# Patient Record
Sex: Female | Born: 1940 | Race: White | Hispanic: No | Marital: Married | Smoking: Never smoker
Health system: Southern US, Community
[De-identification: ages and names within clinical notes are randomized; demographics above are authoritative.]

## PROBLEM LIST (undated history)

## (undated) DIAGNOSIS — I10 Essential (primary) hypertension: Secondary | ICD-10-CM

## (undated) DIAGNOSIS — E119 Type 2 diabetes mellitus without complications: Secondary | ICD-10-CM

## (undated) DIAGNOSIS — M75100 Unspecified rotator cuff tear or rupture of unspecified shoulder, not specified as traumatic: Secondary | ICD-10-CM

## (undated) DIAGNOSIS — M1990 Unspecified osteoarthritis, unspecified site: Secondary | ICD-10-CM

## (undated) DIAGNOSIS — I350 Nonrheumatic aortic (valve) stenosis: Secondary | ICD-10-CM

## (undated) DIAGNOSIS — J449 Chronic obstructive pulmonary disease, unspecified: Secondary | ICD-10-CM

## (undated) DIAGNOSIS — IMO0002 Reserved for concepts with insufficient information to code with codable children: Secondary | ICD-10-CM

## (undated) DIAGNOSIS — K8020 Calculus of gallbladder without cholecystitis without obstruction: Secondary | ICD-10-CM

## (undated) DIAGNOSIS — E785 Hyperlipidemia, unspecified: Secondary | ICD-10-CM

## (undated) DIAGNOSIS — M48061 Spinal stenosis, lumbar region without neurogenic claudication: Secondary | ICD-10-CM

## (undated) DIAGNOSIS — G5601 Carpal tunnel syndrome, right upper limb: Secondary | ICD-10-CM

## (undated) DIAGNOSIS — L309 Dermatitis, unspecified: Secondary | ICD-10-CM

## (undated) DIAGNOSIS — I639 Cerebral infarction, unspecified: Secondary | ICD-10-CM

## (undated) HISTORY — DX: Type 2 diabetes mellitus without complications: E11.9

## (undated) HISTORY — DX: Essential (primary) hypertension: I10

## (undated) HISTORY — PX: TUBAL LIGATION: SHX77

## (undated) HISTORY — DX: Nonrheumatic aortic (valve) stenosis: I35.0

## (undated) HISTORY — PX: BACK SURGERY: SHX140

## (undated) HISTORY — DX: Unspecified osteoarthritis, unspecified site: M19.90

## (undated) HISTORY — DX: Hyperlipidemia, unspecified: E78.5

## (undated) HISTORY — DX: Unspecified rotator cuff tear or rupture of unspecified shoulder, not specified as traumatic: M75.100

## (undated) HISTORY — PX: JOINT REPLACEMENT: SHX530

## (undated) HISTORY — DX: Reserved for concepts with insufficient information to code with codable children: IMO0002

---

## 2002-07-07 ENCOUNTER — Encounter: Payer: Self-pay | Admitting: Specialist

## 2002-07-12 ENCOUNTER — Observation Stay (HOSPITAL_COMMUNITY): Admission: RE | Admit: 2002-07-12 | Discharge: 2002-07-13 | Payer: Self-pay | Admitting: Specialist

## 2002-09-21 ENCOUNTER — Inpatient Hospital Stay (HOSPITAL_COMMUNITY): Admission: RE | Admit: 2002-09-21 | Discharge: 2002-09-22 | Payer: Self-pay | Admitting: Specialist

## 2007-02-26 ENCOUNTER — Ambulatory Visit (HOSPITAL_COMMUNITY): Admission: RE | Admit: 2007-02-26 | Discharge: 2007-02-26 | Payer: Self-pay | Admitting: Ophthalmology

## 2007-03-10 ENCOUNTER — Ambulatory Visit: Payer: Self-pay | Admitting: Cardiology

## 2007-05-14 ENCOUNTER — Ambulatory Visit: Payer: Self-pay | Admitting: Cardiology

## 2007-05-25 ENCOUNTER — Ambulatory Visit: Payer: Self-pay | Admitting: Cardiology

## 2007-11-04 ENCOUNTER — Ambulatory Visit: Payer: Self-pay | Admitting: Vascular Surgery

## 2007-11-13 ENCOUNTER — Ambulatory Visit (HOSPITAL_COMMUNITY): Admission: RE | Admit: 2007-11-13 | Discharge: 2007-11-13 | Payer: Self-pay | Admitting: Rheumatology

## 2007-12-28 ENCOUNTER — Ambulatory Visit: Payer: Self-pay | Admitting: Cardiology

## 2007-12-31 ENCOUNTER — Encounter: Admission: RE | Admit: 2007-12-31 | Discharge: 2007-12-31 | Payer: Self-pay | Admitting: Rheumatology

## 2008-01-18 ENCOUNTER — Encounter: Admission: RE | Admit: 2008-01-18 | Discharge: 2008-01-18 | Payer: Self-pay | Admitting: Rheumatology

## 2008-02-03 ENCOUNTER — Encounter: Admission: RE | Admit: 2008-02-03 | Discharge: 2008-02-03 | Payer: Self-pay | Admitting: Rheumatology

## 2008-04-14 ENCOUNTER — Encounter: Payer: Self-pay | Admitting: Pulmonary Disease

## 2008-04-26 ENCOUNTER — Encounter: Admission: RE | Admit: 2008-04-26 | Discharge: 2008-04-26 | Payer: Self-pay | Admitting: Neurosurgery

## 2008-07-19 ENCOUNTER — Encounter: Payer: Self-pay | Admitting: Pulmonary Disease

## 2008-08-05 ENCOUNTER — Ambulatory Visit: Payer: Self-pay | Admitting: Cardiology

## 2008-08-05 ENCOUNTER — Encounter: Payer: Self-pay | Admitting: Physician Assistant

## 2008-08-05 ENCOUNTER — Ambulatory Visit (HOSPITAL_COMMUNITY): Admission: RE | Admit: 2008-08-05 | Discharge: 2008-08-05 | Payer: Self-pay | Admitting: Cardiology

## 2008-08-12 ENCOUNTER — Inpatient Hospital Stay (HOSPITAL_BASED_OUTPATIENT_CLINIC_OR_DEPARTMENT_OTHER): Admission: RE | Admit: 2008-08-12 | Discharge: 2008-08-12 | Payer: Self-pay | Admitting: Cardiovascular Disease

## 2008-08-12 ENCOUNTER — Ambulatory Visit: Payer: Self-pay | Admitting: Cardiovascular Disease

## 2008-08-29 ENCOUNTER — Ambulatory Visit: Payer: Self-pay | Admitting: Cardiology

## 2008-09-22 ENCOUNTER — Ambulatory Visit (HOSPITAL_COMMUNITY): Admission: RE | Admit: 2008-09-22 | Discharge: 2008-09-22 | Payer: Self-pay | Admitting: Ophthalmology

## 2008-09-23 ENCOUNTER — Ambulatory Visit: Payer: Self-pay | Admitting: Pulmonary Disease

## 2008-09-23 DIAGNOSIS — R0602 Shortness of breath: Secondary | ICD-10-CM

## 2008-09-26 ENCOUNTER — Ambulatory Visit: Payer: Self-pay | Admitting: Cardiovascular Disease

## 2008-09-26 DIAGNOSIS — C801 Malignant (primary) neoplasm, unspecified: Secondary | ICD-10-CM

## 2008-09-26 DIAGNOSIS — M159 Polyosteoarthritis, unspecified: Secondary | ICD-10-CM | POA: Insufficient documentation

## 2008-09-26 DIAGNOSIS — M719 Bursopathy, unspecified: Secondary | ICD-10-CM

## 2008-09-26 DIAGNOSIS — C4492 Squamous cell carcinoma of skin, unspecified: Secondary | ICD-10-CM | POA: Insufficient documentation

## 2008-09-26 DIAGNOSIS — M1990 Unspecified osteoarthritis, unspecified site: Secondary | ICD-10-CM | POA: Insufficient documentation

## 2008-09-26 DIAGNOSIS — I10 Essential (primary) hypertension: Secondary | ICD-10-CM | POA: Insufficient documentation

## 2008-09-26 DIAGNOSIS — E785 Hyperlipidemia, unspecified: Secondary | ICD-10-CM

## 2008-09-26 DIAGNOSIS — M67919 Unspecified disorder of synovium and tendon, unspecified shoulder: Secondary | ICD-10-CM | POA: Insufficient documentation

## 2008-09-26 DIAGNOSIS — R7309 Other abnormal glucose: Secondary | ICD-10-CM

## 2008-09-27 ENCOUNTER — Telehealth (INDEPENDENT_AMBULATORY_CARE_PROVIDER_SITE_OTHER): Payer: Self-pay | Admitting: *Deleted

## 2008-11-01 ENCOUNTER — Ambulatory Visit: Payer: Self-pay | Admitting: Pulmonary Disease

## 2008-11-30 ENCOUNTER — Encounter: Payer: Self-pay | Admitting: Physician Assistant

## 2009-01-11 ENCOUNTER — Encounter (HOSPITAL_COMMUNITY): Admission: RE | Admit: 2009-01-11 | Discharge: 2009-03-09 | Payer: Self-pay | Admitting: Orthopedic Surgery

## 2009-06-10 HISTORY — PX: CATARACT EXTRACTION: SUR2

## 2009-08-08 ENCOUNTER — Ambulatory Visit: Payer: Self-pay | Admitting: Cardiology

## 2009-08-09 ENCOUNTER — Inpatient Hospital Stay (HOSPITAL_COMMUNITY): Admission: RE | Admit: 2009-08-09 | Discharge: 2009-08-12 | Payer: Self-pay | Admitting: Orthopedic Surgery

## 2010-01-24 ENCOUNTER — Ambulatory Visit (HOSPITAL_COMMUNITY): Admission: RE | Admit: 2010-01-24 | Discharge: 2010-01-24 | Payer: Self-pay | Admitting: Orthopedic Surgery

## 2010-07-19 ENCOUNTER — Other Ambulatory Visit (HOSPITAL_COMMUNITY): Payer: Self-pay | Admitting: Orthopedic Surgery

## 2010-07-19 DIAGNOSIS — M25561 Pain in right knee: Secondary | ICD-10-CM

## 2010-07-30 ENCOUNTER — Ambulatory Visit (HOSPITAL_COMMUNITY)
Admission: RE | Admit: 2010-07-30 | Discharge: 2010-07-30 | Disposition: A | Discharge status: Home / Self Care | Payer: Medicare Other | Source: Ambulatory Visit | Attending: Orthopedic Surgery | Admitting: Orthopedic Surgery

## 2010-07-30 DIAGNOSIS — M25569 Pain in unspecified knee: Secondary | ICD-10-CM | POA: Insufficient documentation

## 2010-07-30 DIAGNOSIS — M25561 Pain in right knee: Secondary | ICD-10-CM

## 2010-07-30 DIAGNOSIS — Z96659 Presence of unspecified artificial knee joint: Secondary | ICD-10-CM | POA: Insufficient documentation

## 2010-07-30 MED ORDER — TECHNETIUM TC 99M MEDRONATE IV KIT
25.0000 | PACK | Freq: Once | INTRAVENOUS | Status: AC | PRN
  Administered 2010-07-30: 25 via INTRAVENOUS

## 2010-08-29 LAB — PROTIME-INR: Prothrombin Time: 13.9 seconds (ref 11.6–15.2)

## 2010-08-29 LAB — CBC
HCT: 39.2 % (ref 36.0–46.0)
Hemoglobin: 12.9 g/dL (ref 12.0–15.0)
MCHC: 32.9 g/dL (ref 30.0–36.0)
MCV: 90.2 fL (ref 78.0–100.0)
RBC: 4.35 MIL/uL (ref 3.87–5.11)
WBC: 4.9 10*3/uL (ref 4.0–10.5)

## 2010-08-29 LAB — URINALYSIS, ROUTINE W REFLEX MICROSCOPIC
Bilirubin Urine: NEGATIVE
Hgb urine dipstick: NEGATIVE
Ketones, ur: NEGATIVE mg/dL
Nitrite: NEGATIVE
Protein, ur: NEGATIVE mg/dL
Specific Gravity, Urine: 1.023 (ref 1.005–1.030)
Urobilinogen, UA: 0.2 mg/dL (ref 0.0–1.0)

## 2010-08-29 LAB — COMPREHENSIVE METABOLIC PANEL
AST: 25 U/L (ref 0–37)
CO2: 26 mEq/L (ref 19–32)
Calcium: 9.3 mg/dL (ref 8.4–10.5)
Chloride: 106 mEq/L (ref 96–112)
Creatinine, Ser: 0.83 mg/dL (ref 0.4–1.2)
GFR calc non Af Amer: >60 mL/min (ref >60)
Glucose, Bld: 113 mg/dL — ABNORMAL HIGH (ref 70–99)
Total Bilirubin: 0.6 mg/dL (ref 0.3–1.2)

## 2010-08-29 LAB — TYPE AND SCREEN: ABO/RH(D): A NEG

## 2010-08-29 LAB — ABO/RH: ABO/RH(D): A NEG

## 2010-08-29 LAB — APTT: aPTT: 27 seconds (ref 24–37)

## 2010-09-02 LAB — BASIC METABOLIC PANEL
BUN: 7 mg/dL (ref 6–23)
CO2: 29 mEq/L (ref 19–32)
CO2: 30 mEq/L (ref 19–32)
Chloride: 104 mEq/L (ref 96–112)
Chloride: 106 mEq/L (ref 96–112)
Creatinine, Ser: 0.81 mg/dL (ref 0.4–1.2)
GFR calc Af Amer: >60 mL/min (ref >60)
Glucose, Bld: 165 mg/dL — ABNORMAL HIGH (ref 70–99)
Potassium: 3.5 mEq/L (ref 3.5–5.1)
Potassium: 3.9 mEq/L (ref 3.5–5.1)
Sodium: 139 mEq/L (ref 135–145)

## 2010-09-02 LAB — CBC
HCT: 25.1 % — ABNORMAL LOW (ref 36.0–46.0)
HCT: 27.6 % — ABNORMAL LOW (ref 36.0–46.0)
HCT: 29.5 % — ABNORMAL LOW (ref 36.0–46.0)
Hemoglobin: 9.1 g/dL — ABNORMAL LOW (ref 12.0–15.0)
MCHC: 32.7 g/dL (ref 30.0–36.0)
MCHC: 33 g/dL (ref 30.0–36.0)
MCHC: 33.1 g/dL (ref 30.0–36.0)
MCV: 89.5 fL (ref 78.0–100.0)
MCV: 89.6 fL (ref 78.0–100.0)
MCV: 91.9 fL (ref 78.0–100.0)
Platelets: 123 10*3/uL — ABNORMAL LOW (ref 150–400)
Platelets: 124 10*3/uL — ABNORMAL LOW (ref 150–400)
RBC: 3.3 MIL/uL — ABNORMAL LOW (ref 3.87–5.11)
RDW: 14.8 % (ref 11.5–15.5)
RDW: 14.8 % (ref 11.5–15.5)
WBC: 5.8 10*3/uL (ref 4.0–10.5)
WBC: 6.2 10*3/uL (ref 4.0–10.5)

## 2010-09-02 LAB — PROTIME-INR: INR: 1.71 — ABNORMAL HIGH (ref 0.00–1.49)

## 2010-09-19 LAB — BASIC METABOLIC PANEL
BUN: 9 mg/dL (ref 6–23)
Chloride: 107 mEq/L (ref 96–112)
Creatinine, Ser: 0.8 mg/dL (ref 0.4–1.2)

## 2010-09-19 LAB — HEMOGLOBIN AND HEMATOCRIT, BLOOD: Hemoglobin: 12.7 g/dL (ref 12.0–15.0)

## 2010-09-20 LAB — POCT I-STAT 3, VENOUS BLOOD GAS (G3P V)
Acid-Base Excess: 2 mmol/L (ref 0.0–2.0)
Acid-Base Excess: 2 mmol/L (ref 0.0–2.0)
Bicarbonate: 26.6 mEq/L — ABNORMAL HIGH (ref 20.0–24.0)
O2 Saturation: 73 %
O2 Saturation: 75 %
TCO2: 27 mmol/L (ref 0–100)
pH, Ven: 7.425 — ABNORMAL HIGH (ref 7.250–7.300)
pO2, Ven: 38 mmHg (ref 30.0–45.0)

## 2010-09-20 LAB — POCT I-STAT 3, ART BLOOD GAS (G3+): Acid-Base Excess: 2 mmol/L (ref 0.0–2.0)

## 2010-10-23 NOTE — Assessment & Plan Note (Signed)
[facility]                       [place] CARDIOLOGY OFFICE NOTE   NAME:Ferdinand, LENORA GOMES                       MRN:          [id number]  DATE:08/05/2008                            DOB:          04/25/1941    Cardiologist previously was Dr. Lewayne Bunting.  She is now established with  Dr. Daleen Squibb.   REASON FOR VISIT:  Dyspnea.   PRIMARY CARE PHYSICIAN:  Dr. Olena Leatherwood.   HISTORY OF PRESENT ILLNESS:  Ms. Rahl is a 70 year old female patient  whom I saw in our [facility] back in December 2008 when she presented  for further evaluation of shortness of breath with exertion.  She had  had a stress test done at [facility] prior to that visit that  demonstrated probably normal LV perfusion with small completely  reversible apical defect associated normal wall motion consistent with  attenuation artifact.  A very small amount of ischemia could not be  ruled out.  At that point, Dr. Andee Lineman recommended proceeding with  elective cardiac catheterization.  However, the patient did not have  insurance at that time and did not want to proceed with this route.  She  was supposed to be seen back in follow-up in several weeks but actually  did not come back in follow-up until July 2009, when she saw Dr.  Diona Browner in the [facility].  He discussed with her the possibility of  proceeding with cardiac catheterization at that time.  However, she  declined.  It should be noted the patient has had pulmonary function  tests that demonstrate possible COPD.  She is actually seeing  Dr.  Cherie Ouch in [place] who evaluated her for COPD.  She has never  been a smoker.  She did work in a Orthoptist for many years.  He noted  that she has mild obstructive lung disease, probably overestimated based  on her spirometry done previously.  The patient tells me that Dr.  Orson Aloe has actually told her in the past that she does not really  have COPD.  He has given her a p.r.n.  inhaler that sounds as though she  is describing albuterol.   She has decided to switch to our [place] clinic due to difficulty in  scheduling appointments in [place].  Today, she notes continued dyspnea  with exertion.  She is getting frustrated by this.  She cannot do  anything without shortness of breath.  She notes NYHA class IIB to III  symptoms.  She sleeps on 2 pillows.  She has done this for many years.  It is uncertain whether not she is describing orthopnea.  She denies any  paroxysmal nocturnal dyspnea.  She has a long history venous stasis  disease.  She had been told in the past that she needs vein stripping.  Her lower extremity edema is stable.  She denies syncope.  She  specifically denies any chest discomfort.  She recently had a chest x-  ray ordered by her primary care physician in [place] that demonstrated an  area of patchy infiltrate which appeared to be new involving the right  lung base on the PA view on July 19, 2008.  The patient says that she  has been on a couple rounds of antibiotics over the last several months  for bronchitis.  She is not certain if she actually did take antibiotics  for the finding on her chest x-ray in early February.  Her last set of  pulmonary function tests done in November 2009 demonstrated mild airflow  obstruction with moderate decrease in DLCO.   PAST MEDICAL HISTORY:  Is as outlined above.  In addition she has:  1. Hypertension.  2. Borderline diabetes mellitus.  3. Dyslipidemia.  She has been intolerant to statins in the past and      has declined therapy.  4. Osteoarthritis, status post partial left knee replacement, total      knee replacement on the right.  5. Degenerative disk disease.  6. History of cataract surgery.  7. History of rotator cuff syndrome bilaterally.  8. Squamous cell carcinoma recently diagnosed by a dermatologist in      [place] on her bilateral lower extremities, treated with excision.   MEDICATIONS:   Fish oil daily, Garlic daily, Vitamin C, Vitamin E,  Vitamin D, Aspirin 81 mg daily, Labetalol 300 mg b.i.d., Prempro 0.25 mg  daily, Quinapril/HCTZ 20/12.5 mg b.i.d.,  Zocor 10mg  - she notes that she takes this only occasionally secondary  to myalgias, Multivitamin daily, Oxycodone p.r.n.   ALLERGIES:  No known drug allergies, except she thinks she had some type  of reaction to an anesthetic during a surgery some years ago.   SOCIAL HISTORY:  She denies any tobacco abuse.   FAMILY HISTORY:  Significant for CAD.  Mother had an MI in her 50s.  Father had an MI in his 67s.  One brother died from an MI at age 62 and  2 other brothers both had MIs in their 33s.   REVIEW OF SYSTEMS:  Please see HPI.  She denies any fevers or chills.  Her cough was initially productive of greenish sputum.  No hemoptysis.  Her cough has been clearing.  She denies melena, hematochezia,  hematuria, dysuria, dysphagia.  Rest of the review of systems are  negative.   PHYSICAL EXAMINATION:  She is a well-nourished, well-developed female no  in distress.  Blood pressure is 130/84, pulse 75, weight 198 pounds.  HEENT:  Normal.  NECK:  Without JVD, without lymphadenopathy.  Carotids without bruits  bilaterally.  CARDIAC:  Normal S1, S2.  Regular rate and rhythm, 1/6 systolic ejection  murmur across right sternal border.  LUNGS:  Clear to auscultation bilaterally soft, nontender.  EXTREMITIES:  Without edema.  NEUROLOGIC:  She is alert and oriented x3.  Cranial nerves II-XII  grossly intact.  SKIN:  Warm and dry.   Electrocardiogram reveals sinus rhythm, rate of 75 normal axis,  nonspecific ST-T wave changes.   DATABASE:  1. Adenosine Cardiolite performed at [facility] March 10, 2007 as outlined above.  2. Echocardiogram performed May 25, 2007 at [facility]      demonstrated mild concentric LVH, LV wall motion contractility      within normal limits, EF 60-65%, mild tricuspid  regurgitation.   ASSESSMENT AND PLAN:  1. Dyspnea on exertion.  This is likely multifactorial.  The patient      has some mild degree of chronic obstructive pulmonary disease.  She      also has some degree of deconditioning secondary to degenerative  disk disease and osteoarthritis.  However, she has had a stress      test in the past with some mild abnormalities and a small amount of      ischemia could not be ruled out.  She certainly has significant      risk factors for coronary artery disease.  Cardiac catheterization      has been recommended to her in the past, but she has declined.  She      is now willing to proceed with cardiac catheterization.  I have      discussed this further with Dr. Daleen Squibb, who agreed with proceeding      with cardiac catheterization.  Therefore, she will be set up for      outpatient cardiac catheterization next week.  We will have her      perform right and left heart catheterization to assess her right      heart pressures and assess for pulmonary hypertension as a cause of      her shortness of breath.  We will obtain fasting lipids with her      blood work and adjust her lipid-lowering medication if needed.  We      will also add a BNP to help assess her shortness of breath.  The      patient will have a follow-up chest x-ray.  If this continues to      demonstrate a right lower lobe infiltrate, she will be treated for      pneumonia prior to proceeding with cardiac catheterization.  2. Hypertension.  This seems to be fairly well controlled.  She will      continue on her above medications.  3. Borderline diabetes mellitus.  She will to follow up with Dr.      Olena Leatherwood.  4. Dyslipidemia.  As outlined above.  We will recheck lipids and make      further adjustments as necessary.  We may need to try something      like pravastatin or Crestor as she is having trouble tolerating      Zocor.   DISPOSITION:  The patient will follow up  postcatheterization.  If her  chest x-ray does demonstrate pneumonia, this will be treated and we will  see her back in follow-up prior to proceeding with cardiac  catheterization.      Tereso Newcomer, PA-C  Electronically Signed      Jesse Sans. Daleen Squibb, MD, [facility]  Electronically Signed   SW/MedQ  DD: 08/05/2008  DT: 08/05/2008  Job #: [id number]   cc:   Lia Hopping

## 2010-10-23 NOTE — Assessment & Plan Note (Signed)
[facility] HEALTHCARE                          [place] CARDIOLOGY OFFICE NOTE   NAME:[name], [name]                       MRN:          [id number]  DATE:05/14/2007                            DOB:          [birth year]    REFERRING PHYSICIAN:  Annette Stable Hasanaj   REASON FOR CONSULTATION:  Shortness of breath.   HISTORY OF PRESENT ILLNESS:  [name] is a 70 year old female patient  who has no history of coronary artery disease, who presents to the  office today for further evaluation of shortness of breath.  The patient  began to notice dyspnea with exertion about 5 months ago.  Prior to  this, she had not experienced any of these symptoms.  Her shortness of  breath is not severe with activity.  It is mainly just a noticeable  symptom that she has never had before.  She denies any associated chest  pain, heaviness, or tightness.  She denies any arm or jaw discomfort.  She denies any associated nausea or diaphoresis.  She denies any syncope  or near-syncope.  She describes NYHA class II to class IIB symptoms.  She denies orthopnea or PND.  She has chronic pedal edema and actually  has stasis dermatitis.  She has seen a dermatologist in the past and has  had an Radio broadcast assistant applied previously.  She has been worked up with her  shortness of breath by her previous primary care physician, Dr. Dimas Aguas.  An adenosine Cardiolite study was performed September 2008, which showed  probable normal LV perfusion with a small completely reversible apical  defect, associated with normal wall motion, consistent with attenuation  artifact, although a very small amount of ischemia could not be ruled  out.  The patient also had pulmonary function testing completed in  October 2008 that revealed Gold stage II COPD with a paroxysmal response  to single-dose aerolized bronchodilator.   PAST MEDICAL HISTORY:  1. Hypertension.  2. Borderline diabetes mellitus.  3. Hyperlipidemia.  The patient  has been prescribed Lipitor in the      past, but has not started on this secondary to concerns over liver      problems related to the medication.  4. COPD.  5. Osteoarthritis.      a.     History of a partial left knee replacement and total knee       replacement on the right.  6. History of cataract surgery recently.  7. History of rotator cuff syndrome bilaterally.  8. Venous insufficiency with stasis dermatitis.   ALLERGIES:  She had some type of reaction to an anesthetic some years  ago when she had her knee surgery.   CURRENT MEDICATIONS:  1. Vaseretic unknown dosage daily.  2. Atenolol 50 mg daily.  3. Aspirin 325 mg daily.  4. Fish oil.  5. Garlic.  6. Arthrotec unknown dosage.  7. Vitamin C.  8. Vitamin E.  9. B12.  10.Inhaler p.r.n.   SOCIAL HISTORY:  The patient denies any history of tobacco or alcohol  abuse.  She is married and has 3 children.  Her  husband has actually  seen Dr. Ladona Ridgel in the past secondary to an arrhythmia and has undergone  ablation therapy.  She is retired.  She worked 25 years in the  [employer].  She also worked Theatre manager for 18  years.   FAMILY HISTORY:  Significant for coronary artery disease.  Her mother  had an MI in her 65s and eventually died at 84 from a myocardial  infarction.  Her father died in his mid 16s from a myocardial  infarction.  She has 1 brother who died at age 63 from a myocardial  infarction, and 2 other brothers who both had Mis in their 62s.  One  brother is now in his 96s with significant congestive heart failure.   REVIEW OF SYSTEMS:  Please see HPI.  She denies fevers or chills.  She  has had a nonproductive cough.  Denies hemoptysis.  Denies dysphagia  sensation.  Denies any melena or hematochezia.  Denies any dysuria or  hematuria.  Denies any monocular blindness, unilateral weakness,  difficulty with speech.  The rest of the review of systems are negative.   PHYSICAL EXAM:  She is a  well-nourished, well-developed female in no  acute distress.  Blood pressure 153/91, pulse 74, weight 209.2 pounds.  HEENT:  Normal.  NECK:  Without JVD.  CARDIAC:  Normal S1 and S2.  Regular rate and rhythm with a 2/6 systolic  ejection murmur heard best at the right upper sternal border.  LUNGS:  Clear to auscultation bilaterally without wheeze, rales, or  rhonchi.  ABDOMEN:  Soft and nontender.  EXTREMITIES:  With 1+ edema bilaterally with chronic stasis dermatitis.  Calves soft and nontender.  SKIN:  Warm and dry.  NEUROLOGIC:  She is alert and oriented x3.  Cranial nerves 2-12 are  grossly intact.  VASCULAR:  Without carotid bruits bilaterally.  No femoral artery bruits  noted bilaterally.  ENDOCRINE:  Without thyromegaly.  LYMPHATICS:  Without lymphadenopathy.   ELECTROCARDIOGRAM:  Reveals sinus rhythm with a heart rate of 75, normal  axis, and nonspecific ST-T wave changes.   IMPRESSION:  1. Dyspnea with exertion.      a.     Equivocal adenosine Cardiolite study September 2008.      b.     Recent diagnosis of Gold stage II chronic obstructive       pulmonary disease.      c.     Rule out anginal equivalent.  2. Systolic murmur.  3. Hypertension.  4. Borderline diabetes mellitus.  5. Hyperlipidemia.      a.     Noncompliant with medical therapy secondary to concerns over       side effects.  6. Strong family history of cad.  7. Venous insufficiency.  8. Obesity.  9. Osteoarthritis.   PLAN:  The patient presents to the office today for further evaluation  of dyspnea on exertion.  Her dyspnea with exertion could be  multifactorial.  She does, however, have significant cardiac risk  factors, including a strong family history, as well as hypertension and  borderline diabetes mellitus.  Her stress test was equivocal.  I had a  long discussion with the patient regarding options, including proceeding  with cardiac catheterization.  She was also interviewed and examined  by  Dr. Andee Lineman today.  My recommendation at this point in time is to proceed  with elective cardiac catheterization.  However, the patient does not  have insurance at this time and would  like to wait until she is able to  use Medicare, which will be March 2009.  Her symptoms are stable at this  point.  Therefore, we plan to:  1. Proceed with medical therapy.  Will initiate Norvasc 5 mg daily for      blood pressure control and as an antianginal.  2. Continue aspirin 325 mg daily.  3. I have given her a prescription for nitroglycerin for recurrent      chest pain.  4. She will need an echocardiogram to further assess her systolic      murmur.  If she has significant valvular abnormalities or left      ventricular dysfunction, she will need cardiac catheterization      urgently.  5. Will check a fasting lipid panel for risk factor modification.  6. Will also set her up for a chest x-ray to rule out occult chest      pathology contributing to her symptoms.  7. She will return in 4 weeks for followup.  8. As noted above, we had a long discussion with the patient today.      It is certainly reasonable to hold off on cardiac catheterization      until it is more feasible for her from a financial standpoint.  Her      symptoms are certainly stable at this point in time, and we will      try an initial trial of medical therapy.  Our plan is to proceed      with cardiac catheterization in the future, once the patient is      able.  She knows that she will require urgent cardiac      catheterization if      she has an abnormal echocardiogram or a change in her symptoms, or      worsening symptoms.      Tereso Newcomer, PA-C  Electronically Signed      Learta Codding, MD,FACC  Electronically Signed   SW/MedQ  DD: 05/14/2007  DT: 05/14/2007  Job #: [id number]   cc:   Lia Hopping

## 2010-10-23 NOTE — Procedures (Signed)
LOWER EXTREMITY VENOUS REFLUX EXAM   INDICATION:  Bilateral lower extremity vascular stasis ulcers which  started following bilateral partial knee replacements in 2003.   EXAM:  Using color-flow imaging and pulse Doppler spectral analysis, the  right and left common femoral, superficial femoral, popliteal, posterior  tibial, greater and lesser saphenous veins are evaluated.  There is no  evidence suggesting deep venous insufficiency in the right or left lower  extremity.   The right and left saphenofemoral junctions are not competent.  The  right and left anterior branches of the greater saphenous veins are not  competent with the caliber as described below.  Only a short portion of  the incompetent greater saphenous veins are within the fascia.  Within  15 cm of the saphenofemoral junction, the incompetent anterior branches  of the greater saphenous vein perforate the fascia superficially and  give rise to a network of tortuous varicosities.   The right proximal short saphenous vein demonstrates incompetency.   GSV Diameter (used if found to be incompetent only)                                            Right    Left  Proximal Greater Saphenous Vein           0.39 cm  0.48 cm  Proximal-to-mid-thigh                     0.36 cm  0.49 cm  Mid thigh                                 cm       cm  Mid-distal thigh                          cm       cm  Distal thigh                              cm       cm  Knee                                      cm       cm   IMPRESSION:  1. Right short saphenous vein measures from 0.60 cm to 0.38 cm from      the distal calf to the saphenopopliteal junction.  2. Right and left greater saphenous vein reflux is identified with the      caliber ranging from 0.39 cm to 0.36 cm from the saphenofemoral      junction to the mid thigh on the right and from 0.48 cm to 0.49 cm      from the saphenofemoral junction  to the mid thigh on the  left.  3. Right and left greater saphenous veins are not aneurysmal.  4. Right and left greater saphenous veins are tortuous.  5. The deep venous system is competent.  6. The right lesser saphenous vein is not competent.  7. Incompetent anterior branches of the greater saphenous vein      penetrate the fascia superficially and give rise to extensive  varicose veins 10-15 cm distal to the saphenofemoral junction      bilaterally.   ___________________________________________  Janetta Hora Fields, MD   MC/MEDQ  D:  11/04/2007  T:  11/04/2007  Job:  [id number]

## 2010-10-23 NOTE — Cardiovascular Report (Signed)
NAME:  [name], [name] NO.:  [id number]   MEDICAL RECORD NO.:  [id number]          PATIENT TYPE:  OIB   LOCATION:  1965                         FACILITY:  [facility]   PHYSICIAN:  Veverly Fells. Excell Seltzer, MD  DATE OF BIRTH:  1940/09/29   DATE OF PROCEDURE:  08/12/2008  DATE OF DISCHARGE:  08/12/2008                            CARDIAC CATHETERIZATION   PROCEDURES:  1. Right heart catheterization.  2. Left heart catheterization.  3. Selective coronary angiography.  4. Left ventricular angiography.   INDICATIONS:  Ms. Cheyney is a 70 year old woman with marked dyspnea.  She  has severe exertional dyspnea that is longstanding.  She has undergone  evaluation with a stress Myoview in the past that showed some mild  abnormalities and ischemia cannot be ruled out.  She was referred for  right and left heart catheterization to evaluate her hemodynamics and  coronary anatomy.   Risks and indications of the procedure were reviewed with the patient.  Informed consent was obtained.  Right groin was prepped, draped, and  anesthetized with 1% lidocaine using modified Seldinger technique.  A 4-  French sheaths was placed in the right femoral artery and a 6-French  sheath was placed in the right femoral vein.  An end-hole multipurpose  catheter was used for the right heart catheterization.  Standard 4-  French Judkins catheters were used for coronary angiography and left  ventriculography.  All catheter exchanges were performed over guidewire.  There were no immediate complications.   FINDINGS:  Left ventriculography:  Normal LV function with an LVEF  estimated at 65%.  There is no mitral regurgitation.   Coronary angiography, left mainstem:  Normal.  Bifurcates into the LAD  and left circumflex.   LAD:  Large-caliber vessel that courses down and reaches the LV apex.  The LAD supplies 3 diagonal branches all of which are normal.  There is  no obstructive disease in the LAD or diagonal  branches.   Left circumflex:  Left circumflex is large caliber courses down and  supplies 3 OM branches.  The second and third OM branches are large.  There is no significant obstructive disease throughout the left  circumflex system.   Right coronary artery:  The right coronary artery is dominant.  It is a  large vessel that courses down and supplies the PDA branch and  posterolateral branch.  There is minimal lumen irregularity proximally  with no significant stenosis.  The remaining portions of the vessel are  smooth with no significant stenosis throughout.   HEMODYNAMIC RESULTS:  Right atrial pressure mean of 7, right ventricular  pressure of 33/10, pulmonary artery pressure 33/14 with a mean of 23,  pulmonary capillary wedge pressures 12, left ventricular pressures  176/11, aortic pressure initially was 159/80 with a mean of 114.  The  patient had an irritable ventricle with the pigtail catheter in the LV,  so the gradient is false.  There was no transaortic valvular gradient  during the procedure.   Oxygen saturations:  Aortic pressure 96%, pulmonary artery 75%, superior  vena cava 73%.   Cardiac output  by the Fick method 6.3 L per minute.  Cardiac index 3.2 L  per minute per meter square.   ASSESSMENT:  1. Normal coronary arteries with the exception of minimal plaque in      the proximal right coronary artery.  2. Normal left ventricular function.  3. Normal hemodynamics.      Veverly Fells. Excell Seltzer, MD  Electronically Signed     MDC/MEDQ  D:  08/12/2008  T:  08/13/2008  Job:  [id number]   cc:   Leanor Kail Wall, MD, [facility]

## 2010-10-23 NOTE — Assessment & Plan Note (Signed)
[facility]                       [place] CARDIOLOGY OFFICE NOTE   NAME:DUNNSiria, Calandro                       MRN:          [id number]  DATE:08/29/2008                            DOB:          Mar 03, 1941    Ms. Osterloh comes in today for followup.  She has a long history of dyspnea  and was evaluated here in the office on August 12, 2008, by [facility]  and myself.   It had been recommended for a number of years for her to have a cardiac  catheterization which she finally consented to.  On August 12, 2008, she  had a right and left heart catheterization.  This showed EF is 65% with  no mitral regurgitation.  She had no significant coronary disease.  She  had some mild pulmonary hypertension by her right heart cath, but her  wedge was only 12.  She was hypertensive in the lab with a left  ventricular systolic pressure of 176.   She comes in today still complaining of her dyspnea.  She has mild COPD  by recent pulmonary function studies with a decreased DLCO.  She wonders  if she needs to see a pulmonologist in our group.  I told her I would be  happy to set it up.   She has multiple other reasons to be short of breath well outlined by  our note on August 05, 2008.   Her meds are unchanged since her last visit.  Please refer to the  previous note.   PHYSICAL EXAMINATION:  VITAL SIGNS:  Her blood pressure is 134/82, her  pulse is 64 and regular, her weight is 199.  HEENT:  Unremarkable.  NECK:  No JVD.  Carotids are full.  LUNGS:  Clear without rhonchi or wheezing.  HEART:  Regular rate and rhythm.  PMI was difficult to appreciate.  There is no gallop.  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  There is no sign of DVT.  Pulses are intact.  NEUROLOGIC:  Intact.   I have answered all Ms. Kwolek questions today.  I have arranged for her  to see [facility] at her request.   Reviewing her chart again, I see  no evidence of workup for pulmonary  embolus.  Because this is a chronic problem, I will not order a D-dimer  today.  However, Pulmonary may wish to pursue  this further.  She also may have had CT scans done in [place] with the  chronicity of this issue.  We will see her back on a p.r.n. basis from a  cardiovascular perspective.     Thomas C. Daleen Squibb, MD, [facility]  Electronically Signed    TCW/MedQ  DD: 08/29/2008  DT: 08/30/2008  Job #: [id number]   cc:   Lia Hopping

## 2010-10-23 NOTE — Assessment & Plan Note (Signed)
[facility]                          [place] CARDIOLOGY OFFICE NOTE   NAME:[name], [name]                       MRN:          [id number]  DATE:12/28/2007                            DOB:          [birth year]    PRIMARY CARE PHYSICIAN:  Dr. Lia Hopping   CARDIOLOGIST:  Learta Codding, MD, [facility].   REASON FOR VISIT:  Followup shortness of breath.   HISTORY OF PRESENT ILLNESS:  [name] was seen by Dr. Andee Lineman and Mr.  Alben Spittle back in December 2008.  Please see the extensive note for full  discussion of the patient's presentation and symptoms.  At that time she  was experiencing dyspnea on exertion that was felt to be potentially  multifactorial.  She did have cardiac risk factors including family  history, hypertension, and borderline diabetes mellitus.  She had  undergone a noninvasive stress test, which was fairly reassuring  demonstrating probable apical soft tissue attenuation with an ejection  fraction of 66%, although the possibility of a very small area of  ischemia could not be excluded.  Echocardiography demonstrated normal  left ventricular systolic function with an ejection fraction of 60-65%  and mild tricuspid regurgitation.  The possibility of a diagnostic  cardiac catheterization done electively was discussed, although the  patient did not have insurance at that time and she did not elect to  proceed.  The plan was for her to return and discuss the matter further  over the next month, although she has not been seen since then and comes  into the office today for a visit.   I do see that she has been diagnosed with moderate obstructive pulmonary  disease based on pulmonary function tests from December.  She had a  paradoxical response to albuterol inhaler.  She has also had lipids  obtained showing an LDL of 132.  She tells me that she was tried on  simvastatin, although did not tolerate this due to myalgias.  She  prefers to take garlic  supplements and red yeast rice extract.  Today,  we revisited the prior discussion regarding cardiac catheterization and  [name] actually states she has felt better in terms of her breathing.  She has NYHA class II dyspnea on exertion.  She has had no exertional  chest pain.  She has also been undergoing some evaluation for back pain  and leg pain related to osteoarthritis and spinal stenosis and has some  injections planned in the near future.  We reviewed the matter today and  discussed a referral to Dr. Orson Aloe for further investigation of her  lung status.  She is not a smoker, but does have a history of working in  [employer] with [employer], so I suspect some element of chronic  lung disease at baseline.  It may well be that her symptoms will improve  with further management of her blood pressure and a standing metered-  dose inhaler regimen.  We did talk about aspirin and the merits of  statin therapy for general risk factor reduction, although she did not  want to  start a statin at this time.   ALLERGIES:  Intolerance to ZOCOR due to myalgias.   PRESENT MEDICATIONS:  1. Vaseretic 20/12.5 mg p.o. daily.  2. Atenolol 50 mg p.o. daily.  3. Omega-3 supplements.  4. Garlic.  5. Red yeast rice extract.  6. She also uses hydrocodone and Excedrin ES for her back pain.   REVIEW OF SYSTEMS:  As described in the history of present illness.  Otherwise negative.   PHYSICAL EXAMINATION:  VITAL SIGNS:  Blood pressure is 155/89, heart  rate is 75, and weight is 202 pounds.  GENERAL:  An overweight woman and in no acute distress.  HEENT:  Conjunctivae is normal.  Oropharynx is clear.  NECK:  Supple.  No elevated jugular venous pressure.  No loud bruits or  thyromegaly.  LUNGS:  Clear although with diminished breath sounds throughout.  No  active wheezing noted.  CARDIAC:  Regular rate and rhythm.  A 2/6 murmur heard at the base.  Second heart sound is preserved.  No S3 gallop or  pericardial rub.  ABDOMEN:  Soft and nontender.  Normoactive bowel sounds.  EXTREMITIES:  Exhibit no frank pitting edema.   IMPRESSION AND RECOMMENDATIONS:  Dyspnea on exertion, likely  multifactorial.  I suspect she has an element of diastolic dysfunction  as well as what appears to be chronic lung disease, perhaps related to  long-term work in a Orth[employer].  She denies any long-standing tobacco  use history.  Her pulmonary function tests were consistent with moderate  obstructive lung disease with a paradoxical response to albuterol.  I  have asked her to start taking coated baby aspirin daily for general  risk factor reduction.  She is not interested in the statin at this  time, although we did discuss it.  I will also make a referral to Dr.  Orson Aloe for formal pulmonary consultation.  Some of her symptoms may  actually improve on a standing pulmonary medical regimen.  Otherwise, we  did revisit the issue of a diagnostic cardiac catheterization, although  at this point she would prefer observation and may in fact have  difficulty lying flat given her significant back pain problems.  We will  schedule to follow up with Dr. Andee Lineman over the next 3 months to see her  clinically and this can always be rediscussed depending on how she does.     Jonelle Sidle, MD  Electronically Signed    SGM/MedQ  DD: 12/28/2007  DT: 12/29/2007  Job #: [id number]   cc:   Learta Codding, MD,FACC  Lia Hopping

## 2010-10-23 NOTE — Assessment & Plan Note (Signed)
OFFICE VISIT   [name], [name]  DOB:  [birth year]                                       11/04/2007  UEAVW#:[id number]   The patient is a 70 year old female referred by [name] for bilateral  lower extremity edema and chronic ulcers.  Ever since 2002 she has had  chronic swelling in her legs.  This was followed by bilateral partial  knee replacements.  She has had chronic exacerbations of swelling and  ulcers in both lower extremities.  This has been going on ever since 2  weeks postop from 2002.  She denies any prior history of DVT.  She  thinks that her mother may have had varicose veins.  Otherwise her  family history is unremarkable.  She has been wearing compression  stockings intermittently during the last few years.  She states this has  helped significantly over the last 2 months.  She has been seeing [name].  Burman [name] for her wound care issues.  She had a right lower extremity duplex  exam performed at [facility] on April 14.  This showed no evidence of DVT.  She had bilateral ABIs also performed on April 14 at [facility] and these  were normal bilaterally.   Her atherosclerotic risk factors include hypertension, elevated  cholesterol.   PAST SURGICAL HISTORY:  Is remarkable for bilateral partial knee  replacements in 2002 by [name].  She then had total replacement of  one knee in 2007.   MEDICATIONS:  1. Include atenolol 50 mg once a day.  2. Accuretic 20 mg once a day.  3. Tylenol arthritis four a day.  4. Aspirin 325 mg once a day.  5. Simvastatin 40 mg once a day.  6. Advil p.r.n.  7. Multivitamin once a day.  8. Red yeast rice once a day.  9. Vitamin C 1000 mg once a day.  10.Calcium 600 mg once a day.  11.Fish oil 1200 mg once a day.  12.Garlic one tablet daily.   SOCIAL HISTORY:  She is married and has three children.  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  She is a  nonsmoker and not a consumer of  alcohol.   REVIEW OF SYSTEMS:  GENERAL:  She is 5 feet 5 inches, 200 pounds.  CARDIAC:  She has some shortness of breath with exertion and lying flat.  VASCULAR:  She has some pain in her legs on walking and nonhealing  ulcers as described above.  ORTHOPEDIC:  She has arthritis and multiple joint pains.  Pulmonary, GI, renal, neurologic, psychiatric, ENT and hematologic  review of systems are all negative.   PHYSICAL EXAM:  Vital signs:  Blood pressure is 154/97 in the left arm,  heart rate is 87 regular.  HEENT:  Is unremarkable.  Neck:  She has 2+  carotid pulses without bruit.  Chest:  Clear to auscultation.  Cardiac:  Exam is regular rate and rhythm without murmur.  Abdomen:  Is soft,  nontender, nondistended.  No pulsatile mass, slightly obese.  Extremities:  She has 2+ radial pulses bilaterally.  She has 1+ femoral,  1+ popliteal, 2+ dorsalis pedis and posterior tibial pulses bilaterally.  She has a diffuse macular rash with some erythema in the legs medially  on the calf bilaterally.  There is a 2 mm ulceration  which is  superficial in nature on the left medial calf.  She has a few small  varicosities visible in the left lateral calf.  These are 3-4 mm in  diameter.   She had a venous duplex exam today which showed several incompetent  anterior branches off of the greater saphenous vein bilaterally.  Saphenofemoral junction was incompetent bilaterally.  The right lesser  saphenous vein was also incompetent.  The anterior branches of the  greater saphenous vein were giving rise to extensive varicosities in the  thigh.   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  Burman [name] previously.  Certainly the incompetent saphenofemoral junction is  contributing to this bilaterally.  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  She currently is receiving   compression therapy and has achieved some relief with this.  I have  discussed with her today that the benefits of laser ablation versus  compression would be similar long term.  However, she may have some  added benefit and improvement of symptoms with laser ablation in  addition to compression.  However, I did emphasize to her that people  that have venous insufficiency will have chronic exacerbations and  remissions of their disease process.  I also did discuss with her that  even after venous ablation she would need to continue compression  therapy.  She wishes to think about whether or not to have laser  ablation done at this time.  She will call to schedule an appointment  with [name] or [name] if she wishes to have her saphenous vein  and lesser saphenous vein ablated at some point in the future.  Otherwise she will follow up on an as needed basis.  She will continue  to see [name] for her wound care issues.   Vanessa Hora. Fields, MD  Electronically Signed   CEF/MEDQ  D:  11/04/2007  T:  11/05/2007  Job:  [id number]   cc:   [name], [name]  Lia Hopping

## 2010-10-26 NOTE — Op Note (Signed)
NAME:  [name], [name]                          ACCOUNT NO.:  [id number]   MEDICAL RECORD NO.:  [id number]                   PATIENT TYPE:  AMB   LOCATION:  DAY                                  [facility]AC[facility]   PHYSICIAN:  Ronnell Guadalajara, M.D.                DATE OF BIRTH:  [birth year]   DATE OF PROCEDURE:  07/12/2002  DATE OF DISCHARGE:                                 OPERATIVE REPORT   SURGEON:  Deloris Ping. Montez Morita, M.D.   ASSISTANT:  Clarene Reamer, P.A.-C.   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   OPERATIVE PROCEDURE:  Repicci Unicondylar knee implant.   DESCRIPTION OF PROCEDURE:  After suitable general anesthesia, the leg was  prepped and draped routinely and put into an arthroscopy leg holder.  Before  the final drape is applied, the leg is exsanguinated and upper thigh  tourniquet inflated to 350 mmHg.  Routine draping is then continued.  An  anteromedial incision is made, beginning at the top level of the patella and  extending past the joint line.  Subcutaneous tissue is cleared from the  capsule which is then opened in a parapatellar manner, and a one inch  relaxing incision is made from lateral to medial at the top of the patella  into the vastus medialis to better reflect the medial capsule and synovium.  The capsule was removed from the patella, and the medial facet is removed  with a saw.  The meniscus is freed up anteriorly and held with a clamp, and  about 9 mm of distal femur is removed with a saw, after which the Steinmann  pin holes are placed in the center of the femoral condyle and then the tibia  at a 45 degree angle.  Distraction apparatus is applied and distracted and  rotated.  The meniscus is then removed under direct vision with the aid of a  headlight.  Posterior spur on the back of the femur is removed.   Then using  a bur, the femur is rounded, and the femoral spur is removed.  The tibia is  then burred to accept an implant.  It was felt that the smallest implant  would be acceptable, and that is the 29.6 mm x 6.5, and a good, stable fit  was achieved with reaming.  With the knee in flexion and extension, the  marks on the femur were noted.  The femur was then further rounded, and  about 3 mm of distal femur was removed with the bur.  A femoral guide was  tried, and it was felt that the smallest femoral component which was the 46  mm guide was a good fit.  A couple of drill holes were placed in it in the  blue line, and then it was used as  a gouge to better shape the femur.  Finally, the central drill hole was drilled.  Trial reduction was then  carried out as the femoral trial was inserted in first and then the tibia,  and excellent stability, excellent extension and flexion, and a good full  extension was felt to have been achieved.  The top of the femoral component  was marked.  Components were removed, and a bur was used to make sure there  was enough room at the top of the femoral component.  While the cement was  being mixed, a rolled up sponge was placed posteriorly as well as up in the  suprapatellar pouch.  Marcaine 25% with adrenalin was injected posteriorly  into the soft tissues, capsule.  Once the cement was mixed, the tibia was  put in first, then the femur.  Some of the cement removed off of the tibial  component and the posterior sponge pulled out as well as that in the  suprapatellar pouch and the remaining cement removed at the end.  There was  a real medial piece of femoral spur that must have come off after the  femoral guide had been used as a gouge, and that was extracted with  pituitaries and some soft dissection.  Following this, all cement having  been removed and cleared and looked quite good, the tissues were injected  with 70 mL total of 0.25% Marcaine with  adrenalin, and the wound was closed  routinely, closing the transverse incision that was made proximally and then  the capsule,  all with #1 PDS suture, 2-0 in the subcu, staples in the skin.  Compression  dressing.  The tourniquet was up right at two hours.  The toes pinked-up  well.  She went to recovery in good condition. The wound was closed over a  Hemovac drain.                                               Ronnell Guadalajara, M.D.    PC/MEDQ  D:  07/12/2002  T:  07/12/2002  Job:  [id number]

## 2010-10-26 NOTE — Op Note (Signed)
NAME:  [name], [name]                          ACCOUNT NO.:  [id number]   MEDICAL RECORD NO.:  [id number]                   PATIENT TYPE:  AMB   LOCATION:  DAY                                  FACILITY[facility]   PHYSICIAN:  Ronnell Guadalajara, M.D.                DATE OF BIRTH:  09-01-1940   DATE OF PROCEDURE:  09/20/2002  DATE OF DISCHARGE:                                 OPERATIVE REPORT   PREOPERATIVE DIAGNOSIS:  Degenerative arthritis, left knee, primarily in  medial compartment.   POSTOPERATIVE DIAGNOSIS:  Degenerative arthritis, left knee, primarily in  medial compartment.   PROCEDURE:  Rapezzi intercondylar knee replacement.   SURGEON:  Ronnell Guadalajara, M.D.   ASSISTANT:  Georges Lynch. Gioffre, M.D.   DESCRIPTION OF PROCEDURE:  After a suitable general anesthesia, the leg is  placed in an arthroscopy leg holder, making sure that it can be flexed  easily to 110 with some towels up underneath the foam padding and prepped  and draped routinely.  After exsanguination, upper thigh tourniquet is  inflated to 375 mmHg.  A medial parapatellar incision is made.  The capsule  is opened after clearing subcutaneous tissue.  The meniscus is freed up  through its anterior half.  The medial capsule was released from the tibia.  A one-inch incision is made at the level of the proximal patella, across the  vastus medialis, and the medial flap is grasped with two Kocher's and turned  downward.  The soft tissue is dissected off the top of the patella, and the  medial patellar facet is excised with a saw.  The patient is then positioned  90 degrees bent and the surgeon sitting, and a saw was used to remove 7 mm  off the bottom of the femur.  This was measured and measured at 7.  A  distracting apparatus has been put in and externally rotated, and a small  piece of femur is seen behind this, is removed with an osteotome and taken  away.  The front of the femur is rounded and then the rest of the  meniscus  is removed under direct visualization with the Bovie.  Then using the 6.5  bur to start with, which is the Christmas tree type, a rough impression is  made.  We then used the round bur.  She previously had the small tibia  inserted.  It looked like she would use the medium size, which is the 32 mm,  and the tibia was enlarged until it would take it and it was a good rim all  the way around.  The edges were trimmed with the straight line bur and a  nice fit was felt to have been achieved.  The area was marked where we would  like it to hit in flexion and extension on the femur, and a couple of  different femurs were tried.  I elected  to use the 48 mm, which seems to fit  quite good in both flexion and extension.  This was all done after an  additional 2 mm had been reamed from the surface of the femur.  It was  already thinned by the articular wear.  Drill holes and Steinmann pins were  placed in the 48 mm guide, and this was used to better contour the femur and  a central drill hole was made.  The holes were then connected with a saw and  a trial reduction was carried out with the trial prosthesis and the tibia.  It was thought at this time that maybe a 7.5 tibia would give a better fit,  and it did just fine with excellent stability in flexion and a little bit of  hyperextension.  Cement was then mixed.  The posterior area was injected  with 0.25% Marcaine with adrenalin.  A sponge was placed back there, a  sponge in the prepatellar pouch.  The tibia was cemented first and then the  femur.  The excess cement was removed.  It was brought up into extension and  held for five seconds or 10 seconds and then brought back and cleaned and  then held finally until it was fully  set.  Additional Marcaine to the tune of about 65 mL was put in, injecting  all tissues and a little bit into the joint end as well.  Routine wound  closure with subcuticular suture at the end in the skin.  A nice  compression  dressing.  Hemovac in place.  The patient goes to recovery in good  condition.                                               Ronnell Guadalajara, M.D.    PC/MEDQ  D:  09/20/2002  T:  09/20/2002  Job:  [id number]

## 2010-10-30 ENCOUNTER — Other Ambulatory Visit: Payer: Self-pay | Admitting: Orthopedic Surgery

## 2010-10-30 ENCOUNTER — Ambulatory Visit (HOSPITAL_COMMUNITY)
Admission: RE | Admit: 2010-10-30 | Discharge: 2010-10-30 | Disposition: A | Discharge status: Home / Self Care | Payer: Medicare Other | Source: Ambulatory Visit | Attending: Orthopedic Surgery | Admitting: Orthopedic Surgery

## 2010-10-30 ENCOUNTER — Encounter (HOSPITAL_COMMUNITY): Payer: Medicare Other

## 2010-10-30 ENCOUNTER — Other Ambulatory Visit (HOSPITAL_COMMUNITY): Payer: Self-pay | Admitting: Orthopedic Surgery

## 2010-10-30 DIAGNOSIS — Z01811 Encounter for preprocedural respiratory examination: Secondary | ICD-10-CM

## 2010-10-30 DIAGNOSIS — I10 Essential (primary) hypertension: Secondary | ICD-10-CM | POA: Insufficient documentation

## 2010-10-30 DIAGNOSIS — R059 Cough, unspecified: Secondary | ICD-10-CM | POA: Insufficient documentation

## 2010-10-30 DIAGNOSIS — Z01818 Encounter for other preprocedural examination: Secondary | ICD-10-CM | POA: Insufficient documentation

## 2010-10-30 DIAGNOSIS — Z01810 Encounter for preprocedural cardiovascular examination: Secondary | ICD-10-CM | POA: Insufficient documentation

## 2010-10-30 DIAGNOSIS — Z01812 Encounter for preprocedural laboratory examination: Secondary | ICD-10-CM | POA: Insufficient documentation

## 2010-10-30 DIAGNOSIS — R05 Cough: Secondary | ICD-10-CM | POA: Insufficient documentation

## 2010-10-30 LAB — PROTIME-INR: Prothrombin Time: 13.5 seconds (ref 11.6–15.2)

## 2010-10-30 LAB — COMPREHENSIVE METABOLIC PANEL
AST: 28 U/L (ref 0–37)
BUN: 15 mg/dL (ref 6–23)
CO2: 30 mEq/L (ref 19–32)
Calcium: 9.2 mg/dL (ref 8.4–10.5)
Creatinine, Ser: 0.71 mg/dL (ref 0.4–1.2)
GFR calc Af Amer: >60 mL/min (ref >60)
GFR calc non Af Amer: >60 mL/min (ref >60)

## 2010-10-30 LAB — CBC
Hemoglobin: 13 g/dL (ref 12.0–15.0)
RBC: 4.56 MIL/uL (ref 3.87–5.11)
WBC: 5.5 10*3/uL (ref 4.0–10.5)

## 2010-10-30 LAB — URINALYSIS, ROUTINE W REFLEX MICROSCOPIC
Glucose, UA: NEGATIVE mg/dL
Nitrite: NEGATIVE
Protein, ur: NEGATIVE mg/dL
pH: 7 (ref 5.0–8.0)

## 2010-10-30 LAB — SURGICAL PCR SCREEN: MRSA, PCR: NEGATIVE

## 2010-10-30 LAB — APTT: aPTT: 27 seconds (ref 24–37)

## 2010-11-07 ENCOUNTER — Inpatient Hospital Stay (HOSPITAL_COMMUNITY)
Admission: RE | Admit: 2010-11-07 | Discharge: 2010-11-10 | DRG: 468 | Disposition: A | Discharge status: Home Health | Payer: Medicare Other | Source: Ambulatory Visit | Attending: Orthopedic Surgery | Admitting: Orthopedic Surgery

## 2010-11-07 ENCOUNTER — Other Ambulatory Visit: Payer: Self-pay | Admitting: Orthopedic Surgery

## 2010-11-07 DIAGNOSIS — T84039A Mechanical loosening of unspecified internal prosthetic joint, initial encounter: Principal | ICD-10-CM | POA: Diagnosis present

## 2010-11-07 DIAGNOSIS — M51379 Other intervertebral disc degeneration, lumbosacral region without mention of lumbar back pain or lower extremity pain: Secondary | ICD-10-CM | POA: Diagnosis present

## 2010-11-07 DIAGNOSIS — Z96659 Presence of unspecified artificial knee joint: Secondary | ICD-10-CM

## 2010-11-07 DIAGNOSIS — Y831 Surgical operation with implant of artificial internal device as the cause of abnormal reaction of the patient, or of later complication, without mention of misadventure at the time of the procedure: Secondary | ICD-10-CM | POA: Diagnosis present

## 2010-11-07 DIAGNOSIS — M5137 Other intervertebral disc degeneration, lumbosacral region: Secondary | ICD-10-CM | POA: Diagnosis present

## 2010-11-07 DIAGNOSIS — E119 Type 2 diabetes mellitus without complications: Secondary | ICD-10-CM | POA: Diagnosis present

## 2010-11-07 DIAGNOSIS — Z01812 Encounter for preprocedural laboratory examination: Secondary | ICD-10-CM

## 2010-11-07 DIAGNOSIS — I10 Essential (primary) hypertension: Secondary | ICD-10-CM | POA: Diagnosis present

## 2010-11-07 DIAGNOSIS — J449 Chronic obstructive pulmonary disease, unspecified: Secondary | ICD-10-CM | POA: Diagnosis present

## 2010-11-07 DIAGNOSIS — E7800 Pure hypercholesterolemia, unspecified: Secondary | ICD-10-CM | POA: Diagnosis present

## 2010-11-07 DIAGNOSIS — D649 Anemia, unspecified: Secondary | ICD-10-CM | POA: Diagnosis not present

## 2010-11-07 DIAGNOSIS — J4489 Other specified chronic obstructive pulmonary disease: Secondary | ICD-10-CM | POA: Diagnosis present

## 2010-11-07 LAB — GRAM STAIN: Gram Stain: NONE SEEN

## 2010-11-07 LAB — TYPE AND SCREEN: ABO/RH(D): A NEG

## 2010-11-07 LAB — GLUCOSE, CAPILLARY: Glucose-Capillary: 95 mg/dL (ref 70–99)

## 2010-11-08 LAB — BASIC METABOLIC PANEL
BUN: 12 mg/dL (ref 6–23)
CO2: 29 mEq/L (ref 19–32)
Chloride: 102 mEq/L (ref 96–112)
Glucose, Bld: 153 mg/dL — ABNORMAL HIGH (ref 70–99)
Potassium: 3.9 mEq/L (ref 3.5–5.1)
Sodium: 137 mEq/L (ref 135–145)

## 2010-11-08 LAB — CBC
HCT: 29.9 % — ABNORMAL LOW (ref 36.0–46.0)
Hemoglobin: 9.7 g/dL — ABNORMAL LOW (ref 12.0–15.0)
MCV: 88.5 fL (ref 78.0–100.0)
RBC: 3.38 MIL/uL — ABNORMAL LOW (ref 3.87–5.11)
RDW: 14.2 % (ref 11.5–15.5)
WBC: 5.6 10*3/uL (ref 4.0–10.5)

## 2010-11-09 LAB — CBC
HCT: 29.5 % — ABNORMAL LOW (ref 36.0–46.0)
MCH: 28.8 pg (ref 26.0–34.0)
MCV: 88.6 fL (ref 78.0–100.0)
Platelets: 142 10*3/uL — ABNORMAL LOW (ref 150–400)
RDW: 14.3 % (ref 11.5–15.5)
WBC: 6.4 10*3/uL (ref 4.0–10.5)

## 2010-11-09 LAB — BASIC METABOLIC PANEL
BUN: 11 mg/dL (ref 6–23)
Creatinine, Ser: 0.7 mg/dL (ref 0.4–1.2)
GFR calc non Af Amer: >60 mL/min (ref >60)
Glucose, Bld: 136 mg/dL — ABNORMAL HIGH (ref 70–99)
Potassium: 3.8 mEq/L (ref 3.5–5.1)

## 2010-11-09 LAB — WOUND CULTURE: Gram Stain: NONE SEEN

## 2010-11-10 LAB — CBC
HCT: 26.9 % — ABNORMAL LOW (ref 36.0–46.0)
MCHC: 33.1 g/dL (ref 30.0–36.0)
MCV: 87.3 fL (ref 78.0–100.0)
Platelets: 149 10*3/uL — ABNORMAL LOW (ref 150–400)
RDW: 14.5 % (ref 11.5–15.5)
WBC: 5.7 10*3/uL (ref 4.0–10.5)

## 2010-11-10 NOTE — Op Note (Signed)
[name], [name] NO.:  [id number]  MEDICAL RECORD NO.:  [id number]           PATIENT TYPE:  I  LOCATION:  1619                         FACILITY:  [facility]  PHYSICIAN:  Ollen Gross, M.D.    DATE OF BIRTH:  1941-05-14  DATE OF PROCEDURE:  11/07/2010 DATE OF DISCHARGE:                              OPERATIVE REPORT   PREOPERATIVE DIAGNOSIS:  Loose right total knee arthroplasty.  POSTOPERATIVE DIAGNOSIS:  Loose right total knee arthroplasty.  PROCEDURE:  Revision right total knee arthroplasty.  SURGEON:  Ollen Gross, MD  ASSISTANT:  Alexzandrew L. Julien Girt, PA-C  ANESTHESIA:  General.  ESTIMATED BLOOD LOSS:  Minimal.  DRAIN:  Hemovac x1.  TOURNIQUET TIME:  Up 53 minutes at 300 mmHg, down 10 minutes up additional 23 minutes at 300 mmHg.  COMPLICATIONS:  None.  CONDITION:  Stable to Recovery.  BRIEF CLINICAL NOTE:  Ms. Archbold is a 70 year old female who had right total knee arthroplasty done several years ago which has become loose. She has had progressively worsening pain and dysfunction.  Bone scan showed loosening.  Preop labs did not show evidence of infection.  She presents now for revision versus resection arthroplasty.  PROCEDURE IN DETAIL:  After successful administration of general anesthetic, tourniquet was placed high on the right thigh, right lower extremity prepped and draped in the usual sterile fashion.  Extremity was wrapped in an Esmarch, knee flexed, tourniquet inflated to 300 mmHg. A medial parapatellar incision which was previous utilized was reutilized, skin cut with a #10 blade through subcutaneous tissue to the extensor mechanism.  A fresh blade was used to make a medial parapatellar arthrotomy.  The knee was very stiff and showing about 50 degrees of flexion.  When I entered the joint, there was barely any fluid and the fluid present was clear and did not appear infected.  The tissue was not inflamed at all.  I sent the  fluid for Gram stain.  I also sent the 2 specimens of tissue for frozen sections.  Both of which came back as no white blood cells.  I then elevated soft tissue on the proximal medial tibia to the semimembranosus bursa.  Laterally, the infrapatellar fat pad was very scarred and I incised that.  I subluxed the patella laterally, but did not evert it.  I flexed the knee 90 degrees.  I was easily able to remove the tibial polyethylene.  It was not very worn.  We then disrupted the interface between the femoral component bone using osteotomes and came off easily with no bone loss. Again, there was no sign of any inflamed or affected appearing tissue. The tibia was then subluxed forward and then retractors placed.  The tibial components then removed by disrupting interface between the component and bone.  This is also removed easily without bone loss.  All the cement was then removed from the tibia and femur.  The canals were then thoroughly irrigated.  I reamed the femoral canal to 16 mm, tibial canal 13 mm.  The tibia was again subluxed forward and retractors placed.  Extramedullary tibial alignment guide was placed  referencing proximally at the medial aspect of the tibial tubercle and distally along the second metatarsal axis and tibial crest.  Blocks pinned to remove about 2 mm off the tibial surface.  The cut was made with an oscillating saw.  Size 3 was the most appropriate tibial component and the proximal tibia was prepared with a modular drill and keel punched for the M.B.T revision size 3.  I had then also prepared for a 29 mm sleeve.  With that broaching, there was great torsional stability.  We then prepared the femur.  A 16 mm reamer was placed to serve as our intramedullary cutting guide.  A 5 degree right valgus alignment guide was placed.  On the lateral side, we cut from the 0 position.  On the medial side, we had to go up to the 8 in order to get any bone, thus an 8 mm  augment was necessary distally on the medial side.  Then, size 3 was the most appropriate for the femur and then we placed a size 3 cutting block in a +2 position to effectively raise the stem and lower the anterior flange of the prosthesis down to the anterior cortex of the femur.  The rotation was marked by placing the spacer in a flexion space to get a symmetric gap.  The block was pinned in this position. Anteriorly, we skimmed bone and medially I had to go in a +4 position, laterally the +8 position to get any bone.  Thus, a 4 mm posterior lateral augment, 8 mm posterior medial augment was necessary.  We then placed the intercondylar block to do the chamfer and intercondylar cut for the TC3.  A trial TC3 size 3 femur was placed with the stem in a +2 position and a 75 x 16 stem.  Augments were 8 mm distal medial, 8 mm posterior medial, and 4 mm posterior lateral.  With this, did not go all the way down to the base of the distal cut medially, so I went to a 12. With the 12, I could not bend the knee posterior, so I had to go with 4 posterior medial.  With this, we had the best fit on the femur.  Then, placed a 17.5 mm insert.  We need to go 20 to get the knee with excellent balance between flexion/extension.  Full extension was achieved with excellent varus-valgus and anterior-posterior balance, throughout full range of motion.  We then everted the patella.  I did a patelloplasty removing all the soft tissue covering the component. Fortunately, the component was not worn.  I placed the knee through a range of motion and tracked concentrically.  We then opened the components on the back table and let the tourniquet down for the first tourniquet time of 53 minutes.  The tourniquet was held down for 10 minutes while the components were assembled on the back table.  The components were of same size and configuration as a trial.  We trial for the cement restrictor and it was a size 4.  Once  the tourniquet was down for 10 minutes, we re-wrapped an Esmarch and re-inflated to 300 mmHg.  I then removed the rest of the components and placed the size 4 cement restrictor in the tibial canal.  The cut bone surfaces were then thoroughly irrigated with pulsatile lavage.  Cement was mixed, which was the gentamicin-impregnated cement.  Once the area of implantation is injected into the tibial canal and then tibial component which is a  size 3 M.B.T revision tray with a 29 sleeve and a 13 x 30 stem extension is impacted into the tibia and also extruded cement removed.  On the femoral side, we cemented distally and had a Press-Fit stem proximally. The augments on the size 3 TC3 femur ended up being 4 mm posteromedial, 4 mm posterolateral, 12 mm distal medial.  The stems in the femoral components impacted and all extruded cement removed.  A 20 mm trial insert was placed, knee held in full extension, and the rest of the cement removed that was extruded.  Once the cement was fully hardened, then the permanent 20 mm TC3 rotating platform insert was placed in tibial tray.  Wound was copiously irrigated with saline solution and the arthrotomy closed over Hemovac drain with interrupted #1 PDS.  Flexion against gravity was about 110 degrees.  Tourniquet was released second tourniquet time of 26 minutes.  Once the arthrotomy was closed with PDS, then the subcutaneous was closed with interrupted 2-0 Vicryl, and skin with staples.  Catheter for Marcaine pain pump was placed and pump was initiated.  Incision was cleaned and dried and a bulky sterile dressing applied.  She was then placed into a knee immobilizer, awakened, and transported to Recovery in stable condition.     Ollen Gross, M.D.     FA/MEDQ  D:  11/07/2010  T:  11/08/2010  Job:  [id number]  Electronically Signed by Ollen Gross M.D. on 11/10/2010 04:06:52 PM

## 2010-11-12 LAB — ANAEROBIC CULTURE

## 2010-11-21 NOTE — H&P (Signed)
  [name], [name] NO.:  [id number]  MEDICAL RECORD NO.:  [id number]           PATIENT TYPE:  I  LOCATION:  1619                         FACILITY:  [facility]  PHYSICIAN:  Ollen Gross, M.D.    DATE OF BIRTH:  [birth year]  DATE OF ADMISSION:  11/07/2010 DATE OF DISCHARGE:                             HISTORY & PHYSICAL   CHIEF COMPLAINT:  Right greater than left knee pain.  HISTORY OF PRESENT ILLNESS:  The patient is a 70 year old female who has been seen by Dr. Lequita Halt, who has had bilateral total knees in the past. She had a previous left uni knee which was converted over to a left total knee.  She has also had a right knee done.  She has had bone scan in the past and had workup for her knee pain.  DICTATION ENDS AT THIS POINT.     Alexzandrew L. Julien Girt, P.A.C.   ______________________________ Ollen Gross, M.D.    ALP/MEDQ  D:  11/08/2010  T:  11/08/2010  Job:  [id number]  cc:   Lia Hopping Fax: [phone]  Electronically Signed by Patrica Duel P.A.C. on 11/15/2010 10:43:45 AM Electronically Signed by Ollen Gross M.D. on 11/21/2010 03:23:41 PM

## 2010-11-21 NOTE — H&P (Signed)
NAME:  [name], [name] NO.:  [id number]  MEDICAL RECORD NO.:  [id number]           PATIENT TYPE:  I  LOCATION:  1619                         FACILITY:  [facility]  PHYSICIAN:  [name], M.D.    DATE OF BIRTH:  27-Feb-1941  DATE OF ADMISSION:  11/07/2010 DATE OF DISCHARGE:                             HISTORY & PHYSICAL   CHIEF COMPLAINT:  Bilateral knee pain, right greater than left.  HISTORY OF PRESENT ILLNESS:  The patient is a 70 year old female with a complex history regard to her knees.  She had unicompartmental replacements in both knees in the past and she had a failed uni knee on the left, which converted over to a total knee by Dr. Lequita [name] back in March 2011.  She had a previous right uni knee which have been converted over total knee but has had problems with that right knee ongoing for quite some time now.  She has been worked up and felt that it was possibly loosened enough and she needed to have it revised.  The patient was convinced that something wrong with one of her sessions and physical therapy in the past.  She is subsequently admitted now for revision prosthesis.  ALLERGIES:  No known drug allergies.  CURRENT MEDICATIONS:  Labetalol, quinapril, red yeast rice, aspirin, Aleve, oxycodone, calcium plus D, vitamin C, vitamin E, fish oil.  PAST MEDICAL HISTORY:  COPD, hypertension, elevated cholesterol, diet- controlled diabetes, lumbar degenerative disk disease with stenosis, postmenopausal, hypercholesterolemia.  PAST SURGICAL HISTORY:  Bilateral unicompartmental replacement knees and then a left knee revision to a total knee, right knee revision to a total knee and also breast implant surgery.  FAMILY HISTORY:  Father with heart disease, arthritis, heart attack. Mother with heart disease, arthritis and cancer.  SOCIAL HISTORY:  Married, nonsmoker.  She does have caregiver lined up. Has three steps entering her home.  REVIEW OF SYSTEMS:   GENERAL:  No fevers, chills or night sweats. NEUROLOGIC:  No seizures, syncope or paralysis.  RESPIRATORY: No shortness breath, productive cough or hemoptysis.  She does get a little bit of shortness of breath on exertion but no shortness of breath at rest.  CARDIOVASCULAR:  No chest pain or orthopnea.  GI: No nausea, vomiting, diarrhea, constipation.  GU:  Little bit of nocturia.  No dysuria, hematuria.  MUSCULOSKELETAL:  Knee pain.  PHYSICAL EXAMINATION:  VITAL SIGNS:  Pulse 64, respirations 12, blood pressure 158/88. GENERAL:  70 year old white female, well-nourished, well-developed, alert, oriented, cooperative, overweight, no acute distress. HEENT:  Normocephalic, atraumatic.  Pupils are round and reactive.  EOMs intact. NECK:  Supple. CHEST:  Clear. HEART:  Regular rate and rhythm with a faint systolic ejection murmur, best over aortic point. ABDOMEN:  Soft, round, protuberant abdomen.  Bowel sounds present. RECTAL, BREASTS, GENITALIA:  Not done and not part of present illness. EXTREMITIES:  Right knee range of motion 0 to 100, slight varus and valgus laxity.  Venous stasis changes in both legs.  Left knee range of motion 0 to 115.  No instability.  IMPRESSION:  Right knee pain.  PLAN:  The patient  was admitted to [facility] to undergo revision right total knee arthroplasty.  Surgery will be performed by Dr. Ollen [name].     Alexzandrew L. Julien Girt, P.A.C.   ______________________________ [name], M.D.    ALP/MEDQ  D:  11/08/2010  T:  11/08/2010  Job:  [id number]  cc:   Lia Hopping Fax: [phone]  Electronically Signed by Patrica Duel P.A.C. on 11/15/2010 10:43:51 AM Electronically Signed by [name] M.D. on 11/21/2010 03:23:44 PM

## 2010-12-19 ENCOUNTER — Encounter: Payer: Self-pay | Admitting: Cardiology

## 2010-12-19 NOTE — Discharge Summary (Addendum)
[name], [name] NO.:  [id number]  MEDICAL RECORD NO.:  [id number]  LOCATION:  1619                         FACILITY:  [facility]  PHYSICIAN:  Ollen Gross, M.D.    DATE OF BIRTH:  06-23-1940  DATE OF ADMISSION:  11/07/2010 DATE OF DISCHARGE:  11/10/2010                              DISCHARGE SUMMARY   ADMITTING DIAGNOSES: 1. Painful right knee. 2. Chronic obstructive pulmonary disease. 3. Hypertension. 4. Hypercholesterolemia. 5. Diet-controlled diabetes mellitus. 6. Lumbar degenerative disk disease with spinal stenosis. 7. Postmenopausal.  DISCHARGE DIAGNOSES: 1. Loose right total knee arthroplasty, status post revision right     total knee arthroplasty. 2. Postop acute blood loss anemia . 3. Chronic obstructive pulmonary disease. 4. Hypertension. 5. Hypercholesterolemia. 6. Diet-controlled diabetes mellitus. 7. Lumbar degenerative disk disease with spinal stenosis. 8. Postmenopausal.  PROCEDURE:  Nov 07, 2010, revision right total knee arthroplasty.  SURGEON:  Ollen Gross, M.D.  ASSISTANT:  Nelsy Madonna L. Kashtyn Jankowski, P.A.C.  ANESTHESIA:  General.  TOURNIQUET TIME:  53 minutes and down for 10 minutes and up an additional 23 minutes for a total of 76.  CONSULTS:  None.  BRIEF HISTORY:  The patient is a 70 year old female with a right total knee done several years ago which unfortunately loosened up.  She has had progressive worsening pain dysfunction.  The bone scan showed loosening.  Preoperative labs did not show any evidence of infection, now presents for revision versus resection.  LABORATORY DATA:  CBC on admission, hemoglobin 13.0, hematocrit 40.1, white cell count 5.5, platelets 187,000.  PT/INR preop 13.5 and 1.01 with PTT of 27.  Chem panel all within normal limits.  Preop UA was negative.  Serial CBCs were followed.  Hemoglobin dropped from 9.7 to 9.6.  Last H and H 8.9 and 26.9.  Serial BMETs were followed for 48 hours.   Electrolytes remained within normal limits.  Blood group type A negative.  Stat Gram stain showed rare gram-positive cocci but no wbc's. The wound culture, no organisms seen on the smear and no growth on the culture.  Anaerobe culture again, no organisms seen on the smear and no anaerobes were isolated.  EKG dated Oct 30, 2010, sinus bradycardia with first-degree AV block, no significant change was found confirmed by Dr. Charlton Haws.  HOSPITAL COURSE:  The patient was admitted to [facility], taken to OR, underwent above-stated procedure without complication.  The patient tolerated the procedure well, later transferred to recovery room on the orthopedic floor, started on p.o. and IV analgesic pain control following surgery.  She was seen in rounds on day 1, doing very well. It was noted that the Gram stain showed rare gram-positive cocci but this was likely a contaminant.  There were no other signs of infection. White count was normal.  The tissues looked pristine and we were following the other cultures.  She had excellent urinary output.  She had a little bit of low hemoglobin.  We started her on some iron supplementation but did not have any symptoms.  She started back on her home meds and her blood pressure meds were put on parameters.  By day 2, she was doing very  well.  Cultures remained negative.  Incision looked good.  We changed the dressing.  Staples were intact.  Hemoglobin was stable at 9.6, again asymptomatic.  Continued to progress well with therapy and by day 3, she was doing well.  Discharged home.  Her hemoglobin was down to 8.9 but she was asymptomatic with this and continued well with her therapy.  PLAN: 1. The patient is discharged home on November 10, 2010. 2. Discharge diagnoses, please see above. 3. Discharge medications, Nu-Iron, Robaxin, OxyIR, Xarelto.  Continue     home medications of labetalol and quinapril/hydrochlorothiazide.  DIET:  Heart-healthy  diet.  ACTIVITY:  Weightbearing as tolerated, total knee protocol.  Follow up in 2 weeks.  DISPOSITION:  Home.  CONDITION ON DISCHARGE:  Improving.     Karrin Eisenmenger L. Julien Girt, P.A.C.   ______________________________ Ollen Gross, M.D.    ALP/MEDQ  D:  12/11/2010  T:  12/11/2010  Job:  [id number]  cc:   Lia Hopping, MD Fax: [phone]  Electronically Signed by Ollen Gross M.D. on 12/19/2010 12:32:05 PM Electronically Signed by Marlowe Sax Dahna Hattabaugh P.A.C. on 12/20/2010 10:37:59 AM

## 2011-03-21 LAB — BASIC METABOLIC PANEL
BUN: 11
Calcium: 9
GFR calc non Af Amer: >60
Glucose, Bld: 139 — ABNORMAL HIGH
Sodium: 140

## 2011-04-07 ENCOUNTER — Emergency Department (HOSPITAL_COMMUNITY): Payer: Medicare Other

## 2011-04-07 ENCOUNTER — Emergency Department (HOSPITAL_COMMUNITY)
Admission: EM | Admit: 2011-04-07 | Discharge: 2011-04-07 | Disposition: A | Discharge status: Home / Self Care | Payer: Medicare Other | Attending: Emergency Medicine | Admitting: Emergency Medicine

## 2011-04-07 DIAGNOSIS — Z96659 Presence of unspecified artificial knee joint: Secondary | ICD-10-CM | POA: Insufficient documentation

## 2011-04-07 DIAGNOSIS — M25569 Pain in unspecified knee: Secondary | ICD-10-CM | POA: Insufficient documentation

## 2011-04-07 DIAGNOSIS — E7800 Pure hypercholesterolemia, unspecified: Secondary | ICD-10-CM | POA: Insufficient documentation

## 2011-04-07 DIAGNOSIS — M129 Arthropathy, unspecified: Secondary | ICD-10-CM | POA: Insufficient documentation

## 2011-04-07 DIAGNOSIS — Z7982 Long term (current) use of aspirin: Secondary | ICD-10-CM | POA: Insufficient documentation

## 2011-04-07 DIAGNOSIS — I10 Essential (primary) hypertension: Secondary | ICD-10-CM | POA: Insufficient documentation

## 2011-04-07 DIAGNOSIS — Z79899 Other long term (current) drug therapy: Secondary | ICD-10-CM | POA: Insufficient documentation

## 2011-04-07 DIAGNOSIS — M25469 Effusion, unspecified knee: Secondary | ICD-10-CM | POA: Insufficient documentation

## 2011-04-07 LAB — CBC
MCV: 84.3 fL (ref 78.0–100.0)
Platelets: 184 10*3/uL (ref 150–400)
RBC: 3.96 MIL/uL (ref 3.87–5.11)
WBC: 10 10*3/uL (ref 4.0–10.5)

## 2011-04-07 LAB — DIFFERENTIAL
Basophils Relative: 0 % (ref 0–1)
Eosinophils Absolute: 0.1 10*3/uL (ref 0.0–0.7)
Lymphocytes Relative: 12 % (ref 12–46)
Monocytes Relative: 9 % (ref 3–12)
Neutrophils Relative %: 78 % — ABNORMAL HIGH (ref 43–77)

## 2011-04-07 LAB — GRAM STAIN

## 2011-04-07 LAB — SEDIMENTATION RATE: Sed Rate: 73 mm/hr — ABNORMAL HIGH (ref 0–22)

## 2011-04-07 LAB — SYNOVIAL CELL COUNT + DIFF, W/ CRYSTALS
Lymphocytes-Synovial Fld: 7 % (ref 0–20)
Monocyte-Macrophage-Synovial Fluid: 1 % — ABNORMAL LOW (ref 50–90)

## 2011-04-10 ENCOUNTER — Inpatient Hospital Stay (HOSPITAL_COMMUNITY)
Admission: AD | Admit: 2011-04-10 | Discharge: 2011-04-24 | DRG: 486 | Disposition: A | Discharge status: Skilled Nursing Facility | Payer: Medicare Other | Source: Ambulatory Visit | Attending: Orthopedic Surgery | Admitting: Orthopedic Surgery

## 2011-04-10 DIAGNOSIS — Y831 Surgical operation with implant of artificial internal device as the cause of abnormal reaction of the patient, or of later complication, without mention of misadventure at the time of the procedure: Secondary | ICD-10-CM | POA: Diagnosis present

## 2011-04-10 DIAGNOSIS — I10 Essential (primary) hypertension: Secondary | ICD-10-CM | POA: Diagnosis present

## 2011-04-10 DIAGNOSIS — R7309 Other abnormal glucose: Secondary | ICD-10-CM | POA: Diagnosis present

## 2011-04-10 DIAGNOSIS — I440 Atrioventricular block, first degree: Secondary | ICD-10-CM | POA: Diagnosis present

## 2011-04-10 DIAGNOSIS — T8450XA Infection and inflammatory reaction due to unspecified internal joint prosthesis, initial encounter: Principal | ICD-10-CM | POA: Diagnosis present

## 2011-04-10 DIAGNOSIS — J4489 Other specified chronic obstructive pulmonary disease: Secondary | ICD-10-CM | POA: Diagnosis present

## 2011-04-10 DIAGNOSIS — E876 Hypokalemia: Secondary | ICD-10-CM | POA: Diagnosis not present

## 2011-04-10 DIAGNOSIS — R0989 Other specified symptoms and signs involving the circulatory and respiratory systems: Secondary | ICD-10-CM | POA: Diagnosis not present

## 2011-04-10 DIAGNOSIS — M67919 Unspecified disorder of synovium and tendon, unspecified shoulder: Secondary | ICD-10-CM | POA: Diagnosis present

## 2011-04-10 DIAGNOSIS — M159 Polyosteoarthritis, unspecified: Secondary | ICD-10-CM | POA: Diagnosis present

## 2011-04-10 DIAGNOSIS — Z859 Personal history of malignant neoplasm, unspecified: Secondary | ICD-10-CM

## 2011-04-10 DIAGNOSIS — M719 Bursopathy, unspecified: Secondary | ICD-10-CM | POA: Diagnosis present

## 2011-04-10 DIAGNOSIS — M48061 Spinal stenosis, lumbar region without neurogenic claudication: Secondary | ICD-10-CM | POA: Diagnosis present

## 2011-04-10 DIAGNOSIS — J449 Chronic obstructive pulmonary disease, unspecified: Secondary | ICD-10-CM | POA: Diagnosis present

## 2011-04-10 DIAGNOSIS — Z96659 Presence of unspecified artificial knee joint: Secondary | ICD-10-CM

## 2011-04-10 DIAGNOSIS — E785 Hyperlipidemia, unspecified: Secondary | ICD-10-CM | POA: Diagnosis present

## 2011-04-10 DIAGNOSIS — R5381 Other malaise: Secondary | ICD-10-CM | POA: Diagnosis not present

## 2011-04-10 DIAGNOSIS — R197 Diarrhea, unspecified: Secondary | ICD-10-CM | POA: Diagnosis not present

## 2011-04-10 DIAGNOSIS — R0609 Other forms of dyspnea: Secondary | ICD-10-CM | POA: Diagnosis not present

## 2011-04-10 DIAGNOSIS — W19XXXA Unspecified fall, initial encounter: Secondary | ICD-10-CM | POA: Diagnosis not present

## 2011-04-10 DIAGNOSIS — D62 Acute posthemorrhagic anemia: Secondary | ICD-10-CM | POA: Diagnosis not present

## 2011-04-10 HISTORY — DX: Spinal stenosis, lumbar region without neurogenic claudication: M48.061

## 2011-04-10 LAB — APTT: aPTT: 36 seconds (ref 24–37)

## 2011-04-10 LAB — COMPREHENSIVE METABOLIC PANEL
ALT: 68 U/L — ABNORMAL HIGH (ref 0–35)
AST: 57 U/L — ABNORMAL HIGH (ref 0–37)
Alkaline Phosphatase: 250 U/L — ABNORMAL HIGH (ref 39–117)
GFR calc Af Amer: >90 mL/min (ref >90)
Glucose, Bld: 116 mg/dL — ABNORMAL HIGH (ref 70–99)
Potassium: 3.2 mEq/L — ABNORMAL LOW (ref 3.5–5.1)
Sodium: 136 mEq/L (ref 135–145)
Total Protein: 6.7 g/dL (ref 6.0–8.3)

## 2011-04-10 LAB — URINALYSIS, ROUTINE W REFLEX MICROSCOPIC
Glucose, UA: NEGATIVE mg/dL
Specific Gravity, Urine: 1.021 (ref 1.005–1.030)

## 2011-04-10 LAB — CBC
MCH: 27.3 pg (ref 26.0–34.0)
MCV: 83.9 fL (ref 78.0–100.0)
Platelets: 247 10*3/uL (ref 150–400)
RBC: 3.84 MIL/uL — ABNORMAL LOW (ref 3.87–5.11)
RDW: 16.5 % — ABNORMAL HIGH (ref 11.5–15.5)

## 2011-04-10 LAB — BODY FLUID CULTURE

## 2011-04-10 LAB — URINE MICROSCOPIC-ADD ON

## 2011-04-10 LAB — PROTIME-INR: Prothrombin Time: 15.5 seconds — ABNORMAL HIGH (ref 11.6–15.2)

## 2011-04-10 LAB — GLUCOSE, CAPILLARY

## 2011-04-11 LAB — CBC
Hemoglobin: 9 g/dL — ABNORMAL LOW (ref 12.0–15.0)
Platelets: 256 10*3/uL (ref 150–400)
RBC: 3.29 MIL/uL — ABNORMAL LOW (ref 3.87–5.11)
WBC: 8.9 10*3/uL (ref 4.0–10.5)

## 2011-04-11 LAB — BASIC METABOLIC PANEL
CO2: 32 mEq/L (ref 19–32)
Chloride: 99 mEq/L (ref 96–112)
Glucose, Bld: 166 mg/dL — ABNORMAL HIGH (ref 70–99)
Potassium: 3.8 mEq/L (ref 3.5–5.1)
Sodium: 137 mEq/L (ref 135–145)

## 2011-04-12 ENCOUNTER — Inpatient Hospital Stay (HOSPITAL_COMMUNITY): Payer: Medicare Other

## 2011-04-12 LAB — BASIC METABOLIC PANEL
BUN: 6 mg/dL (ref 6–23)
CO2: 34 mEq/L — ABNORMAL HIGH (ref 19–32)
Chloride: 95 mEq/L — ABNORMAL LOW (ref 96–112)
GFR calc Af Amer: >90 mL/min (ref >90)
Glucose, Bld: 114 mg/dL — ABNORMAL HIGH (ref 70–99)
Potassium: 2.9 mEq/L — ABNORMAL LOW (ref 3.5–5.1)

## 2011-04-12 NOTE — Op Note (Signed)
[name], [name] NO.:  [id number]  MEDICAL RECORD NO.:  [id number]  LOCATION:  1604                         FACILITY:  [facility]  PHYSICIAN:  Ollen Gross, M.D.    DATE OF BIRTH:  Feb 14, 1941  DATE OF PROCEDURE:  04/10/2011 DATE OF DISCHARGE:                              OPERATIVE REPORT   PREOPERATIVE DIAGNOSIS:  Infected right total knee.  POSTOPERATIVE DIAGNOSIS:  Infected right total knee.  PROCEDURE:  Right knee arthrotomy, irrigation debridement, and polyethylene liner exchange.  SURGEON:  Ollen Gross, M.D.  ASSISTANT:  Alexzandrew L. Perkins, P.A.C.  ANESTHESIA:  General.  ESTIMATED BLOOD LOSS:  Minimal.  DRAINS:  Hemovac x1.  TOURNIQUET TIME:  40 minutes at 300 mmHg.  COMPLICATIONS:  None.  CONDITION:  Stable to recovery.  BRIEF CLINICAL NOTE:  [name] is a 70 year old female, had a revision of right total knee arthroplasty done several months ago.  She did fine with no problems until this past weekend when she developed pain and swelling in the knee.  She did not have any prodromal symptoms.  I saw her in the office yesterday, the knee was swollen.  I aspirated the knee and there was seropurulent fluid.  We admitted her to the hospital today for formal open irrigation and debridement and polyethylene exchange.  PROCEDURE IN DETAIL:  After successful administration of general anesthetic, a tourniquet was placed high on her right thigh and her right lower extremity was prepped and draped in the usual sterile fashion.  Extremities wrapped in Esmarch, tourniquet inflated to 300 mmHg.  Midline incision was made with a #10 blade through subcu tissue to the extensor mechanism.  Fresh blade was used to make a medial parapatellar arthrotomy.  Seropurulent fluid was identified.  Sent for Gram stain and C and S.  The soft tissue on the proximal medial tibia was then subperiosteally elevated to the joint line with the knife and around the  semimembranosus bursa with a Cobb elevator.  Soft tissue laterally was elevated with attention being paid to avoid the patellar tendon on tibial tubercle.  I did a thorough synovectomy and scar debridement in the suprapatellar area, medial and lateral gutters.  I was then able flex the knee, subluxed the tibia forward and removed the tibial polyethylene.  I then cleaned out posteriorly removing the inflamed synovium.  Then, thoroughly irrigated the joint with 3 L of saline using pulsatile lavage.  We then placed a new insert which is a 22.5 mm size 3, TC3 rotating platform insert.  That was placed and the knee was reduced with excellent stability.  Wound was again irrigated with 3 more liter of saline using pulsatile lavage.  I felt very comfortable with the condition of the tissues after the debridement and the lavage.  We subsequently closed the extensor mechanism over Hemovac drain with interrupted #1 PDS.  The subcu was then closed with interrupted 2-0 Vicryl.  Tourniquet was released, total time of 40 minutes.  Skin was then closed with staples.  Incisions cleaned and dried, and bulky sterile dressing was applied.  She was awakened and transported to the recovery in stable condition.     Homero Fellers  Jajuan Skoog, M.D.     FA/MEDQ  D:  04/10/2011  T:  04/11/2011  Job:  [id number]  Electronically Signed by Ollen Gross M.D. on 04/12/2011 09:58:00 AM

## 2011-04-13 ENCOUNTER — Inpatient Hospital Stay (HOSPITAL_COMMUNITY): Payer: Medicare Other

## 2011-04-13 LAB — BODY FLUID CULTURE

## 2011-04-13 LAB — BASIC METABOLIC PANEL
CO2: 31 mEq/L (ref 19–32)
Chloride: 97 mEq/L (ref 96–112)
Creatinine, Ser: 0.6 mg/dL (ref 0.50–1.10)

## 2011-04-13 LAB — CBC
HCT: 27.2 % — ABNORMAL LOW (ref 36.0–46.0)
Hemoglobin: 8.8 g/dL — ABNORMAL LOW (ref 12.0–15.0)
MCHC: 32.4 g/dL (ref 30.0–36.0)
MCV: 84.5 fL (ref 78.0–100.0)

## 2011-04-13 LAB — PROTIME-INR: INR: 1.27 (ref 0.00–1.49)

## 2011-04-13 MED ORDER — MORPHINE SULFATE 2 MG/ML IJ SOLN: 1.0000 mg | INTRAMUSCULAR | Status: DC | PRN

## 2011-04-13 MED ORDER — POLYSACCHARIDE IRON 150 MG PO CAPS
150.0000 mg | ORAL_CAPSULE | Freq: Every day | ORAL | Status: DC
  Administered 2011-04-14 – 2011-04-18 (×5): 150 mg via ORAL
  Filled 2011-04-13 (×6): qty 1

## 2011-04-13 MED ORDER — SODIUM CHLORIDE 0.9 % IJ SOLN
10.0000 mL | INTRAMUSCULAR | Status: DC | PRN
  Administered 2011-04-18: 10 mL via INTRAVENOUS

## 2011-04-13 MED ORDER — BISACODYL 5 MG PO TBEC: 10.0000 mg | DELAYED_RELEASE_TABLET | Freq: Every day | ORAL | Status: DC | PRN

## 2011-04-13 MED ORDER — ACETAMINOPHEN 650 MG RE SUPP: 325.0000 mg | RECTAL | Status: DC | PRN

## 2011-04-13 MED ORDER — BISACODYL 10 MG RE SUPP: 10.0000 mg | Freq: Every day | RECTAL | Status: DC | PRN

## 2011-04-13 MED ORDER — ALUM & MAG HYDROXIDE-SIMETH 200-200-20 MG/5ML PO SUSP: 30.0000 mL | ORAL | Status: DC | PRN

## 2011-04-13 MED ORDER — LISINOPRIL 20 MG PO TABS
20.0000 mg | ORAL_TABLET | Freq: Every day | ORAL | Status: DC
  Administered 2011-04-14 – 2011-04-24 (×10): 20 mg via ORAL
  Filled 2011-04-13 (×11): qty 1

## 2011-04-13 MED ORDER — OXYCODONE HCL 5 MG PO TABS
5.0000 mg | ORAL_TABLET | ORAL | Status: DC | PRN
  Administered 2011-04-14 – 2011-04-23 (×44): 10 mg via ORAL
  Administered 2011-04-24: 5 mg via ORAL
  Administered 2011-04-24 (×2): 10 mg via ORAL
  Filled 2011-04-13 (×5): qty 2

## 2011-04-13 MED ORDER — METHOCARBAMOL 100 MG/ML IJ SOLN
500.0000 mg | Freq: Four times a day (QID) | INTRAVENOUS | Status: DC | PRN
  Filled 2011-04-13: qty 5

## 2011-04-13 MED ORDER — CHLORHEXIDINE GLUCONATE CLOTH 2 % EX PADS: 6.0000 | MEDICATED_PAD | Freq: Every day | CUTANEOUS | Status: AC

## 2011-04-13 MED ORDER — DOCUSATE SODIUM 100 MG PO CAPS
100.0000 mg | ORAL_CAPSULE | Freq: Two times a day (BID) | ORAL | Status: DC
  Administered 2011-04-13 – 2011-04-23 (×21): 100 mg via ORAL
  Filled 2011-04-13 (×24): qty 1

## 2011-04-13 MED ORDER — ONDANSETRON HCL 4 MG PO TABS: 4.0000 mg | ORAL_TABLET | Freq: Four times a day (QID) | ORAL | Status: DC | PRN

## 2011-04-13 MED ORDER — SODIUM CHLORIDE 0.9 % IJ SOLN: 10.0000 mL | Freq: Two times a day (BID) | INTRAMUSCULAR | Status: DC

## 2011-04-13 MED ORDER — LABETALOL HCL 300 MG PO TABS
300.0000 mg | ORAL_TABLET | Freq: Two times a day (BID) | ORAL | Status: DC
  Administered 2011-04-14 – 2011-04-24 (×21): 300 mg via ORAL
  Filled 2011-04-13 (×23): qty 1

## 2011-04-13 MED ORDER — WARFARIN SODIUM 5 MG PO TABS
5.0000 mg | ORAL_TABLET | Freq: Every day | ORAL | Status: DC
  Filled 2011-04-13 (×2): qty 1

## 2011-04-13 MED ORDER — RIFAMPIN 300 MG PO CAPS
300.0000 mg | ORAL_CAPSULE | Freq: Every day | ORAL | Status: DC
  Administered 2011-04-14 – 2011-04-24 (×11): 300 mg via ORAL
  Filled 2011-04-13 (×13): qty 1

## 2011-04-13 MED ORDER — MENTHOL 3 MG MT LOZG
1.0000 | LOZENGE | OROMUCOSAL | Status: DC | PRN
  Filled 2011-04-13: qty 9

## 2011-04-13 MED ORDER — TRIAMCINOLONE ACETONIDE 0.1 % EX CREA
TOPICAL_CREAM | Freq: Two times a day (BID) | CUTANEOUS | Status: DC
  Administered 2011-04-14 – 2011-04-16 (×4): via TOPICAL
  Administered 2011-04-16 – 2011-04-17 (×2): 1 via TOPICAL
  Administered 2011-04-17 – 2011-04-24 (×12): via TOPICAL
  Filled 2011-04-13: qty 15

## 2011-04-13 MED ORDER — ONDANSETRON HCL 4 MG/2ML IJ SOLN: 4.0000 mg | Freq: Four times a day (QID) | INTRAMUSCULAR | Status: DC | PRN

## 2011-04-13 MED ORDER — ACETAMINOPHEN 325 MG PO TABS: 325.0000 mg | ORAL_TABLET | ORAL | Status: DC | PRN

## 2011-04-13 MED ORDER — MUPIROCIN 2 % EX OINT
TOPICAL_OINTMENT | Freq: Two times a day (BID) | CUTANEOUS | Status: AC
  Administered 2011-04-14 (×2): 1 via NASAL

## 2011-04-13 MED ORDER — METHOCARBAMOL 500 MG PO TABS
500.0000 mg | ORAL_TABLET | Freq: Four times a day (QID) | ORAL | Status: DC | PRN
  Administered 2011-04-14 – 2011-04-23 (×20): 500 mg via ORAL
  Filled 2011-04-13 (×3): qty 1

## 2011-04-13 MED ORDER — PHENOL 1.4 % MT LIQD: 1.0000 | OROMUCOSAL | Status: DC | PRN

## 2011-04-13 MED ORDER — ZOLPIDEM TARTRATE 5 MG PO TABS: 5.0000 mg | ORAL_TABLET | Freq: Every evening | ORAL | Status: DC | PRN

## 2011-04-13 MED ORDER — VANCOMYCIN HCL IN DEXTROSE 1-5 GM/200ML-% IV SOLN
1000.0000 mg | Freq: Two times a day (BID) | INTRAVENOUS | Status: DC
  Administered 2011-04-13 – 2011-04-24 (×22): 1000 mg via INTRAVENOUS
  Filled 2011-04-13 (×24): qty 200

## 2011-04-13 MED ORDER — FLEET ENEMA 7-19 GM/118ML RE ENEM: 1.0000 | ENEMA | Freq: Every day | RECTAL | Status: DC | PRN

## 2011-04-13 MED ORDER — SODIUM CHLORIDE 0.9 % IV SOLN
INTRAVENOUS | Status: DC
  Administered 2011-04-14 – 2011-04-18 (×3): via INTRAVENOUS

## 2011-04-13 MED ORDER — DIPHENHYDRAMINE HCL 25 MG PO CAPS: 25.0000 mg | ORAL_CAPSULE | ORAL | Status: DC | PRN

## 2011-04-13 MED ORDER — HYDROCHLOROTHIAZIDE 12.5 MG PO CAPS
12.5000 mg | ORAL_CAPSULE | Freq: Every day | ORAL | Status: DC
  Administered 2011-04-14 – 2011-04-24 (×10): 12.5 mg via ORAL
  Filled 2011-04-13 (×11): qty 1

## 2011-04-14 LAB — PROTIME-INR
INR: 1.38 (ref 0.00–1.49)
Prothrombin Time: 17.2 seconds — ABNORMAL HIGH (ref 11.6–15.2)

## 2011-04-14 LAB — CBC
MCHC: 33.5 g/dL (ref 30.0–36.0)
Platelets: 307 10*3/uL (ref 150–400)
RDW: 16.2 % — ABNORMAL HIGH (ref 11.5–15.5)

## 2011-04-14 MED ORDER — HYDROMORPHONE HCL PF 1 MG/ML IJ SOLN
INTRAMUSCULAR | Status: AC
  Administered 2011-04-14: 0.5 mg via INTRAVENOUS
  Filled 2011-04-14: qty 1

## 2011-04-14 MED ORDER — OXYCODONE HCL 5 MG PO TABS
ORAL_TABLET | ORAL | Status: AC
  Administered 2011-04-14: 10 mg via ORAL
  Filled 2011-04-14: qty 2

## 2011-04-14 MED ORDER — PREDNISONE (PAK) 5 MG PO TABS
5.0000 mg | ORAL_TABLET | Freq: Three times a day (TID) | ORAL | Status: AC
  Administered 2011-04-15 (×3): 5 mg via ORAL

## 2011-04-14 MED ORDER — PREDNISONE (PAK) 5 MG PO TABS
5.0000 mg | ORAL_TABLET | ORAL | Status: AC
  Administered 2011-04-14: 5 mg via ORAL

## 2011-04-14 MED ORDER — WARFARIN SODIUM 7.5 MG PO TABS
7.5000 mg | ORAL_TABLET | Freq: Once | ORAL | Status: AC
  Administered 2011-04-14: 7.5 mg via ORAL
  Filled 2011-04-14: qty 1

## 2011-04-14 MED ORDER — PREDNISONE (PAK) 5 MG PO TABS
5.0000 mg | ORAL_TABLET | Freq: Four times a day (QID) | ORAL | Status: AC
  Administered 2011-04-16 – 2011-04-19 (×10): 5 mg via ORAL

## 2011-04-14 MED ORDER — PREDNISONE (PAK) 5 MG PO TABS
10.0000 mg | ORAL_TABLET | Freq: Every evening | ORAL | Status: AC
  Administered 2011-04-14: 10 mg via ORAL

## 2011-04-14 MED ORDER — METHOCARBAMOL 500 MG PO TABS
ORAL_TABLET | ORAL | Status: AC
  Administered 2011-04-14: 500 mg via ORAL
  Filled 2011-04-14: qty 1

## 2011-04-14 MED ORDER — PREDNISONE (PAK) 5 MG PO TABS
10.0000 mg | ORAL_TABLET | Freq: Every morning | ORAL | Status: AC
  Administered 2011-04-14: 10 mg via ORAL
  Filled 2011-04-14: qty 21

## 2011-04-14 MED ORDER — OXYCODONE HCL 5 MG PO TABS
ORAL_TABLET | ORAL | Status: AC
  Filled 2011-04-14: qty 2

## 2011-04-14 MED ORDER — HYDROMORPHONE HCL PF 1 MG/ML IJ SOLN
0.5000 mg | INTRAMUSCULAR | Status: DC | PRN
  Administered 2011-04-14: 0.5 mg via INTRAVENOUS

## 2011-04-14 MED ORDER — PREDNISONE (PAK) 5 MG PO TABS
10.0000 mg | ORAL_TABLET | Freq: Every evening | ORAL | Status: AC
  Administered 2011-04-15: 10 mg via ORAL

## 2011-04-14 NOTE — Progress Notes (Signed)
ANTICOAGULATION CONSULT NOTE - Follow Up Consult  Pharmacy Consult for Warfarin Indication: VTE prophylaxis  No Known Allergies  Patient Measurements: Height: 5' 5.5" (166.4 cm) (Entered during cutover) Weight: 209 lb 15.8 oz (95.25 kg) (Entered during cutover) IBW/kg (Calculated) : 58.15    Vital Signs: Temp: 98.2 F (36.8 C) (11/04 0520) BP: 153/80 mmHg (11/04 0520) Pulse Rate: 80  (11/04 0520)  Labs:  Basename 04/14/11 0500 04/13/11 0615 04/12/11 0432  HGB 9.3* 8.8* --  HCT 27.8* 27.2* --  PLT 307 292 --  APTT -- -- --  LABPROT 17.2* 16.2* --  INR 1.38 1.27 --  HEPARINUNFRC -- -- --  CREATININE -- 0.60 0.73  CKTOTAL -- -- --  CKMB -- -- --  TROPONINI -- -- --   Estimated Creatinine Clearance: 77.6 ml/min (by C-G formula based on Cr of 0.6).   Medications:  Warfarin doses since 04/12/11: 5mg , 5mg  PO.  Scheduled:    . Chlorhexidine Gluconate Cloth  6 each Topical Q0600  . docusate sodium  100 mg Oral BID  . hydrochlorothiazide  12.5 mg Oral Daily  . labetalol  300 mg Oral BID  . lisinopril  20 mg Oral Daily  . mupirocin   Nasal BID  . polysaccharide iron  150 mg Oral Daily  . predniSONE  10 mg Oral AC breakfast  . predniSONE  10 mg Oral Nightly  . predniSONE  10 mg Oral Nightly  . predniSONE  5 mg Oral PC lunch  . predniSONE  5 mg Oral PC supper  . predniSONE  5 mg Oral 3 x daily with food  . predniSONE  5 mg Oral 4X daily taper  . rifampin  300 mg Oral Daily  . sodium chloride  10 mL Intravenous Q12H  . triamcinolone   Topical BID  . vancomycin  1,000 mg Intravenous Q12H  . warfarin  5 mg Oral q1800   Infusions:    . sodium chloride     PRN: acetaminophen, acetaminophen, alum & mag hydroxide-simeth, bisacodyl, bisacodyl, diphenhydrAMINE, HYDROmorphone (DILAUDID) injection, menthol-cetylpyridinium, methocarbamol(ROBAXIN) IV, methocarbamol, morphine, ondansetron (ZOFRAN) IV, ondansetron, oxyCODONE, phenol, sodium chloride, sodium phosphate,  zolpidem  Assessment:  Warfarin initiation in progress (switched from Xarelto due to drug interaction with rifampin).  INR responding slowly.  Warfarin requirements could ultimately be larger than usual once rifampin effects on drug absorption and metabolism are fully manifest.  Goal of Therapy:   INR 2-3   Plan:   Boost with warfarin 7.5mg  PO today.  Follow Protime/INR daily.  **Note:  Pharmacy is also available to assist with inpatient Vancomycin dosing if desired.  Diyari Cherne, Brynda Greathouse K 04/14/2011,10:16 AM

## 2011-04-14 NOTE — Plan of Care (Signed)
Problem: Phase I Progression Outcomes Goal: Initial discharge plan identified Outcome: Completed/Met Date Met:  04/14/11 Plans d/c Monday (?)

## 2011-04-14 NOTE — Progress Notes (Signed)
Physical Therapy Treatment Patient Details Name: [name] MRN: [id number] DOB: 1941/01/06 Today's Date: 04/14/2011 Time: 1202-1230 Charge: Elouise Munroe PT Assessment/Plan  PT - Assessment/Plan PT Plan: Discharge plan remains appropriate;Frequency remains appropriate PT Frequency: 7X/week Follow Up Recommendations: Skilled nursing facility Equipment Recommended: Defer to next venue PT Goals  Acute Rehab PT Goals PT Goal Formulation: With patient Time For Goal Achievement: 7 days Pt will go Supine/Side to Sit: with supervision PT Goal: Supine/Side to Sit - Progress: Progressing toward goal Pt will Transfer Sit to Stand/Stand to Sit: with supervision PT Transfer Goal: Sit to Stand/Stand to Sit - Progress: Progressing toward goal Pt will Ambulate: 16 - 50 feet;with supervision;with rolling walker (50 feet) PT Goal: Ambulate - Progress: Progressing toward goal Pt will Go Up / Down Stairs: 3-5 stairs;with least restrictive assistive device;with min assist PT Goal: Up/Down Stairs - Progress: Not met (may discontinue depending if pt D/C to SNF)  PT Treatment Precautions/Restrictions  Precautions Precautions: Knee Required Braces or Orthoses: Yes Knee Immobilizer: Discontinue once straight leg raise with < 10 degree lag Restrictions Weight Bearing Restrictions: Yes RLE Weight Bearing: Weight bearing as tolerated Mobility (including Balance) Bed Mobility Bed Mobility: Yes Supine to Sit: 1: +2 Total assist;HOB elevated (Comment degrees);Patient percentage (comment) Supine to Sit Details (indicate cue type and reason): verbal cues for technique.  +2 total assist (pt = 30%) HOB elevated approximately 50 degrees Transfers Transfers: Yes Sit to Stand: 1: +2 Total assist;From elevated surface;Patient percentage (comment);From bed Sit to Stand Details (indicate cue type and reason): Verbal cues for hand placement and R LE forward; +2 total assist (pt= 80%) Stand to Sit: 1: +2 Total  assist;Patient percentage (comment);With armrests Stand to Sit Details: Verbal cues for hand placement and R LE forward; +2 total assist (pt= 80%); manual assist for R LE forward and controlling descent Ambulation/Gait Ambulation/Gait: Yes Ambulation/Gait Assistance: 1: +2 Total assist;Patient percentage (comment) Ambulation/Gait Assistance Details (indicate cue type and reason): (pt = 70%) verbal cues for sequence, pt with very short step length secondary to pain; verbal cues for safe RW distance Ambulation Distance (Feet): 4 Feet Assistive device: Rolling walker Gait Pattern: Step-to pattern    Exercise  Total Joint Exercises Ankle Circles/Pumps: AROM;Strengthening;Both;20 reps;Seated (Pt performed exercises reclined in recliner.) Quad Sets: AROM;Strengthening;Right;20 reps;Seated Short Arc Quad: AAROM;Strengthening;Right (20 reps) Heel Slides: AAROM;Strengthening;Right;20 reps;Seated Hip ABduction/ADduction: AAROM;Right;20 reps;Seated End of Session PT - End of Session Equipment Utilized During Treatment: Gait belt;Right knee immobilizer Activity Tolerance: Patient limited by pain Patient left: in chair;with call bell in reach;with family/visitor present General Behavior During Session: [facility] for tasks performed Cognition: [facility] for tasks performed  [facility] 04/14/2011, 1:55 PM

## 2011-04-14 NOTE — Plan of Care (Signed)
Problem: Consults Goal: Diagnosis- Total Joint Replacement Outcome: Completed in [facility] Date Met:  04/14/11 Primary Total Knee I&D, Poly Exchange

## 2011-04-14 NOTE — Progress Notes (Signed)
Subjective:     Patient reports pain as severe.  pain localized to right buttock. Patient reports history of spinal stenosis and has been referred to "spine surgeon".  Objective: Vital signs in last 24 hours: Temp:  [98.2 F (36.8 C)-98.6 F (37 C)] 98.2 F (36.8 C) (11/04 0520) Pulse Rate:  [79-111] 80  (11/04 0520) Resp:  [18-20] 20  (11/04 0520) BP: (144-157)/(67-83) 153/80 mmHg (11/04 0520) SpO2:  [94 %-98 %] 97 % (11/04 0520)  Intake/Output from previous day: 11/03 0701 - 11/04 0700 In: 1370 [P.O.:680; I.V.:490; IV Piggyback:200] Out: 1610 [Urine:3875; Stool:1] Intake/Output this shift:     Basename 04/14/11 0500 04/13/11 0615  HGB 9.3* 8.8*    Basename 04/14/11 0500 04/13/11 0615  WBC 10.4 10.3  RBC 3.35* 3.22*  HCT 27.8* 27.2*  PLT 307 292    Basename 04/13/11 0615 04/12/11 0432  NA 135 135  K 3.7 2.9*  CL 97 95*  CO2 31 34*  BUN 8 6  CREATININE 0.60 0.73  GLUCOSE 121* 114*  CALCIUM 8.8 8.6    Basename 04/14/11 0500 04/13/11 0615  LABPT -- --  INR 1.38 1.27    Right leg diffusely tender to palpation. Knee incision clean and dry minimal swelling. Positive sciatic tension signs  Assessment/Plan:     S/p r tka  r le pain with probable radiculopathy Will start dosepak  [name] 04/14/2011, 8:17 AM

## 2011-04-14 NOTE — Plan of Care (Signed)
Problem: Phase I Progression Outcomes Goal: [place]angle evening of surgery See echart care plan prior to [facility] go live

## 2011-04-14 NOTE — Progress Notes (Signed)
Occupational Therapy Treatment Patient Details Name: [name] MRN: [id number] DOB: 1940-08-01 Today's Date: 04/14/2011  OT Assessment/Plan OT Assessment/Plan OT Plan: Discharge plan needs to be updated (Pt requires ST rehab in SNF as she needs +2 assist) OT Frequency: Min 2X/week Follow Up Recommendations: Skilled nursing facility Equipment Recommended: Defer to next venue OT Goals Acute Rehab OT Goals OT Goal Formulation: With patient/family ADL Goals Pt Will Perform Grooming: Standing at sink;with mod assist (1 activity) ADL Goal: Grooming - Progress: Revised (modified due to lack of progress/goal met) Pt Will Perform Lower Body Bathing: with mod assist;Sitting, edge of bed;Sit to stand from bed;with adaptive equipment ADL Goal: Lower Body Bathing - Progress: Revised (modified due to lack of progress/goal met) Pt Will Perform Lower Body Dressing: with mod assist;Sit to stand from bed;Sitting, bed;with adaptive equipment ADL Goal: Lower Body Dressing - Progress: Revised (modified due to lack of progress/goal met) Pt Will Transfer to Toilet: with mod assist;3-in-1 ADL Goal: Toilet Transfer - Progress: Revised (modified due to lack of progress/goal met) Pt Will Perform Toileting - Hygiene: with set-up;Sitting on 3-in-1 or toilet ADL Goal: Toileting - Hygiene - Progress: Revised (modified due to lack of progress/goal met)  OT Treatment Precautions/Restrictions  Precautions Precautions: Knee Required Braces or Orthoses: Yes Knee Immobilizer: Discontinue once straight leg raise with < 10 degree lag Restrictions Weight Bearing Restrictions: Yes RLE Weight Bearing: Weight bearing as tolerated   ADL ADL Grooming: Performed;Set up (washed hands after toileting) Where Assessed - Grooming: Sitting, bed;Unsupported Toilet Transfer: Performed;+2 Total assistance (60%) Toilet Transfer Method: Stand pivot Toilet Transfer Equipment: Set designer - Hygiene: Moderate  assistance Toileting - Hygiene Details (indicate cue type and reason):  (for back pericare ) Where Assessed - Toileting Hygiene: Standing Mobility  Bed Mobility Bed Mobility: Yes Supine to Sit: 1: +2 Total assist;Other (comment) (40%) Supine to Sit Details (indicate cue type and reason): verbal cues for technique.  +2 total assist (pt = 30%) HOB elevated approximately 50 degrees Transfers Transfers: Yes Sit to Stand: 1: +2 Total assist;From chair/3-in-1 (flexed posture in standing required verbal cues) Sit to Stand Details (indicate cue type and reason): Verbal cues for hand placement and R LE forward; +2 total assist (pt= 80%) Stand to Sit: 1: +2 Total assist;Patient percentage (comment);With armrests Stand to Sit Details: Verbal cues for hand placement and R LE forward; +2 total assist (pt= 80%); manual assist for R LE forward and controlling descent   End of Session OT - End of Session Equipment Utilized During Treatment: Gait belt (RW) Activity Tolerance: Patient limited by pain Patient left: in bed;with family/visitor present General Behavior During Session: [facility] for tasks performed Cognition: [facility] for tasks performed  Evern Bio  04/14/2011, 3:08 PM

## 2011-04-14 NOTE — Plan of Care (Signed)
Problem: Discharge Progression Outcomes Goal: Anticoagulant follow-up in place Outcome: Completed/Met Date Met:  04/14/11 coumadin

## 2011-04-14 NOTE — Plan of Care (Signed)
Problem: Phase II Progression Outcomes Goal: Ambulates Outcome: Progressing Ambulation limited by increased R hip/SI joint pain as well as R knee pain.

## 2011-04-15 LAB — CBC
HCT: 28.1 % — ABNORMAL LOW (ref 36.0–46.0)
Hemoglobin: 9.2 g/dL — ABNORMAL LOW (ref 12.0–15.0)
RBC: 3.35 MIL/uL — ABNORMAL LOW (ref 3.87–5.11)
WBC: 9.7 10*3/uL (ref 4.0–10.5)

## 2011-04-15 LAB — PROTIME-INR: INR: 1.78 — ABNORMAL HIGH (ref 0.00–1.49)

## 2011-04-15 MED ORDER — OXYCODONE HCL 5 MG PO TABS
ORAL_TABLET | ORAL | Status: AC
  Administered 2011-04-15: 10 mg via ORAL
  Filled 2011-04-15: qty 2

## 2011-04-15 MED ORDER — METHOCARBAMOL 500 MG PO TABS
ORAL_TABLET | ORAL | Status: AC
  Administered 2011-04-15: 500 mg via ORAL
  Filled 2011-04-15: qty 1

## 2011-04-15 MED ORDER — WARFARIN SODIUM 5 MG PO TABS
5.0000 mg | ORAL_TABLET | Freq: Once | ORAL | Status: AC
  Administered 2011-04-15: 5 mg via ORAL
  Filled 2011-04-15: qty 1

## 2011-04-15 NOTE — Progress Notes (Signed)
ANTICOAGULATION CONSULT NOTE - Follow Up Consult  Pharmacy Consult for Warfarin Indication: VTE prophylaxis s/p knee arthrotomy (May 2012)  No Known Allergies  Patient Measurements: Height: 5' 5.35" (166 cm) Weight: 210 lb 1.6 oz (95.3 kg) IBW/kg (Calculated) : 57.81    Vital Signs: Temp: 97.6 F (36.4 C) (11/05 0610) BP: 178/89 mmHg (11/05 0610) Pulse Rate: 74  (11/05 0610)  Labs:  Basename 04/15/11 0500 04/14/11 0500 04/13/11 0615  HGB 9.2* 9.3* --  HCT 28.1* 27.8* 27.2*  PLT 319 307 292  APTT -- -- --  LABPROT 21.0* 17.2* 16.2*  INR 1.78* 1.38 1.27  HEPARINUNFRC -- -- --  CREATININE -- -- 0.60  CKTOTAL -- -- --  CKMB -- -- --  TROPONINI -- -- --   Estimated Creatinine Clearance: 77.4 ml/min (by C-G formula based on Cr of 0.6).   Medications:  Drug Interactions: prednisone (increase INR) and rifampin (decrease INR) Patient was on Xarelto PTA.  Assessment:  Warfarin initiation in progress (switched from Xarelto due to drug interaction with rifampin). Abx for infected knee with hardware.  INR increased substantially from 7.5mg  dose yesterday (boost dose) and would like to try 5mg  again in anticipation of INR>2 tomorrow. INR response to warfarin may be difficult to interpret in light of drug interactions.  Goal of Therapy:  INR 2-3   Plan:  Warfarin 5mg  po once today. F/u INR in AM. Plans for d/c today. Would recommend daily INR checks by home health until INR stable on coumadin.  Clance Boll 04/15/2011,8:19 AM

## 2011-04-15 NOTE — Progress Notes (Signed)
Physical Therapy Treatment Patient Details Name: [name] MRN: [id number] DOB: Oct 10, 1940 Today's Date: 04/15/2011 13;10 - 13:25 1 ta PT Assessment/Plan  PT - Assessment/Plan PT Plan: Discharge plan remains appropriate PT Frequency: 7X/week Follow Up Recommendations: Skilled nursing facility Equipment Recommended: Defer to next venue PT Goals  Acute Rehab PT Goals PT Goal Formulation: With patient Time For Goal Achievement: 7 days Pt will go Supine/Side to Sit: with supervision Pt will Transfer Sit to Stand/Stand to Sit: with supervision Pt will Ambulate: 16 - 50 feet;with supervision;with rolling walker (50 feet) Pt will Go Up / Down Stairs: 3-5 stairs;with least restrictive assistive device;with min assist  PT Treatment Precautions/Restrictions  Precautions Precautions: Knee Precaution Booklet Issued: No Required Braces or Orthoses: Yes Knee Immobilizer: Discontinue once straight leg raise with < 10 degree lag Restrictions Weight Bearing Restrictions: No RLE Weight Bearing: Weight bearing as tolerated Mobility (including Balance) Bed Mobility Bed Mobility: Yes Supine to Sit: 2: Max assist Supine to Sit Details (indicate cue type and reason): required increased time and use of rails Transfers Transfers: Yes Sit to Stand: 1: +2 Total assist Sit to Stand Details (indicate cue type and reason): pt 50% with incresded time Stand to Sit: 2: Max assist Stand to Sit Details: max assist to support R le Ambulation/Gait Ambulation/Gait: Yes Ambulation/Gait Assistance: 1: +2 Total assist Ambulation/Gait Assistance Details (indicate cue type and reason): max c/o right hip pain, "deep", "sharpe" pt unable to continue ambulation Ambulation Distance (Feet): 2 Feet Assistive device: Rolling walker Gait Pattern: Step-to pattern;Decreased stance time - right;Trunk flexed Stairs: No Wheelchair Mobility Wheelchair Mobility: No    Exercise    End of Session PT - End of  Session Equipment Utilized During Treatment: Gait belt Activity Tolerance: Patient limited by pain Patient left: in chair;with family/visitor present;with call bell in reach Nurse Communication: Mobility status for ambulation General Behavior During Session: Other (comment) (pt concerned and discouraged) Cognition: [facility] for tasks performed  Armando Reichert 04/15/2011, 1:44 PM

## 2011-04-15 NOTE — Progress Notes (Signed)
Subjective:     Patient reports pain as moderate.   Patient seen in rounds with Dr. Lequita Halt. Patient has complaints of continued back and buttock pain.  She was started on a dosepack and we will see how she does with that.  She still needs more therapy and not ready for home.  We will reevaluate tomorrow.  Objective: Vital signs in last 24 hours: Temp:  [97.6 F (36.4 C)-97.9 F (36.6 C)] 97.6 F (36.4 C) (11/05 0610) Pulse Rate:  [74-77] 74  (11/05 0610) Resp:  [17-18] 17  (11/05 0610) BP: (147-178)/(74-96) 170/96 mmHg (11/05 0900) SpO2:  [95 %] 95 % (11/05 0610) Weight:  [95.3 kg (210 lb 1.6 oz)] 210 lb 1.6 oz (95.3 kg) (11/05 0500)  Intake/Output from previous day:  Intake/Output Summary (Last 24 hours) at 04/15/11 1103 Last data filed at 04/15/11 0900  Gross per 24 hour  Intake 2008.67 ml  Output   1850 ml  Net 158.67 ml    Intake/Output this shift: Total I/O In: 300 [P.O.:300] Out: 200 [Urine:200]   Basename 04/15/11 0500 04/14/11 0500 04/13/11 0615  HGB 9.2* 9.3* 8.8*    Basename 04/15/11 0500 04/14/11 0500  WBC 9.7 10.4  RBC 3.35* 3.35*  HCT 28.1* 27.8*  PLT 319 307    Basename 04/13/11 0615  NA 135  K 3.7  CL 97  CO2 31  BUN 8  CREATININE 0.60  GLUCOSE 121*  CALCIUM 8.8    Basename 04/15/11 0500 04/14/11 0500  LABPT -- --  INR 1.78* 1.38   Right Leg: Neurovascular intact Sensation intact distally Intact pulses distally Incision: no drainage Wound: clean, dry, no drainage, healing Motor function intact - moving foot and toes well on exam.   Assessment/Plan:  S/P I & D/Poly Exchange Right Total Knee Low Back with Radicular Pain, History of Stenosis     Up with therapy Plan for discharge tomorrow if doing better. Past Medical History  Diagnosis Date  . Rotator cuff syndrome   . DJD (degenerative joint disease)     generalized  . Dyslipidemia   . Diabetes mellitus     borderline  . Squamous cell carcinoma   . Osteoarthritis   .  HTN (hypertension)   . Dyspnea    DVT Prophylaxis -Coumadin Protocol   PERKINS, ALEXZANDREW 04/15/2011, 11:03 AM

## 2011-04-15 NOTE — Progress Notes (Signed)
Subjective:     Patient reports pain as moderate.   Patient seen in rounds with Dr. Lequita Halt. Patient has complaints of continued pain with with back and buttock area.  Started on a dosepack and will see how she does.  She still needs more therapy and is not ready for discharge as of yet.  Will keep today and reevaluate tomorrow.  Objective: Vital signs in last 24 hours: Temp:  [97.6 F (36.4 C)-97.9 F (36.6 C)] 97.6 F (36.4 C) (11/05 0610) Pulse Rate:  [74-77] 74  (11/05 0610) Resp:  [17-18] 17  (11/05 0610) BP: (147-178)/(74-96) 170/96 mmHg (11/05 0900) SpO2:  [95 %] 95 % (11/05 0610) Weight:  [95.3 kg (210 lb 1.6 oz)] 210 lb 1.6 oz (95.3 kg) (11/05 0500)  Intake/Output from previous day:  Intake/Output Summary (Last 24 hours) at 04/15/11 1053 Last data filed at 04/15/11 0900  Gross per 24 hour  Intake 2008.67 ml  Output   1850 ml  Net 158.67 ml    Intake/Output this shift: Total I/O In: 300 [P.O.:300] Out: 200 [Urine:200]   Basename 04/15/11 0500 04/14/11 0500 04/13/11 0615  HGB 9.2* 9.3* 8.8*    Basename 04/15/11 0500 04/14/11 0500  WBC 9.7 10.4  RBC 3.35* 3.35*  HCT 28.1* 27.8*  PLT 319 307    Basename 04/13/11 0615  NA 135  K 3.7  CL 97  CO2 31  BUN 8  CREATININE 0.60  GLUCOSE 121*  CALCIUM 8.8    Basename 04/15/11 0500 04/14/11 0500  LABPT -- --  INR 1.78* 1.38    Neurovascular intact Sensation intact distally Intact pulses distally No cellulitis present Compartment soft Incision: clean, dry, no drainage Motor function intact - moving foot and toes well on exam.   Assessment/Plan:     Up with therapy Past Medical History  Diagnosis Date  . Rotator cuff syndrome   . DJD (degenerative joint disease)     generalized  . Dyslipidemia   . Diabetes mellitus     borderline  . Squamous cell carcinoma   . Osteoarthritis   . HTN (hypertension)   . Dyspnea    DVT Prophylaxis - Coumadin Protocol   PERKINS, ALEXZANDREW 04/15/2011, 10:53  AM

## 2011-04-16 ENCOUNTER — Inpatient Hospital Stay (HOSPITAL_COMMUNITY): Payer: Medicare Other

## 2011-04-16 LAB — CBC
HCT: 28.2 % — ABNORMAL LOW (ref 36.0–46.0)
Hemoglobin: 9.3 g/dL — ABNORMAL LOW (ref 12.0–15.0)
RDW: 16.3 % — ABNORMAL HIGH (ref 11.5–15.5)
WBC: 8.1 10*3/uL (ref 4.0–10.5)

## 2011-04-16 LAB — PROTIME-INR
INR: 1.71 — ABNORMAL HIGH (ref 0.00–1.49)
Prothrombin Time: 20.4 seconds — ABNORMAL HIGH (ref 11.6–15.2)

## 2011-04-16 MED ORDER — WARFARIN SODIUM 7.5 MG PO TABS
7.5000 mg | ORAL_TABLET | Freq: Once | ORAL | Status: AC
  Administered 2011-04-16: 7.5 mg via ORAL
  Filled 2011-04-16: qty 1

## 2011-04-16 NOTE — Progress Notes (Signed)
ANTICOAGULATION CONSULT NOTE - Follow Up Consult  Pharmacy Consult for Warfarin Indication: VTE prophylaxis s/p knee arthrotomy (May 2012)  No Known Allergies  Patient Measurements: Height: 5' 5.35" (166 cm) Weight: 210 lb 1.6 oz (95.3 kg) IBW/kg (Calculated) : 57.81    Vital Signs: Temp: 98.3 F (36.8 C) (11/06 0525) Temp src: Oral (11/06 0525) BP: 145/75 mmHg (11/06 0525) Pulse Rate: 69  (11/06 0525)  Labs:  Basename 04/16/11 0355 04/15/11 0500 04/14/11 0500  HGB 9.3* 9.2* --  HCT 28.2* 28.1* 27.8*  PLT 326 319 307  APTT -- -- --  LABPROT 20.4* 21.0* 17.2*  INR 1.71* 1.78* 1.38  HEPARINUNFRC -- -- --  CREATININE -- -- --  CKTOTAL -- -- --  CKMB -- -- --  TROPONINI -- -- --   Estimated Creatinine Clearance: 77.4 ml/min (by C-G formula based on Cr of 0.6).   Medications:  Drug Interactions: prednisone taper (increase INR) and rifampin (decrease INR)  Patient was on Xarelto PTA.  Assessment: Warfarin initiation in progress (switched from Xarelto due to drug interaction with rifampin). Abx for infected knee with hardware. INR response to warfarin may be difficult to interpret in light of drug interactions. INR still subtherapeutic - was more responsive to the 7.5mg  dose. No bleeding/complications reported. Goal of Therapy:  INR 2-3   Plan:  Warfarin 7.5mg  po once today. F/u INR in AM. Plans for d/c today. Would recommend daily INR checks by home health until INR stable on coumadin.   Clance Boll 04/16/2011,7:43 AM

## 2011-04-16 NOTE — Progress Notes (Signed)
Subjective:     Patient reports pain as mild and moderate.   Patient seen in rounds with Dr. Lequita Halt. Patient has complaints of pain still in her back and posterior hip/buttock area on the right.  MRI has been ordered.  Patient has spinal stenosis.  Keep today. The right knee is doing better.  Objective: Vital signs in last 24 hours: Temp:  [97.8 F (36.6 C)-98.3 F (36.8 C)] 98.3 F (36.8 C) (11/06 0525) Pulse Rate:  [69-79] 69  (11/06 0525) Resp:  [16-18] 18  (11/06 0525) BP: (145-183)/(75-80) 145/75 mmHg (11/06 0525) SpO2:  [95 %-97 %] 97 % (11/06 0525)  Intake/Output from previous day:  Intake/Output Summary (Last 24 hours) at 04/16/11 0917 Last data filed at 04/16/11 0748  Gross per 24 hour  Intake 746.33 ml  Output    750 ml  Net  -3.67 ml    Intake/Output this shift: Total I/O In: -  Out: 200 [Urine:200]   Basename 04/16/11 0355 04/15/11 0500 04/14/11 0500  HGB 9.3* 9.2* 9.3*    Basename 04/16/11 0355 04/15/11 0500  WBC 8.1 9.7  RBC 3.33* 3.35*  HCT 28.2* 28.1*  PLT 326 319   No results found for this basename: NA:2,K:2,CL:2,CO2:2,BUN:2,CREATININE:2,GLUCOSE:2,CALCIUM:2 in the last 72 hours  Basename 04/16/11 0355 04/15/11 0500  LABPT -- --  INR 1.71* 1.78*   Right Leg: Neurovascular intact Sensation intact distally Intact pulses distally No cellulitis present Compartment soft Right Knee Incision - clean, dry, no drainage, healed Motor function intact - moving foot and toes well on exam.   Assessment/Plan: S/P I&D and Poly Exchange Right Total Knee Spinal Stenosis Low Back Pain with Radicular Pain     Up with therapy Past Medical History  Diagnosis Date  . Rotator cuff syndrome   . DJD (degenerative joint disease)     generalized  . Dyslipidemia   . Diabetes mellitus     borderline  . Squamous cell carcinoma   . Osteoarthritis   . HTN (hypertension)   . Dyspnea    DVT Prophylaxis - Coumadin Protocol MRI pending.  PERKINS,  ALEXZANDREW 04/16/2011, 9:17 AM

## 2011-04-16 NOTE — Plan of Care (Signed)
Problem: Phase III Progression Outcomes Goal: Ambulate BID Outcome: Progressing poorly  Problem: Problem: Mobility Progression Goal: INCREASED MOBILITY OR STRENGTH Outcome: Progressing Very slowly

## 2011-04-16 NOTE — Progress Notes (Addendum)
Physical Therapy Treatment Patient Details Name: ARBOR LEER MRN: [id number] DOB: May 24, 1941 Today's Date: 04/16/2011 12:25 - 12:40 1 te PT Assessment/Plan  PT - Assessment/Plan PT Plan: Discharge plan remains appropriate PT Goals  Acute Rehab PT Goals PT Goal Formulation: With patient PT Goal: Supine/Side to Sit - Progress: Progressing toward goal PT Transfer Goal: Sit to Stand/Stand to Sit - Progress: Progressing toward goal PT Goal: Ambulate - Progress: Progressing toward goal PT Goal: Up/Down Stairs - Progress: Progressing toward goal  PT Treatment Precautions/Restrictions  Precautions Precautions: Knee Precaution Booklet Issued: No Required Braces or Orthoses: Yes Knee Immobilizer: Discontinue once straight leg raise with < 10 degree lag Restrictions Weight Bearing Restrictions: No RLE Weight Bearing: Weight bearing as tolerated Mobility (including Balance) Bed Mobility Bed Mobility: No Transfers Transfers: No Ambulation/Gait Ambulation/Gait: No Stairs: No Wheelchair Mobility Wheelchair Mobility: No    Exercise  Total Joint Exercises Ankle Circles/Pumps: AROM;Both;15 reps Quad Sets: AROM;Both;15 reps Gluteal Sets: AROM;Both;10 reps Towel Squeeze: AROM;Both;10 reps Short Arc QuadBarbaraann Boys;Right;10 reps Heel Slides: AAROM;Right;10 reps Hip ABduction/ADduction: AROM;Right;10 reps Straight Leg Raises: Other (comment) (did not perform 2nd right hip pain) End of Session PT - End of Session Activity Tolerance: Patient limited by pain Patient left: in bed Nurse Communication: Other (comment) (performed TE's only 2nd awaiting MRI) General Behavior During Session: [facility] for tasks performed Cognition: [facility] for tasks performed  Armando Reichert 04/16/2011, 12:45 PM

## 2011-04-17 DIAGNOSIS — M48061 Spinal stenosis, lumbar region without neurogenic claudication: Secondary | ICD-10-CM

## 2011-04-17 HISTORY — DX: Spinal stenosis, lumbar region without neurogenic claudication: M48.061

## 2011-04-17 LAB — PROTIME-INR
INR: 1.77 — ABNORMAL HIGH (ref 0.00–1.49)
Prothrombin Time: 20.9 seconds — ABNORMAL HIGH (ref 11.6–15.2)
Prothrombin Time: 20.5 seconds — ABNORMAL HIGH (ref 11.6–15.2)

## 2011-04-17 LAB — CBC
HCT: 29.7 % — ABNORMAL LOW (ref 36.0–46.0)
Hemoglobin: 9.6 g/dL — ABNORMAL LOW (ref 12.0–15.0)
RDW: 16.1 % — ABNORMAL HIGH (ref 11.5–15.5)
WBC: 8.7 10*3/uL (ref 4.0–10.5)

## 2011-04-17 LAB — DIFFERENTIAL
Eosinophils Absolute: 0 10*3/uL (ref 0.0–0.7)
Eosinophils Relative: 0 % (ref 0–5)
Lymphs Abs: 1.3 10*3/uL (ref 0.7–4.0)
Monocytes Absolute: 1 10*3/uL (ref 0.1–1.0)
Monocytes Relative: 12 % (ref 3–12)

## 2011-04-17 LAB — PREPARE RBC (CROSSMATCH)

## 2011-04-17 MED ORDER — CEFAZOLIN SODIUM-DEXTROSE 2-3 GM-% IV SOLR
2.0000 g | INTRAVENOUS | Status: DC
  Filled 2011-04-17: qty 50

## 2011-04-17 MED ORDER — WARFARIN SODIUM 7.5 MG PO TABS: 7.5000 mg | ORAL_TABLET | Freq: Once | ORAL | Status: DC

## 2011-04-17 MED ORDER — WARFARIN SODIUM 10 MG PO TABS
10.0000 mg | ORAL_TABLET | Freq: Once | ORAL | Status: DC
  Filled 2011-04-17: qty 1

## 2011-04-17 MED ORDER — LACTATED RINGERS IV SOLN: INTRAVENOUS | Status: DC

## 2011-04-17 MED ORDER — VITAMINS A & D EX OINT
TOPICAL_OINTMENT | CUTANEOUS | Status: AC
  Filled 2011-04-17: qty 10

## 2011-04-17 NOTE — Progress Notes (Signed)
Subjective:    Procedure(s) (LRB): LUMBAR LAMINECTOMY/DECOMPRESSION MICRODISCECTOMY (N/A) Patient reports pain as moderate and severe.   Patient seen in rounds with Dr. Lequita Halt. Patient has complaints of continued pain.  MRI has been done.  Seen by Dr. Darrelyn Hillock and it is felt that she will require surgery.  Plan for near future.  Objective: Vital signs in last 24 hours: Temp:  [97.9 F (36.6 C)-98.6 F (37 C)] 98.6 F (37 C) (11/07 1350) Pulse Rate:  [70-80] 80  (11/07 1350) Resp:  [18-24] 18  (11/07 1350) BP: (150-196)/(77-94) 150/77 mmHg (11/07 1350) SpO2:  [91 %-96 %] 96 % (11/07 1350)  Intake/Output from previous day:  Intake/Output Summary (Last 24 hours) at 04/17/11 1614 Last data filed at 04/17/11 1300  Gross per 24 hour  Intake 1618.67 ml  Output      2 ml  Net 1616.67 ml    Intake/Output this shift: Total I/O In: 650 [P.O.:480; I.V.:170] Out: 1 [Stool:1]   Basename 04/17/11 0419 04/16/11 0355 04/15/11 0500  HGB 9.6* 9.3* 9.2*    Basename 04/17/11 0419 04/16/11 0355  WBC 8.7 8.1  RBC 3.50* 3.33*  HCT 29.7* 28.2*  PLT 353 326   No results found for this basename: NA:2,K:2,CL:2,CO2:2,BUN:2,CREATININE:2,GLUCOSE:2,CALCIUM:2 in the last 72 hours  Basename 04/17/11 0419 04/16/11 0355  LABPT -- --  INR 1.77* 1.71*    Exam - Neurovascular intact Sensation intact distally Intact pulses distally Dressing/Incision - clean, dry, no drainage, healed Motor function intact - moving foot and toes well on exam.   Assessment/Plan:   Procedure(s) (LRB): LUMBAR LAMINECTOMY/DECOMPRESSION MICRODISCECTOMY (N/A)   Past Medical History  Diagnosis Date  . Rotator cuff syndrome   . DJD (degenerative joint disease)     generalized  . Dyslipidemia   . Diabetes mellitus     borderline  . Squamous cell carcinoma   . Osteoarthritis   . HTN (hypertension)   . Dyspnea     DVT Prophylaxis - Coumadin Protocol - Place on HOLD for now.  She will require back surgery by Dr.  Darrelyn Hillock.  PERKINS, ALEXZANDREW 04/17/2011, 4:14 PM

## 2011-04-17 NOTE — Progress Notes (Signed)
ANTICOAGULATION CONSULT NOTE - Follow Up Consult  Pharmacy Consult for Warfarin Indication: VTE prophylaxis s/p knee arthrotomy (May 2012)  No Known Allergies  Patient Measurements: Height: 5' 5.35" (166 cm) Weight: 210 lb 1.6 oz (95.3 kg) IBW/kg (Calculated) : 57.81    Vital Signs: Temp: 98.1 F (36.7 C) (11/07 0555) Temp src: Oral (11/07 0555) BP: 196/81 mmHg (11/07 0555) Pulse Rate: 70  (11/07 0555)  Labs:  Basename 04/17/11 0419 04/16/11 0355 04/15/11 0500  HGB 9.6* 9.3* --  HCT 29.7* 28.2* 28.1*  PLT 353 326 319  APTT -- -- --  LABPROT 20.9* 20.4* 21.0*  INR 1.77* 1.71* 1.78*  HEPARINUNFRC -- -- --  CREATININE -- -- --  CKTOTAL -- -- --  CKMB -- -- --  TROPONINI -- -- --   Estimated Creatinine Clearance: 77.4 ml/min (by C-G formula based on Cr of 0.6).   Medications:  Drug Interactions: prednisone taper (increase INR) and rifampin (decrease INR)  Patient was on Xarelto PTA.  Assessment: Warfarin initiation in progress (switched from Xarelto due to drug interaction with rifampin). Abx for infected knee with hardware. INR response to warfarin may be difficult to interpret in light of drug interactions. INR still subtherapeutic, possibly in response to rifampin now. May need higher warfarin doses. No bleeding/complications reported. Goal of Therapy:  INR 2-3   Plan:  Warfarin 10 mg po once today. F/u INR in AM. Would recommend daily INR checks by home health until INR stable on coumadin, considering rifampin drug interaction.   [name] 04/17/2011,11:24 AM

## 2011-04-17 NOTE — Progress Notes (Signed)
Blood to lab.

## 2011-04-17 NOTE — Consult Note (Signed)
NAMEMELANEY, TELLEFSEN NO.:  [id number]  MEDICAL RECORD NO.:  [id number]  LOCATION:  1604                         FACILITY:  [facility]  PHYSICIAN:  Georges Lynch. Ezabella Teska, M.D.DATE OF BIRTH:  27-Jan-1941  DATE OF CONSULTATION: DATE OF DISCHARGE:                                CONSULTATION   I was called by Dr. Lequita Halt today on April 17, 2011, to see and evaluate Ms. [name], who was an inpatient.  She has had previous surgery here by Dr. Lequita Halt in regards to her right knee replacement.  Her past history is all well-documented by Dr. Lequita Halt.  The reason I was called to see her was because of severe pain from her back, radiating into the right buttocks and down the right leg.  She said that she has had a history of back problems and stenosis.  She has had MRIs in the past.  She was also seen by my associate, Dr. Ethelene Hal.  She states that her pain is so severe that she cannot be discharged home because she cannot even get up to walk to  __________.  The physical exam from the orthopedic standpoint of her back.  Her back and spine, there are no signs of any previous incisions.  She does have severe pain with back motion.  The straight leg raising is positive on the right.  She has a healing incision over the anterior aspect of the right knee where she had a right knee surgery.  On the left, she had a previous left total knee operation.  Her calves were fine.  No signs of any deep venous thrombosis.  Her motor exam of the lower extremities is surprisingly intact, but very difficult to do a complete evaluation of the right lower extremity because of her previous right knee surgery as well as her pain.  Sensory exam is intact.  Circulation was intact.  I reviewed the MRI of her back.  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  IMPRESSION:  Severe spinal stenosis at L3-L4.  Note, she has had a history of intermittent claudication.  She has been on  Coumadin.  We stopped her Coumadin.  I did discuss that with Dr. Lequita Halt.  We will repeat INRs x2 weeks stay and she is scheduled for surgery on Friday, which is April 19, 2011.          ______________________________ Georges Lynch. Darrelyn Hillock, M.D.     RAG/MEDQ  D:  04/17/2011  T:  04/17/2011  Job:  [id number]

## 2011-04-18 LAB — PROTIME-INR
INR: 1.51 — ABNORMAL HIGH (ref 0.00–1.49)
INR: 1.58 — ABNORMAL HIGH (ref 0.00–1.49)
Prothrombin Time: 18.5 seconds — ABNORMAL HIGH (ref 11.6–15.2)

## 2011-04-18 LAB — CBC
Hemoglobin: 9.9 g/dL — ABNORMAL LOW (ref 12.0–15.0)
MCHC: 33 g/dL (ref 30.0–36.0)
WBC: 8.8 10*3/uL (ref 4.0–10.5)

## 2011-04-18 LAB — CREATININE, SERUM
GFR calc Af Amer: >90 mL/min (ref >90)
GFR calc non Af Amer: 88 mL/min — ABNORMAL LOW (ref >90)

## 2011-04-18 NOTE — Progress Notes (Signed)
Subjective:   Procedure(s) (LRB): LUMBAR LAMINECTOMY/DECOMPRESSION MICRODISCECTOMY (N/A) S/P I & D, Poly Exchange Right Total Knee  Patient reports pain as mild and moderate.   Patient seen in rounds with Dr. Lequita Halt. Patient has complaints of continued back and buttock pain.  MRI reviewed and will require decompression surgery by Dr. Darrelyn Hillock.  Objective: Vital signs in last 24 hours: Temp:  [97.9 F (36.6 C)-98.6 F (37 C)] 98.4 F (36.9 C) (11/08 0525) Pulse Rate:  [74-80] 80  (11/08 1014) Resp:  [18-24] 24  (11/08 0525) BP: (144-161)/(77-95) 144/95 mmHg (11/08 1014) SpO2:  [96 %-97 %] 97 % (11/08 0525)  Intake/Output from previous day:  Intake/Output Summary (Last 24 hours) at 04/18/11 1135 Last data filed at 04/18/11 1036  Gross per 24 hour  Intake   1940 ml  Output      0 ml  Net   1940 ml    Intake/Output this shift: Total I/O In: 240 [P.O.:240] Out: -    Basename 04/18/11 0710 04/17/11 0419 04/16/11 0355  HGB 9.9* 9.6* 9.3*    Basename 04/18/11 0710 04/17/11 0419  WBC 8.8 8.7  RBC 3.55* 3.50*  HCT 30.0* 29.7*  PLT 389 353   No results found for this basename: NA:2,K:2,CL:2,CO2:2,BUN:2,CREATININE:2,GLUCOSE:2,CALCIUM:2 in the last 72 hours  Basename 04/18/11 0710 04/17/11 1600  LABPT -- --  INR 1.51* 1.72*    Exam - Neurovascular intact Sensation intact distally Intact pulses distally No cellulitis present Dressing/Incision - clean, dry, no drainage, healed Motor function intact - moving foot and toes well on exam.   Assessment/Plan:   Procedure(s) (LRB): LUMBAR LAMINECTOMY/DECOMPRESSION MICRODISCECTOMY (N/A)   Past Medical History  Diagnosis Date  . Rotator cuff syndrome   . DJD (degenerative joint disease)     generalized  . Dyslipidemia   . Diabetes mellitus     borderline  . Squamous cell carcinoma   . Osteoarthritis   . HTN (hypertension)   . Dyspnea     DVT Prophylaxis - coumadin is on hold for pending surgery Weight-Bearing as  tolerated to right leg Surgery per Dr. Darrelyn Hillock.   PERKINS, ALEXZANDREW 04/18/2011, 11:35 AM

## 2011-04-18 NOTE — Progress Notes (Signed)
Occupational Therapy Evaluation Patient Details Name: [name] MRN: [id number] DOB: [birth year] Today's Date: 04/18/2011 2:03-2:32 OT Assessment/Plan OT Assessment/Plan Comments on Treatment Session: This 70 yo WF sp RTKR present to acute OT with pending back surgery tomorrow (04/19/11), pt now educated on how AE will work and will need to practice it to help her with BADLs secondary to knee surgery and back surgery/back precautions. Also now educated on how she should perform  bed mobility  post back surgery. OT Plan: Other (comment) (Will follow post new OT orders received sp back surgery) OT Frequency: Min 2X/week Follow Up Recommendations: Home health OT Equipment Recommended: Tub/shower bench;Other (comment) (Dtr-in-law says she has one she can borrow.) OT Goals ADL Goals ADL Goal: Grooming - Progress: Not addressed ADL Goal: Lower Body Bathing - Progress: Not addressed Pt Will Perform Lower Body Dressing: Other (comment);with adaptive equipment (Demonstratd to pt--will practice post back surgery) ADL Goal: Lower Body Dressing - Progress: Progressing toward goals ADL Goal: Toilet Transfer - Progress: Not addressed ADL Goal: Toileting - Hygiene - Progress: Not addressed  OT Treatment Precautions/Restrictions  Precautions Precautions: Knee Precaution Booklet Issued: No Required Braces or Orthoses: Yes Knee Immobilizer: On at all times;On except when in CPM;Discontinue once straight leg raise with < 10 degree lag Restrictions Weight Bearing Restrictions: No RLE Weight Bearing: Weight bearing as tolerated   ADL ADL ADL Comments: Showed pt and dtr-in-law AE kit so that they would know what the four pieces were. Also showed them how the sock aid worked--feel the patient will need these to get back to a I/Mod I level since knee surgery and upcoming back surgery tomorrow (04/18/2011) Mobility  Bed Mobility Bed Mobility: Yes Rolling Right: 3: Mod assist Rolling Right Details  (indicate cue type and reason): Cues for task and A. Went over this with her and familty since after back surgery she should be getting up by rolling L or R and going from sidelying to sit--all to protect her back. Rolling Left: 3: Mod assist Rolling Left Details (indicate cue type and reason): Cues for task and A. Went over this with her and familty since after back surgery she should be getting up by rolling L or R and going from sidelying to sit--all to protect her back. Transfers Transfers: No Exercises Total Joint Exercises Ankle Circles/Pumps: AROM;Both;10 reps Quad Sets: AROM;Both;10 reps Gluteal Sets: AROM;Both;10 reps Towel Squeeze: AROM;Both;10 reps Short Arc Quad: AROM;Right;10 reps Heel Slides: AAROM;Right;10 reps Hip ABduction/ADduction: AAROM;Right;10 reps Straight Leg Raises: AAROM;Right;10 reps  End of Session OT - End of Session Equipment Utilized During Treatment: Other (comment) (none--not OOB) Activity Tolerance: Patient tolerated treatment well Patient left: in bed;with call bell in reach;with family/visitor present;Other (comment) (dtr-in-law) General Behavior During Session: [facility] for tasks performed Cognition: [facility] for tasks performed  Evette Georges  04/18/2011, 3:09 PM

## 2011-04-18 NOTE — Progress Notes (Signed)
Physical Therapy Treatment Patient Details Name: [name] MRN: [id number] DOB: 29-Sep-1940 Today's Date: 04/18/2011 Time: 9:10 - 9:30 1 te PT Assessment/Plan  PT - Assessment/Plan Comments on Treatment Session: tolerated well....pain level better today PT Plan: Discharge plan remains appropriate PT Frequency: 7X/week Follow Up Recommendations: Home health PT Equipment Recommended: Rolling walker with 5" wheels PT Goals  Acute Rehab PT Goals PT Goal Formulation: With patient Time For Goal Achievement: 2 weeks Pt will go Supine/Side to Sit: with supervision PT Goal: Supine/Side to Sit - Progress: Progressing toward goal Pt will Transfer Sit to Stand/Stand to Sit: with supervision PT Transfer Goal: Sit to Stand/Stand to Sit - Progress: Progressing toward goal Pt will Ambulate: 16 - 50 feet;with supervision PT Goal: Ambulate - Progress: Progressing toward goal Pt will Go Up / Down Stairs: 3-5 stairs;with min assist;with least restrictive assistive device PT Goal: Up/Down Stairs - Progress: Progressing toward goal  PT Treatment Precautions/Restrictions  Precautions Precautions: Knee Precaution Booklet Issued: No Required Braces or Orthoses: Yes Knee Immobilizer: Discontinue once straight leg raise with < 10 degree lag Restrictions Weight Bearing Restrictions: No RLE Weight Bearing: Weight bearing as tolerated Mobility (including Balance)   pt just amb to and from the bathroom   Exercise  Total Joint Exercises Ankle Circles/Pumps: AROM;Both;10 reps Quad Sets: AROM;Both;10 reps Gluteal Sets: AROM;Both;10 reps Towel Squeeze: AROM;Both;10 reps Short Arc Quad: AROM;Right;10 reps Heel Slides: AAROM;Right;10 reps Hip ABduction/ADduction: AAROM;Right;10 reps Straight Leg Raises: AAROM;Right;10 reps End of Session PT - End of Session Activity Tolerance: Patient tolerated treatment well Patient left: in bed General Behavior During Session: [facility] for tasks performed Cognition:  [facility] for tasks performed Note: pt scheduled for back surgery Friday Armando Reichert 04/18/2011, 12:51 PM

## 2011-04-18 NOTE — Anesthesia Preprocedure Evaluation (Addendum)
Anesthesia Evaluation  Patient identified by MRN, date of birth, ID band Patient awake    Reviewed: Allergy & Precautions, H&P , NPO status , Patient's Chart, lab work & pertinent test results  History of Anesthesia Complications Negative for: history of anesthetic complications  Airway Mallampati: II TM Distance: >3 FB Neck ROM: Full    Dental No notable dental hx. (+) Teeth Intact and Dental Advisory Given,    Pulmonary neg pulmonary ROS, COPD Mild COPD.  No Rx clear to auscultation  Pulmonary exam normal       Cardiovascular hypertension, Pt. on medications neg cardio ROS     Neuro/Psych Negative Neurological ROS  Negative Psych ROS   GI/Hepatic negative GI ROS, Neg liver ROS,   Endo/Other  Negative Endocrine ROSDiabetes mellitus-, Type 2Borderline dm.  No meds  Renal/GU negative Renal ROS  Genitourinary negative   Musculoskeletal negative musculoskeletal ROS (+)   Abdominal Normal abdominal exam  (+)   Peds negative pediatric ROS (+)  Hematology negative hematology ROS (+) Anemia Hgb 9.9   Anesthesia Other Findings   Reproductive/Obstetrics negative OB ROS                        Anesthesia Physical Anesthesia Plan  ASA: II  Anesthesia Plan: General   Post-op Pain Management:    Induction: Intravenous  Airway Management Planned: Oral ETT  Additional Equipment:   Intra-op Plan:   Post-operative Plan:   Informed Consent: I have reviewed the patients History and Physical, chart, labs and discussed the procedure including the risks, benefits and alternatives for the proposed anesthesia with the patient or authorized representative who has indicated his/her understanding and acceptance.   Dental advisory given  Plan Discussed with:   Anesthesia Plan Comments:         Anesthesia Quick Evaluation

## 2011-04-18 NOTE — Progress Notes (Signed)
ANTIBIOTIC CONSULT NOTE - FOLLOW UP  Pharmacy Consult for Vancomycin Indication: s/p right knee arthrotomy, irrigation debridement, and  polyethylene liner exchange   No Known Allergies  Patient Measurements: Height: 5' 5.35" (166 cm) Weight: 210 lb 1.6 oz (95.3 kg) IBW/kg (Calculated) : 57.81   Vital Signs: Temp: 98.7 F (37.1 C) (11/08 1435) Temp src: Oral (11/08 1435) BP: 134/76 mmHg (11/08 1435) Pulse Rate: 83  (11/08 1435) Intake/Output from previous day: 11/07 0701 - 11/08 0700 In: 1857.3 [P.O.:1080; I.V.:577.3; IV Piggyback:200] Out: 1 [Stool:1] Intake/Output from this shift:    Labs:  Basename 04/18/11 2115 04/18/11 0710 04/17/11 0419 04/16/11 0355  WBC -- 8.8 8.7 8.1  HGB -- 9.9* 9.6* 9.3*  PLT -- 389 353 326  LABCREA -- -- -- --  CREATININE 0.67 -- -- --   Estimated Creatinine Clearance: 77.4 ml/min (by C-G formula based on Cr of 0.67).  Basename 04/18/11 2115  VANCOTROUGH 16.3  VANCOPEAK --  VANCORANDOM --  GENTTROUGH --  GENTPEAK --  GENTRANDOM --  TOBRATROUGH --  TOBRAPEAK --  TOBRARND --  AMIKACINPEAK --  AMIKACINTROU --  AMIKACIN --     Microbiology: Recent Results (from the past 720 hour(s))  GRAM STAIN     Status: Normal   Collection Time   04/07/11  9:12 PM      Component Value Range Status Comment   Specimen Description NEEDLE ASPIRATE   Final    Special Requests NONE   Final    Gram Stain     Final    Value: ABUNDANT GRAM POSITIVE COCCI     ABUNDANT WBC PRESENT, PREDOMINANTLY PMN     READ BACK AND VERIFIED WITH ELIZABETH FANT RN AT 2235 04/07/2011 BY R LINEBERRY   Report Status 04/07/2011 FINAL   Final   PATHOLOGIST SMEAR REVIEW     Status: Normal   Collection Time   04/07/11  9:13 PM      Component Value Range Status Comment   Tech Review Reviewed By Havery Moros, M.D.   Final   BODY FLUID CULTURE     Status: Normal   Collection Time   04/07/11  9:14 PM      Component Value Range Status Comment   Specimen Description  SYNOVIAL RIGHT KNEE ASPIRATE   Final    Special Requests NONE   Final    Gram Stain     Final    Value: ABUNDANT WBC PRESENT, PREDOMINANTLY PMN     ABUNDANT GRAM POSITIVE COCCI     Gram Stain Report Called to,Read Back By and Verified With: Gram Stain Report Called to,Read Back By and Verified With: Tennis Ship RN AT 2235 04/07/2011 BY Nelva Nay Performed at [facility]   Culture     Final    Value: MODERATE STAPHYLOCOCCUS AUREUS     Note: RIFAMPIN AND GENTAMICIN SHOULD NOT BE USED AS SINGLE DRUGS FOR TREATMENT OF STAPH INFECTIONS.   Report Status 04/10/2011 FINAL   Final    Organism ID, Bacteria STAPHYLOCOCCUS AUREUS   Final   SURGICAL PCR SCREEN     Status: Abnormal   Collection Time   04/10/11 12:45 PM      Component Value Range Status Comment   MRSA, PCR NEGATIVE  NEGATIVE  Final    Staphylococcus aureus POSITIVE (*) NEGATIVE  Final   BODY FLUID CULTURE     Status: Normal   Collection Time   04/10/11  3:59 PM      Component  Value Range Status Comment   Specimen Description FLUID RIGHT KNEE   Final    Special Requests ANCEF IMMUNE:NORM   Final    Gram Stain     Final    Value: ABUNDANT WBC PRESENT, PREDOMINANTLY PMN     RARE GRAM POSITIVE COCCI IN PAIRS   Culture     Final    Value: MODERATE STAPHYLOCOCCUS AUREUS     Note: RIFAMPIN AND GENTAMICIN SHOULD NOT BE USED AS SINGLE DRUGS FOR TREATMENT OF STAPH INFECTIONS.   Report Status 04/13/2011 FINAL   Final    Organism ID, Bacteria STAPHYLOCOCCUS AUREUS   Final   ANAEROBIC CULTURE     Status: Normal   Collection Time   04/10/11  3:59 PM      Component Value Range Status Comment   Specimen Description FLUID RIGHT KNEE   Final    Special Requests ANCEF IMMUNE:NORM   Final    Gram Stain     Final    Value: MODERATE WBC PRESENT, PREDOMINANTLY PMN     NO ORGANISMS SEEN   Culture NO ANAEROBES ISOLATED   Final    Report Status 04/15/2011 FINAL   Final     Anti-infectives     Start     Dose/Rate Route Frequency  Ordered Stop   04/17/11 1510   ceFAZolin (ANCEF) IVPB 2 g/50 mL premix        2 g 100 mL/hr over 30 Minutes Intravenous 60 min pre-op 04/17/11 1510     04/13/11 2200   vancomycin (VANCOCIN) IVPB 1000 mg/200 mL premix        1,000 mg 200 mL/hr over 60 Minutes Intravenous Every 12 hours 04/13/11 1025     04/13/11 1015   rifampin (RIFADIN) capsule 300 mg        300 mg Oral Daily 04/13/11 1014            Assessment: Day #6 Vancomycin 1g IV q12h and Rifampin 300mg  po daily. MD initially ordered and requested pharmacy to adjust today. Pt is s/p right knee arthrotomy, irrigation debridement, and  polyethylene liner exchange. Knee aspirate  = MSSA.   She is scheduled for back surgery on Friday,  April 19, 2011.  Vancomycin trough tonight is therapeutic.  Goal of Therapy:  Vancomycin trough level 15-20 mcg/ml  Plan:  Continue Vancomycin 1g IV q12h. Continue to monitor and adjust as appropriate. Length of therapy per MD.   Gwen Her 04/18/2011,10:41 PM

## 2011-04-19 ENCOUNTER — Inpatient Hospital Stay (HOSPITAL_COMMUNITY): Payer: Medicare Other

## 2011-04-19 ENCOUNTER — Encounter (HOSPITAL_COMMUNITY)
Admission: AD | Disposition: A | Discharge status: Skilled Nursing Facility | Payer: Self-pay | Source: Ambulatory Visit | Attending: Orthopedic Surgery

## 2011-04-19 ENCOUNTER — Encounter (HOSPITAL_COMMUNITY): Payer: Self-pay | Admitting: Anesthesiology

## 2011-04-19 ENCOUNTER — Inpatient Hospital Stay (HOSPITAL_COMMUNITY): Payer: Medicare Other | Admitting: Anesthesiology

## 2011-04-19 ENCOUNTER — Encounter (HOSPITAL_COMMUNITY): Payer: Self-pay | Admitting: Orthopedic Surgery

## 2011-04-19 HISTORY — PX: LUMBAR LAMINECTOMY/DECOMPRESSION MICRODISCECTOMY: SHX5026

## 2011-04-19 LAB — CBC
Hemoglobin: 9.5 g/dL — ABNORMAL LOW (ref 12.0–15.0)
RBC: 3.47 MIL/uL — ABNORMAL LOW (ref 3.87–5.11)

## 2011-04-19 LAB — HEMOGLOBIN AND HEMATOCRIT, BLOOD
HCT: 28.3 % — ABNORMAL LOW (ref 36.0–46.0)
Hemoglobin: 9.2 g/dL — ABNORMAL LOW (ref 12.0–15.0)

## 2011-04-19 LAB — PROTIME-INR
INR: 1.44 (ref 0.00–1.49)
Prothrombin Time: 17.8 seconds — ABNORMAL HIGH (ref 11.6–15.2)

## 2011-04-19 SURGERY — LUMBAR LAMINECTOMY/DECOMPRESSION MICRODISCECTOMY
Anesthesia: General | Site: Back | Wound class: Clean

## 2011-04-19 MED ORDER — NEOSTIGMINE METHYLSULFATE 1 MG/ML IJ SOLN
INTRAMUSCULAR | Status: DC | PRN
  Administered 2011-04-19: 5 mg via INTRAVENOUS

## 2011-04-19 MED ORDER — ONE-DAILY MULTI VITAMINS PO TABS: 1.0000 | ORAL_TABLET | Freq: Every day | ORAL | Status: DC

## 2011-04-19 MED ORDER — POLYSACCHARIDE IRON 150 MG PO CAPS
150.0000 mg | ORAL_CAPSULE | Freq: Every day | ORAL | Status: DC
  Administered 2011-04-19 – 2011-04-24 (×6): 150 mg via ORAL
  Filled 2011-04-19 (×6): qty 1

## 2011-04-19 MED ORDER — HEMOSTATIC AGENTS (NO CHARGE) OPTIME
TOPICAL | Status: DC | PRN
  Administered 2011-04-19: 1 via TOPICAL

## 2011-04-19 MED ORDER — LIDOCAINE HCL (CARDIAC) 20 MG/ML IV SOLN
INTRAVENOUS | Status: DC | PRN
  Administered 2011-04-19: 80 mg via INTRAVENOUS

## 2011-04-19 MED ORDER — THROMBIN 5000 UNITS EX KIT
PACK | CUTANEOUS | Status: DC | PRN
  Administered 2011-04-19: 10000 [IU] via TOPICAL

## 2011-04-19 MED ORDER — MIDAZOLAM HCL 5 MG/5ML IJ SOLN
INTRAMUSCULAR | Status: DC | PRN
  Administered 2011-04-19: 2 mg via INTRAVENOUS

## 2011-04-19 MED ORDER — FENTANYL CITRATE 0.05 MG/ML IJ SOLN
INTRAMUSCULAR | Status: DC | PRN
  Administered 2011-04-19 (×7): 50 ug via INTRAVENOUS

## 2011-04-19 MED ORDER — ROCURONIUM BROMIDE 100 MG/10ML IV SOLN
INTRAVENOUS | Status: DC | PRN
Start: 2011-04-19 — End: 2011-04-19
  Administered 2011-04-19: 5 mg via INTRAVENOUS
  Administered 2011-04-19: 10 mg via INTRAVENOUS
  Administered 2011-04-19: 40 mg via INTRAVENOUS
  Administered 2011-04-19: 5 mg via INTRAVENOUS

## 2011-04-19 MED ORDER — VITAMIN K1 10 MG/ML IJ SOLN
5.0000 mg | INTRAVENOUS | Status: DC | PRN
  Administered 2011-04-19: 5 mg via INTRAVENOUS

## 2011-04-19 MED ORDER — LACTATED RINGERS IV SOLN
INTRAVENOUS | Status: DC
  Administered 2011-04-20: 1000 mL via INTRAVENOUS
  Administered 2011-04-20 – 2011-04-23 (×6): via INTRAVENOUS

## 2011-04-19 MED ORDER — VITAMIN E 180 MG (400 UNIT) PO CAPS: 4000.0000 [IU] | ORAL_CAPSULE | Freq: Every day | ORAL | Status: DC

## 2011-04-19 MED ORDER — VITAMIN K1 10 MG/ML IJ SOLN
5.0000 mg | Freq: Once | INTRAVENOUS | Status: DC
  Filled 2011-04-19: qty 0.5

## 2011-04-19 MED ORDER — HYDROMORPHONE 0.3 MG/ML IV SOLN
INTRAVENOUS | Status: DC
  Administered 2011-04-19 – 2011-04-20 (×2): 7.5 mg via INTRAVENOUS
  Administered 2011-04-20: 3.19 mg via INTRAVENOUS
  Administered 2011-04-20: 0.4 mg via INTRAVENOUS
  Administered 2011-04-21: 0.2 mg via INTRAVENOUS
  Administered 2011-04-21: 0.999 mg via INTRAVENOUS
  Administered 2011-04-21: 0.599 mg via INTRAVENOUS
  Administered 2011-04-21: 0.4 mg via INTRAVENOUS
  Filled 2011-04-19 (×3): qty 25

## 2011-04-19 MED ORDER — VITAMIN E 180 MG (400 UNIT) PO CAPS
400.0000 [IU] | ORAL_CAPSULE | Freq: Every day | ORAL | Status: DC
  Administered 2011-04-19 – 2011-04-22 (×4): 400 [IU] via ORAL
  Filled 2011-04-19 (×6): qty 1

## 2011-04-19 MED ORDER — VITAMIN K1 10 MG/ML IJ SOLN
5.0000 mg | INTRAVENOUS | Status: DC
  Filled 2011-04-19: qty 0.5

## 2011-04-19 MED ORDER — SODIUM CHLORIDE 0.9 % IR SOLN
Status: DC | PRN
  Administered 2011-04-19: 16:00:00

## 2011-04-19 MED ORDER — NALOXONE HCL 0.4 MG/ML IJ SOLN: 0.4000 mg | INTRAMUSCULAR | Status: DC | PRN

## 2011-04-19 MED ORDER — OMEGA-3-ACID ETHYL ESTERS 1 G PO CAPS
1.0000 g | ORAL_CAPSULE | Freq: Every day | ORAL | Status: DC
  Administered 2011-04-19 – 2011-04-22 (×4): 1 g via ORAL
  Filled 2011-04-19 (×5): qty 1

## 2011-04-19 MED ORDER — HYDROMORPHONE HCL PF 1 MG/ML IJ SOLN
0.2500 mg | INTRAMUSCULAR | Status: DC | PRN
  Administered 2011-04-19 (×3): 0.5 mg via INTRAVENOUS

## 2011-04-19 MED ORDER — PROPOFOL 10 MG/ML IV EMUL
INTRAVENOUS | Status: DC | PRN
  Administered 2011-04-19: 170 mg via INTRAVENOUS

## 2011-04-19 MED ORDER — LACTATED RINGERS IV SOLN
INTRAVENOUS | Status: DC | PRN
  Administered 2011-04-19: 14:00:00 via INTRAVENOUS

## 2011-04-19 MED ORDER — THERA M PLUS PO TABS
1.0000 | ORAL_TABLET | Freq: Every day | ORAL | Status: DC
  Administered 2011-04-19 – 2011-04-21 (×3): 1 via ORAL
  Administered 2011-04-22: 10:00:00 via ORAL
  Filled 2011-04-19 (×5): qty 1

## 2011-04-19 MED ORDER — BACITRACIN-NEOMYCIN-POLYMYXIN 400-5-5000 EX OINT
TOPICAL_OINTMENT | CUTANEOUS | Status: DC | PRN
  Administered 2011-04-19: 1 via TOPICAL

## 2011-04-19 MED ORDER — SODIUM CHLORIDE 0.9 % IJ SOLN: 9.0000 mL | INTRAMUSCULAR | Status: DC | PRN

## 2011-04-19 MED ORDER — QUINAPRIL-HYDROCHLOROTHIAZIDE 20-12.5 MG PO TABS: 1.0000 | ORAL_TABLET | Freq: Every day | ORAL | Status: DC

## 2011-04-19 MED ORDER — MEPERIDINE HCL 25 MG/ML IJ SOLN: 6.2500 mg | INTRAMUSCULAR | Status: DC | PRN

## 2011-04-19 MED ORDER — GLYCOPYRROLATE 0.2 MG/ML IJ SOLN
INTRAMUSCULAR | Status: DC | PRN
  Administered 2011-04-19: .8 mg via INTRAVENOUS

## 2011-04-19 MED ORDER — ONDANSETRON HCL 4 MG/2ML IJ SOLN
INTRAMUSCULAR | Status: DC | PRN
  Administered 2011-04-19: 4 mg via INTRAVENOUS

## 2011-04-19 MED ORDER — PROMETHAZINE HCL 25 MG/ML IJ SOLN: 6.2500 mg | INTRAMUSCULAR | Status: DC | PRN

## 2011-04-19 MED ORDER — VITAMIN K1 10 MG/ML IJ SOLN
5.0000 mg | Freq: Once | INTRAVENOUS | Status: AC
  Administered 2011-04-19: 5 mg via INTRAVENOUS
  Filled 2011-04-19: qty 0.5

## 2011-04-19 SURGICAL SUPPLY — 43 items
BAG ZIPLOCK 12X15 (MISCELLANEOUS) ×2 IMPLANT
BENZOIN TINCTURE PRP APPL 2/3 (GAUZE/BANDAGES/DRESSINGS) ×2 IMPLANT
BUR W/OAL 277010062S2 (BURR) IMPLANT
CATH ROBINSON RED A/P 14FR (CATHETERS) ×2 IMPLANT
CLEANER TIP ELECTROSURG 2X2 (MISCELLANEOUS) ×2 IMPLANT
CLOTH BEACON ORANGE TIMEOUT ST (SAFETY) ×2 IMPLANT
CONT SPECI 4OZ STER CLIK (MISCELLANEOUS) IMPLANT
DRAIN PENROSE 18X1/4 LTX STRL (WOUND CARE) IMPLANT
DRAPE MICROSCOPE LEICA (MISCELLANEOUS) ×2 IMPLANT
DRAPE POUCH INSTRU U-SHP 10X18 (DRAPES) ×2 IMPLANT
DRAPE SURG 17X11 SM STRL (DRAPES) ×2 IMPLANT
DRSG ADAPTIC 3X8 NADH LF (GAUZE/BANDAGES/DRESSINGS) ×2 IMPLANT
DRSG PAD ABDOMINAL 8X10 ST (GAUZE/BANDAGES/DRESSINGS) ×12 IMPLANT
DURAPREP 26ML APPLICATOR (WOUND CARE) ×2 IMPLANT
ELECT REM PT RETURN 9FT ADLT (ELECTROSURGICAL) ×2
ELECTRODE REM PT RTRN 9FT ADLT (ELECTROSURGICAL) ×1 IMPLANT
FLOSEAL 10ML (HEMOSTASIS) ×2 IMPLANT
GAUZE SPONGE 4X4 12PLY STRL LF (GAUZE/BANDAGES/DRESSINGS) ×2 IMPLANT
GLOVE BIOGEL PI IND STRL 8.5 (GLOVE) ×2 IMPLANT
GLOVE BIOGEL PI INDICATOR 8.5 (GLOVE) ×2
GLOVE ECLIPSE 8.0 STRL XLNG CF (GLOVE) ×4 IMPLANT
GOWN PREVENTION PLUS LG XLONG (DISPOSABLE) ×2 IMPLANT
GOWN STRL REIN XL XLG (GOWN DISPOSABLE) ×4 IMPLANT
KIT BASIN OR (CUSTOM PROCEDURE TRAY) ×2 IMPLANT
MANIFOLD NEPTUNE II (INSTRUMENTS) ×2 IMPLANT
NEEDLE SPNL 18GX3.5 QUINCKE PK (NEEDLE) ×6 IMPLANT
NS IRRIG 1000ML POUR BTL (IV SOLUTION) IMPLANT
PATTIES SURGICAL .5 X.5 (GAUZE/BANDAGES/DRESSINGS) IMPLANT
PATTIES SURGICAL .75X.75 (GAUZE/BANDAGES/DRESSINGS) IMPLANT
PATTIES SURGICAL 1X1 (DISPOSABLE) IMPLANT
PIN SAFETY NICK PLATE  2 MED (MISCELLANEOUS)
PIN SAFETY NICK PLATE 2 MED (MISCELLANEOUS) IMPLANT
POSITIONER SURGICAL ARM (MISCELLANEOUS) ×2 IMPLANT
SPONGE LAP 4X18 X RAY DECT (DISPOSABLE) ×16 IMPLANT
SPONGE SURGIFOAM ABS GEL 100 (HEMOSTASIS) ×2 IMPLANT
STAPLER VISISTAT 35W (STAPLE) IMPLANT
SUT VIC AB 0 CT1 27 (SUTURE) ×1
SUT VIC AB 0 CT1 27XBRD ANTBC (SUTURE) ×1 IMPLANT
SUT VIC AB 1 CT1 27 (SUTURE) ×3
SUT VIC AB 1 CT1 27XBRD ANTBC (SUTURE) ×3 IMPLANT
TOWEL OR 17X26 10 PK STRL BLUE (TOWEL DISPOSABLE) ×4 IMPLANT
TRAY LAMINECTOMY (CUSTOM PROCEDURE TRAY) ×2 IMPLANT
WATER STERILE IRR 1500ML POUR (IV SOLUTION) IMPLANT

## 2011-04-19 NOTE — Transfer of Care (Signed)
Immediate Anesthesia Transfer of Care Note  Patient: ABBEE [name]  Procedure(s) Performed:  LUMBAR LAMINECTOMY/DECOMPRESSION MICRODISCECTOMY - Central decompression Lumbar two to lumbar three and Three to Lumbar Four    (xray)  Patient Location: PACU  Anesthesia Type: General  Level of Consciousness: awake, alert , oriented and patient cooperative  Airway & Oxygen Therapy: Patient Spontanous Breathing and Patient connected to face mask oxygen  Post-op Assessment: Report given to PACU RN, Post -op Vital signs reviewed and stable and Patient moving all extremities X 4  Post vital signs: Reviewed and stable  Complications: No apparent anesthesia complications

## 2011-04-19 NOTE — Op Note (Signed)
[name], Vanessa NO.:  [id number]  MEDICAL RECORD NO.:  [id number]  LOCATION:  1604                         FACILITY:  [facility]  PHYSICIAN:  Georges Lynch. Srija Southard, M.D.DATE OF BIRTH:  September 06, 1940  DATE OF PROCEDURE:  04/19/2011 DATE OF DISCHARGE:                              OPERATIVE REPORT   PREOPERATIVE DIAGNOSIS:  Severe spinal stenosis with a complete block at L3-4, partial block at L2-3.  POSTOPERATIVE DIAGNOSIS:  Severe spinal stenosis with a complete block at L3-4, partial block at L2-3.  SURGEON:  Georges Lynch. Darrelyn Hillock, M.D.  ASSISTANT:  Marlowe Kays, M.D.  OPERATION: 1. Complete decompressive lumbar laminectomy at L2-L3. 2. Complete decompressive lumbar laminectomy at L3-L4 for a severe     spinal stenosis. 3. We also did partial facetectomies and foraminotomies at both levels     bilaterally.  PROCEDURE:  Under general anesthesia, the patient on the Wilson frame, routine orthopedic prepping and draping of the lower back was carried out.  The appropriate time-out was carried out in the operating room prior to making incision and prior to inserting the needles.  Also, I marked the appropriate right side of the back in the holding area regardless of the fact we went center because all her symptoms were on the right.  The procedure under general anesthesia, routine orthopedic prepping and draping as I mentioned was carried out in the lower back with the patient on the Wilson frame.  Two needles were placed in the back for localization purposes.  X-ray was taken to verify position. Incision then was made over the L2-3, 3-4 region.  Bleeders identified and cauterized.  Note, we had some gradual bleeding within the wound. She was on previous Coumadin.  She was given vitamin K preop plus vitamin K in the operating room.  Her preop INR was 1.4, which was normal.  The muscle then was stripped from the lamina bilaterally.  Two Kocher clamps were placed in  place to isolate all spinous processes. Once we knew the exact level, we then carried out our decompressive lumbar laminectomy in the usual fashion.  Dr. Simonne Come did a portion of the excision of the spinous processes from his side and stripped the muscle from his sides from the lamina.  We then went down deep and as I mentioned we went down deep to the lamina.  The bone was extremely hard. She was extremely constricted with a very complex case.  Dr. Simonne Come assisted cauterization because we had rather significant soft tissue bleeding there, no major, but there was a significant a very time consuming.  Once the bleeding was under total control, we brought the microscope in and I then carried out our decompressive lumbar laminectomy, as I mentioned, if the canal was extremely tight, the ligamentum flavum was about triple the normal thickness.  There was evidence of a previous epidural steroid injection.  We did partial facetectomy on the right at L3-4 level.  We worked our way proximally until we had a very well decompression done.  We were able to easily pass a hockey-stick out now proximally and we were free.  We then carried out the dissection distally until we were  free distally as well. Note, we did this to make sure we had adequate decompression proximally and distally and went out laterally did foraminotomies we left the ligamentum flavum on initially to protect the dura while we did our dissection.  Following that, we did use the microscope as I mentioned, Dr. Simonne Come did the hemilaminectomy of his side over towards my side and I did the opposite towards his side.  We then gently removed the ligamentum flavum, which was a very careful dissection done, we protected the dura at all times.  No dural leaks occurred.  After we thoroughly irrigated out the area, we passed the hockey-stick out the foramina, for the nerve roots L3-4  and L2-3 and distally and proximally.  We now were  very happy with the decompression.  We thoroughly irrigated out the area, I injected 10 cc of FloSeal into the wound site then loosely applied some thrombin-soaked Gelfoam.  Note, we were careful not to compress the dura with the Gelfoam.  I then closed the wound layers usual fashion, but left the small distal deep and proximal deep portions of the wound open for drainage purposes, so we did not require a drain.  Thoroughly closed the subcutaneous, skin was closed with metal staples.  A sterile Neosporin dressing was applied. Postop, she was catheterized for in and out cath in the operating room. She did had vancomycin preop.          ______________________________ Georges Lynch. Darrelyn Hillock, M.D.     RAG/MEDQ  D:  04/19/2011  T:  04/19/2011  Job:  [id number]

## 2011-04-19 NOTE — Anesthesia Postprocedure Evaluation (Signed)
  Anesthesia Post-op Note  Patient: [name]  Procedure(s) Performed:  LUMBAR LAMINECTOMY/DECOMPRESSION MICRODISCECTOMY - Central decompression Lumbar two to lumbar three and Three to Lumbar Four    (xray)  Patient Location: PACU  Anesthesia Type: General  Level of Consciousness: awake and alert   Airway and Oxygen Therapy: Patient Spontanous Breathing  Post-op Pain: mild  Post-op Assessment: Post-op Vital signs reviewed, Patient's Cardiovascular Status Stable, Respiratory Function Stable, Patent Airway and No signs of Nausea or vomiting  Post-op Vital Signs: stable  Complications: No apparent anesthesia complications

## 2011-04-19 NOTE — H&P (Signed)
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 After consideration of risks, benefits and other options for treatment, the patient has consented to  Procedure(s): LUMBAR LAMINECTOMY/DECOMPRESSION MICRODISCECTOMY as a surgical intervention .  The patients' history has been reviewed, patient examined, no change in status, stable for surgery.  I have reviewed the patients' chart and labs.  Questions were answered to the patient's satisfaction.     Jacki Cones  MD

## 2011-04-19 NOTE — Preoperative (Signed)
Beta Blockers   Reason not to administer Beta Blockers:Hold beta blocker due to other, due to concerns of hypotension during suirgery

## 2011-04-19 NOTE — Progress Notes (Signed)
[name]  MRN: [id number] DOB/Age: Dec 29, 1940 70 y.o. Physician: Georges Lynch. [name] Procedure: Procedure(s) (LRB): LUMBAR LAMINECTOMY/DECOMPRESSION MICRODISCECTOMY (N/A)   Ready for Surgery today  Vital Signs Temp:  [98.1 F (36.7 C)-98.7 F (37.1 C)] 98.3 F (36.8 C) (11/09 0615) Pulse Rate:  [80-89] 80  (11/09 0615) Resp:  [16-18] 16  (11/09 0615) BP: (134-150)/(76-95) 150/83 mmHg (11/09 0615) SpO2:  [96 %-97 %] 96 % (11/09 0615)  Lab. Results:  Basename 04/19/11 0530 04/18/11 0710  WBC 8.4 8.8  HGB 9.5* 9.9*  HCT 29.7* 30.0*  PLT 386 389   BMET  Basename 04/18/11 2115  NA --  K --  CL --  CO2 --  GLUCOSE --  BUN --  CREATININE 0.67  CALCIUM --   CBG:  INR  Date Value Range Status  04/19/2011 1.44  0.00-1.49 (no units) Final    Exam Extremities:Calf tenderness:none Pulses: Intact Neurologic:Dorsiflexion Intact Incision/Wound: Normal     Plan Surgery today Return to clinic for follow-up:  Pt to be discharged   [name] A 04/19/2011, 7:17 AM

## 2011-04-19 NOTE — Plan of Care (Signed)
Problem: Consults Goal: Diagnosis - Spinal Surgery Lumbar Laminectomy (Complex) L3-L4

## 2011-04-19 NOTE — Brief Op Note (Signed)
04/10/2011 - 04/19/2011  5:48 PM  PATIENT:  [name]  70 y.o. 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  POST-OPERATIVE DIAGNOSIS:  spinal stenosos lumbar three to lumbar four  PROCEDURE:  Procedure(s): LUMBAR LAMINECTOMY/DECOMPRESSION MICRODISCECTOMY  SURGEON:  Surgeon(s): Windy Fast A Remell Giaimo James P Aplington  PHYSICIAN ASSISTANT:   ASSISTANTS: Marlowe Kays MD   ANESTHESIA:   general  EBL:  Total I/O In: 1750 [I.V.:1450; IV Piggyback:300] Out: 300 [Urine:100; Blood:200]  BLOOD ADMINISTERED:none  DRAINS: none   LOCAL MEDICATIONS USED:  NONE  SPECIMEN:  No Specimen  DISPOSITION OF SPECIMEN:  N/A  COUNTS:  YES  TOURNIQUET:  * No tourniquets in log *  DICTATION: .Other Dictation: Dictation Number [id number]  PLAN OF CARE: Admit to inpatient   PATIENT DISPOSITION:  PACU - hemodynamically stable.   Delay start of Pharmacological VTE agent (>24hrs) due to surgical blood loss or risk of bleeding:  NOT APPLICABLE

## 2011-04-19 NOTE — Progress Notes (Signed)
Physical Therapy Treatment Patient Details Name: NAHIARA KRETZSCHMAR MRN: [id number] DOB: 23-Mar-1941 Today's Date: 04/19/2011 1020-1035 e PT Assessment/Plan  PT - Assessment/Plan Comments on Treatment Session: pt tolerates ther ex. for  back surgery today so no mobility performed PT Plan: Discharge plan remains appropriate;Frequency remains appropriate (PT willneed to reevaluate after back surgery) PT Goals  Acute Rehab PT Goals PT Goal Formulation:  (will update goals after back surgery) PT Goal: Supine/Side to Sit - Progress: Progressing toward goal PT Transfer Goal: Sit to Stand/Stand to Sit - Progress: Progressing toward goal PT Goal: Ambulate - Progress: Progressing toward goal PT Goal: Up/Down Stairs - Progress: Progressing toward goal  PT Treatment Precautions/Restrictions  Precautions Precautions: Knee Precaution Booklet Issued: No Required Braces or Orthoses: Yes Knee Immobilizer: On at all times;On except when in CPM;Discontinue once straight leg raise with < 10 degree lag Restrictions Weight Bearing Restrictions: Yes RLE Weight Bearing: Weight bearing as tolerated Mobility (including Balance) Bed Mobility Bed Mobility: No (pt is scheduled for back surgery today)    Exercise  Total Joint Exercises Quad Sets: AROM;Right;20 reps Gluteal Sets: Supine Towel Squeeze: Supine Short Arc Quad: AROM;Right;Other reps (comment);Supine (20) Heel Slides: AAROM;Right;20 reps;Supine Hip ABduction/ADduction: AROM;Right;20 reps;Supine Straight Leg Raises: AAROM;Right;20 reps;Supine End of Session PT - End of Session Activity Tolerance: Patient tolerated treatment well Patient left: in bed  Rada Hay 04/19/2011, 12:30 PM

## 2011-04-20 LAB — PROTIME-INR: Prothrombin Time: 17.1 seconds — ABNORMAL HIGH (ref 11.6–15.2)

## 2011-04-20 LAB — GLUCOSE, CAPILLARY: Glucose-Capillary: 150 mg/dL — ABNORMAL HIGH (ref 70–99)

## 2011-04-20 NOTE — Progress Notes (Signed)
[name] 70 y.o. 04/10/2011   Lab. Results:  Basename 04/19/11 2215 04/19/11 0530 04/18/11 0710  WBC -- 8.4 8.8  HGB 9.2* 9.5* --  HCT 28.3* 29.7* --  PLT -- 386 389   BMET  Basename 04/18/11 2115  NA --  K --  CL --  CO2 --  GLUCOSE --  BUN --  CREATININE 0.67  CALCIUM --    INR  Date Value Range Status  04/19/2011 1.40  0.00-1.49 (no units) Final   VITALS Filed Vitals:   04/20/11 0430  BP: 166/81  Pulse: 91  Temp: 99.5 F (37.5 C)  Resp: 18     I have reviewed her lab and her vitals.  Wound Check: normal  Burlene Montecalvo A 04/20/2011

## 2011-04-21 LAB — TYPE AND SCREEN
ABO/RH(D): A NEG
Unit division: 0
Unit division: 0

## 2011-04-21 LAB — PROTIME-INR: INR: 1.41 (ref 0.00–1.49)

## 2011-04-21 LAB — HEMOGLOBIN AND HEMATOCRIT, BLOOD
HCT: 23.2 % — ABNORMAL LOW (ref 36.0–46.0)
Hemoglobin: 7.7 g/dL — ABNORMAL LOW (ref 12.0–15.0)

## 2011-04-21 NOTE — Progress Notes (Signed)
Physical Therapy Treatment Patient Details Name: [name] MRN: [id number] DOB: November 09, 1940 Today's Date: 04/21/2011 1250-1310 ta PT Assessment/Plan  PT - Assessment/Plan Comments on Treatment Session: Pt had much mor difficulty with mobility after having been up PT Plan: Discharge plan remains appropriate;Frequency remains appropriate PT Frequency: 7X/week Follow Up Recommendations: Inpatient Rehab Equipment Recommended: Defer to next venue PT Goals     PT Treatment Precautions/Restrictions  Precautions Precautions: Knee Precaution Booklet Issued: No Required Braces or Orthoses: Yes Knee Immobilizer: On when out of bed or walking Restrictions Weight Bearing Restrictions: No RLE Weight Bearing: Weight bearing as tolerated Mobility (including Balance) Bed Mobility Bed Mobility: Yes Rolling Right: Not tested (comment) Rolling Left: Not tested (comment) Right Sidelying to Sit: Not tested (comment) Supine to Sit: 2: Max assist;HOB elevated (Comment degrees);With rails (50 degrees) Sitting - Scoot to Edge of Bed: 3: Mod assist Sit to Supine - Right: 1: +2 Total assist;Patient percentage (comment) (pt=0) Sit to Supine - Right Details (indicate cue type and reason): Pt too lethargic to assist Transfers Transfers: Yes Sit to Stand: 1: +2 Total assist;Other (comment);From bed;From chair/3-in-1;With upper extremity assist (pt. 50%) Sit to Stand Details (indicate cue type and reason): pt very weak and had difficulty standing Stand to Sit: 3: Mod assist;With upper extremity assist;To chair/3-in-1 Stand to Sit Details: pt unable to straighten knees and back Stand Pivot Transfers:  (not able) Stand Pivot Transfer Details (indicate cue type and reason): Bed had to be pulled up due to pt unable to step back Ambulation/Gait Assistive device: Rolling walker  Posture/Postural Control Postural Limitations: tends to lean forward on RW Exercise    End of Session PT - End of  Session Activity Tolerance: Patient limited by fatigue (pt also very drowsy) Patient left: in bed;with call bell in reach;with family/visitor present Nurse Communication: Mobility status for transfers General Behavior During Session: [facility] for tasks performed Cognition: [facility] for tasks performed  Rada Hay 04/21/2011, 4:17 PM [phone]

## 2011-04-21 NOTE — Progress Notes (Signed)
Pt reevaluation after back surgery

## 2011-04-21 NOTE — Plan of Care (Signed)
Problem: Phase II Progression Outcomes Goal: Ambulates Outcome: Progressing Bed to [employer] to chair using RW and RLE KI with OT.

## 2011-04-21 NOTE — Progress Notes (Signed)
  Aerabella Galasso Sheeley 70 y.o. 04/10/2011   Lab. Results:  Basename 04/21/11 0500 04/20/11 0732 04/19/11 0530  WBC -- -- 8.4  HGB 7.7* 8.5* --  HCT 23.2* 25.9* --  PLT -- -- 386   BMET  Basename 04/18/11 2115  NA --  K --  CL --  CO2 --  GLUCOSE --  BUN --  CREATININE 0.67  CALCIUM --    INR  Date Value Range Status  04/21/2011 1.41  0.00-1.49 (no units) Final   VITALS Filed Vitals:   04/21/11 0520  BP: 151/72  Pulse: 82  Temp: 99.2 F (37.3 C)  Resp: 20     I have reviewed her lab and her vitals.  Wound Check: normal  Navie Lamoreaux A 04/21/2011

## 2011-04-21 NOTE — Progress Notes (Signed)
ANTIBIOTIC CONSULT NOTE - FOLLOW UP  Pharmacy Consult for Vancomycin Indication: s/p right knee arthrotomy, irrigation debridement, and  polyethylene liner exchange 11/1 Post-op Lumbar laminectomy 11/9   No Known Allergies  Patient Measurements: Height: 5' 5.35" (166 cm) Weight: 210 lb 1.6 oz (95.3 kg) IBW/kg (Calculated) : 57.81   Vital Signs: Temp: 99.2 F (37.3 C) (11/11 0520) Temp src: Oral (11/11 0520) BP: 98/61 mmHg (11/11 0922) Pulse Rate: 82  (11/11 0520) Intake/Output from previous day: 11/10 0701 - 11/11 0700 In: 4295 [P.O.:1080; I.V.:3015; IV Piggyback:200] Out: 400 [Urine:400] Intake/Output from this shift: Total I/O In: -  Out: 250 [Urine:250]  Labs:  [facility] 04/21/11 0500 04/20/11 0732 04/19/11 2215 04/19/11 0530 04/18/11 2115  WBC -- -- -- 8.4 --  HGB 7.7* 8.5* 9.2* -- --  PLT -- -- -- 386 --  LABCREA -- -- -- -- --  CREATININE -- -- -- -- 0.67   Estimated Creatinine Clearance: 77.4 ml/min (by C-G formula based on Cr of 0.67).  Basename 04/18/11 2115  VANCOTROUGH 16.3  VANCOPEAK --  VANCORANDOM --  GENTTROUGH --  GENTPEAK --  GENTRANDOM --  TOBRATROUGH --  TOBRAPEAK --  TOBRARND --  AMIKACINPEAK --  AMIKACINTROU --  AMIKACIN --     Microbiology: Recent Results (from the past 720 hour(s))  GRAM STAIN     Status: Normal   Collection Time   04/07/11  9:12 PM      Component Value Range Status Comment   Specimen Description NEEDLE ASPIRATE   Final    Special Requests NONE   Final    Gram Stain     Final    Value: ABUNDANT GRAM POSITIVE COCCI     ABUNDANT WBC PRESENT, PREDOMINANTLY PMN     READ BACK AND VERIFIED WITH ELIZABETH FANT RN AT 2235 04/07/2011 BY R LINEBERRY   Report Status 04/07/2011 FINAL   Final   PATHOLOGIST SMEAR REVIEW     Status: Normal   Collection Time   04/07/11  9:13 PM      Component Value Range Status Comment   Tech Review Reviewed By Havery Moros, M.D.   Final   BODY FLUID CULTURE     Status: Normal   Collection Time   04/07/11  9:14 PM      Component Value Range Status Comment   Specimen Description SYNOVIAL RIGHT KNEE ASPIRATE   Final    Special Requests NONE   Final    Gram Stain     Final    Value: ABUNDANT WBC PRESENT, PREDOMINANTLY PMN     ABUNDANT GRAM POSITIVE COCCI     Gram Stain Report Called to,Read Back By and Verified With: Gram Stain Report Called to,Read Back By and Verified With: Tennis Ship RN AT 2235 04/07/2011 BY Nelva Nay Performed at [facility]   Culture     Final    Value: MODERATE STAPHYLOCOCCUS AUREUS     Note: RIFAMPIN AND GENTAMICIN SHOULD NOT BE USED AS SINGLE DRUGS FOR TREATMENT OF STAPH INFECTIONS.   Report Status 04/10/2011 FINAL   Final    Organism ID, Bacteria STAPHYLOCOCCUS AUREUS   Final   SURGICAL PCR SCREEN     Status: Abnormal   Collection Time   04/10/11 12:45 PM      Component Value Range Status Comment   MRSA, PCR NEGATIVE  NEGATIVE  Final    Staphylococcus aureus POSITIVE (*) NEGATIVE  Final   BODY FLUID CULTURE     Status:  Normal   Collection Time   04/10/11  3:59 PM      Component Value Range Status Comment   Specimen Description FLUID RIGHT KNEE   Final    Special Requests ANCEF IMMUNE:NORM   Final    Gram Stain     Final    Value: ABUNDANT WBC PRESENT, PREDOMINANTLY PMN     RARE GRAM POSITIVE COCCI IN PAIRS   Culture     Final    Value: MODERATE STAPHYLOCOCCUS AUREUS     Note: RIFAMPIN AND GENTAMICIN SHOULD NOT BE USED AS SINGLE DRUGS FOR TREATMENT OF STAPH INFECTIONS.   Report Status 04/13/2011 FINAL   Final    Organism ID, Bacteria STAPHYLOCOCCUS AUREUS   Final   ANAEROBIC CULTURE     Status: Normal   Collection Time   04/10/11  3:59 PM      Component Value Range Status Comment   Specimen Description FLUID RIGHT KNEE   Final    Special Requests ANCEF IMMUNE:NORM   Final    Gram Stain     Final    Value: MODERATE WBC PRESENT, PREDOMINANTLY PMN     NO ORGANISMS SEEN   Culture NO ANAEROBES ISOLATED   Final     Report Status 04/15/2011 FINAL   Final     Anti-infectives     Start     Dose/Rate Route Frequency Ordered Stop   04/19/11 1530   polymyxin B 500,000 Units, bacitracin 50,000 Units in sodium chloride 0.9 % 500 mL irrigation  Status:  Discontinued          As needed 04/19/11 1601 04/19/11 1752   04/17/11 1510   ceFAZolin (ANCEF) IVPB 2 g/50 mL premix  Status:  Discontinued        2 g 100 mL/hr over 30 Minutes Intravenous 60 min pre-op 04/17/11 1510 04/19/11 1433   04/13/11 2200   vancomycin (VANCOCIN) IVPB 1000 mg/200 mL premix        1,000 mg 200 mL/hr over 60 Minutes Intravenous Every 12 hours 04/13/11 1025     04/13/11 1015   rifampin (RIFADIN) capsule 300 mg        300 mg Oral Daily 04/13/11 1014            Assessment: Day #9 Vancomycin 1g IV q12h and Rifampin 300mg  po daily. MD initially ordered and requested pharmacy to adjust today. Pt is s/p right knee arthrotomy, irrigation debridement, and  polyethylene liner exchange 11/1 Lumbar Laminectomy 11/9 Knee aspirate Staph aureus = MSSA Vancomycin trough 11/8 therapeutic at 16.8  Coumadin held for laminectomy, INR 1.41 today, no Coumadin yet,called MD since no mention in prog note.  Goal of Therapy:  Vancomycin trough level 15-20 mcg/ml  Plan:  Continue Vancomycin 1g IV q12h Day 9 + Rifampin po. Repeat trough in 3-4 days or sooner if SCr changes.  Monitor INR.  Bee Hammerschmidt L 04/21/2011,11:53 AM

## 2011-04-21 NOTE — Progress Notes (Signed)
Occupational Therapy Evaluation Patient Details Name: [name] MRN: [id number] DOB: Dec 26, 1940 Today's Date: 04/21/2011 8:40-9:22  Problem List:  Patient Active Problem List  Diagnoses  . CARCINOMA, SQUAMOUS CELL  . DYSLIPIDEMIA  . HYPERTENSION  . DEGENERATIVE JOINT DISEASE, GENERALIZED  . OSTEOARTHRITIS  . ROTATOR CUFF SYNDROME  . DYSPNEA  . DIABETES MELLITUS, BORDERLINE    Past Medical History:  Past Medical History  Diagnosis Date  . Rotator cuff syndrome   . DJD (degenerative joint disease)     generalized  . Dyslipidemia   . Diabetes mellitus     borderline  . Squamous cell carcinoma   . Osteoarthritis   . HTN (hypertension)   . Dyspnea   . Spinal stenosis of lumbar region 04/17/2011   Past Surgical History:  Past Surgical History  Procedure Date  . Rotator cuff repair     bilaterally  . Cataract extraction     OT Assessment/Plan/Recommendation OT Assessment Clinical Impression Statement: 70 yo WF s/p R TKA and Lumbar Laminectomy. Pt. will benefit from skilled acute OT as well as potential CIR in order to increase I to PLOF level upon final D/C home with spouse.  OT Recommendation/Assessment: Patient will need skilled OT in the acute care venue OT Problem List: Decreased range of motion;Decreased activity tolerance;Impaired balance (sitting and/or standing);Decreased knowledge of use of DME or AE;Cardiopulmonary status limiting activity;Pain OT Therapy Diagnosis : Generalized weakness;Acute pain OT Plan OT Frequency: Min 2X/week OT Treatment/Interventions: Self-care/ADL training;DME and/or AE instruction;Therapeutic activities;Patient/family education;Balance training OT Recommendation Recommendations for Other Services: Rehab consult Follow Up Recommendations: Inpatient Rehab Equipment Recommended: Defer to next venue Individuals Consulted Consulted and Agree with Results and Recommendations: Patient;Family Adult nurse (pt's spouse) Family  Member Consulted: pt's spouse OT Goals Acute Rehab OT Goals OT Goal Formulation: With patient/family Time For Goal Achievement: 2 weeks ADL Goals ADL Goal: Grooming - Progress: Not addressed Pt Will Perform Lower Body Bathing: with supervision;Sit to stand from bed;with adaptive equipment;Unsupported;with cueing (comment type and amount) (min vc's) ADL Goal: Lower Body Bathing - Progress: Progressing toward goals Pt Will Perform Lower Body Dressing: with supervision;Unsupported;Sit to stand from bed;with adaptive equipment;with cueing (comment type and amount) (min vc's) ADL Goal: Lower Body Dressing - Progress: Progressing toward goals Pt Will Transfer to Toilet: with min assist;3-in-1;Ambulation ADL Goal: Toilet Transfer - Progress: Progressing toward goals Pt Will Perform Toileting - Hygiene: with min assist;Sit to stand from 3-in-1/toilet ADL Goal: Toileting - Hygiene - Progress: Progressing toward goals Additional ADL Goal #1: Pt. will perform bed mobility with Min. A in prep for ADLs ADL Goal: Additional Goal #1 - Progress: Progressing toward goals  OT Evaluation Precautions/Restrictions  Precautions Precautions: Knee Precaution Booklet Issued: No Required Braces or Orthoses: Yes Knee Immobilizer: On when out of bed or walking Restrictions Weight Bearing Restrictions: No RLE Weight Bearing: Weight bearing as tolerated Prior Functioning Home Living Lives With: Spouse Receives Help From: Family Type of Home: House Bathroom Shower/Tub: Engineer, manufacturing systems: Standard Bathroom Accessibility: Yes How Accessible: Accessible via walker Home Adaptive Equipment: Walker - rolling;Bedside commode/3-in-1 Additional Comments: Likely able to borrow tub transfer bench from relative; spouse to check on.  Prior Function Level of Independence: Independent with basic ADLs;Requires assistive device for independence (intermittent use of RW PRN ) Driving: Yes Comments: Receptive  to CIR upon OT/PT recommendation ADL ADL Eating/Feeding: Simulated;Independent Where Assessed - Eating/Feeding: Chair Grooming: Simulated;Set up Where Assessed - Grooming: Sitting, chair;Unsupported Upper Body Bathing: Simulated;Set up Where Assessed - Upper  Body Bathing: Sitting, chair;Unsupported Lower Body Bathing: Simulated;Maximal assistance Where Assessed - Lower Body Bathing: Sit to stand from chair;Unsupported Upper Body Dressing: Simulated;Set up Where Assessed - Upper Body Dressing: Sitting, chair;Unsupported Lower Body Dressing: Simulated;Maximal assistance Where Assessed - Lower Body Dressing: Sit to stand from chair Toilet Transfer: Performed;Maximal assistance (2nd person present for safety) Toilet Transfer Method: Ambulating Toilet Transfer Equipment: Bedside commode Toileting - Clothing Manipulation: +1 Total assistance Where Assessed - Glass blower/designer Manipulation: Standing Toileting - Hygiene: +1 Total assistance Where Assessed - Toileting Hygiene: Sit to stand from 3-in-1 or toilet Tub/Shower Transfer: Not assessed Tub/Shower Transfer Method: Not assessed Equipment Used: Rolling walker Vision/Perception  Vision - History Baseline Vision: Bifocals Patient Visual Report: No change from baseline Cognition Cognition Arousal/Alertness: Awake/alert Overall Cognitive Status: Appears within functional limits for tasks assessed Orientation Level: Oriented X4 Sensation/Coordination   Extremity Assessment RUE Assessment RUE Assessment: Within Functional Limits LUE Assessment LUE Assessment: Within Functional Limits Mobility  Bed Mobility Bed Mobility: Yes Rolling Right: Not tested (comment) Rolling Left: Not tested (comment) Right Sidelying to Sit: Not tested (comment) Supine to Sit: 2: Max assist;HOB elevated (Comment degrees);With rails (50 degrees) Sitting - Scoot to Edge of Bed: 3: Mod assist Transfers Transfers: Yes Sit to Stand: 1: +2 Total  assist;Other (comment);From bed;From chair/3-in-1;With upper extremity assist (pt. 50%) Stand to Sit: 3: Mod assist;With upper extremity assist;To chair/3-in-1 Exercises   End of Session OT - End of Session Equipment Utilized During Treatment: Right knee immobilizer (RW) Activity Tolerance: Patient tolerated treatment well Patient left: in chair;with call bell in reach;with family/visitor present (Spouse present) General Behavior During Session: [facility] for tasks performed Cognition: [facility] for tasks performed   Zada Girt MARIE [phone] 04/21/2011, 2:52 PM

## 2011-04-21 NOTE — Plan of Care (Signed)
Problem: Consults Goal: Diagnosis - Spinal Surgery Outcome: Completed/Met Date Met:  04/21/11 Microdiscectomy L3-4 Laminectomy

## 2011-04-21 NOTE — Progress Notes (Signed)
Physical Therapy Treatment Patient Details Name: [name] MRN: [id number] DOB: 1941/06/01 Today's Date: 04/21/2011 4098-1191 ta   PT Assessment/Plan  PT - Assessment/Plan Comments on Treatment Session: pt has more difficultstanding and pivoting PT Plan: Discharge plan remains appropriate;Frequency remains appropriate PT Frequency: 7X/week Follow Up Recommendations: Inpatient Rehab Equipment Recommended: Defer to next venue PT Goals     PT Treatment Precautions/Restrictions  Precautions Precautions: Knee Precaution Booklet Issued: No Required Braces or Orthoses: Yes Knee Immobilizer: On when out of bed or walking Restrictions Weight Bearing Restrictions: No RLE Weight Bearing: Weight bearing as tolerated Mobility (including Balance) Bed Mobility Transfers Transfers: Yes Sit to Stand: 1: +2 Total assist;Other (comment);From bed;From chair/3-in-1;With upper extremity assist (pt. 50%) Stand to Sit: 3: Mod assist;With upper extremity assist;To chair/3-in-1 Stand Pivot Transfers: 1: +2 Total assist Stand Pivot Transfer Details (indicate cue type and reason): cues to stand erect Ambulation/Gait Ambulation/Gait: No  Posture/Postural Control Posture/Postural Control: Postural limitations Postural Limitations: tends to lean forward on RW Exercise    End of Session PT - End of Session Activity Tolerance: Patient limited by pain;Patient limited by fatigue Patient left: Other (comment);with call bell in reach;with family/visitor present (pt left on [facility] with spouse present, pt to call when ready t) Nurse Communication: Mobility status for transfers General Behavior During Session: [facility] for tasks performed Cognition: [facility] for tasks performed  Rada Hay [phone] 04/21/2011, 3:58 PM

## 2011-04-22 DIAGNOSIS — D62 Acute posthemorrhagic anemia: Secondary | ICD-10-CM | POA: Diagnosis not present

## 2011-04-22 LAB — CBC
HCT: 21.9 % — ABNORMAL LOW (ref 36.0–46.0)
Hemoglobin: 7.3 g/dL — ABNORMAL LOW (ref 12.0–15.0)
MCH: 28 pg (ref 26.0–34.0)
MCHC: 33.3 g/dL (ref 30.0–36.0)
MCV: 83.9 fL (ref 78.0–100.0)
Platelets: 302 10*3/uL (ref 150–400)
RBC: 2.61 MIL/uL — ABNORMAL LOW (ref 3.87–5.11)
RDW: 15.8 % — ABNORMAL HIGH (ref 11.5–15.5)
WBC: 8.4 10*3/uL (ref 4.0–10.5)

## 2011-04-22 LAB — PROTIME-INR
INR: 1.44 (ref 0.00–1.49)
Prothrombin Time: 17.8 seconds — ABNORMAL HIGH (ref 11.6–15.2)

## 2011-04-22 LAB — PREPARE RBC (CROSSMATCH)

## 2011-04-22 MED ORDER — WARFARIN SODIUM 5 MG PO TABS
5.0000 mg | ORAL_TABLET | Freq: Every day | ORAL | Status: DC
  Administered 2011-04-22 – 2011-04-23 (×2): 5 mg via ORAL
  Filled 2011-04-22 (×3): qty 1

## 2011-04-22 NOTE — Progress Notes (Signed)
  [name] 70 y.o. 04/10/2011   Lab. Results:  Basename 04/22/11 0432 04/21/11 0500  WBC 8.4 --  HGB 7.3* 7.7*  HCT 21.9* 23.2*  PLT 302 --   BMET No results found for this basename: NA:2,K:2,CL:2,CO2:2,GLUCOSE:2,BUN:2,CREATININE:2,CALCIUM:2,CBG:2 in the last 72 hours  INR  Date Value Range Status  04/22/2011 1.44  0.00-1.49 (no units) Final   VITALS Filed Vitals:   04/22/11 0510  BP: 133/80  Pulse: 79  Temp: 98.2 F (36.8 C)  Resp: 20     I have reviewed her lab and her vitals.  Wound Check: normal  Coumadin Re-started today.SNF consult for DC planning. Will receive 2-units RBC.   Milas Schappell A 04/22/2011

## 2011-04-22 NOTE — Progress Notes (Signed)
PT canceled due receiving blood. Will follow up 04/23/11

## 2011-04-22 NOTE — Progress Notes (Signed)
CSW Psychosocial Assessment and FL2 placed in shadow chart in [facility]. Will proceed with SNF search and advise. Reece Levy, MSW, [facility]ors [phone]

## 2011-04-22 NOTE — Progress Notes (Signed)
Met with patient and her husband at bedside- patient requesting SNF placement in the [place] area. FL2 and full assessment to be completed today-  Reece Levy, MSW, [facility] [phone]

## 2011-04-23 ENCOUNTER — Encounter (HOSPITAL_COMMUNITY): Payer: Self-pay | Admitting: Orthopedic Surgery

## 2011-04-23 LAB — TYPE AND SCREEN: ABO/RH(D): A NEG

## 2011-04-23 LAB — CBC
HCT: 26.5 % — ABNORMAL LOW (ref 36.0–46.0)
Hemoglobin: 9 g/dL — ABNORMAL LOW (ref 12.0–15.0)
MCV: 84.7 fL (ref 78.0–100.0)
Platelets: 305 10*3/uL (ref 150–400)
RBC: 3.13 MIL/uL — ABNORMAL LOW (ref 3.87–5.11)
WBC: 6.8 10*3/uL (ref 4.0–10.5)

## 2011-04-23 MED ORDER — HYDROMORPHONE HCL PF 1 MG/ML IJ SOLN
0.5000 mg | INTRAMUSCULAR | Status: DC | PRN
  Administered 2011-04-23: 0.5 mg via INTRAVENOUS
  Filled 2011-04-23: qty 1

## 2011-04-23 MED ORDER — LACTATED RINGERS IV SOLN
INTRAVENOUS | Status: DC
  Administered 2011-04-23 – 2011-04-24 (×2): via INTRAVENOUS

## 2011-04-23 NOTE — Progress Notes (Signed)
Physical Therapy Treatment Patient Details Name: [name] MRN: [id number] DOB: 1941/03/17 Today's Date: 04/23/2011 14: 15 - 14:45 1 te 1ta PT Assessment/Plan  PT - Assessment/Plan Comments on Treatment Session: pt instructed on log roll technique and pt verbalized 3/3 back percautions PT Plan: Discharge plan remains appropriate Follow Up Recommendations: Inpatient Rehab Equipment Recommended: Defer to next venue PT Goals  Acute Rehab PT Goals PT Goal Formulation: With patient Pt will Roll Supine to Right Side: with supervision PT Goal: Rolling Supine to Right Side - Progress: Progressing toward goal Pt will go Supine/Side to Sit: with supervision PT Goal: Supine/Side to Sit - Progress: Progressing toward goal Pt will Transfer Sit to Stand/Stand to Sit: with supervision PT Transfer Goal: Sit to Stand/Stand to Sit - Progress: Progressing toward goal Pt will Ambulate: 51 - 150 feet;with supervision;with least restrictive assistive device PT Goal: Ambulate - Progress: Progressing toward goal Pt will Go Up / Down Stairs: 3-5 stairs;with min assist PT Goal: Up/Down Stairs - Progress: Progressing toward goal  PT Treatment Precautions/Restrictions  Precautions Precautions: Back;Knee Precaution Booklet Issued: No Precaution Comments: pt verbalized 3/3 back percautions Required Braces or Orthoses: Yes Knee Immobilizer: Discontinue once straight leg raise with < 10 degree lag Restrictions Weight Bearing Restrictions: No RLE Weight Bearing: Weight bearing as tolerated Mobility (including Balance) Bed Mobility Bed Mobility: Yes Rolling Right: 1: +2 Total assist Rolling Right Details (indicate cue type and reason): log roll to right +2 assist for safety and proper tech Supine to Sit: 1: +2 Total assist Supine to Sit Details (indicate cue type and reason): +2 assist 2nd 10/10 back pain Sitting - Scoot to Edge of Bed: 3: Mod assist Transfers Transfers: Yes Sit to Stand: 1: +2  Total assist Sit to Stand Details (indicate cue type and reason): +2 assist 2nd back pain and c/o B LE weakness Stand to Sit: 1: +2 Total assist Stand to Sit Details: 25% VC's to reach back and sit straight down Stand Pivot Transfers: 1: +2 Total assist Stand Pivot Transfer Details (indicate cue type and reason): 1/4 turn to pt's L w/ increased time and +2 assist for safety Ambulation/Gait Ambulation/Gait: No Ambulation/Gait Assistance Details (indicate cue type and reason): tranf only bed to reclliner Stairs: No Wheelchair Mobility Wheelchair Mobility: No    Exercise  Total Joint Exercises Ankle Circles/Pumps: AROM;Both;15 reps Quad Sets: AROM;Both;10 reps Gluteal Sets: AROM;Both;10 reps Towel Squeeze: AROM;Both;10 reps Short Arc QuadBarbaraann Boys;Right;10 reps Heel Slides: AAROM;Right;10 reps Hip ABduction/ADduction: AAROM;Right;10 reps Straight Leg Raises:  (did not perform SLR 2nd back pain ) End of Session PT - End of Session Equipment Utilized During Treatment: Gait belt;Right knee immobilizer Activity Tolerance: Patient tolerated treatment well Patient left: in chair;with call bell in reach;with family/visitor present Nurse Communication: Other (comment) (10/10 back pain, meds requested)  Armando Reichert 04/23/2011, 2:57 PM

## 2011-04-23 NOTE — Progress Notes (Signed)
Faxed FL2 out again today and hopeful for SNF options for d/c this week. Reece Levy, MSW, Theresia Majors [phone]

## 2011-04-23 NOTE — Progress Notes (Signed)
Subjective: 4 Days Post-Op Procedure(s) (LRB): LUMBAR LAMINECTOMY/DECOMPRESSION MICRODISCECTOMY (N/A) Patient reports pain as mild with knee and pain in low back. Patient seen in rounds with Dr. Lequita Halt. Patient has complaints of continued low back pain.  Being followed also along with Dr. Darrelyn Hillock. Hbg back up to 9.0 after blood.  Objective: Vital signs in last 24 hours: Temp:  [98.1 F (36.7 C)-99.2 F (37.3 C)] 98.1 F (36.7 C) (11/13 0523) Pulse Rate:  [72-87] 77  (11/13 0523) Resp:  [16-18] 16  (11/13 0523) BP: (119-158)/(70-90) 158/84 mmHg (11/13 0523) SpO2:  [94 %-100 %] 100 % (11/13 0523)  Intake/Output from previous day:  Intake/Output Summary (Last 24 hours) at 04/23/11 0944 Last data filed at 04/23/11 0230  Gross per 24 hour  Intake 2100.83 ml  Output    750 ml  Net 1350.83 ml    Intake/Output this shift:     Basename 04/23/11 0650 04/22/11 0432 04/21/11 0500  HGB 9.0* 7.3* 7.7*    Basename 04/23/11 0650 04/22/11 0432  WBC 6.8 8.4  RBC 3.13* 2.61*  HCT 26.5* 21.9*  PLT 305 302   No results found for this basename: NA:2,K:2,CL:2,CO2:2,BUN:2,CREATININE:2,GLUCOSE:2,CALCIUM:2 in the last 72 hours  Basename 04/23/11 0650 04/22/11 0432  LABPT -- --  INR 1.26 1.44    Exam - Neurovascular intact Sensation intact distally Intact pulses distally No cellulitis present Compartment soft Dressing/Incision - clean, dry, no drainage, healed Motor function intact - moving foot and toes well on exam.   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 Rotator cuff syndrome   . DJD (degenerative joint disease)     generalized  . Dyslipidemia   . Diabetes mellitus     borderline  . Squamous cell carcinoma   . Osteoarthritis   . HTN (hypertension)   . Dyspnea   . Spinal stenosis of lumbar region  04/17/2011    DVT Prophylaxis - Coumadin Protocol Weight-Bearing as tolerated to right leg Plan is to go to skilled facility.  Patient wants to look into the [facility] but will wait to hear about bed offers.   Tanielle Emigh 04/23/2011, 9:44 AM

## 2011-04-23 NOTE — Progress Notes (Signed)
Subjective: Pain in low back ,with  Weakness in legs.Hbg not complete at this time.   Objective: Vital signs in last 24 hours: Temp:  [98.1 F (36.7 C)-99.2 F (37.3 C)] 98.1 F (36.7 C) (11/13 0523) Pulse Rate:  [72-87] 77  (11/13 0523) Resp:  [16-18] 16  (11/13 0523) BP: (119-158)/(70-90) 158/84 mmHg (11/13 0523) SpO2:  [2 %-100 %] 100 % (11/13 0523)  Intake/Output from previous day: 11/12 0701 - 11/13 0700 In: 2100.8 [P.O.:240; I.V.:1065; Blood:595.8; IV Piggyback:200] Out: 750 [Urine:750] Intake/Output this shift: Total I/O In: 1200 [I.V.:1000; IV Piggyback:200] Out: 550 [Urine:550]   Basename 04/22/11 0432 04/21/11 0500 04/20/11 0732  HGB 7.3* 7.7* 8.5*    Basename 04/22/11 0432 04/21/11 0500  WBC 8.4 --  RBC 2.61* --  HCT 21.9* 23.2*  PLT 302 --   No results found for this basename: NA:2,K:2,CL:2,CO2:2,BUN:2,CREATININE:2,GLUCOSE:2,CALCIUM:2 in the last 72 hours  Basename 04/22/11 0432 04/21/11 0500  LABPT -- --  INR 1.44 1.41    Neurovascular intact Wound looks fine,slight hematoma present.   [name] A 04/23/2011, 7:00 AM

## 2011-04-24 LAB — CBC
HCT: 27.3 % — ABNORMAL LOW (ref 36.0–46.0)
Hemoglobin: 8.8 g/dL — ABNORMAL LOW (ref 12.0–15.0)
MCV: 86.4 fL (ref 78.0–100.0)
Platelets: 339 10*3/uL (ref 150–400)
RBC: 3.16 MIL/uL — ABNORMAL LOW (ref 3.87–5.11)
WBC: 6.6 10*3/uL (ref 4.0–10.5)

## 2011-04-24 LAB — GLUCOSE, CAPILLARY
Glucose-Capillary: 125 mg/dL — ABNORMAL HIGH (ref 70–99)
Glucose-Capillary: 171 mg/dL — ABNORMAL HIGH (ref 70–99)

## 2011-04-24 MED ORDER — METHOCARBAMOL 500 MG PO TABS: 500.0000 mg | ORAL_TABLET | Freq: Four times a day (QID) | ORAL | Status: AC | PRN

## 2011-04-24 MED ORDER — ENOXAPARIN SODIUM 40 MG/0.4ML ~~LOC~~ SOLN
40.0000 mg | SUBCUTANEOUS | Status: DC
  Administered 2011-04-24: 40 mg via SUBCUTANEOUS
  Filled 2011-04-24: qty 0.4

## 2011-04-24 MED ORDER — OXYCODONE HCL 5 MG PO TABS: 5.0000 mg | ORAL_TABLET | ORAL | Status: AC | PRN

## 2011-04-24 MED ORDER — HEPARIN SOD (PORK) LOCK FLUSH 100 UNIT/ML IV SOLN
500.0000 [IU] | Freq: Once | INTRAVENOUS | Status: AC
  Administered 2011-04-24: 500 [IU] via INTRAVENOUS

## 2011-04-24 MED ORDER — SODIUM CHLORIDE 0.9 % IJ SOLN
10.0000 mL | INTRAMUSCULAR | Status: DC | PRN
  Administered 2011-04-24: 10 mL

## 2011-04-24 NOTE — Discharge Summary (Signed)
NAMEBRYNLEE, [name] NO.:  [id number]  MEDICAL RECORD NO.:  [id number]  LOCATION:  1604                         FACILITY:  [facility]  PHYSICIAN:  Ollen Gross, M.D.    DATE OF BIRTH:  07-03-1940  DATE OF ADMISSION:  04/10/2011 DATE OF DISCHARGE:  04/24/2011                        DISCHARGE SUMMARY - REFERRING   ADDENDUM:  HOSPITAL COURSE:  Ms. Amendola was admitted to [facility] on April 10, 2011 for the above-stated problem.  She had an infected total knee, was seen in the office, admitted for irrigation and debridement.  She was taken to the operating room and underwent the above-stated procedure on April 10, 2011.  She has tolerated procedure well, later transferred to the recovery room and then to the orthopedic floor.  PICC line was ordered postoperatively.  She was placed on IV antibiotics.  She was initially placed on vancomycin and rifampin.  She was doing fairly well on the morning of day 1, started getting up out of bed, pivot in turnings.  Hemoglobin is a little low, so we put her on some iron supplementation.  By day 2, she is doing a little bit better, started getting up with therapy.  PICC line was placed.  She had a low potassium, so we gave her some potassium supplements.  She was on vancomycin and utilizing p.r.n. medications.  She started progressing with therapy and by day 3,  unfortunately over the weekend, she started having some increasing pain in her hip and low back.  She had known history of spinal stenosis.  Therefore, she was worked up.  She was seen over the weekend and having pain localized to the right buttock area and the low back area.  It was felt like she had exacerbation of her spinal stenosis.  Therefore, she was started on a Dosepak and remained on that for several days.  She was slow to progress once her back flared up with her therapy and mobility.  She was seen back on postop day 5 following the knee and having  continued pain.  By postop day 6, an MRI was ordered because of her spinal stenosis and continued rib pain that was in the buttock area.  She was actually having some radicular pain going down the leg also.  It was found out through the MRI that she had severe stenosis, which was going to require decompression.  She was evaluated by Dr. Darrelyn Hillock from orthopedic spine standpoint and it was felt that she would require surgery. Therefore, she was preop'd and taken off Coumadin, allowed her INR to drift back down to the normal level.  She was preop'd on postop day 8 to undergo the surgery.  Her INR was tracking down.  By this time, she is back down to 1.51 and then when she was postop day 9 from the knee on April 19, 2011, she was taken to the operating room and underwent procedure #2, which was the lumbar laminectomy.  She had a decompressive lumbar laminectomy at L2-3 and L3- 4, tolerated procedure well.  She was very slow to progress  after her back surgery.  She was already deconditioned from undergoing the initial surgery  a week before.  She had difficulty with pain initially, started getting up out of bed on day 1 and day 2 and had difficulty and even had some weakness in her legs.  It was noted also on postop day 2 from the back that her hemoglobin had dropped down to 7.7 indicating some acute blood loss anemia.  Therefore, she did receive some blood due to the anemia.  She was started back on her Coumadin for DVT prophylaxis.  She responded well to the blood and her hemoglobin came back up to 9.  She was about postop day 4 at this point.  She continued to receive IV antibiotics through the PICC line.  Her pain started to improve and leg pain resolved and she was having some low back pain from the knee standpoint after about 2 days out from her surgery.  We changed the dressing and checked the wound everyday.  The knee healed well and staples were intact.  No signs of infection.  After  a couple of days after the back surgery,  Dr. Darrelyn Hillock changed the dressing each day to her low back and it was healing well with no signs of infection.  By postop day 4, her hemoglobin had responded.  She was back up and then she was seen back on rounds on postop day 5 from the surgical lumbar laminectomy.  She was doing much better.  Pain was under better control. They knew they wanted to look into a skilled facility following the back surgery, so got social work involved and FL-2 was signed and sent out. It was noted later that day on postop day 5 from the back that a bed was found over [facility]  We checked with Dr. Lequita Halt and Dr. Darrelyn Hillock.  Both were okay with her going over at that time.  Therefore, arrangements were being made to be transferred over at that time.  DISCHARGE MEDICATIONS: 1. Colace 100 mg p.o. b.i.d. 2. Lovenox injection 40 mg subcu injection daily; continue it for 4     more days, then discontinue the Lovenox. 3. Hydrochlorothiazide 12.5 mg daily. 4. Labetalol 300 mg twice a day. 5. Lisinopril 20 mg daily. 6. Nu-Iron 150 mg daily. 7. Rifampin 300 mg daily for 4 more weeks and discontinue. 8. Kenalog 0.1% cream topical twice a day. 9. Vancomycin 1000 mg IV q.12 hours via PICC line. 10.Coumadin protocol.  Please titrate the Coumadin level for target     INR between 2.0 and 3.0. 11.Tylenol 325 one or two every 4-6 hours as needed for mild pain,     temperature, or headache. 12.Laxative of choice. 13.Enema of choice. 14.Benadryl 25 mg p.o. every 4-6 hours p.r.n. itching. 15.Robaxin 500 mg p.o. q.6-8 hours p.r.n. spasm. 16.OxyIR 5 mg one to two tabs every 4 hours as needed for moderate     pain. 17.Zofran 4 mg p.o. every 6 hours p.r.n. nausea. 18.Ambien 5 mg p.o. q.h.s. p.r.n. sleep.     [name], P.A.C.   ______________________________ Ollen Gross, M.D.    ALP/MEDQ  D:  04/24/2011  T:  04/24/2011  Job:  [id number]  cc:   Ollen Gross, M.D. Fax: [phone]  Georges Lynch. Darrelyn Hillock, M.D. Fax: [phone]

## 2011-04-24 NOTE — Progress Notes (Signed)
Subjective: 5 Days Post-Op Procedure(s) (LRB): LUMBAR LAMINECTOMY/DECOMPRESSION MICRODISCECTOMY (N/A) Patient reports pain as mild right knee. Still with back/hip pain.  Patient seen in rounds with Dr. Lequita Halt. Patient has complaints of 1-2 episodes of diarrhea.  Objective: Vital signs in last 24 hours: Temp:  [97.9 F (36.6 C)-98.8 F (37.1 C)] 97.9 F (36.6 C) (11/14 0525) Pulse Rate:  [78-81] 78  (11/14 0525) Resp:  [18-20] 20  (11/14 0525) BP: (114-158)/(70-75) 158/72 mmHg (11/14 0525) SpO2:  [93 %-97 %] 97 % (11/14 0525)  Intake/Output from previous day:  Intake/Output Summary (Last 24 hours) at 04/24/11 0744 Last data filed at 04/24/11 0600  Gross per 24 hour  Intake   1060 ml  Output    101 ml  Net    959 ml    Intake/Output this shift:     Basename 04/24/11 0555 04/23/11 0650 04/22/11 0432  HGB 8.8* 9.0* 7.3*    Basename 04/24/11 0555 04/23/11 0650  WBC 6.6 6.8  RBC 3.16* 3.13*  HCT 27.3* 26.5*  PLT 339 305   No results found for this basename: NA:2,K:2,CL:2,CO2:2,BUN:2,CREATININE:2,GLUCOSE:2,CALCIUM:2 in the last 72 hours  Basename 04/24/11 0555 04/23/11 0650  LABPT -- --  INR 1.36 1.26    Exam - Neurologically intact ABD soft Incision: no drainage Compartment soft Dressing/Incision - clean, dry, no drainage Motor function intact - moving foot and toes well on exam.   Assessment/Plan: 5 Days Post-Op Procedure(s) (LRB): LUMBAR LAMINECTOMY/DECOMPRESSION MICRODISCECTOMY (N/A)  Up with therapy Discharge to SNF when bed available Begin Lovenox until INR >1.8  Past Medical History  Diagnosis Date  . Rotator cuff syndrome   . DJD (degenerative joint disease)     generalized  . Dyslipidemia   . Diabetes mellitus     borderline  . Squamous cell carcinoma   . Osteoarthritis   . HTN (hypertension)   . Dyspnea   . Spinal stenosis of lumbar region 04/17/2011    DVT Prophylaxis - Xarelto / Coumadin Protocol Weight-Bearing as tolerated to right  leg  Carman Auxier V 04/24/2011, 7:44 AM

## 2011-04-24 NOTE — Progress Notes (Signed)
Pt d/c to [facility]illed nursing facility.  Pt was d/c with her piccline in place for continued IV antibiotic therapy.  Dressing change to back and rt knee was done by Clarissa, RN and also the staples were removed from her rt knee by Josiah Lobo, RN.  Pt was stable and ready for d/c.

## 2011-04-24 NOTE — Discharge Summary (Signed)
Physician Discharge Summary   Patient ID: [name] MRN: [id number] DOB/AGE: 1940-08-25 70 y.o.  Admit date: 04/10/2011 Discharge date: 04/24/2011  Primary Diagnosis: Infected Right Total Knee  Admission Diagnoses: Past Medical History  Diagnosis Date  . Rotator cuff syndrome   . DJD (degenerative joint disease)     generalized  . Dyslipidemia   . Diabetes mellitus     borderline  . Squamous cell carcinoma   . Osteoarthritis   . HTN (hypertension)   . Dyspnea   . Spinal stenosis of lumbar region 04/17/2011    Discharge Diagnoses:  Active Problems:  Acute blood loss anemia S/P Transfusion Exacerbation of Spinal Stenosis Hypokalemia   Procedure: Right knee arthrotomy, irrigation debridement, and  polyethylene liner exchange.  Procedure(s) (LRB): LUMBAR LAMINECTOMY/DECOMPRESSION MICRODISCECTOMY (N/A)   Consults: orthopedic surgery, Dr. Worthy Rancher  HPI:  [name] is a 70 year old female, had a revision  of right total knee arthroplasty done several months ago. She did fine  with no problems until this past weekend when she developed pain and  swelling in the knee. She did not have any prodromal symptoms. I saw  her in the office yesterday, the knee was swollen. I aspirated the knee  and there was seropurulent fluid. We admitted her to the hospital today  for formal open irrigation and debridement and polyethylene exchange.  Laboratory Data:     Glucose-Capillary      128          CHEM PROFILE    Sodium      137 135 135       Potassium      3.8 2.9  3.7        Chloride      99 95 97       CO2      32 34 31       BUN      11 6 8        Creatinine, Ser      0.78 0.73 0.60       Calcium      8.7 8.6 8.8       GFR calc non Af Amer      84 86 90 mL/min">90       GFR calc Af Amer      90 mL/min  The eGFR has been calculated using the CKD EPI equation. This calculation has not been validated in all clinical situations. eGFR's persistently <90 mL/min  signify possible Chronic Kidney Disease.">9090 mL/min  The eGFR has been calculated using the CKD EPI equation. This calculation has not been validated in all clinical situations. eGFR's persistently <90 mL/min signify possible Chronic Kidney Disease." border=0 src="file:///C:/PROGRAM%20FILES%20(X86)/EPICSYS/V7.8/EN-US/Images/IP_COMMENT_[email or web address].gif" width=5 height=10 90 mL/min  The eGFR has been calculated using the CKD EPI equation. This calculation has not been validated in all clinical situations. eGFR's persistently <90 mL/min signify possible Chronic Kidney Disease.">9090 mL/min  The eGFR has been calculated using the CKD EPI equation. This calculation has not been validated in all clinical situations. eGFR's persistently <90 mL/min signify possible Chronic Kidney Disease." border=0 src="file:///C:/PROGRAM%20FILES%20(X86)/EPICSYS/V7.8/EN-US/Images/IP_COMMENT_EXIST.gif" width=5 height=10 90 mL/min  The eGFR has been calculated using the CKD EPI equation. This calculation has not been validated in all clinical situations. eGFR's persistently <90 mL/min signify possible Chronic Kidney Disease.">9090 mL/min  The eGFR has been calculated using the CKD EPI equation. This calculation has not been validated in all clinical situations. eGFR's persistently <90 mL/min signify possible Chronic Kidney Disease." border=0 src="file:///C:/PROGRAM%20FILES%20(X86)/EPICSYS/V7.8/EN-US/Images/IP_COMMENT_EXIST.gif" width=5 height=10  Glucose, Bld      166 114 121       Alkaline Phosphatase      250         Albumin      3.1         AST      57         ALT      68         Total Protein      6.7         Total Bilirubin      1.3              Glucose-Capillary        150    125    CHEM PROFILE    Creatinine, Ser      0.67         GFR calc non Af Amer      88         GFR calc Af Amer      90 mL/min  The eGFR has been calculated using the CKD EPI equation. This calculation has not been validated in all clinical  situations. eGFR's persistently <90 mL/min signify possible Chronic Kidney Disease.">9090 mL/min  The eGFR has been calculated using the CKD EPI equation. This calculation has not been validated in all clinical situations. eGFR's persistently <90 mL/min signify possible Chronic Kidney Disease." border=0 src="file:///C:/PROGRAM%20FILES%20(X86)/EPICSYS/[email or web address]/EN-US/Images/IP_COMMENT_EXIST.gif" width=5 height=10          CBC    WBC 10.4 9.7 8.1 8.7  8.4   8.4 6.8 6.6    RBC 3.35 3.35 3.33 3.50  3.47   2.61 3.13 3.16    Hemoglobin 9.3 9.2 9.3 9.6  9.5 9.2 7.7 7.3 9.0  8.8    HCT 27.8 28.1 28.2 29.7  29.7 28.3 23.2 21.9 26.5 27.3    MCV 83.0 83.9 84.7 84.9  85.6   83.9 84.7 86.4    MCH 27.8 27.5 27.9 27.4  27.4   28.0 28.8 27.8    MCHC 33.5 32.7 33.0 32.3  32.0   33.3 34.0 32.2    RDW 16.2 16.2 16.3 16.1  16.1   15.8 16.0 16.0    Platelets 307 319 326 353  386   302 305 339     DIFFERENTIAL    Neutrophils Relative    73           Lymphocytes Relative    15           Monocytes Relative    12           Eosinophils Relative    0           Basophils Relative    0           Neutro Abs    6.4           Lymphs Abs    1.3           Monocytes Absolute    1.0           Eosinophils Absolute    0.0           Basophils Absolute    0.0            PROTIME W/ INR    Prothrombin Time 17.2  21.0  20.4  20.9  20.5 17.8 17.4 17.5 17.8 16.1 17.0    INR 1.38 1.78 1.71 1.77 1.72 1.44 1.40 1.41 1.44 1.26 1.36  PTT    aPTT     46  34          ANTIBIOTICS    Vancomycin Tr      16.3          DIABETES    Glucose-Capillary        150    125      X-Rays:Dg Chest 2 View  04/12/2011  *RADIOLOGY REPORT*  Clinical Data: Postop right total knee arthroplasty  CHEST - 2 VIEW  Comparison: 10/30/2010  Findings:  There is a right arm PICC line with tip in the SVC.  The heart size appears mildly enlarged.  There is no pleural effusion or pulmonary edema identified.  There is no airspace consolidation  identified.  The lung volumes are low and there are coarsened interstitial markings bilaterally.  IMPRESSION:  1.  No acute cardiopulmonary abnormalities. 2.  Low lung volumes. No evidence for pneumonia or heart failure.  Original Report Authenticated By: Rosealee Albee, M.D.   Dg Hip Complete Right  04/13/2011  *RADIOLOGY REPORT*  Clinical Data: Fall 94 knee surgery for infection.  Increasing pain in the right hip radiating down the right femur.  RIGHT HIP - COMPLETE 2+ VIEW  Comparison: CT scan of the pelvis and right hip, 04/13/2011  Findings: No fracture.  The hip joints are normally spaced and aligned.  No hip joint degenerative changes evident.  There is sclerosis on each side of the right SI joint.  The SI joint is minimally widened.  This appearance is stable from the prior pelvis CT.  The bony pelvis is otherwise unremarkable.  There are degenerative changes in the lower lumbar spine.  Surrounding soft tissues are unremarkable.  IMPRESSION: No right hip fracture or right hip joint abnormality.  Sclerosis about the right SI joint appears chronic, but could be the source of pain.  Original Report Authenticated By:    Varney Biles Femur Right  04/13/2011  *RADIOLOGY REPORT*  Clinical Data: Fall, right knee surgery for infection  RIGHT FEMUR - 2 VIEW  Comparison: Knee radiographs dated 04/07/2011  Findings: No fracture or dislocation is seen.  Right total knee arthroplasty, incompletely visualized.  Overlying skin staples with associated soft tissue swelling/subcutaneous gas.  IMPRESSION: No fracture or dislocation is seen.  Right total knee arthroplasty, incompletely visualized, with associated postsurgical changes.  Original Report Authenticated By: Charline Bills, M.D.   Dg Abd 1 View  04/12/2011  *RADIOLOGY REPORT*  Clinical Data: 70 year old female status post knee surgery 1 day ago.  Abdominal pain, shortness of breath.  ABDOMEN - 1 VIEW  Comparison: Lumbar radiographs 11/13/2007.  Findings: Supine  views of the abdomen and pelvis. Nonobstructed bowel gas pattern.  Cholelithiasis.  Stable degenerative changes in the lower lumbar spine, right SI joint, and pubic symphysis. Abdominal and pelvic visceral contours within normal limits.  IMPRESSION: 1. Nonobstructed bowel gas pattern. 2.  Chronic cholelithiasis, right sacroiliitis.  Original Report Authenticated By: Harley Hallmark, M.D.   Mr Lumbar Spine Wo Contrast  04/16/2011  *RADIOLOGY REPORT*  Clinical Data: Low back pain, right greater than left.  Bilateral radiculopathy.  MRI LUMBAR SPINE WITHOUT CONTRAST  Technique:  Multiplanar and multiecho pulse sequences of the lumbar spine were obtained without intravenous contrast.  Comparison: MRI dated 11/13/2007  Findings: l scan extends from T10-11 through S3.  Tip of the conus is at L2, slightly low-lying.  No significant abnormality of the paraspinal soft tissues.  T10-11 through T12-L1:  No significant abnormalities.  L1-2:  New small broad-based disc protrusion extending into both neural foramina.  The L1 nerves exit without impingement.  New hypertrophy of the ligamenta flava and facets causing moderate spinal stenosis.  L2-3:  7 mm retrolisthesis of L2 on L3 with marked disc space narrowing and spurring from the inferior endplate of L2. Hypertrophied ligamenta flava creates moderately severe spinal stenosis, minimally increased since the prior study.  L3-4:  Broad-based disc protrusion with increased hypertrophy of the ligamenta flava causing almost complete occlusion of the spinal canal, worse than on the prior study.  L4-5:  Diffuse broad-based disc bulge and ligamentum flavum hypertrophy causes mild spinal stenosis without focal nerve root impingement, unchanged.  L5-S1:  There is increased disc space narrowing and increased spondylolisthesis of 8 mm.  Increased hypertrophy of the ligamenta flava which appears to slightly compress both S1 nerve roots below the level of the disc space.  Increased  bilateral foraminal stenosis at L5-L1 could affect either or both L5 nerve roots.  IMPRESSION:  1.  Increased severe spinal stenosis at L3-4 with almost complete occlusion of the spinal canal. 2.  Increased spondylosis and bilateral foraminal stenosis and bilateral lateral recess stenosis at L5-S1.  Original Report Authenticated By: Gwynn Burly, M.D.   Ct Hip Right Wo Contrast  04/13/2011  *RADIOLOGY REPORT*  Clinical Data: Recent fall.  Hip planes.  The patient cannot move hip.  CT OF THE RIGHT HIP WITHOUT CONTRAST  Technique:  Multidetector CT imaging was performed according to the standard protocol. Multiplanar CT image reconstructions were also generated.  Comparison: Lumbar spine images, 11/13/2007  Findings: There is no fracture.  The right hip joint is normally aligned.  There is mild degenerative cystic change along the superior lateral acetabulum.  No other right hip degenerative changes seen.  There is sclerosis along the SI joint on the right, greater on the iliac side than the sacral side.  This is a chronic finding that was present on the prior lumbar spine radiographs.  There is a grade 1 anterolisthesis and degenerative changes at L5-S1, incompletely imaged.  Soft tissue evaluation shows enlarged right inguinal lymph nodes, the largest measuring 2 cm in short axis.  There is no evidence of a muscle tear or strain.  No joint effusion is seen.  No masses or abnormal fluid collections are noted within the visualized pelvis.  IMPRESSION: No evidence of a fracture or acute bony injury.  Inguinal adenopathy.  Original Report Authenticated By:    Dg Spine Portable 1 View  04/19/2011  *RADIOLOGY REPORT*  Clinical Data: Spinal stenosis L3-4.  DG SPINE PORTABLE - 1 VIEW  Comparison: Radiographs dated 04/19/2011 and MRI dated 04/16/2011  Findings: There is one instrument at the level of the pedicles of L3 and another instrument at the level of the pedicles of L4.  IMPRESSION: Instruments at the L3-4  level.  Original Report Authenticated By: Gwynn Burly, M.D.   Dg Spine Portable 1 View  04/19/2011  *RADIOLOGY REPORT*  Clinical Data: Back pain  DG SPINE PORTABLE - 1 VIEW  Comparison: None.  Findings: Intraoperative portable film #2 demonstrates clamps on the spinous processes of L2 and L3.  IMPRESSION: As above.  Original Report Authenticated By: Elsie Stain, M.D.   Dg Spine Portable 1 View  04/19/2011  *RADIOLOGY REPORT*  Clinical Data: Surgical decompression L3-4.  DG SPINE PORTABLE - 1 VIEW  Comparison: MRI lumbar spine dated 04/16/2011  Findings: Stable alignment with extensive multilevel degenerative changes.  Surgical  probes at the L2-3 and L3-4 posterior spinous interspaces.  IMPRESSION: Surgical probes at the L2-3 and L3-4 posterior spinous interspaces.  Original Report Authenticated By: Charline Bills, M.D.   Dg Knee Complete 4 Views Right  04/07/2011  *RADIOLOGY REPORT*  Clinical Data: Pain and swelling  RIGHT KNEE - COMPLETE 4+ VIEW  Comparison: None.  Findings: Four views of the left knee submitted.  No acute fracture or subluxation.  There is left knee prosthesis in anatomic alignment.  There is diffuse osteopenia.  Moderate joint effusion. There is linear lucency below the prosthesis in medial aspect of the tibial plateau.  Loosening of prosthesis at this level cannot be excluded.  IMPRESSION: No acute fracture or subluxation.  Diffuse osteopenia.  Moderate joint effusion.There is linear lucency below the prosthesis in medial aspect of the tibial plateau.  Loosening of prosthesis at this level cannot be excluded.  Original Report Authenticated By: Natasha Mead, M.D.    EKG: Orders placed during the hospital encounter of 11/07/10  . EKG     Hospital Course  Dictation [phone]   Discharge Medications:  Dictation [id number]   Diet:low sodium  Activity:WBAT  Follow-up:in 2 weeks  Disposition: To [facility]  Discharged Condition: stable   Discharge  Orders    Future Orders Please Complete By Expires   Diet - low sodium heart healthy      Call MD / Call 911      Comments:   If you experience chest pain or shortness of breath, CALL 911 and be transported to the hospital emergency room.  If you develope a fever above 101 F, pus (white drainage) or increased drainage or redness at the wound, or calf pain, call your surgeon's office.   Constipation Prevention      Comments:   Drink plenty of fluids.  Prune juice may be helpful.  You may use a stool softener, such as Colace (over the counter) 100 mg twice a day.  Use MiraLax (over the counter) for constipation as needed.   Increase activity slowly as tolerated      Weight Bearing as taught in Physical Therapy      Comments:   Use a walker or crutches as instructed.   Discharge instructions      Comments:   Pick up stool softner and laxative for home. Do not submerge incision under water. May shower. Back Precautions    Driving restrictions      Comments:   No driving   Lifting restrictions      Comments:   No lifting   Change dressing      Comments:   Change dressing daily with sterile 4 x 4 inch gauze dressing and apply TED hose.     Current Discharge Medication List    START taking these medications   Details  methocarbamol (ROBAXIN) 500 MG tablet Take 1 tablet (500 mg total) by mouth every 6 (six) hours as needed. Qty: 90 tablet, Refills: 0      CONTINUE these medications which have CHANGED   Details  oxyCODONE (OXY IR/ROXICODONE) 5 MG immediate release tablet Take 1-2 tablets (5-10 mg total) by mouth every 4 (four) hours as needed for pain. Qty: 90 tablet, Refills: 0      CONTINUE these medications which have NOT CHANGED   Details  iron polysaccharides (NIFEREX) 150 MG capsule Take 150 mg by mouth daily.      labetalol (NORMODYNE) 300 MG tablet Take 300 mg by mouth 2 (two) times  daily.      quinapril-hydrochlorothiazide (ACCURETIC) 20-12.5 MG per tablet Take 1  tablet by mouth daily.        STOP taking these medications     aspirin 325 MG tablet      Multiple Vitamin (MULTIVITAMIN) tablet      Omega-3 Fatty Acids (FISH OIL) 1000 MG CAPS      vitamin E 400 UNIT capsule        Follow-up Information    Follow up with ALUISIO,FRANK V in 2 weeks.   Contact information:   [facility] [address] [place] [address] [phone]          Signed: Patrica Duel 04/24/2011, 10:49 AM

## 2011-04-24 NOTE — Progress Notes (Signed)
Patient and husband selected SNF bed at [facility]. This is close to their home- plan transfer via EMS. Reece Levy, MSW, Theresia Majors [phone]

## 2011-04-24 NOTE — Progress Notes (Signed)
Subjective: Dressing Dry. Doing much better   Objective: Vital signs in last 24 hours: Temp:  [97.9 F (36.6 C)-98.8 F (37.1 C)] 97.9 F (36.6 C) (11/14 0525) Pulse Rate:  [78-81] 78  (11/14 0525) Resp:  [18-20] 20  (11/14 0525) BP: (114-158)/(70-75) 158/72 mmHg (11/14 0525) SpO2:  [93 %-97 %] 97 % (11/14 0525)  Intake/Output from previous day: 11/13 0701 - 11/14 0700 In: 1060 [P.O.:480; I.V.:180; IV Piggyback:400] Out: 101 [Urine:100] Intake/Output this shift:     Basename 04/24/11 0555 04/23/11 0650 04/22/11 0432  HGB 8.8* 9.0* 7.3*    Basename 04/24/11 0555 04/23/11 0650  WBC 6.6 6.8  RBC 3.16* 3.13*  HCT 27.3* 26.5*  PLT 339 305   No results found for this basename: NA:2,K:2,CL:2,CO2:2,BUN:2,CREATININE:2,GLUCOSE:2,CALCIUM:2 in the last 72 hours  Basename 04/24/11 0555 04/23/11 0650  LABPT -- --  INR 1.36 1.26    Neurologically intact  Assessment/Plan: Rehab. And follow Hbg and INR   [name] 04/24/2011, 7:20 AM

## 2011-05-05 NOTE — H&P (Signed)
Chief Complaint:  right knee pain.  Subjective: Patient is admitted for right pain and effusion.  Patient is a 70 y.o. female who has been seen by Dr. Lequita Halt for ongoing knee pain. She had a knee replacement in May 2012.  She has had increasing pain and swelling and was admitted from the office after questionable aspiration done in the office.  Allergies: No Known Allergies   Medications: No prescriptions prior to admission    Past Medical History: Past Medical History  Diagnosis Date  . Rotator cuff syndrome   . DJD (degenerative joint disease)     generalized  . Dyslipidemia   . Diabetes mellitus     borderline  . Squamous cell carcinoma   . Osteoarthritis   . HTN (hypertension)   . Dyspnea   . Spinal stenosis of lumbar region 04/17/2011     Past Surgical History: Past Surgical History  Procedure Date  . Rotator cuff repair     bilaterally  . Cataract extraction   . Lumbar laminectomy/decompression microdiscectomy 04/19/2011    Procedure: LUMBAR LAMINECTOMY/DECOMPRESSION MICRODISCECTOMY;  Surgeon: Jacki Cones;  Location: [facility];  Service: Orthopedics;  Laterality: N/A;  Central decompression Lumbar two to lumbar three and Three to Lumbar Four    (xray)  S/P Right Total Knee Replacement  Family History: Family History  Problem Relation Age of Onset  . Heart attack Mother   . Heart attack Brother   . Heart attack Brother   . Heart attack Brother   . Coronary artery disease Other     Social History: History  Substance Use Topics  . Smoking status: Never Smoker   . Smokeless tobacco: Not on file  . Alcohol Use: No     Review of Systems General: No chills, fevers, night sweats, fatigue. Neuro:  No seizures, syncope, paralysis, visual problems. Respiratory:  No shortness of breath, productive cough, hemoptysis. Cardiovascular: No chest pain, angina, palpitations, orthopnea. GI: No nausea, vomiting, diarrhea, constipation. GU:  No dysuria, hematuria,  discharge. Musculoskeletal:  Joint pain and swelling  Physical Exam:  Vitals: BP 158/72  Pulse 78  Temp(Src) 97.9 F (36.6 C) (Oral)  Resp 20  Ht 5' 5.35" (1.66 m)  Wt 95.3 kg (210 lb 1.6 oz)  BMI 34.58 kg/m2  SpO2 97%  General Appearance:    Alert, cooperative, no distress, appears stated age  Head:    Normocephalic, without obvious abnormality, atraumatic  Eyes:    PERR, EOM's intact,       Nose:   Nares normal, septum midline, mucosa normal, no drainage    or sinus tenderness  Throat:   Lips, mucosa, and tongue normal; teeth and gums normal  Neck:   Supple, symmetrical, trachea midline, no adenopathy;    thyroid:  no enlargement/tenderness/nodules; no carotid   bruit or JVD       Lungs:     Clear to auscultation bilaterally, respirations unlabored      Heart:    Regular rate and rhythm, S1 and S2 normal, no murmur, rub   or gallop     Abdomen:     Soft, non-tender, bowel sounds active all four quadrants,    no masses, no organomegaly  Genitalia:    Not done, defered.     Extremities:   Prev Right Knee incision, Knee effusion present. Painful range of motion.  Skin stasis ulcers lower leg  Pulses:   2+ and symmetric all extremities  Skin:   Skin color, texture, turgor normal, no  rashes or lesions          Assessment/Plan: Painful effusion right knee, rule out infection  The patient is being admitted to [facility] to undergo irrigation and debridement with poly exchange.   Avel Peace, PA-C

## 2011-06-12 DIAGNOSIS — M1710 Unilateral primary osteoarthritis, unspecified knee: Secondary | ICD-10-CM | POA: Diagnosis not present

## 2011-06-12 DIAGNOSIS — R262 Difficulty in walking, not elsewhere classified: Secondary | ICD-10-CM | POA: Diagnosis not present

## 2011-06-13 DIAGNOSIS — M1710 Unilateral primary osteoarthritis, unspecified knee: Secondary | ICD-10-CM | POA: Diagnosis not present

## 2011-06-13 DIAGNOSIS — R262 Difficulty in walking, not elsewhere classified: Secondary | ICD-10-CM | POA: Diagnosis not present

## 2011-06-14 DIAGNOSIS — M1710 Unilateral primary osteoarthritis, unspecified knee: Secondary | ICD-10-CM | POA: Diagnosis not present

## 2011-06-14 DIAGNOSIS — R262 Difficulty in walking, not elsewhere classified: Secondary | ICD-10-CM | POA: Diagnosis not present

## 2011-06-18 DIAGNOSIS — R262 Difficulty in walking, not elsewhere classified: Secondary | ICD-10-CM | POA: Diagnosis not present

## 2011-06-18 DIAGNOSIS — M1710 Unilateral primary osteoarthritis, unspecified knee: Secondary | ICD-10-CM | POA: Diagnosis not present

## 2011-06-20 DIAGNOSIS — R262 Difficulty in walking, not elsewhere classified: Secondary | ICD-10-CM | POA: Diagnosis not present

## 2011-06-20 DIAGNOSIS — M1710 Unilateral primary osteoarthritis, unspecified knee: Secondary | ICD-10-CM | POA: Diagnosis not present

## 2011-06-21 DIAGNOSIS — R262 Difficulty in walking, not elsewhere classified: Secondary | ICD-10-CM | POA: Diagnosis not present

## 2011-06-21 DIAGNOSIS — M1710 Unilateral primary osteoarthritis, unspecified knee: Secondary | ICD-10-CM | POA: Diagnosis not present

## 2011-06-26 DIAGNOSIS — M25469 Effusion, unspecified knee: Secondary | ICD-10-CM | POA: Diagnosis not present

## 2011-06-26 DIAGNOSIS — M25569 Pain in unspecified knee: Secondary | ICD-10-CM | POA: Diagnosis not present

## 2011-06-26 DIAGNOSIS — Z96659 Presence of unspecified artificial knee joint: Secondary | ICD-10-CM | POA: Diagnosis not present

## 2011-08-09 DIAGNOSIS — T84498A Other mechanical complication of other internal orthopedic devices, implants and grafts, initial encounter: Secondary | ICD-10-CM | POA: Diagnosis not present

## 2011-08-12 ENCOUNTER — Other Ambulatory Visit (HOSPITAL_COMMUNITY): Payer: Self-pay | Admitting: Orthopedic Surgery

## 2011-08-12 DIAGNOSIS — M25561 Pain in right knee: Secondary | ICD-10-CM

## 2011-08-12 DIAGNOSIS — T84032A Mechanical loosening of internal right knee prosthetic joint, initial encounter: Secondary | ICD-10-CM

## 2011-08-19 ENCOUNTER — Encounter (HOSPITAL_COMMUNITY)
Admission: RE | Admit: 2011-08-19 | Discharge: 2011-08-19 | Disposition: A | Discharge status: Home / Self Care | Payer: Medicare Other | Source: Ambulatory Visit | Attending: Orthopedic Surgery | Admitting: Orthopedic Surgery

## 2011-08-19 DIAGNOSIS — Z96659 Presence of unspecified artificial knee joint: Secondary | ICD-10-CM | POA: Insufficient documentation

## 2011-08-19 DIAGNOSIS — T84032A Mechanical loosening of internal right knee prosthetic joint, initial encounter: Secondary | ICD-10-CM

## 2011-08-19 DIAGNOSIS — M25561 Pain in right knee: Secondary | ICD-10-CM

## 2011-08-19 DIAGNOSIS — M25569 Pain in unspecified knee: Secondary | ICD-10-CM | POA: Insufficient documentation

## 2011-08-19 MED ORDER — TECHNETIUM TC 99M MEDRONATE IV KIT
24.0000 | PACK | Freq: Once | INTRAVENOUS | Status: AC | PRN
  Administered 2011-08-19: 24 via INTRAVENOUS

## 2011-08-22 DIAGNOSIS — Z96659 Presence of unspecified artificial knee joint: Secondary | ICD-10-CM | POA: Diagnosis not present

## 2011-08-22 DIAGNOSIS — M25569 Pain in unspecified knee: Secondary | ICD-10-CM | POA: Diagnosis not present

## 2011-09-05 DIAGNOSIS — T84498A Other mechanical complication of other internal orthopedic devices, implants and grafts, initial encounter: Secondary | ICD-10-CM | POA: Diagnosis not present

## 2011-09-12 DIAGNOSIS — I10 Essential (primary) hypertension: Secondary | ICD-10-CM | POA: Diagnosis not present

## 2011-09-20 DIAGNOSIS — T84498A Other mechanical complication of other internal orthopedic devices, implants and grafts, initial encounter: Secondary | ICD-10-CM | POA: Diagnosis not present

## 2011-09-22 ENCOUNTER — Other Ambulatory Visit: Payer: Self-pay | Admitting: Orthopedic Surgery

## 2011-09-27 ENCOUNTER — Encounter (HOSPITAL_COMMUNITY): Payer: Self-pay

## 2011-09-27 ENCOUNTER — Encounter (HOSPITAL_COMMUNITY): Payer: Self-pay | Admitting: Pharmacy Technician

## 2011-09-27 ENCOUNTER — Encounter (HOSPITAL_COMMUNITY)
Admission: RE | Admit: 2011-09-27 | Discharge: 2011-09-27 | Disposition: A | Discharge status: Home / Self Care | Payer: Medicare Other | Source: Ambulatory Visit | Attending: Orthopedic Surgery | Admitting: Orthopedic Surgery

## 2011-09-27 DIAGNOSIS — M75100 Unspecified rotator cuff tear or rupture of unspecified shoulder, not specified as traumatic: Secondary | ICD-10-CM

## 2011-09-27 DIAGNOSIS — G5601 Carpal tunnel syndrome, right upper limb: Secondary | ICD-10-CM

## 2011-09-27 DIAGNOSIS — J449 Chronic obstructive pulmonary disease, unspecified: Secondary | ICD-10-CM

## 2011-09-27 DIAGNOSIS — L309 Dermatitis, unspecified: Secondary | ICD-10-CM

## 2011-09-27 DIAGNOSIS — K8020 Calculus of gallbladder without cholecystitis without obstruction: Secondary | ICD-10-CM

## 2011-09-27 HISTORY — DX: Chronic obstructive pulmonary disease, unspecified: J44.9

## 2011-09-27 HISTORY — PX: JOINT REPLACEMENT: SHX530

## 2011-09-27 HISTORY — DX: Dermatitis, unspecified: L30.9

## 2011-09-27 HISTORY — DX: Calculus of gallbladder without cholecystitis without obstruction: K80.20

## 2011-09-27 HISTORY — DX: Carpal tunnel syndrome, right upper limb: G56.01

## 2011-09-27 HISTORY — DX: Unspecified rotator cuff tear or rupture of unspecified shoulder, not specified as traumatic: M75.100

## 2011-09-27 LAB — CBC
HCT: 31.1 % — ABNORMAL LOW (ref 36.0–46.0)
Hemoglobin: 9.7 g/dL — ABNORMAL LOW (ref 12.0–15.0)
MCH: 24.7 pg — ABNORMAL LOW (ref 26.0–34.0)
MCV: 79.3 fL (ref 78.0–100.0)
RBC: 3.92 MIL/uL (ref 3.87–5.11)

## 2011-09-27 LAB — COMPREHENSIVE METABOLIC PANEL
BUN: 17 mg/dL (ref 6–23)
CO2: 28 mEq/L (ref 19–32)
Calcium: 9.7 mg/dL (ref 8.4–10.5)
Creatinine, Ser: 0.88 mg/dL (ref 0.50–1.10)
GFR calc Af Amer: 76 mL/min — ABNORMAL LOW (ref >90)
GFR calc non Af Amer: 66 mL/min — ABNORMAL LOW (ref >90)
Glucose, Bld: 108 mg/dL — ABNORMAL HIGH (ref 70–99)
Total Bilirubin: 0.3 mg/dL (ref 0.3–1.2)

## 2011-09-27 LAB — URINALYSIS, ROUTINE W REFLEX MICROSCOPIC
Bilirubin Urine: NEGATIVE
Ketones, ur: NEGATIVE mg/dL
Nitrite: NEGATIVE
Protein, ur: NEGATIVE mg/dL

## 2011-09-27 LAB — SURGICAL PCR SCREEN: MRSA, PCR: NEGATIVE

## 2011-09-27 LAB — PROTIME-INR: INR: 1.08 (ref 0.00–1.49)

## 2011-09-27 NOTE — Pre-Procedure Instructions (Addendum)
09-27-11 EKG(10-30-10), CXR( 04-12-11) reports in epic. 09-27-11 1645 [name] made aware of Hgb- 9.7.W. Irine Heminger,RN

## 2011-09-27 NOTE — Patient Instructions (Addendum)
20 [name]  09/27/2011   Your procedure is scheduled on: 4-24  -2013  Report to [facility] at      2:45 PM.  Call this number if you have problems the morning of surgery: [phone]   Remember:   Do not eat food:After Midnight.  May have clear liquids:up to 6 Hours before arrival. Nothing after :1100 am  Clear liquids include soda, tea, black coffee, apple or grape juice, broth.  Take these medicines the morning of surgery with A SIP OF WATER: Labetalol   Do not wear jewelry, make-up or nail polish.  Do not wear lotions, powders, or perfumes. You may wear deodorant.  Do not shave 48 hours prior to surgery.(face and neck okay, no shaving of legs)  Do not bring valuables to the hospital.  Contacts, dentures or bridgework may not be worn into surgery.  Leave suitcase in the car. After surgery it may be brought to your room.  For patients admitted to the hospital, checkout time is 11:00 AM the day of discharge.   Patients discharged the day of surgery will not be allowed to drive home.  Name and phone number of your driver: family  Special Instructions: CHG Shower Use Special Wash: 1/2 bottle night before surgery and 1/2 bottle morning of surgery.(avoid face and genitals)   Please read over the following fact sheets that you were given: MRSA Information, Blood Transfusion fact sheet, Incentive Spirometry Instruction.

## 2011-09-29 ENCOUNTER — Other Ambulatory Visit: Payer: Self-pay | Admitting: Orthopedic Surgery

## 2011-09-29 NOTE — H&P (Signed)
[name]  DOB: 1941-04-28 Married / Language: English / Race: White Female  Date of Admission:  10/02/2011  Chief Complaint:  Infected Right Total Knee Arthroplasty  History of Present Illness The patient is a 71 year old female who comes in for a preoperative History and Physical. The patient is scheduled for a right knee resection arthroplasty, antibiotic spacer placement to be performed by Dr. Gus Rankin. Aluisio, MD at [facility] on 10/02/2011. The patient is a 71 year old female who presents today for follow up of their knee. The patient is being followed for their right infected total knee. Symptoms reported today include: pain, swelling and aching. The patient feels that they are doing well and report their pain level to be moderate. Current treatment includes: pain medications. The following medication has been used for pain control: Oxycodone (and Cipro). She did have staph aureus on that culture. Unfortunately her knee continues to worse. She has pain at all times. I have discussed on several occasions doing a resection arthroplasty and she was reluctant. She is now at a stage where she realizes that is the only thing that is going to get her better. That is the only thing that is going to get her better. She is ready to proceed with surgery.  Allergies  No Known Drug Allergies.   Medication History OxyCODONE HCl (5MG  Tablet, 1-2 Oral po q 6-8hrs prn pain, Taken starting 08/22/2011) Active. Quinapril-Hydrochlorothiazide (20-12.5MG  Tablet, Oral) Active. Cipro (500MG  Tablet, Oral) Active.  Past Surgical History  Breast Reconstruction. left Carpal Tunnel Repair. left Total Knee Replacement. 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 On Right  Leg Menopause  Family History  Heart Disease. mother Drug / Alcohol Addiction. mother Chronic Obstructive Lung Disease. mother Cancer. mother Heart disease in female family member before age 64 Heart disease in female family member before age 47 Hypertension. mother Diabetes Mellitus. mother  Social History  Non smoker / no tobacco use Alcohol use. never consumed alcohol Illicit drug use. no Tobacco use. never smoker Drug/Alcohol Rehab (Previously). no Number of flights of stairs before winded. less than 1 Pain Contract. no Tobacco / smoke exposure. yes outdoors only Marital status. married Children. 3 Tobacco use. Never smoker. Current work status. disabled Drug/Alcohol Rehab (Currently). no Non drinker / no alcohol Use Living situation. live with spouse Alcohol use. Never consumed alcohol. Exercise. Exercises weekly; does other  Review of Systems  General:Not Present- Chills, Fever, Night Sweats, Fatigue, Weight Gain, Weight Loss and Memory Loss. Skin:Not Present- Hives, Itching, Rash, Eczema and Lesions. HEENT:Not Present- Tinnitus, Headache, Double Vision, Visual Loss, Hearing Loss and Dentures. Respiratory:Present- Shortness of breath with exertion. Not Present- Shortness of breath at rest, Allergies, Coughing up blood and Chronic Cough. Cardiovascular:Not Present- Chest Pain, Racing/skipping heartbeats, Difficulty Breathing Lying Down, Murmur, Swelling and Palpitations. Gastrointestinal:Not Present- Bloody Stool, Heartburn, Abdominal Pain, Vomiting, Nausea, Constipation, Diarrhea, Difficulty Swallowing, Jaundice and Loss of appetitie. Female Genitourinary:Not Present- Blood in Urine, Urinary frequency, Weak urinary stream, Discharge, Flank Pain, Incontinence, Painful Urination, Urgency, Urinary Retention and Urinating at Night. Musculoskeletal:Present- Joint Swelling and Joint Pain. Not Present- Muscle Weakness, Muscle Pain, Back Pain, Morning  Stiffness and Spasms. Neurological:Not Present- Tremor, Dizziness, Blackout spells, Paralysis, Difficulty with balance and Weakness. Psychiatric:Not Present- Insomnia.  Vitals Weight: 185 lb Height: 65 in Body Surface Area: 1.96 m Body Mass Index: 30.79  kg/m Pulse: 72 (Regular) Resp.: 16 (Unlabored) BP: 134/76 (Sitting, Left Arm, Standard)   Physical Exam  The physical exam findings are as follows:   General Mental Status - Alert, cooperative and good historian. General Appearance- pleasant. Not in acute distress. Orientation- Oriented X3. Build & Nutrition- Well nourished and Well developed.   Head and Neck Head- normocephalic, atraumatic . Neck Global Assessment- bruit auscultated on the left (Faint, possibly referred from thoracic cavity.) and supple. no bruit auscultated on the right.   Eye Pupil- Bilateral- Regular and Round. Motion- Bilateral- EOMI.   Chest and Lung Exam Auscultation: Breath sounds:- clear at anterior chest wall and - clear at posterior chest wall. Adventitious sounds:- No Adventitious sounds.   Cardiovascular Auscultation:Rhythm- Regular rate and rhythm. Heart Sounds- S1 WNL and S2 WNL. Murmurs & Other Heart Sounds: Murmur 1:Location- Aortic Area and Pulmonic Area. Timing- Mid-systolic. Grade- III/VI. Character- Holosystolic.   Abdomen Palpation/Percussion:Tenderness- Abdomen is non-tender to palpation. Rigidity (guarding)- Abdomen is soft. Auscultation:Auscultation of the abdomen reveals - Bowel sounds normal.   Female Genitourinary Not done, not pertinent to present illness  Musculoskeletal She is alert and oriented in no apparent distress. Her right knee still shows an effusion. There is no warmth about the knee. There is no skin breakdown. Her range is about 5 to 100.  Assessment & Plan Status post revision of Right total knee replacement (V43.65) Infection of total knee replacement  (996.66)  Patient is for a Resection Arthroplasty of the Right Total Knee and Placement of Antibiotic Cement Spacer.  Avel Peace, PA-C

## 2011-10-01 MED ORDER — BUPIVACAINE 0.25 % ON-Q PUMP SINGLE CATH 300ML
300.0000 mL | INJECTION | Status: DC
  Filled 2011-10-01: qty 300

## 2011-10-02 ENCOUNTER — Encounter (HOSPITAL_COMMUNITY)
Admission: RE | Disposition: A | Discharge status: Home Health | Payer: Self-pay | Source: Ambulatory Visit | Attending: Orthopedic Surgery

## 2011-10-02 ENCOUNTER — Encounter (HOSPITAL_COMMUNITY): Payer: Self-pay | Admitting: Anesthesiology

## 2011-10-02 ENCOUNTER — Ambulatory Visit (HOSPITAL_COMMUNITY): Payer: Medicare Other | Admitting: Anesthesiology

## 2011-10-02 ENCOUNTER — Inpatient Hospital Stay (HOSPITAL_COMMUNITY)
Admission: RE | Admit: 2011-10-02 | Discharge: 2011-10-07 | DRG: 464 | Disposition: A | Discharge status: Home Health | Payer: Medicare Other | Source: Ambulatory Visit | Attending: Orthopedic Surgery | Admitting: Orthopedic Surgery

## 2011-10-02 ENCOUNTER — Encounter (HOSPITAL_COMMUNITY): Payer: Self-pay | Admitting: *Deleted

## 2011-10-02 DIAGNOSIS — T8450XA Infection and inflammatory reaction due to unspecified internal joint prosthesis, initial encounter: Principal | ICD-10-CM | POA: Diagnosis present

## 2011-10-02 DIAGNOSIS — Y831 Surgical operation with implant of artificial internal device as the cause of abnormal reaction of the patient, or of later complication, without mention of misadventure at the time of the procedure: Secondary | ICD-10-CM | POA: Diagnosis not present

## 2011-10-02 DIAGNOSIS — M009 Pyogenic arthritis, unspecified: Secondary | ICD-10-CM | POA: Diagnosis present

## 2011-10-02 DIAGNOSIS — J449 Chronic obstructive pulmonary disease, unspecified: Secondary | ICD-10-CM | POA: Diagnosis not present

## 2011-10-02 DIAGNOSIS — J4489 Other specified chronic obstructive pulmonary disease: Secondary | ICD-10-CM | POA: Diagnosis not present

## 2011-10-02 DIAGNOSIS — E119 Type 2 diabetes mellitus without complications: Secondary | ICD-10-CM | POA: Diagnosis not present

## 2011-10-02 DIAGNOSIS — D62 Acute posthemorrhagic anemia: Secondary | ICD-10-CM | POA: Diagnosis not present

## 2011-10-02 DIAGNOSIS — I10 Essential (primary) hypertension: Secondary | ICD-10-CM | POA: Diagnosis present

## 2011-10-02 DIAGNOSIS — B958 Unspecified staphylococcus as the cause of diseases classified elsewhere: Secondary | ICD-10-CM | POA: Diagnosis not present

## 2011-10-02 DIAGNOSIS — Z89529 Acquired absence of unspecified knee: Secondary | ICD-10-CM

## 2011-10-02 DIAGNOSIS — Z96659 Presence of unspecified artificial knee joint: Secondary | ICD-10-CM | POA: Diagnosis not present

## 2011-10-02 DIAGNOSIS — G5600 Carpal tunnel syndrome, unspecified upper limb: Secondary | ICD-10-CM | POA: Diagnosis not present

## 2011-10-02 DIAGNOSIS — Z4789 Encounter for other orthopedic aftercare: Secondary | ICD-10-CM | POA: Diagnosis not present

## 2011-10-02 DIAGNOSIS — Z452 Encounter for adjustment and management of vascular access device: Secondary | ICD-10-CM | POA: Diagnosis not present

## 2011-10-02 DIAGNOSIS — Z9289 Personal history of other medical treatment: Secondary | ICD-10-CM

## 2011-10-02 DIAGNOSIS — M869 Osteomyelitis, unspecified: Secondary | ICD-10-CM | POA: Diagnosis not present

## 2011-10-02 DIAGNOSIS — Z01812 Encounter for preprocedural laboratory examination: Secondary | ICD-10-CM

## 2011-10-02 LAB — GLUCOSE, CAPILLARY: Glucose-Capillary: 92 mg/dL (ref 70–99)

## 2011-10-02 LAB — GRAM STAIN

## 2011-10-02 SURGERY — REPLACEMENT, CEMENT SPACER, KNEE
Anesthesia: General | Site: Knee | Laterality: Right | Wound class: Dirty or Infected

## 2011-10-02 MED ORDER — MIDAZOLAM HCL 5 MG/5ML IJ SOLN
INTRAMUSCULAR | Status: DC | PRN
  Administered 2011-10-02: 2 mg via INTRAVENOUS

## 2011-10-02 MED ORDER — TOBRAMYCIN SULFATE 1.2 G IJ SOLR
INTRAMUSCULAR | Status: AC
  Filled 2011-10-02: qty 3.6

## 2011-10-02 MED ORDER — PHENOL 1.4 % MT LIQD: 1.0000 | OROMUCOSAL | Status: DC | PRN

## 2011-10-02 MED ORDER — HYDROMORPHONE HCL PF 1 MG/ML IJ SOLN
0.2500 mg | INTRAMUSCULAR | Status: DC | PRN
  Administered 2011-10-02 (×4): 0.5 mg via INTRAVENOUS

## 2011-10-02 MED ORDER — TRIAMCINOLONE ACETONIDE 0.1 % EX CREA
1.0000 "application " | TOPICAL_CREAM | Freq: Two times a day (BID) | CUTANEOUS | Status: DC | PRN
  Administered 2011-10-03: 1 via TOPICAL
  Filled 2011-10-02: qty 15

## 2011-10-02 MED ORDER — MORPHINE SULFATE 2 MG/ML IJ SOLN: 1.0000 mg | INTRAMUSCULAR | Status: DC | PRN

## 2011-10-02 MED ORDER — LABETALOL HCL 5 MG/ML IV SOLN
INTRAVENOUS | Status: DC | PRN
  Administered 2011-10-02: 5 mg via INTRAVENOUS

## 2011-10-02 MED ORDER — ONDANSETRON HCL 4 MG/2ML IJ SOLN
INTRAMUSCULAR | Status: DC | PRN
  Administered 2011-10-02: 4 mg via INTRAVENOUS

## 2011-10-02 MED ORDER — CEFAZOLIN SODIUM-DEXTROSE 2-3 GM-% IV SOLR
2.0000 g | INTRAVENOUS | Status: AC
  Administered 2011-10-02: 2 g via INTRAVENOUS

## 2011-10-02 MED ORDER — WARFARIN - PHARMACIST DOSING INPATIENT: Freq: Every day | Status: DC

## 2011-10-02 MED ORDER — CHLORHEXIDINE GLUCONATE 4 % EX LIQD: 60.0000 mL | Freq: Once | CUTANEOUS | Status: DC

## 2011-10-02 MED ORDER — ACETAMINOPHEN 650 MG RE SUPP: 650.0000 mg | Freq: Four times a day (QID) | RECTAL | Status: DC | PRN

## 2011-10-02 MED ORDER — ONDANSETRON HCL 4 MG PO TABS: 4.0000 mg | ORAL_TABLET | Freq: Four times a day (QID) | ORAL | Status: DC | PRN

## 2011-10-02 MED ORDER — LACTATED RINGERS IV SOLN
INTRAVENOUS | Status: DC | PRN
  Administered 2011-10-02 (×2): via INTRAVENOUS

## 2011-10-02 MED ORDER — CEFAZOLIN SODIUM-DEXTROSE 2-3 GM-% IV SOLR
INTRAVENOUS | Status: AC
  Filled 2011-10-02: qty 50

## 2011-10-02 MED ORDER — SODIUM CHLORIDE 0.9 % IR SOLN
Status: DC | PRN
  Administered 2011-10-02: 6000 mL

## 2011-10-02 MED ORDER — MENTHOL 3 MG MT LOZG: 1.0000 | LOZENGE | OROMUCOSAL | Status: DC | PRN

## 2011-10-02 MED ORDER — ACETAMINOPHEN 325 MG PO TABS: 650.0000 mg | ORAL_TABLET | Freq: Four times a day (QID) | ORAL | Status: DC | PRN

## 2011-10-02 MED ORDER — FENTANYL CITRATE 0.05 MG/ML IJ SOLN
INTRAMUSCULAR | Status: DC | PRN
  Administered 2011-10-02 (×7): 50 ug via INTRAVENOUS
  Administered 2011-10-02: 100 ug via INTRAVENOUS

## 2011-10-02 MED ORDER — HYDROMORPHONE HCL PF 1 MG/ML IJ SOLN
INTRAMUSCULAR | Status: AC
  Filled 2011-10-02: qty 1

## 2011-10-02 MED ORDER — BUPIVACAINE HCL (PF) 0.25 % IJ SOLN
INTRAMUSCULAR | Status: DC | PRN
  Administered 2011-10-02: 18:00:00

## 2011-10-02 MED ORDER — BUPIVACAINE ON-Q PAIN PUMP (FOR ORDER SET NO CHG)
INJECTION | Status: DC
  Filled 2011-10-02: qty 1

## 2011-10-02 MED ORDER — POTASSIUM CHLORIDE IN NACL 20-0.9 MEQ/L-% IV SOLN
INTRAVENOUS | Status: DC
  Administered 2011-10-03 – 2011-10-06 (×5): via INTRAVENOUS
  Filled 2011-10-02 (×6): qty 1000

## 2011-10-02 MED ORDER — WARFARIN SODIUM 5 MG PO TABS
5.0000 mg | ORAL_TABLET | Freq: Once | ORAL | Status: AC
  Administered 2011-10-02: 5 mg via ORAL
  Filled 2011-10-02: qty 1

## 2011-10-02 MED ORDER — BISACODYL 10 MG RE SUPP: 10.0000 mg | Freq: Every day | RECTAL | Status: DC | PRN

## 2011-10-02 MED ORDER — PATIENT'S GUIDE TO USING COUMADIN BOOK
Freq: Once | Status: AC
  Administered 2011-10-02: 22:00:00
  Filled 2011-10-02: qty 1

## 2011-10-02 MED ORDER — CEFAZOLIN SODIUM 1-5 GM-% IV SOLN
1.0000 g | Freq: Four times a day (QID) | INTRAVENOUS | Status: AC
  Administered 2011-10-02 – 2011-10-03 (×3): 1 g via INTRAVENOUS
  Filled 2011-10-02 (×3): qty 50

## 2011-10-02 MED ORDER — PROPOFOL 10 MG/ML IV BOLUS
INTRAVENOUS | Status: DC | PRN
  Administered 2011-10-02: 120 mg via INTRAVENOUS

## 2011-10-02 MED ORDER — LABETALOL HCL 300 MG PO TABS
300.0000 mg | ORAL_TABLET | Freq: Two times a day (BID) | ORAL | Status: DC
  Administered 2011-10-03 – 2011-10-07 (×9): 300 mg via ORAL
  Filled 2011-10-02 (×11): qty 1

## 2011-10-02 MED ORDER — ACETAMINOPHEN 10 MG/ML IV SOLN
INTRAVENOUS | Status: AC
  Filled 2011-10-02: qty 100

## 2011-10-02 MED ORDER — SODIUM CHLORIDE 0.9 % IV SOLN: INTRAVENOUS | Status: DC

## 2011-10-02 MED ORDER — METHOCARBAMOL 500 MG PO TABS
500.0000 mg | ORAL_TABLET | Freq: Four times a day (QID) | ORAL | Status: DC | PRN
  Administered 2011-10-03 – 2011-10-07 (×10): 500 mg via ORAL
  Filled 2011-10-02 (×10): qty 1

## 2011-10-02 MED ORDER — PROMETHAZINE HCL 25 MG/ML IJ SOLN: 6.2500 mg | INTRAMUSCULAR | Status: DC | PRN

## 2011-10-02 MED ORDER — METOCLOPRAMIDE HCL 10 MG PO TABS: 5.0000 mg | ORAL_TABLET | Freq: Three times a day (TID) | ORAL | Status: DC | PRN

## 2011-10-02 MED ORDER — OXYCODONE HCL 5 MG PO TABS
5.0000 mg | ORAL_TABLET | ORAL | Status: DC | PRN
  Administered 2011-10-02 – 2011-10-07 (×18): 10 mg via ORAL
  Filled 2011-10-02 (×18): qty 2

## 2011-10-02 MED ORDER — METHOCARBAMOL 100 MG/ML IJ SOLN
500.0000 mg | Freq: Four times a day (QID) | INTRAMUSCULAR | Status: DC | PRN
  Administered 2011-10-02: 500 mg via INTRAVENOUS
  Filled 2011-10-02 (×3): qty 5

## 2011-10-02 MED ORDER — FLEET ENEMA 7-19 GM/118ML RE ENEM: 1.0000 | ENEMA | Freq: Once | RECTAL | Status: AC | PRN

## 2011-10-02 MED ORDER — ONDANSETRON HCL 4 MG/2ML IJ SOLN: 4.0000 mg | Freq: Four times a day (QID) | INTRAMUSCULAR | Status: DC | PRN

## 2011-10-02 MED ORDER — WARFARIN VIDEO: Freq: Once | Status: DC

## 2011-10-02 MED ORDER — DEXAMETHASONE SODIUM PHOSPHATE 10 MG/ML IJ SOLN
10.0000 mg | Freq: Once | INTRAMUSCULAR | Status: DC
  Filled 2011-10-02: qty 1

## 2011-10-02 MED ORDER — ASPIRIN 325 MG PO TABS
325.0000 mg | ORAL_TABLET | Freq: Every morning | ORAL | Status: DC
  Administered 2011-10-03 – 2011-10-07 (×5): 325 mg via ORAL
  Filled 2011-10-02 (×5): qty 1

## 2011-10-02 MED ORDER — DOCUSATE SODIUM 100 MG PO CAPS
100.0000 mg | ORAL_CAPSULE | Freq: Two times a day (BID) | ORAL | Status: DC
  Administered 2011-10-02 – 2011-10-07 (×10): 100 mg via ORAL
  Filled 2011-10-02 (×11): qty 1

## 2011-10-02 MED ORDER — LACTATED RINGERS IV SOLN: INTRAVENOUS | Status: DC

## 2011-10-02 MED ORDER — ACETAMINOPHEN 10 MG/ML IV SOLN
1000.0000 mg | Freq: Four times a day (QID) | INTRAVENOUS | Status: AC
  Administered 2011-10-02 – 2011-10-03 (×3): 1000 mg via INTRAVENOUS
  Filled 2011-10-02 (×4): qty 100

## 2011-10-02 MED ORDER — DIPHENHYDRAMINE HCL 12.5 MG/5ML PO ELIX: 12.5000 mg | ORAL_SOLUTION | ORAL | Status: DC | PRN

## 2011-10-02 MED ORDER — VANCOMYCIN HCL 1000 MG IV SOLR
INTRAVENOUS | Status: AC
  Filled 2011-10-02: qty 3000

## 2011-10-02 MED ORDER — SUCCINYLCHOLINE CHLORIDE 20 MG/ML IJ SOLN
INTRAMUSCULAR | Status: DC | PRN
  Administered 2011-10-02: 100 mg via INTRAVENOUS

## 2011-10-02 MED ORDER — TEMAZEPAM 15 MG PO CAPS: 15.0000 mg | ORAL_CAPSULE | Freq: Every evening | ORAL | Status: DC | PRN

## 2011-10-02 MED ORDER — ACETAMINOPHEN 10 MG/ML IV SOLN
1000.0000 mg | Freq: Once | INTRAVENOUS | Status: AC
  Administered 2011-10-02: 1000 mg via INTRAVENOUS
  Filled 2011-10-02: qty 100

## 2011-10-02 MED ORDER — POLYETHYLENE GLYCOL 3350 17 G PO PACK
17.0000 g | PACK | Freq: Every day | ORAL | Status: DC | PRN
  Filled 2011-10-02: qty 1

## 2011-10-02 MED ORDER — METOCLOPRAMIDE HCL 5 MG/ML IJ SOLN: 5.0000 mg | Freq: Three times a day (TID) | INTRAMUSCULAR | Status: DC | PRN

## 2011-10-02 SURGICAL SUPPLY — 53 items
BAG ZIPLOCK 12X15 (MISCELLANEOUS) ×2 IMPLANT
BANDAGE ELASTIC 6 VELCRO ST LF (GAUZE/BANDAGES/DRESSINGS) ×2 IMPLANT
BANDAGE ESMARK 6X9 LF (GAUZE/BANDAGES/DRESSINGS) ×1 IMPLANT
BIT DRILL 2.8X128 (BIT) ×2 IMPLANT
BLADE SAG 18X100X1.27 (BLADE) ×2 IMPLANT
BNDG ESMARK 6X9 LF (GAUZE/BANDAGES/DRESSINGS) ×2
BONE CEMENT GENTAMICIN (Cement) ×8 IMPLANT
BOWL SMART MIX CTS (DISPOSABLE) ×4 IMPLANT
CATH KIT ON Q 5IN SLV (PAIN MANAGEMENT) ×2 IMPLANT
CLOTH BEACON ORANGE TIMEOUT ST (SAFETY) ×2 IMPLANT
CUFF TOURN SGL QUICK 34 (TOURNIQUET CUFF) ×1
CUFF TRNQT CYL 34X4X40X1 (TOURNIQUET CUFF) ×1 IMPLANT
DRAPE EXTREMITY T 121X128X90 (DRAPE) ×2 IMPLANT
DRAPE POUCH INSTRU U-SHP 10X18 (DRAPES) ×2 IMPLANT
DRAPE U-SHAPE 47X51 STRL (DRAPES) ×2 IMPLANT
DRSG ADAPTIC 3X8 NADH LF (GAUZE/BANDAGES/DRESSINGS) ×2 IMPLANT
DRSG PAD ABDOMINAL 8X10 ST (GAUZE/BANDAGES/DRESSINGS) ×2 IMPLANT
DURAPREP 26ML APPLICATOR (WOUND CARE) ×2 IMPLANT
ELECT REM PT RETURN 9FT ADLT (ELECTROSURGICAL) ×2
ELECTRODE REM PT RTRN 9FT ADLT (ELECTROSURGICAL) ×1 IMPLANT
EVACUATOR 1/8 PVC DRAIN (DRAIN) ×2 IMPLANT
FACESHIELD LNG OPTICON STERILE (SAFETY) ×10 IMPLANT
GLOVE BIO SURGEON STRL SZ7.5 (GLOVE) ×2 IMPLANT
GLOVE BIO SURGEON STRL SZ8 (GLOVE) ×2 IMPLANT
GLOVE BIOGEL PI IND STRL 8 (GLOVE) ×2 IMPLANT
GLOVE BIOGEL PI INDICATOR 8 (GLOVE) ×2
GOWN STRL NON-REIN LRG LVL3 (GOWN DISPOSABLE) ×2 IMPLANT
GOWN STRL REIN XL XLG (GOWN DISPOSABLE) ×2 IMPLANT
HANDPIECE INTERPULSE COAX TIP (DISPOSABLE) ×1
IMMOBILIZER KNEE 20 (SOFTGOODS) ×2
IMMOBILIZER KNEE 20 THIGH 36 (SOFTGOODS) ×1 IMPLANT
INSERT PFC SIG STB SZ3 15.0MM (Knees) ×2 IMPLANT
KIT BASIN OR (CUSTOM PROCEDURE TRAY) ×2 IMPLANT
MANIFOLD NEPTUNE II (INSTRUMENTS) ×2 IMPLANT
NS IRRIG 1000ML POUR BTL (IV SOLUTION) ×2 IMPLANT
PACK TOTAL JOINT (CUSTOM PROCEDURE TRAY) ×2 IMPLANT
PAD ABD 7.5X8 STRL (GAUZE/BANDAGES/DRESSINGS) ×2 IMPLANT
PADDING CAST ABS 6INX4YD NS (CAST SUPPLIES) ×1
PADDING CAST ABS COTTON 6X4 NS (CAST SUPPLIES) ×1 IMPLANT
PADDING CAST COTTON 6X4 STRL (CAST SUPPLIES) ×6 IMPLANT
POSITIONER SURGICAL ARM (MISCELLANEOUS) ×2 IMPLANT
SET HNDPC FAN SPRY TIP SCT (DISPOSABLE) ×1 IMPLANT
SPONGE GAUZE 4X4 12PLY (GAUZE/BANDAGES/DRESSINGS) ×2 IMPLANT
STAPLER VISISTAT 35W (STAPLE) ×2 IMPLANT
SUT PDS AB 1 CT1 27 (SUTURE) ×6 IMPLANT
SUT VIC AB 2-0 CT1 27 (SUTURE) ×3
SUT VIC AB 2-0 CT1 TAPERPNT 27 (SUTURE) ×3 IMPLANT
SUT VLOC 180 0 24IN GS25 (SUTURE) ×2 IMPLANT
TOWEL OR 17X26 10 PK STRL BLUE (TOWEL DISPOSABLE) ×4 IMPLANT
TRAY FOLEY CATH 14FRSI W/METER (CATHETERS) ×2 IMPLANT
TUBE ANAEROBIC SPECIMEN COL (MISCELLANEOUS) ×2 IMPLANT
WATER STERILE IRR 1500ML POUR (IV SOLUTION) ×2 IMPLANT
WRAP KNEE MAXI GEL POST OP (GAUZE/BANDAGES/DRESSINGS) ×4 IMPLANT

## 2011-10-02 NOTE — Anesthesia Postprocedure Evaluation (Signed)
  Anesthesia Post-op Note  Patient: [name]  Procedure(s) Performed: Procedure(s) (LRB): REIMPLANTATION OF CEMENTED SPACER KNEE (Right)  Patient Location: PACU  Anesthesia Type: General  Level of Consciousness: awake and alert   Airway and Oxygen Therapy: Patient Spontanous Breathing  Post-op Pain: mild  Post-op Assessment: Post-op Vital signs reviewed, Patient's Cardiovascular Status Stable, Respiratory Function Stable, Patent Airway and No signs of Nausea or vomiting  Post-op Vital Signs: stable  Complications: No apparent anesthesia complications

## 2011-10-02 NOTE — Progress Notes (Signed)
ANTICOAGULATION CONSULT NOTE - Initial Consult  Pharmacy Consult for Coumadin Indication: Post-op VTE prophylaxis  No Known Allergies  Patient Measurements: Ht. 65.5 inches Wt. 83.9 kg  Labs: 09/27/11  PT 14.2  INR 1.08  aPTT 37  Hgb 9.7  Hct 31.1 Platelets 325  Medical History: Past Medical History  Diagnosis Date  . DJD (degenerative joint disease)     generalized  . Dyslipidemia   . Diabetes mellitus     borderline  . Squamous cell carcinoma   . Osteoarthritis   . HTN (hypertension)   . Spinal stenosis of lumbar region 04/17/2011  . Dyspnea 09-27-11    with extreme exertion.  Marland Kitchen COPD (chronic obstructive pulmonary disease) 09-27-11    mild  . Rotator cuff syndrome 09-27-11    rotator cuff pain sometimes  . Carpal tunnel syndrome, right 09-27-11    right hand  . 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 Gallstones 09-27-11    hx. not bothered    Medications:  Prescriptions prior to admission  Medication Sig Dispense Refill  . labetalol (NORMODYNE) 300 MG tablet Take 300 mg by mouth 2 (two) times daily.        Marland Kitchen oxycodone (OXY-IR) 5 MG capsule Take 5-10 mg by mouth every 4 (four) hours as needed. Pain      . quinapril-hydrochlorothiazide (ACCURETIC) 20-12.5 MG per tablet Take 1 tablet by mouth every morning.       . triamcinolone cream (KENALOG) 0.1 % Apply 1 application topically 2 (two) times daily as needed. Rash      . aspirin 325 MG tablet Take 325 mg by mouth every morning.      . Calcium-Vitamin D (CALTRATE 600 PLUS-VIT D PO) Take 1 tablet by mouth daily.      . cholecalciferol (VITAMIN D) 1000 UNITS tablet Take 1,000 Units by mouth daily.      . Ferrous Sulfate (IRON) 325 (65 FE) MG TABS Take 65 mg by mouth 2 (two) times a week.      . fish oil-omega-3 fatty acids 1000 MG capsule Take 1 g by mouth daily.      . Garlic (GARLIQUE PO) Take 1 tablet by mouth daily.      Marland Kitchen lactobacillus acidophilus (BACID) TABS Take 1 tablet by mouth 2 (two) times daily.      .  meloxicam (MOBIC) 15 MG tablet Take 15 mg by mouth daily.      . Multiple Vitamin (MULITIVITAMIN WITH MINERALS) TABS Take 1 tablet by mouth daily.      . naproxen sodium (ANAPROX) 220 MG tablet Take 440 mg by mouth 2 (two) times daily as needed. Pain      . POTASSIUM PO Take 1 tablet by mouth See admin instructions. Pt buys the over the counter and takes it daily        Assessment: Asked to assist with Coumadin therapy for 71 year-old WF following surgery today for reimplantation of cemented spacer due to infected right total knee arthroplasty.  Goal of Therapy:  INR 2-3   Plan:   Coumadin 5 mg today  PT/INR daily  Review of Coumadin education  [employer] R.Ph. 10/02/2011,9:28 PM

## 2011-10-02 NOTE — Brief Op Note (Signed)
10/02/2011  5:58 PM  PATIENT:  [name]  71 y.o. female  PRE-OPERATIVE DIAGNOSIS:  infected right total knee   POST-OPERATIVE DIAGNOSIS:  infected right total knee  PROCEDURE:  Procedure(s) (LRB): REIMPLANTATION OF CEMENTED SPACER KNEE (Right)  SURGEON:  Surgeon(s) and Role:    Loanne Drilling, MD - Primary  PHYSICIAN ASSISTANT:   ASSISTANTS: Avel Peace, PA-C   ANESTHESIA:   general  EBL:  Total I/O In: 1000 [I.V.:1000] Out: 100 [Urine:100]  BLOOD ADMINISTERED:none  DRAINS: (Medium) Hemovact drain(s) in the right knee with  Suction Open   LOCAL MEDICATIONS USED:  OTHER On-Q Marcaine pain pump  TOURNIQUET:   Total Tourniquet Time Documented: Thigh (Right) - 69 minutes  DICTATION: .Other Dictation: Dictation Number [id number]  PLAN OF CARE: Admit for overnight observation  PATIENT DISPOSITION:  PACU - hemodynamically stable.   Gus Rankin Alethea Terhaar, MD    10/02/2011, 6:03 PM

## 2011-10-02 NOTE — Interval H&P Note (Signed)
History and Physical Interval Note:  10/02/2011 4:09 PM  [name]  has presented today for surgery, with the diagnosis of infected right total knee   The various methods of treatment have been discussed with the patient and family. After consideration of risks, benefits and other options for treatment, the patient has consented to  Procedure(s) (LRB): REIMPLANTATION OF CEMENTED SPACER KNEE (Right) as a surgical intervention .  The patients' history has been reviewed, patient examined, no change in status, stable for surgery.  I have reviewed the patients' chart and labs.  Questions were answered to the patient's satisfaction.     Loanne Drilling

## 2011-10-02 NOTE — Transfer of Care (Signed)
Immediate Anesthesia Transfer of Care Note  Patient: [name]  Procedure(s) Performed: Procedure(s) (LRB): REIMPLANTATION OF CEMENTED SPACER KNEE (Right)  Patient Location: PACU  Anesthesia Type: General  Level of Consciousness: sedated, patient cooperative and responds to stimulaton  Airway & Oxygen Therapy: Patient Spontanous Breathing and Patient connected to face mask oxgen  Post-op Assessment: Report given to PACU RN and Post -op Vital signs reviewed and stable  Post vital signs: Reviewed and stable  Complications: No apparent anesthesia complications

## 2011-10-02 NOTE — H&P (View-Only) (Signed)
[name]  DOB: 08/26/1940 Married / Language: English / Race: White Female  Date of Admission:  10/02/2011  Chief Complaint:  Infected Right Total Knee Arthroplasty  History of Present Illness The patient is a 71 year old female who comes in for a preoperative History and Physical. The patient is scheduled for a right knee resection arthroplasty, antibiotic spacer placement to be performed by Dr. Frank V. Aluisio, MD at [facility] on 10/02/2011. The patient is a 71 year old female who presents today for follow up of their knee. The patient is being followed for their right infected total knee. Symptoms reported today include: pain, swelling and aching. The patient feels that they are doing well and report their pain level to be moderate. Current treatment includes: pain medications. The following medication has been used for pain control: Oxycodone (and Cipro). She did have staph aureus on that culture. Unfortunately her knee continues to worse. She has pain at all times. I have discussed on several occasions doing a resection arthroplasty and she was reluctant. She is now at a stage where she realizes that is the only thing that is going to get her better. That is the only thing that is going to get her better. She is ready to proceed with surgery.  Allergies  No Known Drug Allergies.   Medication History OxyCODONE HCl (5MG Tablet, 1-2 Oral po q 6-8hrs prn pain, Taken starting 08/22/2011) Active. Quinapril-Hydrochlorothiazide (20-12.5MG Tablet, Oral) Active. Cipro (500MG Tablet, Oral) Active.  Past Surgical History  Breast Reconstruction. left Carpal Tunnel Repair. left Total Knee Replacement. 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 On Right  Leg Menopause  Family History  Heart Disease. mother Drug / Alcohol Addiction. mother Chronic Obstructive Lung Disease. mother Cancer. mother Heart disease in female family member before age 65 Heart disease in female family member before age 55 Hypertension. mother Diabetes Mellitus. mother  Social History  Non smoker / no tobacco use Alcohol use. never consumed alcohol Illicit drug use. no Tobacco use. never smoker Drug/Alcohol Rehab (Previously). no Number of flights of stairs before winded. less than 1 Pain Contract. no Tobacco / smoke exposure. yes outdoors only Marital status. married Children. 3 Tobacco use. Never smoker. Current work status. disabled Drug/Alcohol Rehab (Currently). no Non drinker / no alcohol Use Living situation. live with spouse Alcohol use. Never consumed alcohol. Exercise. Exercises weekly; does other  Review of Systems  General:Not Present- Chills, Fever, Night Sweats, Fatigue, Weight Gain, Weight Loss and Memory Loss. Skin:Not Present- Hives, Itching, Rash, Eczema and Lesions. HEENT:Not Present- Tinnitus, Headache, Double Vision, Visual Loss, Hearing Loss and Dentures. Respiratory:Present- Shortness of breath with exertion. Not Present- Shortness of breath at rest, Allergies, Coughing up blood and Chronic Cough. Cardiovascular:Not Present- Chest Pain, Racing/skipping heartbeats, Difficulty Breathing Lying Down, Murmur, Swelling and Palpitations. Gastrointestinal:Not Present- Bloody Stool, Heartburn, Abdominal Pain, Vomiting, Nausea, Constipation, Diarrhea, Difficulty Swallowing, Jaundice and Loss of appetitie. Female Genitourinary:Not Present- Blood in Urine, Urinary frequency, Weak urinary stream, Discharge, Flank Pain, Incontinence, Painful Urination, Urgency, Urinary Retention and Urinating at Night. Musculoskeletal:Present- Joint Swelling and Joint Pain. Not Present- Muscle Weakness, Muscle Pain, Back Pain, Morning  Stiffness and Spasms. Neurological:Not Present- Tremor, Dizziness, Blackout spells, Paralysis, Difficulty with balance and Weakness. Psychiatric:Not Present- Insomnia.  Vitals Weight: 185 lb Height: 65 in Body Surface Area: 1.96 m Body Mass Index: 30.79   kg/m Pulse: 72 (Regular) Resp.: 16 (Unlabored) BP: 134/76 (Sitting, Left Arm, Standard)   Physical Exam  The physical exam findings are as follows:   General Mental Status - Alert, cooperative and good historian. General Appearance- pleasant. Not in acute distress. Orientation- Oriented X3. Build & Nutrition- Well nourished and Well developed.   Head and Neck Head- normocephalic, atraumatic . Neck Global Assessment- bruit auscultated on the left (Faint, possibly referred from thoracic cavity.) and supple. no bruit auscultated on the right.   Eye Pupil- Bilateral- Regular and Round. Motion- Bilateral- EOMI.   Chest and Lung Exam Auscultation: Breath sounds:- clear at anterior chest wall and - clear at posterior chest wall. Adventitious sounds:- No Adventitious sounds.   Cardiovascular Auscultation:Rhythm- Regular rate and rhythm. Heart Sounds- S1 WNL and S2 WNL. Murmurs & Other Heart Sounds: Murmur 1:Location- Aortic Area and Pulmonic Area. Timing- Mid-systolic. Grade- III/VI. Character- Holosystolic.   Abdomen Palpation/Percussion:Tenderness- Abdomen is non-tender to palpation. Rigidity (guarding)- Abdomen is soft. Auscultation:Auscultation of the abdomen reveals - Bowel sounds normal.   Female Genitourinary Not done, not pertinent to present illness  Musculoskeletal She is alert and oriented in no apparent distress. Her right knee still shows an effusion. There is no warmth about the knee. There is no skin breakdown. Her range is about 5 to 100.  Assessment & Plan Status post revision of Right total knee replacement (V43.65) Infection of total knee replacement  (996.66)  Patient is for a Resection Arthroplasty of the Right Total Knee and Placement of Antibiotic Cement Spacer.  Drew Sheree Lalla, PA-C  

## 2011-10-02 NOTE — Anesthesia Preprocedure Evaluation (Signed)
Anesthesia Evaluation  Patient identified by MRN, date of birth, ID band Patient awake    Reviewed: Allergy & Precautions, H&P , NPO status , Patient's Chart, lab work & pertinent test results  Airway Mallampati: II TM Distance: >3 FB Neck ROM: Full    Dental No notable dental hx.    Pulmonary shortness of breath, COPD breath sounds clear to auscultation  Pulmonary exam normal       Cardiovascular hypertension, Pt. on home beta blockers and Pt. on medications Rhythm:Regular Rate:Normal     Neuro/Psych  Neuromuscular disease negative psych ROS   GI/Hepatic negative GI ROS, Neg liver ROS,   Endo/Other  Diabetes mellitus-Diet-controlled diabetes.  Renal/GU negative Renal ROS  negative genitourinary   Musculoskeletal negative musculoskeletal ROS (+)   Abdominal   Peds negative pediatric ROS (+)  Hematology negative hematology ROS (+)   Anesthesia Other Findings   Reproductive/Obstetrics negative OB ROS                           Anesthesia Physical Anesthesia Plan  ASA: III  Anesthesia Plan: General   Post-op Pain Management:    Induction: Intravenous  Airway Management Planned: Oral ETT  Additional Equipment:   Intra-op Plan:   Post-operative Plan: Extubation in OR  Informed Consent: I have reviewed the patients History and Physical, chart, labs and discussed the procedure including the risks, benefits and alternatives for the proposed anesthesia with the patient or authorized representative who has indicated his/her understanding and acceptance.   Dental advisory given  Plan Discussed with: CRNA  Anesthesia Plan Comments:         Anesthesia Quick Evaluation

## 2011-10-03 DIAGNOSIS — Z89529 Acquired absence of unspecified knee: Secondary | ICD-10-CM | POA: Diagnosis not present

## 2011-10-03 DIAGNOSIS — Z9289 Personal history of other medical treatment: Secondary | ICD-10-CM

## 2011-10-03 LAB — BASIC METABOLIC PANEL
BUN: 15 mg/dL (ref 6–23)
CO2: 30 mEq/L (ref 19–32)
Calcium: 8.7 mg/dL (ref 8.4–10.5)
GFR calc non Af Amer: 60 mL/min — ABNORMAL LOW (ref >90)
Glucose, Bld: 119 mg/dL — ABNORMAL HIGH (ref 70–99)

## 2011-10-03 LAB — CBC
HCT: 25.6 % — ABNORMAL LOW (ref 36.0–46.0)
Hemoglobin: 8 g/dL — ABNORMAL LOW (ref 12.0–15.0)
MCH: 25 pg — ABNORMAL LOW (ref 26.0–34.0)
MCHC: 31.3 g/dL (ref 30.0–36.0)
MCV: 80 fL (ref 78.0–100.0)
RBC: 3.2 MIL/uL — ABNORMAL LOW (ref 3.87–5.11)

## 2011-10-03 LAB — PROTIME-INR: INR: 1.14 (ref 0.00–1.49)

## 2011-10-03 LAB — PREPARE RBC (CROSSMATCH)

## 2011-10-03 MED ORDER — CEFAZOLIN SODIUM 1-5 GM-% IV SOLN
1.0000 g | Freq: Three times a day (TID) | INTRAVENOUS | Status: DC
  Filled 2011-10-03: qty 50

## 2011-10-03 MED ORDER — FUROSEMIDE 10 MG/ML IJ SOLN
10.0000 mg | Freq: Once | INTRAMUSCULAR | Status: AC
  Administered 2011-10-03: 10 mg via INTRAVENOUS
  Filled 2011-10-03: qty 1

## 2011-10-03 MED ORDER — CEFAZOLIN SODIUM 1-5 GM-% IV SOLN
1.0000 g | Freq: Three times a day (TID) | INTRAVENOUS | Status: DC
  Administered 2011-10-03 – 2011-10-07 (×12): 1 g via INTRAVENOUS
  Filled 2011-10-03 (×14): qty 50

## 2011-10-03 MED ORDER — WARFARIN SODIUM 5 MG PO TABS
5.0000 mg | ORAL_TABLET | Freq: Once | ORAL | Status: AC
  Administered 2011-10-03: 5 mg via ORAL
  Filled 2011-10-03: qty 1

## 2011-10-03 MED ORDER — RIFAMPIN 300 MG PO CAPS
300.0000 mg | ORAL_CAPSULE | Freq: Every day | ORAL | Status: DC
  Administered 2011-10-03 – 2011-10-07 (×5): 300 mg via ORAL
  Filled 2011-10-03 (×5): qty 1

## 2011-10-03 MED ORDER — ACETAMINOPHEN 325 MG PO TABS: 650.0000 mg | ORAL_TABLET | Freq: Once | ORAL | Status: DC

## 2011-10-03 NOTE — Op Note (Signed)
NAMEAMANEE, IACOVELLI NO.:  [id number]  MEDICAL RECORD NO.:  [id number]  LOCATION:  1609                         FACILITY:  [facility]  PHYSICIAN:  Ollen Gross, M.D.    DATE OF BIRTH:  1940-11-11  DATE OF PROCEDURE:  10/02/2011 DATE OF DISCHARGE:                              OPERATIVE REPORT   PREOPERATIVE DIAGNOSIS:  Septic arthritis, right knee.  POSTOPERATIVE DIAGNOSIS:  Septic arthritis, right knee.  PROCEDURE:  Right knee resection, arthroplasty with antibiotic spacer placement.  ASSISTANT:  Alexzandrew L. Perkins, P.A.C.  ANESTHESIA:  General.  ESTIMATED BLOOD LOSS:  Minimal.  DRAINS:  Hemovac x1.  Two limbs, one in subcu and one in the joint.  TOURNIQUET TIME:  68 minutes at 200 mmHg.  COMPLICATIONS:  None.  CONDITION:  Stable to recovery.  BRIEF CLINICAL NOTE:  Ms. Mosso is a 71 year old female, long complex history regards to her right knee.  She has a septic arthritis with Staph species.  She is presenting now for irrigation, debridement, resection, arthroplasty and placement of antibiotic spacer.  PROCEDURE IN DETAIL:  After successful administration of general anesthetic, tourniquet was placed high on the right thigh.  Right lower extremity prepped and draped in usual sterile fashion.  Extremities wrapped in Esmarch, tourniquet inflated to 200 mmHg.  Previous medial parapatellar incisions were reutilized, skin cut with a 10 blade through subcutaneous tissue to the extensor mechanism.  Upon incising the extensor mechanism, gross pus came out of the joint.  Sent for stat Gram stain, C and S.  Medial arthrotomy was made.  Soft tissue on the proximal medial tibia subperiosteally elevated to the joint line with a knife and into the semimembranosus bursa with a Cobb elevator.  Soft tissue was elevated with attention being paid to avoid the patellar tendon on tibial tubercle.  Patella subluxed laterally.  Thorough synovectomy and scar excision  was performed to open up the medial and lateral gutters.  There were 2 pockets of pus, 1 in the medial gutter and 1 inferolaterally.  These were thoroughly debrided after the synovectomy tissue had a healthy appearance.  I then flexed the knee and subluxed the tibia forward to remove the tibial polyethylene.  The femoral component came off extremely easily with minimal need to disrupt the interface between the cement and bone.  There was no evidence of any additional bone loss when I removed the femoral component.  The tibia subluxed forward.  Retractors were placed.  I used osteotomes to disrupt the interface between the bone and prosthesis.  I was able to extract the MBT revision tray with the sleeve remained intact.  I circumferentially used small osteotomes to disrupt the interface between the sleeve and the cement, removing the sleeve and we subsequently removed the cement from the tibia.  We then curetted the tibial and femoral canals to remove any fibrinous tissue.  The wound was then copiously irrigated with 6 L of saline with pulsatile lavage.  Cement was mixed, which is 4 patches of gentamicin impregnated high viscosity cement.  We made an articulating spacer.  When the cement was ready to mold, I formed a femoral piece, which resembled a femoral component.  We then cemented in a 15 mm thick size 3 tibial insert.  The flexion and extension gaps were well balanced.  We then removed the patella using an oscillating saw.  Wound was further irrigated with the pulsatile lavage and saline.  When the cement fully hardened, then we closed the arthrotomy over Hemovac drain with V lock suture and then subcu over another limb of the  Hemovac drain with interrupted 2-0 Vicryl, skin closed with staples.  Tourniquet was released with total time of 68 minutes.  Incision was cleaned and dried and a bulky sterile dressing applied.  Drains hooked to suction and she was awakened and  transported to Recovery in stable condition.     Ollen Gross, M.D.     FA/MEDQ  D:  10/02/2011  T:  10/03/2011  Job:  [id number]

## 2011-10-03 NOTE — Progress Notes (Signed)
ANTICOAGULATION CONSULT NOTE - Follow Up Consult  Pharmacy Consult for Coumadin Indication: VTE prophylaxis  No Known Allergies  Patient Measurements: Height: 5' 0.5" (153.7 cm) Weight: 185 lb (83.915 kg) IBW/kg (Calculated) : 46.65   Vital Signs: Temp: 97.7 F (36.5 C) (04/25 0630) Temp src: Oral (04/25 0630) BP: 112/68 mmHg (04/25 1010) Pulse Rate: 72  (04/25 0630)  Labs:  Basename 10/03/11 0500  HGB 8.0*  HCT 25.6*  PLT 288  APTT --  LABPROT 14.8  INR 1.14  HEPARINUNFRC --  CREATININE 0.95  CKTOTAL --  CKMB --  TROPONINI --   Estimated Creatinine Clearance: 54.4 ml/min (by C-G formula based on Cr of 0.95).  Assessment:  47 YOF s/p reimplantation of cemented spacer for infected R TKA. Coumadin for VTE prophylaxis. Coumadin score = 5.  Patient has been on Coumadin before but unable to see doses/trend through Epic.  INR subtherapeutic as expected with Coumadin initiation.   No bleeding/complications reported but Hgb 8 today.   Notes stated that patient will need IV abx via PICC line as outpatient and considering ID consult for recommendation.  Patient with + staph aureus from aspirate in 06/2011 (not available in EPIC).  Patient completed 3 doses of Ancef post-op this AM.  Please consider adding staph coverage sooner rather than later.   Goal of Therapy:  INR 2-3   Plan:   Repeat Coumadin 5 mg po x 1 tonight  Watch CBC, coumadin education today.  MD:  Patient completed 3 doses of Ancef post-op this AM.  Please consider adding staph coverage sooner rather than later.  Geoffry Paradise Thi 10/03/2011,11:04 AM

## 2011-10-03 NOTE — Evaluation (Signed)
Physical Therapy Evaluation Patient Details Name: [name] MRN: [id number] DOB: 01/14/1941 Today's Date: 10/03/2011 Time: 0939-1000 PT Time Calculation (min): 21 min  PT Assessment / Plan / Recommendation Clinical Impression  Pt s/p resection of R total knee and implantation of cemented knee spacer.  Pt would benefit from acute PT services in order to improve independence with bed mobility, transfers, and ambulation to prepare for safe d/c home with spouse.    PT Assessment  Patient needs continued PT services    Follow Up Recommendations  Home health PT    Equipment Recommendations  None recommended by PT    Frequency 7X/week    Precautions / Restrictions Precautions Precautions: Knee Precaution Comments: NO MOTION OR BENDING OF R KNEE Required Braces or Orthoses: Knee Immobilizer - Right Knee Immobilizer - Right: On at all times Restrictions RLE Weight Bearing: Weight bearing as tolerated         Mobility  Bed Mobility Bed Mobility: Supine to Sit Supine to Sit: 4: Min assist;With rails Details for Bed Mobility Assistance: assist for R LE, increased use of UEs to assist Transfers Transfers: Sit to Stand;Stand to Sit Sit to Stand: 4: Min assist;From bed;From elevated surface Stand to Sit: 4: Min assist;To chair/3-in-1 Details for Transfer Assistance: assist for weakness and pain, verbal cues for hand placement and R LE forward Ambulation/Gait Ambulation/Gait Assistance: 4: Min assist Ambulation Distance (Feet): 30 Feet Assistive device: Rolling walker Ambulation/Gait Assistance Details: verbal cue for sequence, denies dizziness Gait Pattern: Step-to pattern;Antalgic    Exercises     PT Goals Acute Rehab PT Goals PT Goal Formulation: With patient Time For Goal Achievement: 10/10/11 Potential to Achieve Goals: Good Pt will go Supine/Side to Sit: with supervision PT Goal: Supine/Side to Sit - Progress: Goal set today Pt will go Sit to Supine/Side: with  supervision PT Goal: Sit to Supine/Side - Progress: Goal set today Pt will go Sit to Stand: with supervision PT Goal: Sit to Stand - Progress: Goal set today Pt will go Stand to Sit: with supervision PT Goal: Stand to Sit - Progress: Goal set today Pt will Ambulate: >150 feet;with supervision;with least restrictive assistive device PT Goal: Ambulate - Progress: Goal set today Pt will Go Up / Down Stairs: 3-5 stairs;with min assist;with rail(s);with least restrictive assistive device PT Goal: Up/Down Stairs - Progress: Goal set today Pt will Perform Home Exercise Program: with supervision, verbal cues required/provided PT Goal: Perform Home Exercise Program - Progress: Goal set today  Visit Information  Last PT Received On: 10/03/11 Assistance Needed: +1    Subjective Data  Subjective: "I'm back again."   Prior Functioning  Home Living Lives With: Spouse Type of Home: House Home Access: Stairs to enter [employer] of Steps: 3 Entrance Stairs-Rails: Right Home Layout: Two level;Able to live on main level with bedroom/bathroom Home Adaptive Equipment: Walker - rolling;Bedside commode/3-in-1 Prior Function Level of Independence: Independent with assistive device(s) Comments: uses RW for mobility    Cognition  Overall Cognitive Status: Appears within functional limits for tasks assessed/performed Arousal/Alertness: Awake/alert Orientation Level: Appears intact for tasks assessed Behavior During Session: [facility] for tasks performed    Extremity/Trunk Assessment Right Upper Extremity Assessment RUE ROM/Strength/Tone: [facility] for tasks assessed Left Upper Extremity Assessment LUE ROM/Strength/Tone: [facility] for tasks assessed Right Lower Extremity Assessment RLE ROM/Strength/Tone: Unable to fully assess;Due to precautions Left Lower Extremity Assessment LLE ROM/Strength/Tone: [facility] for tasks assessed   Balance    End of Session PT - End of Session  Equipment Utilized During  Treatment: Gait belt;Right knee immobilizer Activity Tolerance: Patient limited by fatigue;Patient limited by pain Patient left: in chair;with call bell/phone within reach;with family/visitor present   Margarita Bobrowski,KATHrine E 10/03/2011, 11:43 AM Pager: [phone]

## 2011-10-03 NOTE — Progress Notes (Signed)
Physical Therapy Treatment Patient Details Name: [name] MRN: [id number] DOB: 10-11-1940 Today's Date: 10/03/2011 Time: 1256-1310 PT Time Calculation (min): 14 min  PT Assessment / Plan / Recommendation Comments on Treatment Session  Pt performed exercises reclined in chair.  Pt receiving blood and BP 87/54 so did not attempt ambulation.    Follow Up Recommendations  Home health PT    Equipment Recommendations  None recommended by PT    Frequency 7X/week   Plan Discharge plan remains appropriate;Frequency remains appropriate    Precautions / Restrictions Precautions Precautions: Knee Precaution Comments: NO MOTION OR BENDING OF R KNEE Required Braces or Orthoses: Knee Immobilizer - Right Knee Immobilizer - Right: On at all times Restrictions RLE Weight Bearing: Weight bearing as tolerated   Pertinent Vitals/Pain        Exercises Total Joint Exercises Ankle Circles/Pumps: AROM;Both;20 reps;Supine Quad Sets: Right;20 reps;Supine Gluteal Sets: Both;Other reps (comment);Seated (20 reps) Hip ABduction/ADduction: AAROM;Strengthening;20 reps;Supine;Right Straight Leg Raises: AAROM;Strengthening;Right;10 reps;Supine   PT Goals Acute Rehab PT Goals PT Goal Formulation: With patient Time For Goal Achievement: 10/10/11 Potential to Achieve Goals: Good Pt will go Supine/Side to Sit: with supervision PT Goal: Supine/Side to Sit - Progress: Goal set today Pt will go Sit to Supine/Side: with supervision PT Goal: Sit to Supine/Side - Progress: Goal set today Pt will go Sit to Stand: with supervision PT Goal: Sit to Stand - Progress: Goal set today Pt will go Stand to Sit: with supervision PT Goal: Stand to Sit - Progress: Goal set today Pt will Ambulate: >150 feet;with supervision;with least restrictive assistive device PT Goal: Ambulate - Progress: Goal set today Pt will Go Up / Down Stairs: 3-5 stairs;with min assist;with rail(s);with least restrictive assistive device PT  Goal: Up/Down Stairs - Progress: Goal set today Pt will Perform Home Exercise Program: with supervision, verbal cues required/provided PT Goal: Perform Home Exercise Program - Progress: Progressing toward goal  Visit Information  Last PT Received On: 10/03/11 Assistance Needed: +1    Subjective Data  Subjective: "Lunch was good."   Cognition  Overall Cognitive Status: Appears within functional limits for tasks assessed/performed Arousal/Alertness: Awake/alert Orientation Level: Appears intact for tasks assessed Behavior During Session: [facility] for tasks performed    Balance     End of Session PT - End of Session Equipment Utilized During Treatment: Gait belt;Right knee immobilizer Activity Tolerance: Patient limited by pain Patient left: in chair;with call bell/phone within reach;with family/visitor present    Amanada Philbrick,KATHrine E 10/03/2011, 2:09 PM Pager: [phone]

## 2011-10-03 NOTE — Progress Notes (Addendum)
Subjective: 1 Day Post-Op Procedure(s) (LRB): RESECTION OF TOTAL KNEE AND PLACEMENT OF CEMENTED SPACER KNEE (Right) Patient reports pain as mild and moderate.   Patient seen in rounds with Dr. Lequita Halt. Patient is well, but has had some minor complaints of pain in the knee, requiring pain medications We will start therapy today.  Plan is to go Home after hospital stay.  Will need IV ABX via PICC Line at home.  Objective: Vital signs in last 24 hours: Temp:  [97 F (36.1 C)-98.1 F (36.7 C)] 97.7 F (36.5 C) (04/25 0630) Pulse Rate:  [63-81] 72  (04/25 0630) Resp:  [8-18] 17  (04/25 0630) BP: (107-168)/(64-83) 107/65 mmHg (04/25 0630) SpO2:  [95 %-100 %] 95 % (04/25 0630) Weight:  [83.915 kg (185 lb)] 83.915 kg (185 lb) (04/24 2110)  Intake/Output from previous day:  Intake/Output Summary (Last 24 hours) at 10/03/11 0931 Last data filed at 10/03/11 0630  Gross per 24 hour  Intake   2775 ml  Output    775 ml  Net   2000 ml    Intake/Output this shift:    Labs:  Basename 10/03/11 0500  HGB 8.0*    Basename 10/03/11 0500  WBC 5.4  RBC 3.20*  HCT 25.6*  PLT 288    Basename 10/03/11 0500  NA 140  K 4.2  CL 105  CO2 30  BUN 15  CREATININE 0.95  GLUCOSE 119*  CALCIUM 8.7    Basename 10/03/11 0500  LABPT --  INR 1.14    EXAM General - Patient is Alert, Appropriate and Oriented Extremity - Neurovascular intact Sensation intact distally Compartment soft Dressing - dressing C/D/I Motor Function - intact, moving foot and toes well on exam.  Hemovacs are left in place.  Past Medical History  Diagnosis Date  . DJD (degenerative joint disease)     generalized  . Dyslipidemia   . Diabetes mellitus     borderline  . Squamous cell carcinoma   . Osteoarthritis   . HTN (hypertension)   . Spinal stenosis of lumbar region 04/17/2011  . Dyspnea 09-27-11    with extreme exertion.  Marland Kitchen COPD (chronic obstructive pulmonary disease) 09-27-11    mild  . Rotator cuff  syndrome 09-27-11    rotator cuff pain sometimes  . Carpal tunnel syndrome, right 09-27-11    right hand  . 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 Gallstones 09-27-11    hx. not bothered    Assessment/Plan: 1 Day Post-Op Procedure(s) (LRB): RESECTION OF TOTAL KNEE AND PLACEMENT OF CEMENTED SPACER KNEE (Right) Principal Problem:  *Septic arthritis of knee, right   Advance diet Up with therapy Continue foley due to strict I&O and urinary output monitoring Discharge home with home health - Will need IV ABX via PICC line at home  DVT Prophylaxis - Coumadin Weight-Bearing as tolerated to right leg but NO MOTION OR BENDING TO THE KNEE. Keep foley until tomorrow. No vaccines.  I.D. Consult for recommendations about ABX treatment.  Brief History of Past Surgery and Recent Events Jul 12, 2002 - Right UNI Knee Replacement - Dr. Debria Garret September 20, 2002 - Left UNI Knee Replacement - Dr. Debria Garret August 09, 2009 - Conversion of Left UNI to Left Total Knee Replacement - Dr. Ollen Gross  Feb. 2007 - Planned conversion of Right UNI to Right Total Knee Replacement but intraoperative culture was   positive for Gram positive Cocci - Had Placement of Antibiotic Spacer. 6 Weeks Later -  Removal Spacer, Right Total Knee Replacement - Dr. Cheryll Dessert.  Nov 07, 2010 - Revision of Right Total Knee Arthroplasty - Dr. Ollen Gross  *April 07, 2011 - Presented to the ED for tender, swollen knee. Aspirated by ED and found to have septic total knee.  Gram positive Cocci.  April 10, 2011 - Right Knee Arthrotomy, I & D Knee, Poly Exchange  Initially improved from the washout during the postoperative follow up.  (Swelling reoccurred in December and worsened with time.) January 2013 - Seen in office and aspirated - placed on Doxycycline. Aspirate found to be positive of Staph   aureus and switched to Bactrim DS - patient did not want any further surgery at that time and went on      suppression antibiotics. August 09, 2011 - Seen in office for increasing complaints - ordered Bone Scan which proved to be positive for   loosening and/or infection. September 05, 2011 - Seen by Dr. Lequita Halt and decided upon surgery as above.   [name] 10/03/2011, 9:31 AM

## 2011-10-03 NOTE — Consult Note (Signed)
Date of Admission:  10/02/2011  Date of Consult:  10/03/2011  Reason for Consult:Osteomyelitis R knee Referring Physician: Alusio  Impression/Recommendation Osteomyelitis R knee Resection of TKR 10-02-11 with placement of anbx spacer Previous MSSA Would- PIC as you have ordered Continue ancef Plan for 6 weeks of IV anbx Add rifampin.  Await Cx.  Caution with coumadin dosing as will interact with rifampin. Comment: Suspect that this continues to be MSSA, would be very unusual for her to get a new organism or to pick up resistance.  Re-implantation at the end of anbx treatment as deemed by the progress of her knee.   Thank you for this interesting consult. Especially thanks to Avel Peace for his thorough notes and operative history.   [name] is an 71 y.o. female.  HPI: 71 yo F with hx as listed below: Jul 12, 2002 - Right UNI Knee Replacement September 20, 2002 - Left UNI Knee Replacement   August 09, 2009 - Conversion of Left UNI to Left Total Knee Replacement  Feb. 2007 - Right UNI removed,  placement of Antibiotic Spacer due to positive cultures. 6 Weeks Later - Removal Spacer, Right Total Knee Replacement  Nov 07, 2010 - Revision of Right Total Knee Arthroplasty  April 07, 2011 - Presented to the ED for tender, swollen knee. Aspirated by ED and found to have septic total knee. Gram positive Cocci.  April 10, 2011 - Right Knee Arthrotomy, I & D Knee, Poly Exchange. Cx MSSA Initially improved from the washout during the postoperative follow up. She was treated with 30 days of IV anbx at SNF at that time.  (Swelling reoccurred in December and worsened with time.)  January 2013 - Seen in office and aspirated - placed on Doxycycline. Aspirate found to be positive of Staph aureus and switched to Bactrim DS   August 09, 2011 - Seen in office for increasing complaints - ordered Bone Scan which proved to be positive for loosening and/or infection.  She returned to hospital 4-21 and  underwent resection of R TKR and placement of antibiotic spacer on 10-02-11.   States that since Halloween of 2012 has had pain in her R TKR. She has had decreased ability to bear weight over the last 3 weeks. Has had no f/c but has felt cool, sweaty and itchy. She has had no proximal erythema of her knee. She has noticed swelling of her knee since 03-2011. She has had occasional episodes of increased heat in her knee.   Past Medical History  Diagnosis Date  . DJD (degenerative joint disease)     generalized  . Dyslipidemia   . Diabetes mellitus     borderline  . Squamous cell carcinoma   . Osteoarthritis   . HTN (hypertension)   . Spinal stenosis of lumbar region 04/17/2011  . Dyspnea 09-27-11    with extreme exertion.  Marland Kitchen COPD (chronic obstructive pulmonary disease) 09-27-11    mild  . Rotator cuff syndrome 09-27-11    rotator cuff pain sometimes  . Carpal tunnel syndrome, right 09-27-11    right hand  . 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 Gallstones 09-27-11    hx. not bothered    Past Surgical History  Procedure Date  . Cataract extraction   . Lumbar laminectomy/decompression microdiscectomy 04/19/2011    Procedure: LUMBAR LAMINECTOMY/DECOMPRESSION MICRODISCECTOMY;  Surgeon: Jacki Cones;  Location: [facility];  Service: Orthopedics;  Laterality: N/A;  Central decompression Lumbar two to lumbar three and  Three to Lumbar Four    (xray)  . Tubal ligation   . Joint replacement 09-27-11    '04/ right with resection for infection, now antibiotic spacer planned  . Joint replacement     not replaced- only surgery 2007/2011/2012.  ergies:   No Known Allergies  Medications:  Scheduled:   . acetaminophen  1,000 mg Intravenous Q6H  . acetaminophen  650 mg Oral Once  . aspirin  325 mg Oral q morning - 10a  .  ceFAZolin (ANCEF) IV  1 g Intravenous Q6H  .  ceFAZolin (ANCEF) IV  2 g Intravenous 60 min Pre-Op  . docusate sodium  100 mg Oral BID  . furosemide  10 mg  Intravenous Once  . HYDROmorphone      . HYDROmorphone      . labetalol  300 mg Oral BID  . patient's guide to using coumadin book   Does not apply Once  . warfarin  5 mg Oral Once  . warfarin  5 mg Oral ONCE-1800  . warfarin   Does not apply Once  . Warfarin - Pharmacist Dosing Inpatient   Does not apply q1800  . DISCONTD: chlorhexidine  60 mL Topical Once  . DISCONTD: dexamethasone  10 mg Intravenous Once    Social History:  reports that she has never smoked. She does not have any smokeless tobacco history on file. She reports that she does not drink alcohol or use illicit drugs.  Family History  Problem Relation Age of Onset  . Heart attack Mother   . Heart attack Brother   . Heart attack Brother   . Heart attack Brother   . Coronary artery disease Other     General ROS: normal: urination, bowel movements. see HPI.   Blood pressure 96/62, pulse 63, temperature 97.5 F (36.4 C), temperature source Oral, resp. rate 17, height 5' 0.5" (1.537 m), weight 83.915 kg (185 lb), SpO2 93.00%. General appearance: alert, cooperative and no distress Eyes: negative findings: conjunctivae and sclerae normal and pupils equal, round, reactive to light and accomodation Throat: normal findings: soft palate, uvula, and tonsils normal Neck: no adenopathy Lungs: clear to auscultation bilaterally Heart: regular rate and rhythm Abdomen: normal findings: bowel sounds normal and soft, non-tender Extremities: R knee wrapped.  Neurologic: Sensory: normal light touch BLE   Results for orders placed during the hospital encounter of 10/02/11 (from the past 48 hour(s))  TYPE AND SCREEN     Status: Normal (Preliminary result)   Collection Time   10/02/11  3:00 PM      Component Value Range Comment   ABO/RH(D) A NEG      Antibody Screen NEG      Sample Expiration 10/05/2011      Unit Number [id number]      Blood Component Type RED CELLS,LR      Unit division 00      Status of Unit ISSUED       Transfusion Status OK TO TRANSFUSE      Crossmatch Result Compatible      Unit Number [id number]      Blood Component Type RED CELLS,LR      Unit division 00      Status of Unit ALLOCATED      Transfusion Status OK TO TRANSFUSE      Crossmatch Result Compatible     GLUCOSE, CAPILLARY     Status: Normal   Collection Time   10/02/11  3:30 PM  Component Value Range Comment   Glucose-Capillary 92  70 - 99 (mg/dL)   GRAM STAIN     Status: Normal   Collection Time   10/02/11  4:48 PM      Component Value Range Comment   Specimen Description ABSCESS      Special Requests NONE      Gram Stain        Value: NO ORGANISMS SEEN ABUNDANT WBC PRESENT, PREDOMINANTLY PMN     Gram Stain Report Called to,Read Back By and Verified With: W ALLRED AT 1744 ON 04.24.2013 BY NBROOKS   Report Status 10/02/2011 FINAL     CULTURE, ROUTINE-ABSCESS     Status: Normal (Preliminary result)   Collection Time   10/02/11  4:48 PM      Component Value Range Comment   Specimen Description ABSCESS      Special Requests NONE      Gram Stain        Value: ABUNDANT WBC PRESENT,BOTH PMN AND MONONUCLEAR     NO SQUAMOUS EPITHELIAL CELLS SEEN     NO ORGANISMS SEEN   Culture NO GROWTH      Report Status PENDING     ANAEROBIC CULTURE     Status: Normal (Preliminary result)   Collection Time   10/02/11  4:48 PM      Component Value Range Comment   Specimen Description ABSCESS      Special Requests NONE      Gram Stain        Value: ABUNDANT WBC PRESENT,BOTH PMN AND MONONUCLEAR     NO SQUAMOUS EPITHELIAL CELLS SEEN     NO ORGANISMS SEEN   Culture        Value: NO ANAEROBES ISOLATED; CULTURE IN PROGRESS FOR 5 DAYS   Report Status PENDING     GLUCOSE, CAPILLARY     Status: Abnormal   Collection Time   10/02/11  6:16 PM      Component Value Range Comment   Glucose-Capillary 102 (*) 70 - 99 (mg/dL)    Comment 1 Documented in Chart      Comment 2 Notify RN     CBC     Status: Abnormal   Collection Time   10/03/11   5:00 AM      Component Value Range Comment   WBC 5.4  4.0 - 10.5 (K/uL)    RBC 3.20 (*) 3.87 - 5.11 (MIL/uL)    Hemoglobin 8.0 (*) 12.0 - 15.0 (g/dL)    HCT 16.1 (*) 09.6 - 46.0 (%)    MCV 80.0  78.0 - 100.0 (fL)    MCH 25.0 (*) 26.0 - 34.0 (pg)    MCHC 31.3  30.0 - 36.0 (g/dL)    RDW 04.5 (*) 40.9 - 15.5 (%)    Platelets 288  150 - 400 (K/uL)   BASIC METABOLIC PANEL     Status: Abnormal   Collection Time   10/03/11  5:00 AM      Component Value Range Comment   Sodium 140  135 - 145 (mEq/L)    Potassium 4.2  3.5 - 5.1 (mEq/L)    Chloride 105  96 - 112 (mEq/L)    CO2 30  19 - 32 (mEq/L)    Glucose, Bld 119 (*) 70 - 99 (mg/dL)    BUN 15  6 - 23 (mg/dL)    Creatinine, Ser 8.11  0.50 - 1.10 (mg/dL)    Calcium 8.7  8.4 - 10.5 (mg/dL)  GFR calc non Af Amer 60 (*) >90 (mL/min)    GFR calc Af Amer 69 (*) >90 (mL/min)   PROTIME-INR     Status: Normal   Collection Time   10/03/11  5:00 AM      Component Value Range Comment   Prothrombin Time 14.8  11.6 - 15.2 (seconds)    INR 1.14  0.00 - 1.49    GLUCOSE, CAPILLARY     Status: Abnormal   Collection Time   10/03/11  7:35 AM      Component Value Range Comment   Glucose-Capillary 109 (*) 70 - 99 (mg/dL)   PREPARE RBC (CROSSMATCH)     Status: Normal   Collection Time   10/03/11 10:30 AM      Component Value Range Comment   Order Confirmation ORDER PROCESSED BY BLOOD BANK     GLUCOSE, CAPILLARY     Status: Abnormal   Collection Time   10/03/11 11:29 AM      Component Value Range Comment   Glucose-Capillary 123 (*) 70 - 99 (mg/dL)       Component Value Date/Time   SDES ABSCESS 10/02/2011 1648   SDES ABSCESS 10/02/2011 1648   SDES ABSCESS 10/02/2011 1648   SPECREQUEST NONE 10/02/2011 1648   SPECREQUEST NONE 10/02/2011 1648   SPECREQUEST NONE 10/02/2011 1648   CULT NO GROWTH 10/02/2011 1648   CULT NO ANAEROBES ISOLATED; CULTURE IN PROGRESS FOR 5 DAYS 10/02/2011 1648   REPTSTATUS 10/02/2011 FINAL 10/02/2011 1648   REPTSTATUS PENDING  10/02/2011 1648   REPTSTATUS PENDING 10/02/2011 1648   No results found.  Thank you so much for this interesting consult,   Johny Sax [phone] 10/03/2011, 4:09 PM     LOS: 1 day

## 2011-10-04 DIAGNOSIS — M869 Osteomyelitis, unspecified: Secondary | ICD-10-CM

## 2011-10-04 DIAGNOSIS — T8450XA Infection and inflammatory reaction due to unspecified internal joint prosthesis, initial encounter: Secondary | ICD-10-CM

## 2011-10-04 DIAGNOSIS — Y831 Surgical operation with implant of artificial internal device as the cause of abnormal reaction of the patient, or of later complication, without mention of misadventure at the time of the procedure: Secondary | ICD-10-CM

## 2011-10-04 LAB — TYPE AND SCREEN
Antibody Screen: NEGATIVE
Unit division: 0

## 2011-10-04 LAB — CBC
Hemoglobin: 9.3 g/dL — ABNORMAL LOW (ref 12.0–15.0)
MCH: 25.4 pg — ABNORMAL LOW (ref 26.0–34.0)
MCV: 81.7 fL (ref 78.0–100.0)
Platelets: 255 10*3/uL (ref 150–400)
RBC: 3.66 MIL/uL — ABNORMAL LOW (ref 3.87–5.11)

## 2011-10-04 LAB — GLUCOSE, CAPILLARY
Glucose-Capillary: 127 mg/dL — ABNORMAL HIGH (ref 70–99)
Glucose-Capillary: 167 mg/dL — ABNORMAL HIGH (ref 70–99)

## 2011-10-04 LAB — BASIC METABOLIC PANEL
CO2: 26 mEq/L (ref 19–32)
Calcium: 8.5 mg/dL (ref 8.4–10.5)
Creatinine, Ser: 0.87 mg/dL (ref 0.50–1.10)
Glucose, Bld: 124 mg/dL — ABNORMAL HIGH (ref 70–99)

## 2011-10-04 MED ORDER — RIFAMPIN 300 MG PO CAPS: 300.0000 mg | ORAL_CAPSULE | Freq: Every day | ORAL | Status: AC

## 2011-10-04 MED ORDER — METHOCARBAMOL 500 MG PO TABS: 500.0000 mg | ORAL_TABLET | Freq: Four times a day (QID) | ORAL | Status: AC | PRN

## 2011-10-04 MED ORDER — OXYCODONE HCL 5 MG PO CAPS: 5.0000 mg | ORAL_CAPSULE | ORAL | Status: DC | PRN

## 2011-10-04 MED ORDER — SODIUM CHLORIDE 0.9 % IJ SOLN: 10.0000 mL | Freq: Two times a day (BID) | INTRAMUSCULAR | Status: DC

## 2011-10-04 MED ORDER — ENOXAPARIN SODIUM 40 MG/0.4ML ~~LOC~~ SOLN: 40.0000 mg | SUBCUTANEOUS | Status: DC

## 2011-10-04 MED ORDER — WARFARIN SODIUM 5 MG PO TABS: 5.0000 mg | ORAL_TABLET | Freq: Once | ORAL | Status: DC

## 2011-10-04 MED ORDER — WARFARIN SODIUM 7.5 MG PO TABS
7.5000 mg | ORAL_TABLET | Freq: Once | ORAL | Status: AC
  Administered 2011-10-04: 7.5 mg via ORAL
  Filled 2011-10-04: qty 1

## 2011-10-04 MED ORDER — CEFAZOLIN SODIUM 1-5 GM-% IV SOLN: 1.0000 g | Freq: Three times a day (TID) | INTRAVENOUS | Status: DC

## 2011-10-04 MED ORDER — SODIUM CHLORIDE 0.9 % IJ SOLN
10.0000 mL | INTRAMUSCULAR | Status: DC | PRN
  Administered 2011-10-05 – 2011-10-07 (×2): 10 mL

## 2011-10-04 NOTE — Progress Notes (Signed)
Physical Therapy Treatment Patient Details Name: [name] MRN: [id number] DOB: [birth year] Today's Date: 10/04/2011 Time: 3086-5784 PT Time Calculation (min): 19 min  PT Assessment / Plan / Recommendation Comments on Treatment Session  Pt able to tolerate increased ambulation distance this morning. Pt reports d/c possibly tomorrow.  Will practice stairs prior to d/c.    Follow Up Recommendations  Home health PT    Equipment Recommendations  None recommended by PT    Frequency     Plan Discharge plan remains appropriate;Frequency remains appropriate    Precautions / Restrictions Precautions Precautions: Knee Precaution Comments: NO MOTION OR BENDING OF R KNEE Required Braces or Orthoses: Knee Immobilizer - Right Knee Immobilizer - Right: On at all times Restrictions Weight Bearing Restrictions: Yes RLE Weight Bearing: Weight bearing as tolerated   Pertinent Vitals/Pain     Mobility  Bed Mobility Bed Mobility: Supine to Sit Supine to Sit: 4: Min assist Details for Bed Mobility Assistance: assist for R LE, increased use of UEs to assist Transfers Transfers: Sit to Stand;Stand to Sit Sit to Stand: 4: Min guard;With upper extremity assist;From bed Stand to Sit: 4: Min guard;To chair/3-in-1;With armrests Details for Transfer Assistance: verbal cues for hand placement and R LE forward Ambulation/Gait Ambulation/Gait Assistance: 4: Min assist Ambulation Distance (Feet): 100 Feet Assistive device: Rolling walker Ambulation/Gait Assistance Details: verbal cues for sequence, posture Gait Pattern: Step-to pattern;Antalgic    Exercises     PT Goals Acute Rehab PT Goals PT Goal: Supine/Side to Sit - Progress: Progressing toward goal PT Goal: Sit to Stand - Progress: Progressing toward goal PT Goal: Stand to Sit - Progress: Progressing toward goal PT Goal: Ambulate - Progress: Progressing toward goal  Visit Information  Last PT Received On: 10/04/11 Assistance Needed:  +1    Subjective Data  Subjective: "Let me finish putting on my makeup."   Cognition  Overall Cognitive Status: Appears within functional limits for tasks assessed/performed    Balance     End of Session PT - End of Session Equipment Utilized During Treatment: Right knee immobilizer Activity Tolerance: Patient tolerated treatment well Patient left: in chair;with call bell/phone within reach;with family/visitor present    Marcelles Clinard,KATHrine E 10/04/2011, 11:21 AM Pager: [phone]

## 2011-10-04 NOTE — Progress Notes (Signed)
OT Note:  Pt screened for OT.  She has had multiple surgeries, has DME and assist for ADLs at home.  [place], [place] [phone] 10/04/2011

## 2011-10-04 NOTE — Discharge Instructions (Signed)
Pick up stool softner and laxative for home. Do not submerge incision under water. May shower. Continue to use ice for pain and swelling from surgery.  NO MOTION OR BENDING TO RIGHT KNEE. KNEE IMMOBILIZER AT ALL TIMES.  Take Coumadin for three weeks and then discontinue.  The dose may need to be adjusted based upon the INR.  Please follow the INR and titrate Coumadin dose for a therapeutic range between 2.0 and 3.0 INR.  After completing the three weeks of Coumadin, the patient may stop the Coumadin and resume their 325 mg Aspirin daily.  Continue Lovenox injections until the INR is therapeutic at or greater than 2.0.  When INR reaches the therapeutic level, may discontinue the Lovenox injections.

## 2011-10-04 NOTE — Progress Notes (Signed)
INFECTIOUS DISEASE PROGRESS NOTE  ID: [name] is a 71 y.o. female with   Principal Problem:  *Septic arthritis of knee, right Active Problems:  Acute blood loss anemia  Postop Transfusion history  Subjective: Without complaints. Notes no problems with her anbx  Abtx:  Anti-infectives     Start     Dose/Rate Route Frequency Ordered Stop   10/03/11 2000   ceFAZolin (ANCEF) IVPB 1 g/50 mL premix        1 g 100 mL/hr over 30 Minutes Intravenous Every 8 hours 10/03/11 1727     10/03/11 1800   ceFAZolin (ANCEF) IVPB 1 g/50 mL premix  Status:  Discontinued        1 g 100 mL/hr over 30 Minutes Intravenous Every 8 hours 10/03/11 1623 10/03/11 1726   10/03/11 1700   rifampin (RIFADIN) capsule 300 mg        300 mg Oral Daily 10/03/11 1623     10/02/11 2300   ceFAZolin (ANCEF) IVPB 1 g/50 mL premix        1 g 100 mL/hr over 30 Minutes Intravenous Every 6 hours 10/02/11 2018 10/03/11 1040   10/02/11 1442   ceFAZolin (ANCEF) IVPB 2 g/50 mL premix        2 g 100 mL/hr over 30 Minutes Intravenous 60 min pre-op 10/02/11 1442 10/02/11 1647          Medications:  Scheduled:   . acetaminophen  1,000 mg Intravenous Q6H  . acetaminophen  650 mg Oral Once  . aspirin  325 mg Oral q morning - 10a  .  ceFAZolin (ANCEF) IV  1 g Intravenous Q8H  . docusate sodium  100 mg Oral BID  . furosemide  10 mg Intravenous Once  . labetalol  300 mg Oral BID  . rifampin  300 mg Oral Daily  . sodium chloride  10-40 mL Intracatheter Q12H  . warfarin  5 mg Oral ONCE-1800  . warfarin  7.5 mg Oral ONCE-1800  . warfarin   Does not apply Once  . Warfarin - Pharmacist Dosing Inpatient   Does not apply q1800  . DISCONTD:  ceFAZolin (ANCEF) IV  1 g Intravenous Q8H    Objective: Vital signs in last 24 hours: Temp:  [97.5 F (36.4 C)-98.5 F (36.9 C)] 97.9 F (36.6 C) (04/26 1430) Pulse Rate:  [63-79] 68  (04/26 1430) Resp:  [16-18] 16  (04/26 1430) BP: (109-163)/(67-80) 145/75 mmHg (04/26  1430) SpO2:  [93 %-99 %] 98 % (04/26 1430)   General appearance: alert, cooperative and no distress Extremities: R knee swollen, staples clean, increased heat (vs L).   Lab Results  Basename 10/04/11 0427 10/03/11 0500  WBC 4.8 5.4  HGB 9.3* 8.0*  HCT 29.9* 25.6*  NA 137 140  K 3.9 4.2  CL 104 105  CO2 26 30  BUN 14 15  CREATININE 0.87 0.95  GLU -- --   Liver Panel No results found for this basename: PROT:2,ALBUMIN:2,AST:2,ALT:2,ALKPHOS:2,BILITOT:2,BILIDIR:2,IBILI:2 in the last 72 hours Sedimentation Rate No results found for this basename: ESRSEDRATE in the last 72 hours C-Reactive Protein No results found for this basename: CRP:2 in the last 72 hours  Microbiology: Recent Results (from the past 240 hour(s))  SURGICAL PCR SCREEN     Status: Normal   Collection Time   09/27/11  1:51 PM      Component Value Range Status Comment   MRSA, PCR NEGATIVE  NEGATIVE  Final    Staphylococcus aureus NEGATIVE  NEGATIVE  Final   GRAM STAIN     Status: Normal   Collection Time   10/02/11  4:48 PM      Component Value Range Status Comment   Specimen Description ABSCESS   Final    Special Requests NONE   Final    Gram Stain     Final    Value: NO ORGANISMS SEEN ABUNDANT WBC PRESENT, PREDOMINANTLY PMN     Gram Stain Report Called to,Read Back By and Verified With: W ALLRED AT 1744 ON 04.24.2013 BY NBROOKS   Report Status 10/02/2011 FINAL   Final   CULTURE, ROUTINE-ABSCESS     Status: Normal (Preliminary result)   Collection Time   10/02/11  4:48 PM      Component Value Range Status Comment   Specimen Description ABSCESS   Final    Special Requests NONE   Final    Gram Stain     Final    Value: ABUNDANT WBC PRESENT,BOTH PMN AND MONONUCLEAR     NO SQUAMOUS EPITHELIAL CELLS SEEN     NO ORGANISMS SEEN   Culture NO GROWTH 1 DAY   Final    Report Status PENDING   Incomplete   ANAEROBIC CULTURE     Status: Normal (Preliminary result)   Collection Time   10/02/11  4:48 PM       Component Value Range Status Comment   Specimen Description ABSCESS   Final    Special Requests NONE   Final    Gram Stain     Final    Value: ABUNDANT WBC PRESENT,BOTH PMN AND MONONUCLEAR     NO SQUAMOUS EPITHELIAL CELLS SEEN     NO ORGANISMS SEEN   Culture     Final    Value: NO ANAEROBES ISOLATED; CULTURE IN PROGRESS FOR 5 DAYS   Report Status PENDING   Incomplete     Studies/Results: No results found.   Assessment/Plan: Osteomyelitis R Knee Multiple previous surgeries, resection of TKR and placement of anbx cement spacer 4-24. MSSA October 2012 Day 3 ancef, rifampin No change in anbx Await Cx.  No change in PT/INR  Dr Daiva Eves available if questions over w/e  Johny Sax Infectious Diseases [phone] 10/04/2011, 3:07 PM   LOS: 2 days

## 2011-10-04 NOTE — Progress Notes (Signed)
Physical Therapy Treatment Patient Details Name: [name] MRN: [id number] DOB: 17-Mar-1941 Today's Date: 10/04/2011 Time: 4098-1191 PT Time Calculation (min): 20 min  PT Assessment / Plan / Recommendation Comments on Treatment Session  Pt performed exercises reclined in chair.  Pt then assisted back to bed.    Follow Up Recommendations  Home health PT    Equipment Recommendations  None recommended by PT    Frequency     Plan Discharge plan remains appropriate;Frequency remains appropriate    Precautions / Restrictions Precautions Precautions: Knee Precaution Comments: NO MOTION OR BENDING OF R KNEE Required Braces or Orthoses: Knee Immobilizer - Right Knee Immobilizer - Right: On at all times Restrictions Weight Bearing Restrictions: Yes RLE Weight Bearing: Weight bearing as tolerated   Pertinent Vitals/Pain     Mobility  Bed Mobility Bed Mobility: Supine to Sit Supine to Sit: 4: Min assist;With rails Details for Bed Mobility Assistance: assist for R LE, increased use of UEs to assist Transfers Transfers: Sit to Stand;Stand to Sit;Stand Pivot Transfers Sit to Stand: 4: Min assist;With upper extremity assist;From chair/3-in-1 Stand to Sit: 4: Min guard;To bed;To elevated surface;With upper extremity assist Stand Pivot Transfers: 4: Min assist Details for Transfer Assistance: verbal cues for hand placement and R LE forward, assist to steady upon rising and then again with pivot to bed    Exercises Total Joint Exercises Ankle Circles/Pumps: AROM;Both;20 reps;Supine Quad Sets: 20 reps;Supine;Both Gluteal Sets: Both;Other reps (comment);Seated (20 reps) Hip ABduction/ADduction: AAROM;Strengthening;20 reps;Supine;Right Straight Leg Raises: AAROM;Strengthening;Right;Supine;15 reps   PT Goals Acute Rehab PT Goals PT Goal: Supine/Side to Sit - Progress: Progressing toward goal PT Goal: Sit to Supine/Side - Progress: Progressing toward goal PT Goal: Sit to Stand -  Progress: Progressing toward goal PT Goal: Stand to Sit - Progress: Progressing toward goal PT Goal: Ambulate - Progress: Progressing toward goal PT Goal: Perform Home Exercise Program - Progress: Progressing toward goal  Visit Information  Last PT Received On: 10/04/11 Assistance Needed: +1    Subjective Data  Subjective: "I'm ready for you."   Cognition  Overall Cognitive Status: Appears within functional limits for tasks assessed/performed    Balance     End of Session PT - End of Session Equipment Utilized During Treatment: Right knee immobilizer Activity Tolerance: Patient limited by pain Patient left: with call bell/phone within reach;with family/visitor present;in bed    Derik Fults,KATHrine E 10/04/2011, 2:25 PM Pager: [phone]

## 2011-10-04 NOTE — Progress Notes (Signed)
ANTICOAGULATION CONSULT NOTE - Follow Up Consult  Pharmacy Consult for Coumadin Indication: VTE prophylaxis  No Known Allergies  Patient Measurements: Height: 5' 0.5" (153.7 cm) Weight: 185 lb (83.915 kg) IBW/kg (Calculated) : 46.65   Vital Signs: Temp: 98.5 F (36.9 C) (04/26 0521) BP: 116/68 mmHg (04/26 0521) Pulse Rate: 79  (04/26 0521)  Labs:  Basename 10/04/11 0427 10/03/11 0500  HGB 9.3* 8.0*  HCT 29.9* 25.6*  PLT 255 288  APTT -- --  LABPROT 15.8* 14.8  INR 1.23 1.14  HEPARINUNFRC -- --  CREATININE 0.87 0.95  CKTOTAL -- --  CKMB -- --  TROPONINI -- --   Estimated Creatinine Clearance: 59.3 ml/min (by C-G formula based on Cr of 0.87).  Assessment:  42 YOF s/p reimplantation of cemented spacer for infected R TKA. Coumadin for VTE prophylaxis. Coumadin score = 5.  Patient has been on Coumadin before but unable to see doses/trends through Epic.  ID consulted 4/25, previous MSSA infection - recommended Ancef and Rifampin x 6 weeks. Pending culture.    Concurrent use of rifampin and warfarin may decrease anticoagulant effectiveness of warfarin due to increased metabolism.   INR slowly rising as expected with Coumadin initiation.  No bleeding/complications reported, Hgb better today s/p PRBCs 4/25.     Coumadin education completed 4/25  Goal of Therapy:  INR 2-3   Plan:   Coumadin 7.5 mg po x 1 tonight  Watch drug interaction with Rifampin.  Daily PT/INR  Geoffry Paradise Thi 10/04/2011,9:09 AM

## 2011-10-04 NOTE — Progress Notes (Signed)
Subjective: 2 Days Post-Op Procedure(s) (LRB): RESECTION OF TOTAL KNEE AND PLACEMENT OF CEMENTED SPACER KNEE (Right) Patient reports pain as mild and moderate.   Patient is well, but has had some minor complaints of pain in the knee, requiring pain medications Plan is to go Home after hospital stay.  Objective: Vital signs in last 24 hours: Temp:  [97.6 F (36.4 C)-98.5 F (36.9 C)] 97.9 F (36.6 C) (04/26 1430) Pulse Rate:  [68-79] 68  (04/26 1430) Resp:  [16-18] 16  (04/26 1430) BP: (113-163)/(67-80) 145/75 mmHg (04/26 1430) SpO2:  [93 %-99 %] 98 % (04/26 1430)  Intake/Output from previous day:  Intake/Output Summary (Last 24 hours) at 10/04/11 1648 Last data filed at 10/04/11 1255  Gross per 24 hour  Intake 1322.5 ml  Output   1360 ml  Net  -37.5 ml    Intake/Output this shift: Total I/O In: 720 [P.O.:720] Out: 600 [Urine:600]  Labs:  [facility] 10/04/11 0427 10/03/11 0500  HGB 9.3* 8.0*    Basename 10/04/11 0427 10/03/11 0500  WBC 4.8 5.4  RBC 3.66* 3.20*  HCT 29.9* 25.6*  PLT 255 288    Basename 10/04/11 0427 10/03/11 0500  NA 137 140  K 3.9 4.2  CL 104 105  CO2 26 30  BUN 14 15  CREATININE 0.87 0.95  GLUCOSE 124* 119*  CALCIUM 8.5 8.7    Basename 10/04/11 0427 10/03/11 0500  LABPT -- --  INR 1.23 1.14    EXAM General - Patient is Alert, Appropriate and Oriented Extremity - Neurovascular intact Sensation intact distally Dressing/Incision - no drainage, healing Motor Function - intact, moving foot and toes well on exam. Drains and pain pump tubing removed.  Past Medical History  Diagnosis Date  . DJD (degenerative joint disease)     generalized  . Dyslipidemia   . Diabetes mellitus     borderline  . Squamous cell carcinoma   . Osteoarthritis   . HTN (hypertension)   . Spinal stenosis of lumbar region 04/17/2011  . Dyspnea 09-27-11    with extreme exertion.  Marland Kitchen COPD (chronic obstructive pulmonary disease) 09-27-11    mild  . Rotator cuff  syndrome 09-27-11    rotator cuff pain sometimes  . Carpal tunnel syndrome, right 09-27-11    right hand  . 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 Gallstones 09-27-11    hx. not bothered    Assessment/Plan: 2 Days Post-Op Procedure(s) (LRB): REIMPLANTATION OF CEMENTED SPACER KNEE (Right) Principal Problem:  *Septic arthritis of knee, right Active Problems:  Acute blood loss anemia  Postop Transfusion history   Up with therapy Discharge home with home health  DVT Prophylaxis - Coumadin Weight-Bearing as tolerated to right leg  [name] 10/04/2011, 4:48 PM

## 2011-10-05 LAB — CBC
Hemoglobin: 9.3 g/dL — ABNORMAL LOW (ref 12.0–15.0)
RBC: 3.57 MIL/uL — ABNORMAL LOW (ref 3.87–5.11)
WBC: 5.4 10*3/uL (ref 4.0–10.5)

## 2011-10-05 LAB — PROTIME-INR
INR: 1.55 — ABNORMAL HIGH (ref 0.00–1.49)
Prothrombin Time: 18.9 seconds — ABNORMAL HIGH (ref 11.6–15.2)

## 2011-10-05 LAB — GLUCOSE, CAPILLARY
Glucose-Capillary: 113 mg/dL — ABNORMAL HIGH (ref 70–99)
Glucose-Capillary: 156 mg/dL — ABNORMAL HIGH (ref 70–99)

## 2011-10-05 MED ORDER — WARFARIN SODIUM 5 MG PO TABS
5.0000 mg | ORAL_TABLET | Freq: Once | ORAL | Status: AC
  Administered 2011-10-05: 5 mg via ORAL
  Filled 2011-10-05: qty 1

## 2011-10-05 NOTE — Progress Notes (Signed)
ANTICOAGULATION CONSULT NOTE - Follow Up Consult  Pharmacy Consult for Coumadin Indication: VTE prophylaxis  No Known Allergies  Patient Measurements: Height: 5' 0.5" (153.7 cm) Weight: 185 lb (83.915 kg) IBW/kg (Calculated) : 46.65   Vital Signs: Temp: 98.3 F (36.8 C) (04/27 0600) Temp src: Oral (04/27 0600) BP: 151/78 mmHg (04/27 0600) Pulse Rate: 72  (04/27 0600)  Labs:  Basename 10/05/11 0455 10/04/11 0427 10/03/11 0500  HGB 9.3* 9.3* --  HCT 28.8* 29.9* 25.6*  PLT 272 255 288  APTT -- -- --  LABPROT 18.9* 15.8* 14.8  INR 1.55* 1.23 1.14  HEPARINUNFRC -- -- --  CREATININE -- 0.87 0.95  CKTOTAL -- -- --  CKMB -- -- --  TROPONINI -- -- --   Estimated Creatinine Clearance: 59.3 ml/min (by C-G formula based on Cr of 0.87).  Assessment:  28 YOF s/p reimplantation of cemented spacer for infected R TKA on coumadin for VTE prophylaxis.    ID consulted 4/25, previous MSSA infection - recommended Ancef and Rifampin x 6 weeks. Pending culture.    Concurrent use of rifampin and warfarin may decrease anticoagulant effectiveness of warfarin due to increased metabolism.   INR rising toward goal.   No bleeding/complications reported, Hgb better today s/p PRBCs 4/25.     Coumadin education completed 4/25  Goal of Therapy:  INR 2-3   Plan:   Coumadin 5 mg po x 1 tonight  Watch drug interaction with Rifampin.  Daily PT/INR  Arlyn Bumpus, Loma Messing PharmD 8:29 AM 10/05/2011

## 2011-10-05 NOTE — Progress Notes (Addendum)
Orthopedics Progress Note  Subjective: Pt doing well. Minimal pain to left knee today Denies any other symptoms or issues  Objective:  Filed Vitals:   10/05/11 0600  BP: 151/78  Pulse: 72  Temp: 98.3 F (36.8 C)  Resp: 18    General: Awake and alert  Musculoskeletal: left knee incision healing well, nv intact distally, dressings changed Neurovascularly intact  Lab Results  Component Value Date   WBC 5.4 10/05/2011   HGB 9.3* 10/05/2011   HCT 28.8* 10/05/2011   MCV 80.7 10/05/2011   PLT 272 10/05/2011       Component Value Date/Time   NA 137 10/04/2011 0427   K 3.9 10/04/2011 0427   CL 104 10/04/2011 0427   CO2 26 10/04/2011 0427   GLUCOSE 124* 10/04/2011 0427   BUN 14 10/04/2011 0427   CREATININE 0.87 10/04/2011 0427   CALCIUM 8.5 10/04/2011 0427   GFRNONAA 66* 10/04/2011 0427   GFRAA 77* 10/04/2011 0427    Lab Results  Component Value Date   INR 1.55* 10/05/2011   INR 1.23 10/04/2011   INR 1.14 10/03/2011    Assessment/Plan: POD #3  s/p Procedure(s):left  REIMPLANTATION OF CEMENTED SPACER KNEE  Continue antibiotics PT/OT as able D/c planning  Viviann Spare R. Ranell Patrick, MD 10/05/2011 7:20 AM    Discontinue Foley today. Possible discharge home today if antibiotic arrangements can be made, otherwise discharge Monday

## 2011-10-05 NOTE — Progress Notes (Signed)
Physical Therapy Treatment Patient Details Name: ADYLENE DLUGOSZ MRN: [id number] DOB: 05-May-1941 Today's Date: 10/05/2011 Time: 4098-1191 PT Time Calculation (min): 17 min  PT Assessment / Plan / Recommendation Comments on Treatment Session  Pt able to increase ambulation distance this morning. Pt reports d/c now planned for Monday.      Follow Up Recommendations  Home health PT    Equipment Recommendations  None recommended by PT    Frequency     Plan Discharge plan remains appropriate;Frequency remains appropriate    Precautions / Restrictions Precautions Precautions: Knee Precaution Comments: NO MOTION OR BENDING OF R KNEE Required Braces or Orthoses: Knee Immobilizer - Right Knee Immobilizer - Right: On at all times Restrictions Weight Bearing Restrictions: Yes RLE Weight Bearing: Weight bearing as tolerated   Pertinent Vitals/Pain     Mobility  Bed Mobility Bed Mobility: Not assessed (pt sitting EOB with spouse upon arrival) Transfers Transfers: Sit to Stand;Stand to Sit Sit to Stand: 4: Min guard;With upper extremity assist;From bed Stand to Sit: 4: Min guard;To chair/3-in-1;With armrests Details for Transfer Assistance: verbal cues for hand placement and R LE forward Ambulation/Gait Ambulation/Gait Assistance: 4: Min guard Ambulation Distance (Feet): 160 Feet Assistive device: Rolling walker Ambulation/Gait Assistance Details: verbal cue for sequence and safe RW distance Gait Pattern: Step-to pattern;Antalgic    Exercises     PT Goals Acute Rehab PT Goals PT Goal: Sit to Stand - Progress: Progressing toward goal PT Goal: Stand to Sit - Progress: Progressing toward goal PT Goal: Ambulate - Progress: Progressing toward goal  Visit Information  Last PT Received On: 10/05/11 Assistance Needed: +1    Subjective Data  Subjective: "This height helps better."  (lowered RW so pt could bear weight with straight elbows)   Cognition  Overall Cognitive Status:  Appears within functional limits for tasks assessed/performed    Balance     End of Session PT - End of Session Equipment Utilized During Treatment: Right knee immobilizer Activity Tolerance: Patient tolerated treatment well Patient left: in chair;with call bell/phone within reach;with family/visitor present    Aeneas Longsworth,KATHrine E 10/05/2011, 1:04 PM Pager: [phone]

## 2011-10-06 LAB — GLUCOSE, CAPILLARY
Glucose-Capillary: 96 mg/dL (ref 70–99)
Glucose-Capillary: 107 mg/dL — ABNORMAL HIGH (ref 70–99)
Glucose-Capillary: 138 mg/dL — ABNORMAL HIGH (ref 70–99)

## 2011-10-06 LAB — CULTURE, ROUTINE-ABSCESS

## 2011-10-06 LAB — PROTIME-INR: Prothrombin Time: 19.1 seconds — ABNORMAL HIGH (ref 11.6–15.2)

## 2011-10-06 MED ORDER — LISINOPRIL 20 MG PO TABS
20.0000 mg | ORAL_TABLET | Freq: Every day | ORAL | Status: DC
  Administered 2011-10-06 – 2011-10-07 (×2): 20 mg via ORAL
  Filled 2011-10-06 (×2): qty 1

## 2011-10-06 MED ORDER — WARFARIN SODIUM 7.5 MG PO TABS
7.5000 mg | ORAL_TABLET | Freq: Once | ORAL | Status: AC
  Administered 2011-10-06: 7.5 mg via ORAL
  Filled 2011-10-06: qty 1

## 2011-10-06 NOTE — Progress Notes (Signed)
Cm spoke with pt concerning dc planning. Pt set up with [name] for [facility] services. Pt concerned about weekly co-pay for IV ABX which totals 700.00 for 5 weeks. Pt's spouse states when pt dischrged from hospital in Nov of 2012, there was no co-pay for IV ABX, although medicine delivered was not used due to pt discharging to SNF. [employer] rep donna aware of pt's concern. CM and [place] rep spoke with pt with spouse at bedside. [facility] rep Talmadge Coventry contacted for price reference. [facility] unable to give price until Monday 10/07/11. CSW notified of possible SNF placement due to pt's concern of medication cost if discharges home with IV ABX.    Leonie Green [phone]

## 2011-10-06 NOTE — Progress Notes (Signed)
Physical Therapy Treatment Patient Details Name: [name] MRN: [id number] DOB: 07/26/1940 Today's Date: 10/06/2011 Time: 4098-1191 PT Time Calculation (min): 24 min  PT Assessment / Plan / Recommendation Comments on Treatment Session  Pt performed stairs and ambulation today.  Pt reports she did do exercises on her own yesterday (ankle pumps, hip abduction, and glut sets) and reminded her to perform quad sets as well.  Reviewed no motion/bending of knee with exercises.    Follow Up Recommendations  Home health PT    Equipment Recommendations  None recommended by PT    Frequency     Plan Discharge plan remains appropriate;Frequency remains appropriate    Precautions / Restrictions Precautions Precautions: Knee Precaution Comments: NO MOTION OR BENDING OF R KNEE Required Braces or Orthoses: Knee Immobilizer - Right Knee Immobilizer - Right: On at all times Restrictions RLE Weight Bearing: Weight bearing as tolerated   Pertinent Vitals/Pain     Mobility  Bed Mobility Bed Mobility: Supine to Sit Supine to Sit: 5: Supervision;HOB elevated Details for Bed Mobility Assistance: pt educated to use UEs or L LE to assist with moving/supporting R LE Transfers Transfers: Sit to Stand;Stand to Sit Sit to Stand: 4: Min guard;With upper extremity assist;From chair/3-in-1;From bed Stand to Sit: 4: Min guard;With armrests;With upper extremity assist;To chair/3-in-1 Details for Transfer Assistance: verbal cues for hand placement and R LE forward Ambulation/Gait Ambulation/Gait Assistance: 4: Min guard Ambulation Distance (Feet): 160 Feet Assistive device: Rolling walker Ambulation/Gait Assistance Details: verbal cues for sequence and to use UEs to take weight off R LE due to pain and occasional popping sound with WBing Gait Pattern: Step-to pattern;Antalgic Stairs: Yes Stairs Assistance: 3: Mod assist Stairs Assistance Details (indicate cue type and reason): verbal cues for sequence  and placement of RW, pt min/guard with ascending and modA for descending, given handout Stair Management Technique: Step to pattern;Backwards;With walker Number of Stairs: 3     Exercises     PT Goals Acute Rehab PT Goals PT Goal: Supine/Side to Sit - Progress: Met PT Goal: Sit to Stand - Progress: Progressing toward goal PT Goal: Stand to Sit - Progress: Progressing toward goal PT Goal: Ambulate - Progress: Progressing toward goal PT Goal: Up/Down Stairs - Progress: Progressing toward goal PT Goal: Perform Home Exercise Program - Progress: Progressing toward goal  Visit Information  Last PT Received On: 10/06/11 Assistance Needed: +1    Subjective Data  Subjective: "stairs backwards??"   Cognition  Overall Cognitive Status: Appears within functional limits for tasks assessed/performed    Balance     End of Session PT - End of Session Equipment Utilized During Treatment: Right knee immobilizer Activity Tolerance: Patient tolerated treatment well Patient left: in chair;with call bell/phone within reach    [facility] E 10/06/2011, 12:51 PM Pager: [phone]

## 2011-10-06 NOTE — Progress Notes (Signed)
Orthopedics Progress Note  Subjective: Pt resting comfortably with mild soreness to right knee today otherwise doing well  Objective:  Filed Vitals:   10/06/11 0643  BP: 177/76  Pulse: 73  Temp: 98.2 F (36.8 C)  Resp: 16    General: Awake and alert  Musculoskeletal: right knee incision healing well, nv intact distally Neurovascularly intact  Lab Results  Component Value Date   WBC 5.4 10/05/2011   HGB 9.3* 10/05/2011   HCT 28.8* 10/05/2011   MCV 80.7 10/05/2011   PLT 272 10/05/2011       Component Value Date/Time   NA 137 10/04/2011 0427   K 3.9 10/04/2011 0427   CL 104 10/04/2011 0427   CO2 26 10/04/2011 0427   GLUCOSE 124* 10/04/2011 0427   BUN 14 10/04/2011 0427   CREATININE 0.87 10/04/2011 0427   CALCIUM 8.5 10/04/2011 0427   GFRNONAA 66* 10/04/2011 0427   GFRAA 77* 10/04/2011 0427    Lab Results  Component Value Date   INR 1.57* 10/06/2011   INR 1.55* 10/05/2011   INR 1.23 10/04/2011    Assessment/Plan: POD #4 s/p Procedure(s): REIMPLANTATION OF CEMENTED SPACER KNEE  D/c planning Pt/Ot as able  Almedia Balls. Ranell Patrick, MD 10/06/2011 8:19 AM

## 2011-10-06 NOTE — Care Management Note (Unsigned)
    Page 1 of 1   10/06/2011     1:30:21 PM   CARE MANAGEMENT NOTE 10/06/2011  Patient:  [name], [name]   Account Number:  [id number]  Date Initiated:  10/06/2011  Documentation initiated by:  Zelie Asbill  Subjective/Objective Assessment:   71 yo female admitted s/p implantation of cement spacer in right knee. PTA pt lived home with spouse.     Action/Plan:   Home vs SNF   Anticipated DC Date:  10/07/2011   Anticipated DC Plan:  HOME W HOME HEALTH SERVICES  In-house referral  Clinical Social Worker      DC Planning Services  CM consult      [facility] Choice  HOME HEALTH   Choice offered to / List presented to:  [name]   DME arranged  NA      DME agency  NA     HH arranged  HH-1 RN  HH-2 PT      [facility] agency  [facility]   Status of service:  In process, will continue to follow Medicare Important Message given?   (If response is "NO", the following Medicare IM given date fields will be blank) Date Medicare IM given:   Date Additional Medicare IM given:    Discharge Disposition:    Per UR Regulation:    If discussed at Long Length of Stay Meetings, dates discussed:    Comments:  10/06/11 1329 Vanessa Ransome,Rn,BSN [phone] Cm spoke with pt concerning dc planning. Pt set up with [name] for [facility] services. Pt concerned about weekly co-pay for IV ABX which totals 700.00 for 5 weeks. Pt's spouse states when pt dischrged from hospital in Nov of 2012, there was no co-pay for IV ABX, although medicine delivered was not used due to pt discharging to SNF. [employer] rep [name] aware of pt's concern. CM and [place] rep spoke with pt with spouse at bedside. [facility] rep [name] contacted for price reference. [facility] unable to give price until Monday 10/07/11. CSW notified of possible SNF placement due to pt's concern of medication cost if discharges home with IV ABX.

## 2011-10-06 NOTE — Progress Notes (Signed)
ANTICOAGULATION CONSULT NOTE - Follow Up Consult  Pharmacy Consult for Coumadin Indication: VTE prophylaxis  No Known Allergies  Patient Measurements: Height: 5' 0.5" (153.7 cm) Weight: 185 lb (83.915 kg) IBW/kg (Calculated) : 46.65   Vital Signs: Temp: 98.2 F (36.8 C) (04/28 0643) Temp src: Oral (04/28 0643) BP: 177/76 mmHg (04/28 0643) Pulse Rate: 73  (04/28 0643)  Labs:  Basename 10/06/11 0455 10/05/11 0455 10/04/11 0427  HGB -- 9.3* 9.3*  HCT -- 28.8* 29.9*  PLT -- 272 255  APTT -- -- --  LABPROT 19.1* 18.9* 15.8*  INR 1.57* 1.55* 1.23  HEPARINUNFRC -- -- --  CREATININE -- -- 0.87  CKTOTAL -- -- --  CKMB -- -- --  TROPONINI -- -- --   Estimated Creatinine Clearance: 59.3 ml/min (by C-G formula based on Cr of 0.87).  Assessment:  32 YOF s/p reimplantation of cemented spacer for infected R TKA on coumadin for VTE prophylaxis.    ID consulted 4/25, previous MSSA infection - recommended Ancef and Rifampin x 6 weeks. Pending culture.    Concurrent use of rifampin and warfarin may decrease anticoagulant effectiveness of warfarin due to increased metabolism.   INR trending toward goal.   No bleeding/complications reported, CBC stable  Goal of Therapy:  INR 2-3   Plan:   Coumadin 7.5 mg po x 1 tonight  Watch drug interaction with Rifampin.  Daily PT/INR  Anjeanette Petzold, Loma Messing PharmD 6:50 AM 10/06/2011

## 2011-10-07 DIAGNOSIS — Z452 Encounter for adjustment and management of vascular access device: Secondary | ICD-10-CM | POA: Diagnosis not present

## 2011-10-07 DIAGNOSIS — J449 Chronic obstructive pulmonary disease, unspecified: Secondary | ICD-10-CM | POA: Diagnosis not present

## 2011-10-07 DIAGNOSIS — E119 Type 2 diabetes mellitus without complications: Secondary | ICD-10-CM | POA: Diagnosis not present

## 2011-10-07 DIAGNOSIS — Z89529 Acquired absence of unspecified knee: Secondary | ICD-10-CM | POA: Diagnosis not present

## 2011-10-07 DIAGNOSIS — Z4789 Encounter for other orthopedic aftercare: Secondary | ICD-10-CM | POA: Diagnosis not present

## 2011-10-07 DIAGNOSIS — Z96659 Presence of unspecified artificial knee joint: Secondary | ICD-10-CM | POA: Diagnosis not present

## 2011-10-07 LAB — ANAEROBIC CULTURE

## 2011-10-07 LAB — GLUCOSE, CAPILLARY

## 2011-10-07 MED ORDER — WARFARIN SODIUM 7.5 MG PO TABS
7.5000 mg | ORAL_TABLET | Freq: Once | ORAL | Status: DC
  Filled 2011-10-07: qty 1

## 2011-10-07 MED ORDER — HEPARIN SOD (PORK) LOCK FLUSH 100 UNIT/ML IV SOLN
250.0000 [IU] | INTRAVENOUS | Status: AC | PRN
  Administered 2011-10-07: 250 [IU]

## 2011-10-07 NOTE — Progress Notes (Signed)
CSW met with pt/spouse to assist with d/c planning. Pt has declined [facility] SNF placement ( for IV antibiotics ) and is planning to return home with [facility] services. RNCM is assisting with d/c planning.  Cori Razor  LCSW [phone]

## 2011-10-07 NOTE — Progress Notes (Signed)
Physical Therapy Treatment Patient Details Name: [name] MRN: [id number] DOB: 01/25/1941 Today's Date: 10/07/2011 Time: 4098-1191 PT Time Calculation (min): 24 min  PT Assessment / Plan / Recommendation Comments on Treatment Session  Pt doing well with mobility, ready to DC from PT standpoint. Increased pain today, ice applied after PT which provided good relief.     Follow Up Recommendations  Home health PT    Equipment Recommendations  None recommended by PT    Frequency 7X/week   Plan Discharge plan remains appropriate    Precautions / Restrictions Precautions Precautions: Knee Precaution Comments: no motion or bending R knee Required Braces or Orthoses: Knee Immobilizer - Right Knee Immobilizer - Right: On at all times Restrictions Weight Bearing Restrictions: Yes RLE Weight Bearing: Weight bearing as tolerated   Pertinent Vitals/Pain *8/10 pain with walking; 3/10 at rest with ice to R knee**    Mobility  Bed Mobility Bed Mobility: Sit to Supine Sit to Supine: 4: Min assist Details for Bed Mobility Assistance: pt 85%, assist for RLE Transfers Transfers: Sit to Stand;Stand to Sit Sit to Stand: 6: Modified independent (Device/Increase time);With upper extremity assist;From chair/3-in-1 Stand to Sit: 6: Modified independent (Device/Increase time);To bed;With upper extremity assist Details for Transfer Assistance: good technique with transfers Ambulation/Gait Ambulation/Gait Assistance: 6: Modified independent (Device/Increase time) Ambulation Distance (Feet): 150 Feet Assistive device: Rolling walker Ambulation/Gait Assistance Details: distance limited by RLE pain Gait Pattern: Step-to pattern;Antalgic    Exercises Total Joint Exercises Ankle Circles/Pumps: AROM;15 reps;Both;Supine Quad Sets: AROM;10 reps;Both;Supine Towel Squeeze: AROM;10 reps;Supine;Both Hip ABduction/ADduction: AAROM;Right;10 reps;Supine Straight Leg Raises: AAROM;Right;10 reps;Supine    PT Goals Acute Rehab PT Goals PT Goal Formulation: With patient/family Time For Goal Achievement: 10/10/11 Potential to Achieve Goals: Good Pt will go Supine/Side to Sit: with supervision PT Goal: Supine/Side to Sit - Progress: Met Pt will go Sit to Supine/Side: with supervision PT Goal: Sit to Supine/Side - Progress: Progressing toward goal Pt will go Sit to Stand: with modified independence PT Goal: Sit to Stand - Progress: Met Pt will go Stand to Sit: with modified independence PT Goal: Stand to Sit - Progress: Met Pt will Ambulate: >150 feet;with rolling walker;with modified independence PT Goal: Ambulate - Progress: Met Pt will Go Up / Down Stairs: 3-5 stairs;with min assist PT Goal: Up/Down Stairs - Progress: Progressing toward goal Pt will Perform Home Exercise Program: with supervision, verbal cues required/provided PT Goal: Perform Home Exercise Program - Progress: Progressing toward goal  Visit Information  Last PT Received On: 10/07/11 Assistance Needed: +1    Subjective Data  Subjective: It's really hurting today.     Cognition  Overall Cognitive Status: Appears within functional limits for tasks assessed/performed Arousal/Alertness: Awake/alert Orientation Level: Appears intact for tasks assessed Behavior During Session: [facility] for tasks performed    Balance     End of Session PT - End of Session Equipment Utilized During Treatment: Gait belt;Right knee immobilizer Activity Tolerance: Patient limited by pain Patient left: in bed;with family/visitor present;with call bell/phone within reach Nurse Communication: Mobility status    [name] 10/07/2011, 9:35 AM [phone]

## 2011-10-07 NOTE — Progress Notes (Signed)
CARE MANAGEMENT NOTE 10/07/2011  Patient:  [name], [name]   Account Number:  [id number]  Date Initiated:  10/06/2011  Documentation initiated by:  DAVIS,TYMEEKA  Subjective/Objective Assessment:   71 yo female admitted s/p implantation of cement spacer in right knee. PTA pt lived home with spouse.     Action/Plan:   Home vs SNF   Anticipated DC Date:  10/07/2011   Anticipated DC Plan:  HOME W HOME HEALTH SERVICES  In-house referral  Clinical Social Worker      DC Associate Professor  CM consult      [facility] Choice  HOME HEALTH   Choice offered to / List presented to:  [name]   DME arranged  NA      DME agency  NA     HH arranged  HH-1 RN  HH-2 PT      [facility] agency  [facility]   Status of service:  Completed, signed off Medicare Important Message given?  NO (If response is "NO", the following Medicare IM given date fields will be blank) Date Medicare IM given:   Date Additional Medicare IM given:    Discharge Disposition:  HOME W HOME HEALTH SERVICES     comments:  10/07/2011 Raynelle Bring BSN CCM [phone] Pt has decided that she is going home with [facility] services. Spouse will be caregiver. [name] will provide [facility] services and [facility] infusion will provide iv abx that is needed for home infusion. Services will start today. Pt discharged 10/07/2011.

## 2011-10-07 NOTE — Progress Notes (Signed)
INFECTIOUS DISEASE PROGRESS NOTE  ID: [name] is a 71 y.o. female with   Principal Problem:  *Septic arthritis of knee, right Active Problems:  Acute blood loss anemia  Postop Transfusion history  Subjective: without complaints, urine is orange. No problems with anbx  Abtx:  Anti-infectives     Start     Dose/Rate Route Frequency Ordered Stop   10/04/11 0000   ceFAZolin (ANCEF) 1-5 GM-%        1 g 100 mL/hr over 30 Minutes Intravenous Every 8 hours 10/04/11 1919     10/04/11 0000   rifampin (RIFADIN) 300 MG capsule        300 mg Oral Daily 10/04/11 1919 10/14/11 2359   10/03/11 2000   ceFAZolin (ANCEF) IVPB 1 g/50 mL premix        1 g 100 mL/hr over 30 Minutes Intravenous Every 8 hours 10/03/11 1727     10/03/11 1800   ceFAZolin (ANCEF) IVPB 1 g/50 mL premix  Status:  Discontinued        1 g 100 mL/hr over 30 Minutes Intravenous Every 8 hours 10/03/11 1623 10/03/11 1726   10/03/11 1700   rifampin (RIFADIN) capsule 300 mg        300 mg Oral Daily 10/03/11 1623     10/02/11 2300   ceFAZolin (ANCEF) IVPB 1 g/50 mL premix        1 g 100 mL/hr over 30 Minutes Intravenous Every 6 hours 10/02/11 2018 10/03/11 1040   10/02/11 1442   ceFAZolin (ANCEF) IVPB 2 g/50 mL premix        2 g 100 mL/hr over 30 Minutes Intravenous 60 min pre-op 10/02/11 1442 10/02/11 1647          Medications:  Scheduled:   . acetaminophen  650 mg Oral Once  . aspirin  325 mg Oral q morning - 10a  .  ceFAZolin (ANCEF) IV  1 g Intravenous Q8H  . docusate sodium  100 mg Oral BID  . labetalol  300 mg Oral BID  . lisinopril  20 mg Oral Daily  . rifampin  300 mg Oral Daily  . sodium chloride  10-40 mL Intracatheter Q12H  . warfarin  7.5 mg Oral ONCE-1800  . warfarin  7.5 mg Oral ONCE-1800  . warfarin   Does not apply Once  . Warfarin - Pharmacist Dosing Inpatient   Does not apply q1800    Objective: Vital signs in last 24 hours: Temp:  [98.2 F (36.8 C)-98.3 F (36.8 C)] 98.3 F  (36.8 C) (04/29 0442) Pulse Rate:  [71-72] 72  (04/29 0442) Resp:  [16] 16  (04/29 0442) BP: (155-180)/(67-84) 157/84 mmHg (04/29 0442) SpO2:  [95 %-98 %] 97 % (04/29 0442)   General appearance: alert, cooperative and no distress Extremities: RUE PIC is clean. RLE is wrapped with brace.   Lab Results  [facility] 10/05/11 0455  WBC 5.4  HGB 9.3*  HCT 28.8*  NA --  K --  CL --  CO2 --  BUN --  CREATININE --  GLU --   Liver Panel No results found for this basename: PROT:2,ALBUMIN:2,AST:2,ALT:2,ALKPHOS:2,BILITOT:2,BILIDIR:2,IBILI:2 in the last 72 hours Sedimentation Rate No results found for this basename: ESRSEDRATE in the last 72 hours C-Reactive Protein No results found for this basename: CRP:2 in the last 72 hours  Microbiology: Recent Results (from the past 240 hour(s))  SURGICAL PCR SCREEN     Status: Normal   Collection Time   09/27/11  1:51 PM      Component Value Range Status Comment   MRSA, PCR NEGATIVE  NEGATIVE  Final    Staphylococcus aureus NEGATIVE  NEGATIVE  Final   GRAM STAIN     Status: Normal   Collection Time   10/02/11  4:48 PM      Component Value Range Status Comment   Specimen Description ABSCESS   Final    Special Requests NONE   Final    Gram Stain     Final    Value: NO ORGANISMS SEEN ABUNDANT WBC PRESENT, PREDOMINANTLY PMN     Gram Stain Report Called to,Read Back By and Verified With: W ALLRED AT 1744 ON 04.24.2013 BY NBROOKS   Report Status 10/02/2011 FINAL   Final   CULTURE, ROUTINE-ABSCESS     Status: Normal   Collection Time   10/02/11  4:48 PM      Component Value Range Status Comment   Specimen Description ABSCESS   Final    Special Requests NONE   Final    Gram Stain     Final    Value: ABUNDANT WBC PRESENT,BOTH PMN AND MONONUCLEAR     NO SQUAMOUS EPITHELIAL CELLS SEEN     NO ORGANISMS SEEN   Culture     Final    Value: FEW STAPHYLOCOCCUS AUREUS     Note: RIFAMPIN AND GENTAMICIN SHOULD NOT BE USED AS SINGLE DRUGS FOR TREATMENT  OF STAPH INFECTIONS.   Report Status 10/06/2011 FINAL   Final    Organism ID, Bacteria STAPHYLOCOCCUS AUREUS   Final   ANAEROBIC CULTURE     Status: Normal   Collection Time   10/02/11  4:48 PM      Component Value Range Status Comment   Specimen Description ABSCESS   Final    Special Requests NONE   Final    Gram Stain     Final    Value: ABUNDANT WBC PRESENT,BOTH PMN AND MONONUCLEAR     NO SQUAMOUS EPITHELIAL CELLS SEEN     NO ORGANISMS SEEN   Culture NO ANAEROBES ISOLATED   Final    Report Status 10/07/2011 FINAL   Final     Studies/Results: No results found.   Assessment/Plan: Osteomyelitis R Knee (MSSA) Multiple previous surgeries, resection of TKR and placement of anbx cement spacer 4-24.  MSSA October 2012  Day 6 ancef, rifampin Would plan for 42 days of IV ancef and po rifampin.  Weekly CBC, CRP ESR, CMP. orders given to CMgr She can f/u in ID clinic in 5 weeks.  I have given her my contact info as well as let my clinic mgr know to schedule her for f/u appt.   Johny Sax Infectious Diseases [phone] 10/07/2011, 1:28 PM   LOS: 5 days

## 2011-10-07 NOTE — Progress Notes (Signed)
ANTICOAGULATION CONSULT NOTE - Follow Up Consult  Pharmacy Consult for Coumadin Indication: VTE prophylaxis  No Known Allergies  Patient Measurements: Height: 5' 0.5" (153.7 cm) Weight: 185 lb (83.915 kg) IBW/kg (Calculated) : 46.65   Vital Signs: Temp: 98.3 F (36.8 C) (04/29 0442) Temp src: Oral (04/29 0442) BP: 157/84 mmHg (04/29 0442) Pulse Rate: 72  (04/29 0442)  Labs:  [name] 10/07/11 0538 10/06/11 0455 10/05/11 0455  HGB -- -- 9.3*  HCT -- -- 28.8*  PLT -- -- 272  APTT -- -- --  LABPROT 19.7* 19.1* 18.9*  INR 1.64* 1.57* 1.55*  HEPARINUNFRC -- -- --  CREATININE -- -- --  CKTOTAL -- -- --  CKMB -- -- --  TROPONINI -- -- --   Estimated Creatinine Clearance: 59.3 ml/min (by C-G formula based on Cr of 0.87).  Assessment:  4 YOF s/p reimplantation of cemented spacer for infected R TKA 4/27. Pt on coumadin for VTE prophylaxis.    ID consulted 4/25, previous MSSA infection - recommended Ancef and Rifampin x 6 weeks. Cx = MSSA  Concurrent use of rifampin and warfarin may decrease anticoagulant effectiveness of warfarin due to increased metabolism.   INR trending toward goal.   No bleeding/complications reported, CBC stable 4/27  Goal of Therapy:  INR 2-3   Plan:   Coumadin 7.5 mg po x 1 tonight  Monitor for effect of drug interaction with Rifampin.  Daily PT/INR  Gwen Her PharmD  [phone] 10/07/2011 7:21 AM

## 2011-10-07 NOTE — Progress Notes (Signed)
 Subjective: 5 Days Post-Op Procedure(s) (LRB): REIMPLANTATION OF CEMENTED SPACER KNEE (Right) Patient reports pain as mild.   Patient seen in rounds with Dr. Lequita Halt. Patient is well, and has had no acute complaints or problems  Objective: Vital signs in last 24 hours: Temp:  [98.2 F (36.8 C)-98.3 F (36.8 C)] 98.3 F (36.8 C) (04/29 0442) Pulse Rate:  [71-72] 72  (04/29 0442) Resp:  [16] 16  (04/29 0442) BP: (155-180)/(67-84) 157/84 mmHg (04/29 0442) SpO2:  [95 %-98 %] 97 % (04/29 0442)  Intake/Output from previous day:  Intake/Output Summary (Last 24 hours) at 10/07/11 0807 Last data filed at 10/07/11 0644  Gross per 24 hour  Intake   1515 ml  Output   2375 ml  Net   -860 ml    Intake/Output this shift:    Labs:  Basename 10/05/11 0455  HGB 9.3*    Basename 10/05/11 0455  WBC 5.4  RBC 3.57*  HCT 28.8*  PLT 272   No results found for this basename: NA:2,K:2,CL:2,CO2:2,BUN:2,CREATININE:2,GLUCOSE:2,CALCIUM:2 in the last 72 hours  Basename 10/07/11 0538 10/06/11 0455  LABPT -- --  INR 1.64* 1.57*    EXAM: General - Patient is Alert, Appropriate and Oriented Extremity - Neurovascular intact Sensation intact distally Incision - clean, dry, no drainage, healing Motor Function - intact, moving foot and toes well on exam.   Assessment/Plan: 5 Days Post-Op Procedure(s) (LRB): REIMPLANTATION OF CEMENTED SPACER KNEE (Right) Procedure(s) (LRB): REIMPLANTATION OF CEMENTED SPACER KNEE (Right) Past Medical History  Diagnosis Date  . DJD (degenerative joint disease)     generalized  . Dyslipidemia   . Diabetes mellitus     borderline  . Squamous cell carcinoma   . Osteoarthritis   . HTN (hypertension)   . Spinal stenosis of lumbar region 04/17/2011  . Dyspnea 09-27-11    with extreme exertion.  Marland Kitchen COPD (chronic obstructive pulmonary disease) 09-27-11    mild  . Rotator cuff syndrome 09-27-11    rotator cuff pain sometimes  . Carpal tunnel syndrome, right  09-27-11    right hand  . 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 Gallstones 09-27-11    hx. not bothered   Principal Problem:  *Septic arthritis of knee, right Active Problems:  Acute blood loss anemia  Postop Transfusion history   Up with therapy. Plan to discharge today but disposition is pending based on her antibiotic arrangements. Diet - Cardiac diet Follow up - in 8-10 days Activity - WBAT Disposition - either home or skilled facility depending upon arragnements for her antibiotic treatment. Condition Upon Discharge - Good D/C Meds - See DC Summary DVT Prophylaxis - Lovenox and Coumadin  ,  10/07/2011, 8:07 AM

## 2011-10-08 DIAGNOSIS — E119 Type 2 diabetes mellitus without complications: Secondary | ICD-10-CM | POA: Diagnosis not present

## 2011-10-08 DIAGNOSIS — Z452 Encounter for adjustment and management of vascular access device: Secondary | ICD-10-CM | POA: Diagnosis not present

## 2011-10-08 DIAGNOSIS — Z4789 Encounter for other orthopedic aftercare: Secondary | ICD-10-CM | POA: Diagnosis not present

## 2011-10-08 DIAGNOSIS — J449 Chronic obstructive pulmonary disease, unspecified: Secondary | ICD-10-CM | POA: Diagnosis not present

## 2011-10-08 DIAGNOSIS — Z96659 Presence of unspecified artificial knee joint: Secondary | ICD-10-CM | POA: Diagnosis not present

## 2011-10-11 DIAGNOSIS — Z96659 Presence of unspecified artificial knee joint: Secondary | ICD-10-CM | POA: Diagnosis not present

## 2011-10-11 DIAGNOSIS — Z792 Long term (current) use of antibiotics: Secondary | ICD-10-CM | POA: Diagnosis not present

## 2011-10-11 DIAGNOSIS — Z452 Encounter for adjustment and management of vascular access device: Secondary | ICD-10-CM | POA: Diagnosis not present

## 2011-10-11 DIAGNOSIS — J449 Chronic obstructive pulmonary disease, unspecified: Secondary | ICD-10-CM | POA: Diagnosis not present

## 2011-10-11 DIAGNOSIS — Z4789 Encounter for other orthopedic aftercare: Secondary | ICD-10-CM | POA: Diagnosis not present

## 2011-10-11 DIAGNOSIS — E119 Type 2 diabetes mellitus without complications: Secondary | ICD-10-CM | POA: Diagnosis not present

## 2011-10-15 DIAGNOSIS — Z452 Encounter for adjustment and management of vascular access device: Secondary | ICD-10-CM | POA: Diagnosis not present

## 2011-10-15 DIAGNOSIS — J449 Chronic obstructive pulmonary disease, unspecified: Secondary | ICD-10-CM | POA: Diagnosis not present

## 2011-10-15 DIAGNOSIS — Z96659 Presence of unspecified artificial knee joint: Secondary | ICD-10-CM | POA: Diagnosis not present

## 2011-10-15 DIAGNOSIS — Z4789 Encounter for other orthopedic aftercare: Secondary | ICD-10-CM | POA: Diagnosis not present

## 2011-10-15 DIAGNOSIS — E119 Type 2 diabetes mellitus without complications: Secondary | ICD-10-CM | POA: Diagnosis not present

## 2011-10-17 DIAGNOSIS — Z96659 Presence of unspecified artificial knee joint: Secondary | ICD-10-CM | POA: Diagnosis not present

## 2011-10-17 DIAGNOSIS — E119 Type 2 diabetes mellitus without complications: Secondary | ICD-10-CM | POA: Diagnosis not present

## 2011-10-17 DIAGNOSIS — Z452 Encounter for adjustment and management of vascular access device: Secondary | ICD-10-CM | POA: Diagnosis not present

## 2011-10-17 DIAGNOSIS — J449 Chronic obstructive pulmonary disease, unspecified: Secondary | ICD-10-CM | POA: Diagnosis not present

## 2011-10-17 DIAGNOSIS — Z4789 Encounter for other orthopedic aftercare: Secondary | ICD-10-CM | POA: Diagnosis not present

## 2011-10-21 DIAGNOSIS — J449 Chronic obstructive pulmonary disease, unspecified: Secondary | ICD-10-CM | POA: Diagnosis not present

## 2011-10-21 DIAGNOSIS — Z452 Encounter for adjustment and management of vascular access device: Secondary | ICD-10-CM | POA: Diagnosis not present

## 2011-10-21 DIAGNOSIS — Z96659 Presence of unspecified artificial knee joint: Secondary | ICD-10-CM | POA: Diagnosis not present

## 2011-10-21 DIAGNOSIS — E119 Type 2 diabetes mellitus without complications: Secondary | ICD-10-CM | POA: Diagnosis not present

## 2011-10-21 DIAGNOSIS — Z4789 Encounter for other orthopedic aftercare: Secondary | ICD-10-CM | POA: Diagnosis not present

## 2011-10-22 ENCOUNTER — Other Ambulatory Visit: Payer: Self-pay | Admitting: Orthopedic Surgery

## 2011-10-22 DIAGNOSIS — L089 Local infection of the skin and subcutaneous tissue, unspecified: Secondary | ICD-10-CM | POA: Diagnosis not present

## 2011-10-22 DIAGNOSIS — Z4789 Encounter for other orthopedic aftercare: Secondary | ICD-10-CM | POA: Diagnosis not present

## 2011-10-22 DIAGNOSIS — Z96659 Presence of unspecified artificial knee joint: Secondary | ICD-10-CM | POA: Diagnosis not present

## 2011-10-22 DIAGNOSIS — Z452 Encounter for adjustment and management of vascular access device: Secondary | ICD-10-CM | POA: Diagnosis not present

## 2011-10-22 DIAGNOSIS — E119 Type 2 diabetes mellitus without complications: Secondary | ICD-10-CM | POA: Diagnosis not present

## 2011-10-22 DIAGNOSIS — M009 Pyogenic arthritis, unspecified: Secondary | ICD-10-CM | POA: Diagnosis not present

## 2011-10-22 DIAGNOSIS — Z792 Long term (current) use of antibiotics: Secondary | ICD-10-CM | POA: Diagnosis not present

## 2011-10-22 DIAGNOSIS — J449 Chronic obstructive pulmonary disease, unspecified: Secondary | ICD-10-CM | POA: Diagnosis not present

## 2011-10-22 NOTE — Progress Notes (Signed)
Preoperative surgical orders have been place into the Epic hospital system for [name] on 10/22/2011, 12:23 PM  by Patrica Duel for surgery on 12/04/2011.  Preop Total Knee orders including Bupivacaine On-Q pump, IV Tylenol, and IV Decadron as long as there are no contraindications to the above medications.

## 2011-10-23 DIAGNOSIS — J449 Chronic obstructive pulmonary disease, unspecified: Secondary | ICD-10-CM | POA: Diagnosis not present

## 2011-10-23 DIAGNOSIS — Z96659 Presence of unspecified artificial knee joint: Secondary | ICD-10-CM | POA: Diagnosis not present

## 2011-10-23 DIAGNOSIS — Z4789 Encounter for other orthopedic aftercare: Secondary | ICD-10-CM | POA: Diagnosis not present

## 2011-10-23 DIAGNOSIS — Z452 Encounter for adjustment and management of vascular access device: Secondary | ICD-10-CM | POA: Diagnosis not present

## 2011-10-23 DIAGNOSIS — E119 Type 2 diabetes mellitus without complications: Secondary | ICD-10-CM | POA: Diagnosis not present

## 2011-10-23 NOTE — Discharge Summary (Signed)
Physician Discharge Summary   Patient ID: [name] MRN: [id number] DOB/AGE: Apr 01, 1941 71 y.o.  Admit date: 10/02/2011 Discharge date: 10/07/2011  Primary Diagnosis: Infection of right total knee replacement   Admission Diagnoses:  Past Medical History  Diagnosis Date  . DJD (degenerative joint disease)     generalized  . Dyslipidemia   . Diabetes mellitus     borderline  . Squamous cell carcinoma   . Osteoarthritis   . HTN (hypertension)   . Spinal stenosis of lumbar region 04/17/2011  . Dyspnea 09-27-11    with extreme exertion.  Marland Kitchen COPD (chronic obstructive pulmonary disease) 09-27-11    mild  . Rotator cuff syndrome 09-27-11    rotator cuff pain sometimes  . Carpal tunnel syndrome, right 09-27-11    right hand  . 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 Gallstones 09-27-11    hx. not bothered   Discharge Diagnoses:   Principal Problem:  *Septic arthritis of knee, right Active Problems:  Acute blood loss anemia  Postop Transfusion history  Procedure:  Procedure(s) (LRB): REIMPLANTATION OF CEMENTED SPACER KNEE (Right)   Consults: ID - Dr. Ninetta Lights  HPI: Ms. Bejarano is a 71 year old female, long complex history regards to her right knee. She has a septic arthritis with Staph species. She is presenting now for irrigation, debridement, resection, arthroplasty and placement of antibiotic spacer.  Laboratory Data: Hospital Outpatient Visit on 09/27/2011  Component Date Value Range Status  . MRSA, PCR  09/27/2011 NEGATIVE  NEGATIVE Final  . Staphylococcus aureus  09/27/2011 NEGATIVE  NEGATIVE Final   Comment:                                 The Xpert SA Assay (FDA                          approved for NASAL specimens                          only), is one component of                          a comprehensive surveillance                          program.  It is not intended                          to diagnose infection nor to                          guide or  monitor treatment.  Marland Kitchen aPTT (seconds) 09/27/2011 37  24-37 Final   Comment:                                 IF BASELINE aPTT IS ELEVATED,                          SUGGEST PATIENT RISK ASSESSMENT                          BE USED TO DETERMINE APPROPRIATE  ANTICOAGULANT THERAPY.  . WBC (K/uL) 09/27/2011 7.7  4.0-10.5 Final  . RBC (MIL/uL) 09/27/2011 3.92  3.87-5.11 Final  . Hemoglobin (g/dL) 29/52/8413 9.7* 24.4-01.0 Final  . HCT (%) 09/27/2011 31.1* 36.0-46.0 Final  . MCV (fL) 09/27/2011 79.3  78.0-100.0 Final  . MCH (pg) 09/27/2011 24.7* 26.0-34.0 Final  . MCHC (g/dL) 27/25/3664 40.3  47.4-25.9 Final  . RDW (%) 09/27/2011 16.9* 11.5-15.5 Final  . Platelets (K/uL) 09/27/2011 325  150-400 Final  . Sodium (mEq/L) 09/27/2011 139  135-145 Final  . Potassium (mEq/L) 09/27/2011 4.0  3.5-5.1 Final  . Chloride (mEq/L) 09/27/2011 101  96-112 Final  . CO2 (mEq/L) 09/27/2011 28  19-32 Final  . Glucose, Bld (mg/dL) 56/38/7564 332* 95-18 Final  . BUN (mg/dL) 84/16/6063 17  0-16 Final  . Creatinine, Ser (mg/dL) 06/18/3233 5.73  2.20-2.54 Final  . Calcium (mg/dL) 27/11/2374 9.7  2.8-31.5 Final  . Total Protein (g/dL) 17/61/6073 7.3  7.1-0.6 Final  . Albumin (g/dL) 26/94/8546 3.7  2.7-0.3 Final  . AST (U/L) 09/27/2011 21  0-37 Final  . ALT (U/L) 09/27/2011 15  0-35 Final  . Alkaline Phosphatase (U/L) 09/27/2011 120* 39-117 Final  . Total Bilirubin (mg/dL) 50/02/3817 0.3  2.9-9.3 Final  . GFR calc non Af Amer (mL/min) 09/27/2011 66* >90 Final  . GFR calc Af Amer (mL/min) 09/27/2011 76* >90 Final   Comment:                                 The eGFR has been calculated                          using the CKD EPI equation.                          This calculation has not been                          validated in all clinical                          situations.                          eGFR's persistently                          <90 mL/min signify                           possible Chronic Kidney Disease.  Marland Kitchen Prothrombin Time (seconds) 09/27/2011 14.2  11.6-15.2 Final  . INR  09/27/2011 1.08  0.00-1.49 Final  . Color, Urine  09/27/2011 YELLOW  YELLOW Final  . APPearance  09/27/2011 CLEAR  CLEAR Final  . Specific Gravity, Urine  09/27/2011 1.017  1.005-1.030 Final  . pH  09/27/2011 6.5  5.0-8.0 Final  . Glucose, UA (mg/dL) 71/69/6789 NEGATIVE  NEGATIVE Final  . Hgb urine dipstick  09/27/2011 NEGATIVE  NEGATIVE Final  . Bilirubin Urine  09/27/2011 NEGATIVE  NEGATIVE Final  . Ketones, ur (mg/dL) 38/03/1750 NEGATIVE  NEGATIVE Final  . Protein, ur (mg/dL) 02/58/5277 NEGATIVE  NEGATIVE Final  . Urobilinogen, UA (mg/dL) 82/42/3536 0.2  1.4-4.3 Final  . Nitrite  09/27/2011 NEGATIVE  NEGATIVE Final  .  Leukocytes, UA  09/27/2011 SMALL* NEGATIVE Final  . Squamous Epithelial / LPF  09/27/2011 FEW* RARE Final  . WBC, UA (WBC/hpf) 09/27/2011 0-2  <3 Final   No results found for this basename: HGB:5 in the last 72 hours No results found for this basename: WBC:2,RBC:2,HCT:2,PLT:2 in the last 72 hours No results found for this basename: NA:2,K:2,CL:2,CO2:2,BUN:2,CREATININE:2,GLUCOSE:2,CALCIUM:2 in the last 72 hours No results found for this basename: LABPT:2,INR:2 in the last 72 hours  X-Rays:No results found.  EKG: Orders placed during the hospital encounter of 11/07/10  . EKG     Hospital Course: Patient was admitted to [facility] and taken to the OR and underwent the above state procedure without complications.  Patient tolerated the procedure well and was later transferred to the recovery room and then to the orthopaedic floor for postoperative care.  They were given PO and IV analgesics for pain control following their surgery.  They placed on postoperative antibiotics and started on DVT prophylaxis in the form of Coumadin. Needed IV ABX via PICC Line at home. Discharge planning consulted to help with postop disposition, equipment needs and IV ABX  arrangements.  Patient had a tough night on the evening of surgery and started to get up OOB with therapy on day one. Hemovac drain was left in place. I.D. Consulted for recommendations about ABX treatment. Dr. Ninetta Lights recommended to continue ancef. Plan for 6 weeks of IV anbx. Add rifampin. Await Cx.  2 Days Post-Op patient reported pain as mild and moderate. Drains and pain pump tubing removed.  Allowed to be Weight-Bearing as tolerated to right leg but NO MOTION OR BENDING TO THE KNEE. POD 3 discontinued Foley. Pt doing well. Minimal pain to left knee today POD 4 Pt was resting comfortably with mild soreness to right knee today otherwise doing well. Continued on her ABX. 5 Days Post-Op Patient was doing well, and had no acute complaints or problems. She had no problems with anbx. Dr. Ninetta Lights recommended 42 days of IV ancef and po rifampin.  Discharged home. Incision was healing well. Patient was seen in rounds and was ready to go home.   Discharge Medications: Prior to Admission medications   Medication Sig Start Date End Date Taking? Authorizing Provider  labetalol (NORMODYNE) 300 MG tablet Take 300 mg by mouth 2 (two) times daily.     Yes Historical Provider, MD  quinapril-hydrochlorothiazide (ACCURETIC) 20-12.5 MG per tablet Take 1 tablet by mouth every morning.    Yes Historical Provider, MD  triamcinolone cream (KENALOG) 0.1 % Apply 1 application topically 2 (two) times daily as needed. Rash   Yes Historical Provider, MD  ceFAZolin (ANCEF) 1-5 GM-% Inject 50 mLs (1 g total) into the vein every 8 (eight) hours. IV CEFAZOLIN treatment for a total of six weeks via PICC line. 10/04/11   Aquila Menzie, PA  enoxaparin (LOVENOX) 40 MG/0.4ML injection Inject 0.4 mLs (40 mg total) into the skin daily. Continue Lovenox injections until the INR is therapeutic at or greater than 2.0.  When INR reaches the therapeutic level, may discontinue the Lovenox injections. 10/04/11   Kennedi Lizardo Julien Girt, PA    Ferrous Sulfate (IRON) 325 (65 FE) MG TABS Take 65 mg by mouth 2 (two) times a week.    Historical Provider, MD  oxycodone (OXY-IR) 5 MG capsule Take 1-2 capsules (5-10 mg total) by mouth every 4 (four) hours as needed. Pain 10/04/11   Halina Asano, PA  POTASSIUM PO Take 1 tablet by mouth See admin instructions.  Pt buys the over the counter and takes it daily    Historical Provider, MD  warfarin (COUMADIN) 5 MG tablet Take 1 tablet (5 mg total) by mouth one time only at 6 PM. Take Coumadin for three weeks and then discontinue.  The dose may need to be adjusted based upon the INR.  Please follow the INR and titrate Coumadin dose for a therapeutic range between 2.0 and 3.0 INR.  After completing the three weeks of Coumadin, the patient may stop the Coumadin and resume their 81 mg Aspirin daily. 10/04/11 10/03/12  Charleston Hankin, PA    Diet: heart healthy Activity:WBAT Follow-up:in 8-10 days Disposition - Home Discharged Condition: good   Discharge Orders    Future Appointments: Provider: Department: Dept Phone: Center:   11/13/2011 11:15 AM Ginnie Smart, MD [facility] [phone] [facility]     Future Orders Please Complete By Expires   Diet - low sodium heart healthy      Diet Carb Modified      Call MD / Call 911      Comments:   If you experience chest pain or shortness of breath, CALL 911 and be transported to the hospital emergency room.  If you develope a fever above 101 F, pus (white drainage) or increased drainage or redness at the wound, or calf pain, call your surgeon's office.   Discharge instructions      Comments:   Pick up stool softner and laxative for home. Do not submerge incision under water. May shower. Continue to use ice for pain and swelling from surgery.  Take Coumadin for three weeks and then discontinue.  The dose may need to be adjusted based upon the INR.  Please follow the INR and titrate Coumadin dose for a therapeutic range between 2.0 and  3.0 INR.  After completing the three weeks of Coumadin, the patient may stop the Coumadin and resume their 325 mg Aspirin daily.  Continue Lovenox injections until the INR is therapeutic at or greater than 2.0.  When INR reaches the therapeutic level, may discontinue the Lovenox injections.  Patient has  PICC line placed for six weeks on IV antibiotics.   Constipation Prevention      Comments:   Drink plenty of fluids.  Prune juice may be helpful.  You may use a stool softener, such as Colace (over the counter) 100 mg twice a day.  Use MiraLax (over the counter) for constipation as needed.   Increase activity slowly as tolerated      Weight Bearing as taught in Physical Therapy      Comments:   Use a walker or crutches as instructed.  NO MOTION OR BENDING TO RIGHT KNEE. KNEE IMMOBILIZER AT ALL TIMES.   Patient may shower      Comments:   You may shower without a dressing once there is no drainage.  Do not wash over the wound.  If drainage remains, do not shower until drainage stops.   Driving restrictions      Comments:   No driving until released by the physician.   Lifting restrictions      Comments:   No lifting until released by the physician.   TED hose      Comments:   Use stockings (TED hose) for 3 weeks on both leg(s).  You may remove them at night for sleeping.   Change dressing      Comments:   Change dressing daily with sterile 4 x  4 inch gauze dressing and apply TED hose. Do not submerge the incision under water.   TED hose      Comments:   Use stockings (TED hose) for 3 weeks on both leg(s).  You may remove them at night for sleeping.   Do not sit on low chairs, stoools or toilet seats, as it may be difficult to get up from low surfaces        Medication List  As of 10/23/2011  7:48 PM   STOP taking these medications         aspirin 325 MG tablet      CALTRATE 600 PLUS-VIT D PO      cholecalciferol 1000 UNITS tablet      fish oil-omega-3 fatty acids 1000 MG  capsule      GARLIQUE PO      lactobacillus acidophilus Tabs      meloxicam 15 MG tablet      mulitivitamin with minerals Tabs      naproxen sodium 220 MG tablet         TAKE these medications         ceFAZolin 1-5 GM-%   Commonly known as: ANCEF   Inject 50 mLs (1 g total) into the vein every 8 (eight) hours. IV CEFAZOLIN treatment for a total of six weeks via PICC line.      enoxaparin 40 MG/0.4ML injection   Commonly known as: LOVENOX   Inject 0.4 mLs (40 mg total) into the skin daily. Continue Lovenox injections until the INR is therapeutic at or greater than 2.0.  When INR reaches the therapeutic level, may discontinue the Lovenox injections.      Iron 325 (65 FE) MG Tabs   Take 65 mg by mouth 2 (two) times a week.      labetalol 300 MG tablet   Commonly known as: NORMODYNE   Take 300 mg by mouth 2 (two) times daily.      oxycodone 5 MG capsule   Commonly known as: OXY-IR   Take 1-2 capsules (5-10 mg total) by mouth every 4 (four) hours as needed. Pain      POTASSIUM PO   Take 1 tablet by mouth See admin instructions. Pt buys the over the counter and takes it daily      quinapril-hydrochlorothiazide 20-12.5 MG per tablet   Commonly known as: ACCURETIC   Take 1 tablet by mouth every morning.      triamcinolone cream 0.1 %   Commonly known as: KENALOG   Apply 1 application topically 2 (two) times daily as needed. Rash      warfarin 5 MG tablet   Commonly known as: COUMADIN   Take 1 tablet (5 mg total) by mouth one time only at 6 PM. Take Coumadin for three weeks and then discontinue.  The dose may need to be adjusted based upon the INR.  Please follow the INR and titrate Coumadin dose for a therapeutic range between 2.0 and 3.0 INR.  After completing the three weeks of Coumadin, the patient may stop the Coumadin and resume their 81 mg Aspirin daily.           Follow-up Information    Schedule an appointment as soon as possible for a visit with Loanne Drilling, MD.   Contact information:   [facility] [address], [address] [place] [address] [phone]          Signed: Patrica Duel 10/23/2011, 7:48 PM

## 2011-10-25 ENCOUNTER — Encounter: Payer: Self-pay | Admitting: Infectious Diseases

## 2011-10-25 DIAGNOSIS — J449 Chronic obstructive pulmonary disease, unspecified: Secondary | ICD-10-CM | POA: Diagnosis not present

## 2011-10-25 DIAGNOSIS — Z452 Encounter for adjustment and management of vascular access device: Secondary | ICD-10-CM | POA: Diagnosis not present

## 2011-10-25 DIAGNOSIS — Z4789 Encounter for other orthopedic aftercare: Secondary | ICD-10-CM | POA: Diagnosis not present

## 2011-10-25 DIAGNOSIS — E119 Type 2 diabetes mellitus without complications: Secondary | ICD-10-CM | POA: Diagnosis not present

## 2011-10-25 DIAGNOSIS — Z96659 Presence of unspecified artificial knee joint: Secondary | ICD-10-CM | POA: Diagnosis not present

## 2011-10-29 DIAGNOSIS — Z452 Encounter for adjustment and management of vascular access device: Secondary | ICD-10-CM | POA: Diagnosis not present

## 2011-10-29 DIAGNOSIS — Z96659 Presence of unspecified artificial knee joint: Secondary | ICD-10-CM | POA: Diagnosis not present

## 2011-10-29 DIAGNOSIS — Z792 Long term (current) use of antibiotics: Secondary | ICD-10-CM | POA: Diagnosis not present

## 2011-10-29 DIAGNOSIS — E119 Type 2 diabetes mellitus without complications: Secondary | ICD-10-CM | POA: Diagnosis not present

## 2011-10-29 DIAGNOSIS — J449 Chronic obstructive pulmonary disease, unspecified: Secondary | ICD-10-CM | POA: Diagnosis not present

## 2011-10-29 DIAGNOSIS — Z4789 Encounter for other orthopedic aftercare: Secondary | ICD-10-CM | POA: Diagnosis not present

## 2011-10-29 DIAGNOSIS — L089 Local infection of the skin and subcutaneous tissue, unspecified: Secondary | ICD-10-CM | POA: Diagnosis not present

## 2011-10-31 DIAGNOSIS — Z4789 Encounter for other orthopedic aftercare: Secondary | ICD-10-CM | POA: Diagnosis not present

## 2011-10-31 DIAGNOSIS — J449 Chronic obstructive pulmonary disease, unspecified: Secondary | ICD-10-CM | POA: Diagnosis not present

## 2011-10-31 DIAGNOSIS — E119 Type 2 diabetes mellitus without complications: Secondary | ICD-10-CM | POA: Diagnosis not present

## 2011-10-31 DIAGNOSIS — Z96659 Presence of unspecified artificial knee joint: Secondary | ICD-10-CM | POA: Diagnosis not present

## 2011-10-31 DIAGNOSIS — Z452 Encounter for adjustment and management of vascular access device: Secondary | ICD-10-CM | POA: Diagnosis not present

## 2011-11-05 DIAGNOSIS — Z792 Long term (current) use of antibiotics: Secondary | ICD-10-CM | POA: Diagnosis not present

## 2011-11-05 DIAGNOSIS — Z452 Encounter for adjustment and management of vascular access device: Secondary | ICD-10-CM | POA: Diagnosis not present

## 2011-11-05 DIAGNOSIS — E119 Type 2 diabetes mellitus without complications: Secondary | ICD-10-CM | POA: Diagnosis not present

## 2011-11-05 DIAGNOSIS — Z4789 Encounter for other orthopedic aftercare: Secondary | ICD-10-CM | POA: Diagnosis not present

## 2011-11-05 DIAGNOSIS — Z96659 Presence of unspecified artificial knee joint: Secondary | ICD-10-CM | POA: Diagnosis not present

## 2011-11-05 DIAGNOSIS — J449 Chronic obstructive pulmonary disease, unspecified: Secondary | ICD-10-CM | POA: Diagnosis not present

## 2011-11-06 ENCOUNTER — Encounter: Payer: Self-pay | Admitting: Infectious Diseases

## 2011-11-06 DIAGNOSIS — E119 Type 2 diabetes mellitus without complications: Secondary | ICD-10-CM | POA: Diagnosis not present

## 2011-11-06 DIAGNOSIS — J449 Chronic obstructive pulmonary disease, unspecified: Secondary | ICD-10-CM | POA: Diagnosis not present

## 2011-11-06 DIAGNOSIS — Z4789 Encounter for other orthopedic aftercare: Secondary | ICD-10-CM | POA: Diagnosis not present

## 2011-11-06 DIAGNOSIS — Z96659 Presence of unspecified artificial knee joint: Secondary | ICD-10-CM | POA: Diagnosis not present

## 2011-11-06 DIAGNOSIS — Z452 Encounter for adjustment and management of vascular access device: Secondary | ICD-10-CM | POA: Diagnosis not present

## 2011-11-08 DIAGNOSIS — Z452 Encounter for adjustment and management of vascular access device: Secondary | ICD-10-CM | POA: Diagnosis not present

## 2011-11-08 DIAGNOSIS — Z96659 Presence of unspecified artificial knee joint: Secondary | ICD-10-CM | POA: Diagnosis not present

## 2011-11-08 DIAGNOSIS — Z4789 Encounter for other orthopedic aftercare: Secondary | ICD-10-CM | POA: Diagnosis not present

## 2011-11-08 DIAGNOSIS — E119 Type 2 diabetes mellitus without complications: Secondary | ICD-10-CM | POA: Diagnosis not present

## 2011-11-08 DIAGNOSIS — J449 Chronic obstructive pulmonary disease, unspecified: Secondary | ICD-10-CM | POA: Diagnosis not present

## 2011-11-12 DIAGNOSIS — M48061 Spinal stenosis, lumbar region without neurogenic claudication: Secondary | ICD-10-CM | POA: Diagnosis not present

## 2011-11-12 DIAGNOSIS — J449 Chronic obstructive pulmonary disease, unspecified: Secondary | ICD-10-CM | POA: Diagnosis not present

## 2011-11-12 DIAGNOSIS — Z792 Long term (current) use of antibiotics: Secondary | ICD-10-CM | POA: Diagnosis not present

## 2011-11-12 DIAGNOSIS — I10 Essential (primary) hypertension: Secondary | ICD-10-CM | POA: Diagnosis not present

## 2011-11-12 DIAGNOSIS — Z96659 Presence of unspecified artificial knee joint: Secondary | ICD-10-CM | POA: Diagnosis not present

## 2011-11-12 DIAGNOSIS — E119 Type 2 diabetes mellitus without complications: Secondary | ICD-10-CM | POA: Diagnosis not present

## 2011-11-12 DIAGNOSIS — Z452 Encounter for adjustment and management of vascular access device: Secondary | ICD-10-CM | POA: Diagnosis not present

## 2011-11-12 DIAGNOSIS — Z4789 Encounter for other orthopedic aftercare: Secondary | ICD-10-CM | POA: Diagnosis not present

## 2011-11-13 ENCOUNTER — Ambulatory Visit (INDEPENDENT_AMBULATORY_CARE_PROVIDER_SITE_OTHER): Payer: Medicare Other | Admitting: Infectious Diseases

## 2011-11-13 ENCOUNTER — Encounter: Payer: Self-pay | Admitting: Infectious Diseases

## 2011-11-13 ENCOUNTER — Telehealth: Payer: Self-pay | Admitting: *Deleted

## 2011-11-13 VITALS — BP 121/76 | HR 81 | Temp 97.8°F | Ht 65.0 in | Wt 185.0 lb

## 2011-11-13 DIAGNOSIS — M009 Pyogenic arthritis, unspecified: Secondary | ICD-10-CM | POA: Diagnosis not present

## 2011-11-13 MED ORDER — FLUCONAZOLE 100 MG PO TABS: 100.0000 mg | ORAL_TABLET | Freq: Every day | ORAL | Status: DC

## 2011-11-13 MED ORDER — RIFAMPIN 300 MG PO CAPS: 300.0000 mg | ORAL_CAPSULE | Freq: Every day | ORAL | Status: DC

## 2011-11-13 MED ORDER — METHOCARBAMOL 500 MG PO TABS: 500.0000 mg | ORAL_TABLET | Freq: Three times a day (TID) | ORAL | Status: AC

## 2011-11-13 NOTE — Assessment & Plan Note (Signed)
Would plan to continue her anbx until she has her surgery (as it will be close to when her completion date). Will f/u her CRP, ESR. Will refill her rifampin, discuss with gentiva about continuing her ancef.  Start her on diflucan for her candidiasis, hopefully will help her onychomycosis. Could consider terbinafine after surgery.  Will plan to see her at the time of surgery. Intra-operative cx's would be very helpful.

## 2011-11-13 NOTE — Telephone Encounter (Signed)
Called [facility] in [place] [phone] and gave verbal orders per Dr. Ninetta Lights to continue IV antibiotics through 12/04/11. Also to fax all labs to this clinic. Wendall Mola CMA

## 2011-11-13 NOTE — Progress Notes (Signed)
  Subjective:    Patient ID: [name], female    DOB: September 19, 1940, 71 y.o.   MRN: [id number]  HPI 71 yo F with hx as listed below:  Jul 12, 2002 - Right UNI Knee Replacement  September 20, 2002 - Left UNI Knee Replacement  August 09, 2009 - Conversion of Left UNI to Left Total Knee Replacement  Feb. 2007 - Right UNI removed, placement of Antibiotic Spacer due to positive cultures.  6 Weeks Later - Removal Spacer, Right Total Knee Replacement  Nov 07, 2010 - Revision of Right Total Knee Arthroplasty  April 07, 2011 - Presented to the ED for tender, swollen knee. Aspirated by ED and found to have septic total knee. Gram positive Cocci.  April 10, 2011 - Right Knee Arthrotomy, I & D Knee, Poly Exchange. Cx MSSA  Initially improved from the washout during the postoperative follow up. She was treated with 30 days of IV anbx at SNF at that time.  (Swelling reoccurred in December and worsened with time.)  January 2013 - Seen in office and aspirated - placed on Doxycycline. Aspirate found to be positive of Staph aureus and switched to Bactrim DS  August 09, 2011 - Seen in office for increasing complaints - ordered Bone Scan which proved to be positive for loosening and/or infection.  She returned to hospital 4-21 and underwent resection of R TKR and placement of antibiotic spacer on 10-02-11. Her Cx at that time grew MSSA (r-pen) and she was started on ancef and rifampin. She was d/c home on 4-29 with PIC line.  Today with complaints of continued pain in her knee. Worse with standing and walking. No increase in heat. No erythema. Feels like swelling is more distal, she attributes this to the wraps she is using.  No fever or chills.  Planned for new R TKR on 6-26, continues on IV anbx until 1 week prior to surgery.  Today is day 45 of IV anbx. [name].    Review of Systems  Constitutional: Negative for fever and chills.  Respiratory: Negative for cough and shortness of breath.   Gastrointestinal:  Negative for diarrhea and constipation.  Genitourinary: Negative for dysuria.  no oral ulcers, has had dermatitis under her abd.      Objective:   Physical Exam  Constitutional: She appears well-developed and well-nourished.  HENT:  Mouth/Throat: No oropharyngeal exudate.  Eyes: EOM are normal. Pupils are equal, round, and reactive to light.  Neck: Neck supple.  Cardiovascular: Normal rate, regular rhythm and normal heart sounds.   Pulmonary/Chest: Effort normal and breath sounds normal. No respiratory distress.  Abdominal: Soft. Bowel sounds are normal. She exhibits no distension. There is no tenderness.  Musculoskeletal:       Legs: Lymphadenopathy:    She has no cervical adenopathy.  Skin:       She has wetness and erythema of her skin within her abdominal pannus.           Assessment & Plan:

## 2011-11-14 ENCOUNTER — Telehealth: Payer: Self-pay | Admitting: *Deleted

## 2011-11-14 DIAGNOSIS — Z96659 Presence of unspecified artificial knee joint: Secondary | ICD-10-CM | POA: Diagnosis not present

## 2011-11-14 DIAGNOSIS — E119 Type 2 diabetes mellitus without complications: Secondary | ICD-10-CM | POA: Diagnosis not present

## 2011-11-14 DIAGNOSIS — Z4789 Encounter for other orthopedic aftercare: Secondary | ICD-10-CM | POA: Diagnosis not present

## 2011-11-14 DIAGNOSIS — J449 Chronic obstructive pulmonary disease, unspecified: Secondary | ICD-10-CM | POA: Diagnosis not present

## 2011-11-14 DIAGNOSIS — Z452 Encounter for adjustment and management of vascular access device: Secondary | ICD-10-CM | POA: Diagnosis not present

## 2011-11-14 NOTE — Telephone Encounter (Signed)
RN reviewed MD OV note from 11/13/11.  Read plan to pharmacist to continue IV antibiotics until the pt returns for her next surgery.  Pharmacist acknowledged the plan and will contact Dr. Para March office.

## 2011-11-19 DIAGNOSIS — E119 Type 2 diabetes mellitus without complications: Secondary | ICD-10-CM | POA: Diagnosis not present

## 2011-11-19 DIAGNOSIS — Z4789 Encounter for other orthopedic aftercare: Secondary | ICD-10-CM | POA: Diagnosis not present

## 2011-11-19 DIAGNOSIS — Z452 Encounter for adjustment and management of vascular access device: Secondary | ICD-10-CM | POA: Diagnosis not present

## 2011-11-19 DIAGNOSIS — Z96659 Presence of unspecified artificial knee joint: Secondary | ICD-10-CM | POA: Diagnosis not present

## 2011-11-19 DIAGNOSIS — J449 Chronic obstructive pulmonary disease, unspecified: Secondary | ICD-10-CM | POA: Diagnosis not present

## 2011-11-19 DIAGNOSIS — Z792 Long term (current) use of antibiotics: Secondary | ICD-10-CM | POA: Diagnosis not present

## 2011-11-21 ENCOUNTER — Encounter (HOSPITAL_COMMUNITY): Payer: Self-pay | Admitting: Pharmacy Technician

## 2011-11-26 ENCOUNTER — Other Ambulatory Visit: Payer: Self-pay

## 2011-11-26 ENCOUNTER — Encounter (HOSPITAL_COMMUNITY): Payer: Self-pay

## 2011-11-26 ENCOUNTER — Encounter (HOSPITAL_COMMUNITY)
Admission: RE | Admit: 2011-11-26 | Discharge: 2011-11-26 | Disposition: A | Discharge status: Home / Self Care | Payer: Medicare Other | Source: Ambulatory Visit | Attending: Orthopedic Surgery | Admitting: Orthopedic Surgery

## 2011-11-26 DIAGNOSIS — I10 Essential (primary) hypertension: Secondary | ICD-10-CM | POA: Diagnosis not present

## 2011-11-26 DIAGNOSIS — D62 Acute posthemorrhagic anemia: Secondary | ICD-10-CM | POA: Diagnosis not present

## 2011-11-26 DIAGNOSIS — M21869 Other specified acquired deformities of unspecified lower leg: Secondary | ICD-10-CM | POA: Diagnosis not present

## 2011-11-26 DIAGNOSIS — E119 Type 2 diabetes mellitus without complications: Secondary | ICD-10-CM | POA: Diagnosis not present

## 2011-11-26 DIAGNOSIS — Z96659 Presence of unspecified artificial knee joint: Secondary | ICD-10-CM | POA: Diagnosis not present

## 2011-11-26 DIAGNOSIS — J449 Chronic obstructive pulmonary disease, unspecified: Secondary | ICD-10-CM | POA: Diagnosis not present

## 2011-11-26 DIAGNOSIS — Z01812 Encounter for preprocedural laboratory examination: Secondary | ICD-10-CM | POA: Diagnosis not present

## 2011-11-26 DIAGNOSIS — Z4789 Encounter for other orthopedic aftercare: Secondary | ICD-10-CM | POA: Diagnosis not present

## 2011-11-26 DIAGNOSIS — Z452 Encounter for adjustment and management of vascular access device: Secondary | ICD-10-CM | POA: Diagnosis not present

## 2011-11-26 LAB — COMPREHENSIVE METABOLIC PANEL
AST: 18 U/L (ref 0–37)
Albumin: 3.9 g/dL (ref 3.5–5.2)
Alkaline Phosphatase: 107 U/L (ref 39–117)
BUN: 15 mg/dL (ref 6–23)
Chloride: 100 mEq/L (ref 96–112)
Potassium: 4 mEq/L (ref 3.5–5.1)
Total Bilirubin: 0.1 mg/dL — ABNORMAL LOW (ref 0.3–1.2)

## 2011-11-26 LAB — CBC
HCT: 39.1 % (ref 36.0–46.0)
RBC: 4.46 MIL/uL (ref 3.87–5.11)
RDW: 18.7 % — ABNORMAL HIGH (ref 11.5–15.5)
WBC: 6.1 10*3/uL (ref 4.0–10.5)

## 2011-11-26 LAB — PROTIME-INR: Prothrombin Time: 13.6 seconds (ref 11.6–15.2)

## 2011-11-26 LAB — URINALYSIS, ROUTINE W REFLEX MICROSCOPIC
Glucose, UA: NEGATIVE mg/dL
Leukocytes, UA: NEGATIVE
pH: 5.5 (ref 5.0–8.0)

## 2011-11-26 LAB — APTT: aPTT: 27 seconds (ref 24–37)

## 2011-11-26 NOTE — Pre-Procedure Instructions (Signed)
Reviewed pre-op instructions to patient using Teach back method.

## 2011-11-26 NOTE — Patient Instructions (Addendum)
20 [name]  11/26/2011   Your procedure is scheduled on:  Wednesday 12/04/2011 at 430pm  Report to [facility] at 200 pm  Call this number if you have problems the morning of surgery: [phone]   Remember:   Do not eat food:After Midnight.  May have clear liquids: up to 4 Hours before arrival-  MAY HAVE CLEAR LIQUIDS FROM MIDNIGHT UP UNTIL 1030AM MORNING OF SURGERY AND THEN NOTHING UNTIL AFTER SURGERY!  Clear liquids include soda, tea, black coffee, apple or grape juice, broth.  Take these medicines the morning of surgery with A SIP OF WATER: Labetalol,  Ask Dr.Aluisio about Diflucan and Rifampin   Do not wear jewelry, make-up or nail polish.  Do not wear lotions, powders, or perfumes.   Do not shave 48 hours prior to surgery. Men may shave face and neck.  Do not bring valuables to the hospital.  Contacts, dentures or bridgework may not be worn into surgery.  Leave suitcase in the car. After surgery it may be brought to your room.  For patients admitted to the hospital, checkout time is 11:00 AM the day of discharge.       Special Instructions: CHG Shower Use Special Wash: 1/2 bottle night before surgery and 1/2 bottle morning of surgery.   Please read over the following fact sheets that you were given: MRSA Information, Sleep apnea sheet, Incentive Spirometry Sheet, Blood Fact sheet

## 2011-11-27 ENCOUNTER — Encounter: Payer: Self-pay | Admitting: Infectious Diseases

## 2011-12-02 DIAGNOSIS — T8450XA Infection and inflammatory reaction due to unspecified internal joint prosthesis, initial encounter: Secondary | ICD-10-CM | POA: Diagnosis not present

## 2011-12-03 NOTE — H&P (Signed)
[name] DOB: [birth year]  Chief complaint: right knee pain  History of Present Illness The patient is a 71 year old female who comes in today for a preoperative History and Physical. The patient is scheduled for a right total knee reimplantation to be performed by Dr. Gus Rankin. Aluisio, MD at [facility] on Wednesday December 04, 2011. [name] has been through multiple right knee surgeries starting with the initial right total knee in 2004. She has had several I&Ds, revision, and poly exchange. She is about 8 weeks out from right total knee resection with antibiotic space placement. She has been getting antibiotics through the PICC line as well, specifically Cefazolin.      Problem List/Past Medical Diet-Controlled Diabetes Mellitus Hypercholesterolemia Skin Cancer. 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 10/29/2002 Pain in joint, lower leg (719.46). 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 08/03/2003 Osteoarthrosis NOS, lower leg (715.96). 04/19/2010 Mech cmpl intrn orth dev/implant/graft NEC (996.49). 11/20/2010 Hypertension Gallbladder Problems   Allergies No Known Drug Allergies.   Family History Heart disease in female family member before age 26 Cancer. mother Heart disease in female family member before age 68 Diabetes Mellitus. mother Hypertension. mother Chronic Obstructive Lung Disease. mother Drug / Alcohol Addiction. mother Heart Disease. mother Father. deceased age 37 due to MI Mother. deceased age 20 due to cancer   Social History Exercise. Exercises weekly; does other Drug/Alcohol Rehab  (Currently). no Current work status. disabled Non drinker / no alcohol Use Alcohol use. Never consumed alcohol. Living situation. live with spouse Tobacco / smoke exposure. yes outdoors only Pain Contract. no Number of flights of stairs before winded. less than 1 Tobacco use. Never smoker. Children. 3 Marital status. married Alcohol use. never consumed alcohol Illicit drug use. no Non smoker / no tobacco use Drug/Alcohol Rehab (Previously). no Tobacco use. never smoker Post-Surgical Plans. going home Advance Directives. living will   Medication History OxyCODONE HCl (5MG  Tablet, 1-2 Oral po q 6-8hrs prn pain Active. Red Yeast Rice (600MG  Tablet, Oral) Active. Fish Oil Concentrate (435MG  Capsule, 1 Oral) Active. Garlic ( Oral) Active. Triamcinolone Acetonide (0.1% Ointment, External) Active. Fluorometholone (0.1% Suspension, Ophthalmic) Active. Multiple Vitamins/Womens (1 Oral) Active. Quinapril-Hydrochlorothiazide (20-12.5MG  Tablet, Oral) Active. Labetalol HCl (300MG  Tablet, 1 Oral two times daily) Active. Rifampin (300MG  Capsule, Oral three times daily) Active. Robaxin (500MG  Tablet, Oral three times daily, as needed) Active. Klor-Con M20 ( Tablet ER, Oral daily) Active. Fluconazole (100MG  Tablet, Oral daily) Active. Aspirin EC (81MG  Tablet DR, Oral) Active.   Past Surgical History Carpal Tunnel Repair. left Breast Reconstruction. left Total Knee Replacement. bilateral 2004 right knee revision- 2007, 2009, 2011, 2012 Discectomy. Nov 2012    Review of Systems General:Not Present- Chills, Fever, Night Sweats, Fatigue, Weight Gain, Weight Loss and Memory Loss. Skin:Not Present- Hives, Itching, Rash, Eczema and Lesions. HEENT:Not Present- Tinnitus, Headache, Double Vision, Visual Loss, Hearing Loss and Dentures. Respiratory:Present- Shortness of breath with exertion. Not Present- Shortness of breath at rest, Allergies, Coughing up  blood and Chronic Cough. Cardiovascular:Not Present- Chest Pain, Racing/skipping heartbeats, Difficulty Breathing Lying Down, Murmur, Swelling and Palpitations. Gastrointestinal:Not Present- Bloody Stool, Heartburn, Abdominal Pain, Vomiting, Nausea, Constipation, Diarrhea, Difficulty Swallowing, Jaundice and Loss of appetitie. Female Genitourinary:Not Present- Blood in Urine, Urinary frequency, Weak  urinary stream, Discharge, Flank Pain, Incontinence, Painful Urination, Urgency, Urinary Retention and Urinating at Night. Musculoskeletal:Present- Joint Pain and Back Pain. Not Present- Muscle Weakness, Muscle Pain, Joint Swelling, Morning Stiffness and Spasms. Neurological:Not Present- Tremor, Dizziness, Blackout spells, Paralysis, Difficulty with balance and Weakness. Psychiatric:Not Present- Insomnia.   Vitals Weight: 185 lb Height: 65.5 in Body Surface Area: 1.97 m Body Mass Index: 30.32 kg/m Pulse: 72 (Regular) Resp.: 16 (Unlabored) BP: 118/68 (Sitting, Left Arm, Standard)    Physical Exam General Mental Status - Alert, cooperative and good historian. General Appearance- pleasant. Not in acute distress. Orientation- Oriented X3. Build & Nutrition- Well nourished and Well developed. Head and Neck Head- normocephalic, atraumatic . NeckNote: Sternoclavicular joint on right tender Global Assessment- supple. no bruit auscultated on the right and no bruit auscultated on the left. Eye Pupil- Bilateral- Regular and Round. Motion- Bilateral- EOMI. Chest and Lung Exam Auscultation: Breath sounds:- clear at anterior chest wall and - clear at posterior chest wall. Adventitious sounds:- No Adventitious sounds. Cardiovascular Auscultation:Rhythm- Regular rate and rhythm. Heart Sounds- S1 WNL and S2 WNL. Murmurs & Other Heart Sounds:Auscultation of the heart reveals - No Murmurs. Abdomen Palpation/Percussion:Tenderness- Abdomen is non-tender to  palpation. Rigidity (guarding)- Abdomen is soft. Auscultation:Auscultation of the abdomen reveals - Bowel sounds normal. Female Genitourinary Not done, not pertinent to present illness Peripheral Vascular Upper Extremity: Palpation:- Pulses bilaterally normal. Lower Extremity: Palpation:- Pulses bilaterally normal. Neurologic Examination of related systems reveals - normal muscle strength and tone in all extremities. Neurologic evaluation reveals - normal sensation and upper and lower extremity deep tendon reflexes intact bilaterally . Musculoskeletal Right knee in knee immobilizer. Dorsiflexion and plantar flexion intact in LE. Incisions healed, with no signs of infection. PICC line in place in right arm    Assessment & Plan Mech cmpl intrn orth dev/implant/graft NEC (996.49) S/P right knee arthroplasty resection with antibiotic spacer placement Scheduled for right total knee reimplantation     Dimitri Ped, PA-C

## 2011-12-04 ENCOUNTER — Encounter (HOSPITAL_COMMUNITY): Payer: Self-pay | Admitting: *Deleted

## 2011-12-04 ENCOUNTER — Encounter (HOSPITAL_COMMUNITY)
Admission: RE | Disposition: A | Discharge status: Home Health | Payer: Self-pay | Source: Ambulatory Visit | Attending: Orthopedic Surgery

## 2011-12-04 ENCOUNTER — Ambulatory Visit (HOSPITAL_COMMUNITY): Payer: Medicare Other | Admitting: *Deleted

## 2011-12-04 ENCOUNTER — Inpatient Hospital Stay (HOSPITAL_COMMUNITY)
Admission: RE | Admit: 2011-12-04 | Discharge: 2011-12-07 | DRG: 467 | Disposition: A | Discharge status: Home Health | Payer: Medicare Other | Source: Ambulatory Visit | Attending: Orthopedic Surgery | Admitting: Orthopedic Surgery

## 2011-12-04 DIAGNOSIS — I10 Essential (primary) hypertension: Secondary | ICD-10-CM | POA: Diagnosis not present

## 2011-12-04 DIAGNOSIS — J449 Chronic obstructive pulmonary disease, unspecified: Secondary | ICD-10-CM | POA: Diagnosis present

## 2011-12-04 DIAGNOSIS — Z01812 Encounter for preprocedural laboratory examination: Secondary | ICD-10-CM

## 2011-12-04 DIAGNOSIS — M21869 Other specified acquired deformities of unspecified lower leg: Secondary | ICD-10-CM | POA: Diagnosis not present

## 2011-12-04 DIAGNOSIS — Z96659 Presence of unspecified artificial knee joint: Secondary | ICD-10-CM

## 2011-12-04 DIAGNOSIS — T8450XA Infection and inflammatory reaction due to unspecified internal joint prosthesis, initial encounter: Secondary | ICD-10-CM | POA: Diagnosis not present

## 2011-12-04 DIAGNOSIS — J4489 Other specified chronic obstructive pulmonary disease: Secondary | ICD-10-CM | POA: Diagnosis not present

## 2011-12-04 DIAGNOSIS — T84099A Other mechanical complication of unspecified internal joint prosthesis, initial encounter: Secondary | ICD-10-CM | POA: Diagnosis not present

## 2011-12-04 DIAGNOSIS — E119 Type 2 diabetes mellitus without complications: Secondary | ICD-10-CM | POA: Diagnosis present

## 2011-12-04 DIAGNOSIS — G5600 Carpal tunnel syndrome, unspecified upper limb: Secondary | ICD-10-CM | POA: Diagnosis not present

## 2011-12-04 DIAGNOSIS — D62 Acute posthemorrhagic anemia: Secondary | ICD-10-CM | POA: Diagnosis not present

## 2011-12-04 HISTORY — PX: TOTAL KNEE REVISION: SHX996

## 2011-12-04 LAB — GRAM STAIN

## 2011-12-04 LAB — GLUCOSE, CAPILLARY: Glucose-Capillary: 92 mg/dL (ref 70–99)

## 2011-12-04 LAB — TYPE AND SCREEN
ABO/RH(D): A NEG
Antibody Screen: NEGATIVE

## 2011-12-04 SURGERY — TOTAL KNEE REVISION
Anesthesia: General | Site: Knee | Laterality: Right | Wound class: Clean

## 2011-12-04 MED ORDER — DEXAMETHASONE SODIUM PHOSPHATE 4 MG/ML IJ SOLN
INTRAMUSCULAR | Status: DC | PRN
  Administered 2011-12-04: 10 mg via INTRAVENOUS

## 2011-12-04 MED ORDER — HYDRALAZINE HCL 20 MG/ML IJ SOLN
INTRAMUSCULAR | Status: DC | PRN
  Administered 2011-12-04: 4 mg via INTRAVENOUS

## 2011-12-04 MED ORDER — LABETALOL HCL 300 MG PO TABS
300.0000 mg | ORAL_TABLET | Freq: Once | ORAL | Status: AC
  Administered 2011-12-04: 300 mg via ORAL
  Filled 2011-12-04: qty 1

## 2011-12-04 MED ORDER — ACETAMINOPHEN 10 MG/ML IV SOLN
1000.0000 mg | Freq: Four times a day (QID) | INTRAVENOUS | Status: AC
  Administered 2011-12-04 – 2011-12-05 (×4): 1000 mg via INTRAVENOUS
  Filled 2011-12-04 (×4): qty 100

## 2011-12-04 MED ORDER — MORPHINE SULFATE (PF) 1 MG/ML IV SOLN
INTRAVENOUS | Status: DC
  Administered 2011-12-04: 19:00:00 via INTRAVENOUS
  Administered 2011-12-04: 9 mg via INTRAVENOUS
  Administered 2011-12-05: 8 mg via INTRAVENOUS
  Administered 2011-12-05: 2 mg via INTRAVENOUS

## 2011-12-04 MED ORDER — ONDANSETRON HCL 4 MG/2ML IJ SOLN
INTRAMUSCULAR | Status: DC | PRN
  Administered 2011-12-04: 4 mg via INTRAVENOUS

## 2011-12-04 MED ORDER — DOCUSATE SODIUM 100 MG PO CAPS
100.0000 mg | ORAL_CAPSULE | Freq: Two times a day (BID) | ORAL | Status: DC
  Administered 2011-12-04 – 2011-12-07 (×6): 100 mg via ORAL

## 2011-12-04 MED ORDER — TRIAMCINOLONE ACETONIDE 0.1 % EX CREA
1.0000 "application " | TOPICAL_CREAM | Freq: Two times a day (BID) | CUTANEOUS | Status: DC | PRN
  Filled 2011-12-04: qty 15

## 2011-12-04 MED ORDER — ENOXAPARIN SODIUM 30 MG/0.3ML ~~LOC~~ SOLN
30.0000 mg | Freq: Two times a day (BID) | SUBCUTANEOUS | Status: DC
  Administered 2011-12-05 – 2011-12-07 (×5): 30 mg via SUBCUTANEOUS
  Filled 2011-12-04 (×7): qty 0.3

## 2011-12-04 MED ORDER — POTASSIUM CHLORIDE CRYS ER 20 MEQ PO TBCR
20.0000 meq | EXTENDED_RELEASE_TABLET | Freq: Every day | ORAL | Status: DC
  Administered 2011-12-04 – 2011-12-07 (×3): 20 meq via ORAL
  Filled 2011-12-04 (×4): qty 1

## 2011-12-04 MED ORDER — RIFAMPIN 300 MG PO CAPS
300.0000 mg | ORAL_CAPSULE | Freq: Every day | ORAL | Status: DC
  Administered 2011-12-04 – 2011-12-06 (×3): 300 mg via ORAL
  Filled 2011-12-04 (×4): qty 1

## 2011-12-04 MED ORDER — SODIUM CHLORIDE 0.9 % IV SOLN
INTRAVENOUS | Status: DC
  Administered 2011-12-04 – 2011-12-05 (×3): via INTRAVENOUS

## 2011-12-04 MED ORDER — ACETAMINOPHEN 10 MG/ML IV SOLN
1000.0000 mg | Freq: Once | INTRAVENOUS | Status: DC
  Filled 2011-12-04: qty 100

## 2011-12-04 MED ORDER — ACETAMINOPHEN 325 MG PO TABS: 650.0000 mg | ORAL_TABLET | Freq: Four times a day (QID) | ORAL | Status: DC | PRN

## 2011-12-04 MED ORDER — CEFAZOLIN SODIUM 1-5 GM-% IV SOLN
1.0000 g | Freq: Four times a day (QID) | INTRAVENOUS | Status: AC
  Administered 2011-12-04 – 2011-12-06 (×8): 1 g via INTRAVENOUS
  Filled 2011-12-04 (×8): qty 50

## 2011-12-04 MED ORDER — ACETAMINOPHEN 650 MG RE SUPP: 650.0000 mg | Freq: Four times a day (QID) | RECTAL | Status: DC | PRN

## 2011-12-04 MED ORDER — BUPIVACAINE ON-Q PAIN PUMP (FOR ORDER SET NO CHG)
INJECTION | Status: DC
  Filled 2011-12-04: qty 1

## 2011-12-04 MED ORDER — KETAMINE HCL 10 MG/ML IJ SOLN
INTRAMUSCULAR | Status: DC | PRN
  Administered 2011-12-04: 5 mg via INTRAVENOUS
  Administered 2011-12-04: 2 mg via INTRAVENOUS
  Administered 2011-12-04 (×2): 5 mg via INTRAVENOUS
  Administered 2011-12-04: 25 mg via INTRAVENOUS
  Administered 2011-12-04: 5 mg via INTRAVENOUS
  Administered 2011-12-04: 2 mg via INTRAVENOUS
  Administered 2011-12-04: 1 mg via INTRAVENOUS

## 2011-12-04 MED ORDER — NEOSTIGMINE METHYLSULFATE 1 MG/ML IJ SOLN
INTRAMUSCULAR | Status: DC | PRN
  Administered 2011-12-04: 2 mg via INTRAVENOUS

## 2011-12-04 MED ORDER — LABETALOL HCL 300 MG PO TABS
300.0000 mg | ORAL_TABLET | Freq: Two times a day (BID) | ORAL | Status: DC
  Administered 2011-12-04 – 2011-12-07 (×5): 300 mg via ORAL
  Filled 2011-12-04 (×7): qty 1

## 2011-12-04 MED ORDER — COUMADIN BOOK
1.0000 | Freq: Once | Status: AC
  Administered 2011-12-05: 1
  Filled 2011-12-04: qty 1

## 2011-12-04 MED ORDER — DIPHENHYDRAMINE HCL 12.5 MG/5ML PO ELIX
12.5000 mg | ORAL_SOLUTION | Freq: Four times a day (QID) | ORAL | Status: DC | PRN
  Filled 2011-12-04: qty 5

## 2011-12-04 MED ORDER — ONDANSETRON HCL 4 MG/2ML IJ SOLN: 4.0000 mg | Freq: Four times a day (QID) | INTRAMUSCULAR | Status: DC | PRN

## 2011-12-04 MED ORDER — WARFARIN VIDEO
Freq: Once | Status: AC
  Administered 2011-12-05: 12:00:00

## 2011-12-04 MED ORDER — FENTANYL CITRATE 0.05 MG/ML IJ SOLN
INTRAMUSCULAR | Status: DC | PRN
  Administered 2011-12-04 (×3): 50 ug via INTRAVENOUS
  Administered 2011-12-04 (×2): 25 ug via INTRAVENOUS
  Administered 2011-12-04: 12.5 ug via INTRAVENOUS
  Administered 2011-12-04 (×2): 50 ug via INTRAVENOUS
  Administered 2011-12-04: 12.5 ug via INTRAVENOUS
  Administered 2011-12-04: 25 ug via INTRAVENOUS
  Administered 2011-12-04: 50 ug via INTRAVENOUS
  Administered 2011-12-04: 25 ug via INTRAVENOUS
  Administered 2011-12-04: 50 ug via INTRAVENOUS

## 2011-12-04 MED ORDER — ROCURONIUM BROMIDE 100 MG/10ML IV SOLN
INTRAVENOUS | Status: DC | PRN
  Administered 2011-12-04: 50 mg via INTRAVENOUS

## 2011-12-04 MED ORDER — WARFARIN SODIUM 5 MG PO TABS
5.0000 mg | ORAL_TABLET | ORAL | Status: AC
  Administered 2011-12-05: 5 mg via ORAL
  Filled 2011-12-04: qty 1

## 2011-12-04 MED ORDER — ONDANSETRON HCL 4 MG PO TABS: 4.0000 mg | ORAL_TABLET | Freq: Four times a day (QID) | ORAL | Status: DC | PRN

## 2011-12-04 MED ORDER — HYDRALAZINE HCL 20 MG/ML IJ SOLN
INTRAMUSCULAR | Status: AC
  Filled 2011-12-04: qty 1

## 2011-12-04 MED ORDER — METHOCARBAMOL 500 MG PO TABS
500.0000 mg | ORAL_TABLET | Freq: Four times a day (QID) | ORAL | Status: DC | PRN
  Administered 2011-12-05 – 2011-12-07 (×5): 500 mg via ORAL
  Filled 2011-12-04 (×5): qty 1

## 2011-12-04 MED ORDER — SODIUM CHLORIDE 0.9 % IV SOLN: INTRAVENOUS | Status: DC

## 2011-12-04 MED ORDER — METOCLOPRAMIDE HCL 10 MG PO TABS: 5.0000 mg | ORAL_TABLET | Freq: Three times a day (TID) | ORAL | Status: DC | PRN

## 2011-12-04 MED ORDER — WARFARIN - PHARMACIST DOSING INPATIENT: Freq: Every day | Status: DC

## 2011-12-04 MED ORDER — ACETAMINOPHEN 10 MG/ML IV SOLN
INTRAVENOUS | Status: DC | PRN
  Administered 2011-12-04: 1000 mg via INTRAVENOUS

## 2011-12-04 MED ORDER — HYDROMORPHONE HCL PF 1 MG/ML IJ SOLN
INTRAMUSCULAR | Status: DC | PRN
  Administered 2011-12-04: .25 mg via INTRAVENOUS
  Administered 2011-12-04: 1 mg via INTRAVENOUS
  Administered 2011-12-04: .25 mg via INTRAVENOUS
  Administered 2011-12-04 (×2): 0.5 mg via INTRAVENOUS
  Administered 2011-12-04: 1 mg via INTRAVENOUS
  Administered 2011-12-04 (×2): .25 mg via INTRAVENOUS

## 2011-12-04 MED ORDER — METOCLOPRAMIDE HCL 5 MG/ML IJ SOLN
INTRAMUSCULAR | Status: DC | PRN
  Administered 2011-12-04 (×2): 5 mg via INTRAVENOUS

## 2011-12-04 MED ORDER — SODIUM CHLORIDE 0.9 % IJ SOLN: 9.0000 mL | INTRAMUSCULAR | Status: DC | PRN

## 2011-12-04 MED ORDER — MENTHOL 3 MG MT LOZG
1.0000 | LOZENGE | OROMUCOSAL | Status: DC | PRN
  Filled 2011-12-04 (×2): qty 9

## 2011-12-04 MED ORDER — CEFAZOLIN SODIUM-DEXTROSE 2-3 GM-% IV SOLR
INTRAVENOUS | Status: AC
  Filled 2011-12-04: qty 50

## 2011-12-04 MED ORDER — EPHEDRINE SULFATE 50 MG/ML IJ SOLN
INTRAMUSCULAR | Status: DC | PRN
  Administered 2011-12-04: 5 mg via INTRAVENOUS
  Administered 2011-12-04 (×2): 10 mg via INTRAVENOUS
  Administered 2011-12-04: 5 mg via INTRAVENOUS

## 2011-12-04 MED ORDER — METHOCARBAMOL 100 MG/ML IJ SOLN
500.0000 mg | Freq: Four times a day (QID) | INTRAVENOUS | Status: DC | PRN
  Filled 2011-12-04: qty 5

## 2011-12-04 MED ORDER — PROPOFOL 10 MG/ML IV EMUL
INTRAVENOUS | Status: DC | PRN
  Administered 2011-12-04: 160 mg via INTRAVENOUS

## 2011-12-04 MED ORDER — SODIUM CHLORIDE 0.9 % IR SOLN
Status: DC | PRN
  Administered 2011-12-04: 1000 mL

## 2011-12-04 MED ORDER — PHENYLEPHRINE HCL 10 MG/ML IJ SOLN
INTRAMUSCULAR | Status: DC | PRN
  Administered 2011-12-04: 40 ug via INTRAVENOUS
  Administered 2011-12-04 (×2): 80 ug via INTRAVENOUS

## 2011-12-04 MED ORDER — DIPHENHYDRAMINE HCL 12.5 MG/5ML PO ELIX: 12.5000 mg | ORAL_SOLUTION | ORAL | Status: DC | PRN

## 2011-12-04 MED ORDER — METOCLOPRAMIDE HCL 5 MG/ML IJ SOLN: 5.0000 mg | Freq: Three times a day (TID) | INTRAMUSCULAR | Status: DC | PRN

## 2011-12-04 MED ORDER — BISACODYL 10 MG RE SUPP: 10.0000 mg | Freq: Every day | RECTAL | Status: DC | PRN

## 2011-12-04 MED ORDER — DIPHENHYDRAMINE HCL 50 MG/ML IJ SOLN: 12.5000 mg | Freq: Four times a day (QID) | INTRAMUSCULAR | Status: DC | PRN

## 2011-12-04 MED ORDER — LACTATED RINGERS IV SOLN
INTRAVENOUS | Status: DC | PRN
  Administered 2011-12-04 (×3): via INTRAVENOUS

## 2011-12-04 MED ORDER — MORPHINE SULFATE (PF) 1 MG/ML IV SOLN
INTRAVENOUS | Status: AC
  Filled 2011-12-04: qty 25

## 2011-12-04 MED ORDER — SODIUM CHLORIDE 0.9 % IR SOLN
Status: DC | PRN
  Administered 2011-12-04: 3000 mL

## 2011-12-04 MED ORDER — FLEET ENEMA 7-19 GM/118ML RE ENEM: 1.0000 | ENEMA | Freq: Once | RECTAL | Status: AC | PRN

## 2011-12-04 MED ORDER — TRAMADOL HCL 50 MG PO TABS: 50.0000 mg | ORAL_TABLET | Freq: Four times a day (QID) | ORAL | Status: DC | PRN

## 2011-12-04 MED ORDER — DEXAMETHASONE SODIUM PHOSPHATE 10 MG/ML IJ SOLN
10.0000 mg | Freq: Once | INTRAMUSCULAR | Status: DC
  Filled 2011-12-04: qty 1

## 2011-12-04 MED ORDER — POLYETHYLENE GLYCOL 3350 17 G PO PACK: 17.0000 g | PACK | Freq: Every day | ORAL | Status: DC | PRN

## 2011-12-04 MED ORDER — GLYCOPYRROLATE 0.2 MG/ML IJ SOLN
INTRAMUSCULAR | Status: DC | PRN
  Administered 2011-12-04: 0.2 mg via INTRAVENOUS
  Administered 2011-12-04: 0.1 mg via INTRAVENOUS

## 2011-12-04 MED ORDER — NALOXONE HCL 0.4 MG/ML IJ SOLN: 0.4000 mg | INTRAMUSCULAR | Status: DC | PRN

## 2011-12-04 MED ORDER — BUPIVACAINE 0.25 % ON-Q PUMP SINGLE CATH 300ML
300.0000 mL | INJECTION | Status: DC
  Filled 2011-12-04: qty 300

## 2011-12-04 MED ORDER — HYDROMORPHONE HCL PF 1 MG/ML IJ SOLN: 0.2500 mg | INTRAMUSCULAR | Status: DC | PRN

## 2011-12-04 MED ORDER — OXYCODONE HCL 5 MG PO TABS
5.0000 mg | ORAL_TABLET | ORAL | Status: DC | PRN
  Administered 2011-12-05 – 2011-12-06 (×7): 10 mg via ORAL
  Administered 2011-12-06: 5 mg via ORAL
  Administered 2011-12-06 (×2): 10 mg via ORAL
  Administered 2011-12-06: 5 mg via ORAL
  Administered 2011-12-07: 10 mg via ORAL
  Filled 2011-12-04 (×9): qty 2
  Filled 2011-12-04: qty 1
  Filled 2011-12-04: qty 2
  Filled 2011-12-04: qty 1

## 2011-12-04 MED ORDER — CEFAZOLIN SODIUM-DEXTROSE 2-3 GM-% IV SOLR
2.0000 g | INTRAVENOUS | Status: AC
  Administered 2011-12-04: 2 g via INTRAVENOUS

## 2011-12-04 MED ORDER — PHENOL 1.4 % MT LIQD
1.0000 | OROMUCOSAL | Status: DC | PRN
  Filled 2011-12-04: qty 177

## 2011-12-04 MED ORDER — MIDAZOLAM HCL 5 MG/5ML IJ SOLN
INTRAMUSCULAR | Status: DC | PRN
  Administered 2011-12-04: 0.5 mg via INTRAVENOUS
  Administered 2011-12-04: 1 mg via INTRAVENOUS
  Administered 2011-12-04: 0.5 mg via INTRAVENOUS

## 2011-12-04 MED ORDER — ACETAMINOPHEN 10 MG/ML IV SOLN
INTRAVENOUS | Status: AC
  Filled 2011-12-04: qty 100

## 2011-12-04 MED ORDER — LIDOCAINE HCL (CARDIAC) 20 MG/ML IV SOLN
INTRAVENOUS | Status: DC | PRN
  Administered 2011-12-04: 30 mg via INTRAVENOUS

## 2011-12-04 SURGICAL SUPPLY — 80 items
ADAPTER BOLT FEMORAL +2/-2 (Knees) ×2 IMPLANT
BAG ZIPLOCK 12X15 (MISCELLANEOUS) ×2 IMPLANT
BANDAGE ELASTIC 6 VELCRO ST LF (GAUZE/BANDAGES/DRESSINGS) ×2 IMPLANT
BANDAGE ESMARK 6X9 LF (GAUZE/BANDAGES/DRESSINGS) ×1 IMPLANT
BLADE SAG 18X100X1.27 (BLADE) ×2 IMPLANT
BLADE SAW SGTL 11.0X1.19X90.0M (BLADE) ×2 IMPLANT
BNDG ESMARK 6X9 LF (GAUZE/BANDAGES/DRESSINGS) ×2
BONE CEMENT GENTAMICIN (Cement) ×6 IMPLANT
CATH KIT ON-Q SILVERSOAK 5IN (CATHETERS) ×2 IMPLANT
CEMENT RESTRICTOR DEPUY SZ 3 (Cement) ×2 IMPLANT
CLOTH BEACON ORANGE TIMEOUT ST (SAFETY) ×2 IMPLANT
COMP FEM CEM RT SZ3 (Orthopedic Implant) ×2 IMPLANT
COMPONENT FEM CEM RT SZ3 (Orthopedic Implant) ×1 IMPLANT
CONT SPECI 4OZ STER CLIK (MISCELLANEOUS) IMPLANT
CUFF TOURN SGL QUICK 34 (TOURNIQUET CUFF) ×1
CUFF TRNQT CYL 34X4X40X1 (TOURNIQUET CUFF) ×1 IMPLANT
DISTAL WEDGE PFC 4MM RIGHT (Knees) ×2 IMPLANT
DRAPE EXTREMITY T 121X128X90 (DRAPE) ×2 IMPLANT
DRAPE POUCH INSTRU U-SHP 10X18 (DRAPES) ×2 IMPLANT
DRAPE U-SHAPE 47X51 STRL (DRAPES) ×2 IMPLANT
DRSG ADAPTIC 3X8 NADH LF (GAUZE/BANDAGES/DRESSINGS) ×2 IMPLANT
DRSG PAD ABDOMINAL 8X10 ST (GAUZE/BANDAGES/DRESSINGS) ×2 IMPLANT
DURAPREP 26ML APPLICATOR (WOUND CARE) ×2 IMPLANT
ELECT REM PT RETURN 9FT ADLT (ELECTROSURGICAL) ×2
ELECTRODE REM PT RTRN 9FT ADLT (ELECTROSURGICAL) ×1 IMPLANT
EVACUATOR 1/8 PVC DRAIN (DRAIN) ×2 IMPLANT
FACESHIELD LNG OPTICON STERILE (SAFETY) ×12 IMPLANT
FEMORAL ADAPTER (Orthopedic Implant) ×2 IMPLANT
GLOVE BIO SURGEON STRL SZ7.5 (GLOVE) ×2 IMPLANT
GLOVE BIO SURGEON STRL SZ8 (GLOVE) ×2 IMPLANT
GLOVE BIOGEL PI IND STRL 7.0 (GLOVE) ×1 IMPLANT
GLOVE BIOGEL PI IND STRL 8 (GLOVE) ×2 IMPLANT
GLOVE BIOGEL PI IND STRL 8.5 (GLOVE) ×2 IMPLANT
GLOVE BIOGEL PI INDICATOR 7.0 (GLOVE) ×1
GLOVE BIOGEL PI INDICATOR 8 (GLOVE) ×2
GLOVE BIOGEL PI INDICATOR 8.5 (GLOVE) ×2
GLOVE SURG SS PI 7.0 STRL IVOR (GLOVE) ×2 IMPLANT
GLOVE SURG SS PI 7.5 STRL IVOR (GLOVE) ×4 IMPLANT
GOWN SRG XL XLNG 56XLVL 4 (GOWN DISPOSABLE) ×2 IMPLANT
GOWN STRL NON-REIN LRG LVL3 (GOWN DISPOSABLE) ×2 IMPLANT
GOWN STRL NON-REIN XL XLG LVL4 (GOWN DISPOSABLE) ×2
GOWN STRL REIN 2XL XLG LVL4 (GOWN DISPOSABLE) ×4 IMPLANT
GOWN STRL REIN XL XLG (GOWN DISPOSABLE) ×4 IMPLANT
HANDPIECE INTERPULSE COAX TIP (DISPOSABLE) ×1
IMMOBILIZER KNEE 20 (SOFTGOODS) ×2
IMMOBILIZER KNEE 20 THIGH 36 (SOFTGOODS) ×1 IMPLANT
INSERT TC3 RP TIBIAL SZ 3.0 (Knees) ×2 IMPLANT
IV NS IRRIG 3000ML ARTHROMATIC (IV SOLUTION) ×2 IMPLANT
KIT BASIN OR (CUSTOM PROCEDURE TRAY) ×2 IMPLANT
MANIFOLD NEPTUNE II (INSTRUMENTS) ×2 IMPLANT
NS IRRIG 1000ML POUR BTL (IV SOLUTION) ×2 IMPLANT
PACK TOTAL JOINT (CUSTOM PROCEDURE TRAY) ×2 IMPLANT
PAD ABD 7.5X8 STRL (GAUZE/BANDAGES/DRESSINGS) ×2 IMPLANT
PADDING CAST ABS 6INX4YD NS (CAST SUPPLIES) ×1
PADDING CAST ABS COTTON 6X4 NS (CAST SUPPLIES) ×1 IMPLANT
PADDING CAST COTTON 6X4 STRL (CAST SUPPLIES) ×6 IMPLANT
PATELLA DOME PFC 35MM (Knees) ×2 IMPLANT
POSITIONER SURGICAL ARM (MISCELLANEOUS) ×2 IMPLANT
POST AVE PFC 4MM (Knees) ×4 IMPLANT
SET HNDPC FAN SPRY TIP SCT (DISPOSABLE) ×1 IMPLANT
SLEEVE FEM UNIV DIST PRO SZ 20 (Sleeve) ×2 IMPLANT
SPONGE GAUZE 4X4 12PLY (GAUZE/BANDAGES/DRESSINGS) ×2 IMPLANT
STAPLER VISISTAT 35W (STAPLE) ×2 IMPLANT
STEM TIBIA PFC 13X30MM (Stem) ×2 IMPLANT
STEM UNIVERSAL REVISION 75X14 (Stem) ×2 IMPLANT
SUCTION FRAZIER 12FR DISP (SUCTIONS) ×2 IMPLANT
SUT PDS AB 1 CT1 27 (SUTURE) ×4 IMPLANT
SUT VIC AB 2-0 CT1 27 (SUTURE) ×1
SUT VIC AB 2-0 CT1 TAPERPNT 27 (SUTURE) ×1 IMPLANT
SUT VLOC 180 0 24IN GS25 (SUTURE) ×2 IMPLANT
SWAB COLLECTION DEVICE MRSA (MISCELLANEOUS) ×2 IMPLANT
TOWEL OR 17X26 10 PK STRL BLUE (TOWEL DISPOSABLE) ×4 IMPLANT
TOWER CARTRIDGE SMART MIX (DISPOSABLE) ×2 IMPLANT
TRAY FOLEY CATH 14FRSI W/METER (CATHETERS) ×2 IMPLANT
TRAY REVISION SZ3 25MM (Knees) ×2 IMPLANT
TRAY SLEEVE CEM ML (Knees) ×2 IMPLANT
TUBE ANAEROBIC SPECIMEN COL (MISCELLANEOUS) ×4 IMPLANT
WATER STERILE IRR 1500ML POUR (IV SOLUTION) ×4 IMPLANT
WEDGE DISTAL PFC RIGHT 4MM (Knees) ×1 IMPLANT
WRAP KNEE MAXI GEL POST OP (GAUZE/BANDAGES/DRESSINGS) ×4 IMPLANT

## 2011-12-04 NOTE — Interval H&P Note (Signed)
History and Physical Interval Note:  12/04/2011 4:18 PM  [name]  has presented today for surgery, with the diagnosis of resection arthroplasty of the right knee  The various methods of treatment have been discussed with the patient and family. After consideration of risks, benefits and other options for treatment, the patient has consented to  Procedure(s) (LRB): TOTAL KNEE REVISION (Right) as a surgical intervention .  The patient's history has been reviewed, patient examined, no change in status, stable for surgery.  I have reviewed the patients' chart and labs.  Questions were answered to the patient's satisfaction.     Loanne Drilling

## 2011-12-04 NOTE — Anesthesia Procedure Notes (Signed)
Procedure Name: Intubation Date/Time: 12/04/2011 4:37 PM Performed by: Randon Goldsmith CATHERINE PAYNE Pre-anesthesia Checklist: Patient identified, Emergency Drugs available, Suction available and Patient being monitored Patient Re-evaluated:Patient Re-evaluated prior to inductionOxygen Delivery Method: Circle system utilized Preoxygenation: Pre-oxygenation with 100% oxygen Intubation Type: IV induction Ventilation: Mask ventilation without difficulty and Oral airway inserted - appropriate to patient size Laryngoscope Size: Mac and 4 Grade View: Grade I Tube type: Oral Tube size: 7.5 mm Number of attempts: 1 Airway Equipment and Method: Stylet Placement Confirmation: ETT inserted through vocal cords under direct vision,  positive ETCO2 and CO2 detector Secured at: 21 cm Tube secured with: Tape Dental Injury: Teeth and Oropharynx as per pre-operative assessment

## 2011-12-04 NOTE — Transfer of Care (Signed)
Immediate Anesthesia Transfer of Care Note  Patient: [name]  Procedure(s) Performed: Procedure(s) (LRB): TOTAL KNEE REVISION (Right)  Patient Location: PACU  Anesthesia Type: General  Level of Consciousness: sedated, patient cooperative and responds to stimulation  Airway & Oxygen Therapy: Patient Spontanous Breathing  Post-op Assessment: Report given to PACU RN, Post -op Vital signs reviewed and stable and Patient moving all extremities  Post vital signs: Reviewed and stable  Complications: No apparent anesthesia complications

## 2011-12-04 NOTE — Preoperative (Signed)
Beta Blockers   Reason not to administer Beta Blockers:Not Applicable, pt took BB this am 

## 2011-12-04 NOTE — Anesthesia Postprocedure Evaluation (Signed)
  Anesthesia Post-op Note  Patient: [name]  Procedure(s) Performed: Procedure(s) (LRB): TOTAL KNEE REVISION (Right)  Patient Location: PACU  Anesthesia Type: General  Level of Consciousness: awake and alert   Airway and Oxygen Therapy: Patient Spontanous Breathing  Post-op Pain: mild  Post-op Assessment: Post-op Vital signs reviewed, Patient's Cardiovascular Status Stable, Respiratory Function Stable, Patent Airway and No signs of Nausea or vomiting  Post-op Vital Signs: stable  Complications: No apparent anesthesia complications

## 2011-12-04 NOTE — Brief Op Note (Signed)
12/04/2011  6:53 PM  PATIENT:  [name]  71 y.o. female  PRE-OPERATIVE DIAGNOSIS:  resection arthroplasty of the right knee  POST-OPERATIVE DIAGNOSIS:   resection arthroplasty of the right knee   PROCEDURE:  Procedure(s) (LRB): TOTAL KNEE REIMPLANTATION RIGHT  SURGEON:  Surgeon(s) and Role:    * Loanne Drilling, MD - Primary  PHYSICIAN ASSISTANT:   ASSISTANTS: Avel Peace, PA-C   ANESTHESIA:   general  EBL:  Total I/O In: 2000 [I.V.:2000] Out: 500 [Urine:400; Blood:100]  DRAINS: (Medial) Hemovact drain(s) in the right knee with  Suction Open   LOCAL MEDICATIONS USED:  MARCAINE     COUNTS:  YES  TOURNIQUET:   Total Tourniquet Time Documented: Thigh (Right) - 85 minutes  DICTATION: .Other Dictation: Dictation Number [id number]  PLAN OF CARE: Admit to inpatient   PATIENT DISPOSITION:  PACU - hemodynamically stable.   Vanessa Rankin Hend Mccarrell, MD    12/04/2011, 7:03 PM

## 2011-12-04 NOTE — Anesthesia Preprocedure Evaluation (Signed)
Anesthesia Evaluation  Patient identified by MRN, date of birth, ID band Patient awake    Reviewed: Allergy & Precautions, H&P , NPO status , Patient's Chart, lab work & pertinent test results, reviewed documented beta blocker date and time   Airway Mallampati: II TM Distance: >3 FB Neck ROM: Full    Dental  (+) Teeth Intact and Dental Advisory Given   Pulmonary COPD         Cardiovascular hypertension, Pt. on medications Rhythm:Regular Rate:Normal  Denies cardiac symptoms   Neuro/Psych negative neurological ROS  negative psych ROS   GI/Hepatic negative GI ROS, Neg liver ROS,   Endo/Other  Diabetes mellitus-Diet controlled  Renal/GU negative Renal ROS  negative genitourinary   Musculoskeletal   Abdominal   Peds negative pediatric ROS (+)  Hematology negative hematology ROS (+)   Anesthesia Other Findings Upper front crowns  Reproductive/Obstetrics negative OB ROS                           Anesthesia Physical Anesthesia Plan  ASA: III  Anesthesia Plan: General   Post-op Pain Management:    Induction: Intravenous  Airway Management Planned: Oral ETT  Additional Equipment:   Intra-op Plan:   Post-operative Plan: Extubation in OR  Informed Consent: I have reviewed the patients History and Physical, chart, labs and discussed the procedure including the risks, benefits and alternatives for the proposed anesthesia with the patient or authorized representative who has indicated his/her understanding and acceptance.   Dental advisory given  Plan Discussed with: CRNA and Surgeon  Anesthesia Plan Comments:         Anesthesia Quick Evaluation

## 2011-12-05 LAB — CBC
HCT: 29.5 % — ABNORMAL LOW (ref 36.0–46.0)
MCH: 27.9 pg (ref 26.0–34.0)
MCV: 86.5 fL (ref 78.0–100.0)
Platelets: 153 10*3/uL (ref 150–400)
RBC: 3.41 MIL/uL — ABNORMAL LOW (ref 3.87–5.11)
RDW: 17.7 % — ABNORMAL HIGH (ref 11.5–15.5)
WBC: 5.3 10*3/uL (ref 4.0–10.5)

## 2011-12-05 LAB — BASIC METABOLIC PANEL
BUN: 12 mg/dL (ref 6–23)
CO2: 27 mEq/L (ref 19–32)
Calcium: 8.5 mg/dL (ref 8.4–10.5)
Chloride: 101 mEq/L (ref 96–112)
Creatinine, Ser: 0.64 mg/dL (ref 0.50–1.10)

## 2011-12-05 LAB — GLUCOSE, CAPILLARY: Glucose-Capillary: 136 mg/dL — ABNORMAL HIGH (ref 70–99)

## 2011-12-05 LAB — PROTIME-INR: Prothrombin Time: 14.9 seconds (ref 11.6–15.2)

## 2011-12-05 MED ORDER — MORPHINE SULFATE 2 MG/ML IJ SOLN: 1.0000 mg | INTRAMUSCULAR | Status: DC | PRN

## 2011-12-05 MED ORDER — SODIUM CHLORIDE 0.9 % IJ SOLN
10.0000 mL | INTRAMUSCULAR | Status: DC | PRN
  Administered 2011-12-05: 10 mL

## 2011-12-05 MED ORDER — WARFARIN SODIUM 7.5 MG PO TABS
7.5000 mg | ORAL_TABLET | Freq: Once | ORAL | Status: AC
  Administered 2011-12-05: 7.5 mg via ORAL
  Filled 2011-12-05: qty 1

## 2011-12-05 MED ORDER — POLYSACCHARIDE IRON COMPLEX 150 MG PO CAPS
150.0000 mg | ORAL_CAPSULE | Freq: Every day | ORAL | Status: DC
  Administered 2011-12-05 – 2011-12-07 (×3): 150 mg via ORAL
  Filled 2011-12-05 (×3): qty 1

## 2011-12-05 NOTE — Clinical Documentation Improvement (Signed)
Anemia Blood Loss Clarification  THIS DOCUMENT IS NOT A PERMANENT PART OF THE MEDICAL RECORD  RESPOND TO THE THIS QUERY, FOLLOW THE INSTRUCTIONS BELOW:  1. If needed, update documentation for the patient's encounter via the notes activity.  2. Access this query again and click edit on the In [employer].  3. After updating, or not, click F2 to complete all highlighted (required) fields concerning your review. Select "additional documentation in the medical record" OR "no additional documentation provided".  4. Click Sign note button.  5. The deficiency will fall out of your In Basket *Please let us know if you are not able to complete this workflow by phone or e-mail (listed below).        12/05/11  Dear Stormy Fabian, F/Associates  In an effort to better capture your patient's severity of illness, reflect appropriate length of stay and utilization of resources, a review of the patient medical record has revealed the following indicators.    Based on your clinical judgment, please clarify and document in a progress note and/or discharge summary the clinical condition associated with the following supporting information:  In responding to this query please exercise your independent judgment.  The fact that a query is asked, does not imply that any particular answer is desired or expected.  Pt admitted for resection arthroplasty of the right knee   According to lab pt's H/H=9.5/29.5 s/p post op EBL=100cc   Please clarify based on abnormal H/H whether or not abnormal H/H can be further specified as one of the diagnoses listed below and document in pn or d/c summary.   Possible Clinical Conditions?   " Expected Acute Blood Loss Anemia  " Acute Blood Loss Anemia " Acute on chronic blood loss anemia  " Chronic blood loss anemia  " Precipitous drop in Hematocrit  " Other Condition________________  " Cannot Clinically Determine  Risk Factors: (recent surgery, pre op anemia, EBL  in OR)  Supporting Information:  Signs and Symptoms (unable to ambulate, weakness, dizziness, unable to participate in care)  Diagnostics: EBL: Blood:100  Component     Latest Ref Rng 12/05/2011          Hemoglobin     12.0 - 15.0 g/dL 9.5 (L)  HCT     82.9 - 46.0 % 29.5 (L)     Treatments: Transfusion: IV fluids / 0.9 % sodium chloride infusion   Serial H&H monitoring   Reviewed: additional documentation in the medical record--added to progress note of 6/29  Thank You,  Philbert Riser J Henley  [employer] Ambrose Mantle RN, BSN, CCDS Clinical Documentation Specialist [facility] Dept Pager: [phone]  Health Information Management [place]

## 2011-12-05 NOTE — Progress Notes (Signed)
ANTICOAGULATION CONSULT NOTE - Follow Up Consult  Pharmacy Consult for Warfarin Indication: VTE Prophylaxis; s/p reimplantation R TKA 12/04/11  No Known Allergies  Patient Measurements: Height: 5\' 5"  (165.1 cm) Weight: 185 lb (83.915 kg) IBW/kg (Calculated) : 57    Vital Signs: Temp: 97.9 F (36.6 C) (06/27 1610) Temp src: Oral (06/27 0633) BP: 98/60 mmHg (06/27 0633) Pulse Rate: 90  (06/27 0633)  Labs:  Basename 12/05/11 0500  HGB 9.5*  HCT 29.5*  PLT 153  APTT --  LABPROT 14.9  INR 1.15  HEPARINUNFRC --  CREATININE 0.64  CKTOTAL --  CKMB --  TROPONINI --    Estimated Creatinine Clearance: 71 ml/min (by C-G formula based on Cr of 0.64).   Medications:  Scheduled:    . acetaminophen  1,000 mg Intravenous Q6H  .  ceFAZolin (ANCEF) IV  1 g Intravenous Q6H  .  ceFAZolin (ANCEF) IV  2 g Intravenous 60 min Pre-Op  . coumadin book  1 each Does not apply Once  . docusate sodium  100 mg Oral BID  . enoxaparin  30 mg Subcutaneous Q12H  . hydrALAZINE      . labetalol  300 mg Oral Once  . labetalol  300 mg Oral BID  . morphine      . potassium chloride SA  20 mEq Oral Daily  . rifampin  300 mg Oral Q supper  . warfarin  5 mg Oral NOW  . warfarin   Does not apply Once  . Warfarin - Pharmacist Dosing Inpatient   Does not apply q1800  . DISCONTD: acetaminophen  1,000 mg Intravenous Once  . DISCONTD: dexamethasone  10 mg Intravenous Once  . DISCONTD: morphine   Intravenous Q4H    Assessment:  71 y/o F s/p reimplantation of R TKA 6/26, placed on warfarin / enoxaparin for DVT prophylaxis  Concomitant rifampin noted.  Anticipate larger than average warfarin dosage requirement while on this antibiotic.  During recent admission (April 2013) while patient was on rifampin, INR responded slowly to warfarin 7.5mg  doses.  Has already received warfarin 5mg  x1 at 0124   Goal of Therapy:  INR 2-3 Monitor platelets by anticoagulation protocol: Yes   Plan:  1. Warfarin  7.5mg  PO x 1 tonight (in addition to 5mg  already given this morning). 2. Follow PT/INR daily 3. Lovenox 30mg  SQ q12h.  Per ortho, continue until INR>=1.8.  Hope Budds, PharmD, BCPS Pager: [phone] 12/05/2011,9:28 AM

## 2011-12-05 NOTE — Op Note (Signed)
NAMEJERNI, SELMER NO.:  [id number]  MEDICAL RECORD NO.:  [id number]  LOCATION:  1609                         FACILITY:  [facility]  PHYSICIAN:  Ollen Gross, M.D.    DATE OF BIRTH:  09/27/1940  DATE OF PROCEDURE:  12/04/2011 DATE OF DISCHARGE:                              OPERATIVE REPORT   PREOPERATIVE DIAGNOSIS:  Infected right knee, now status post resection arthroplasty.  POSTOPERATIVE DIAGNOSIS:  Infected right knee, now status post resection arthroplasty.  PROCEDURE:  Right total knee arthroplasty, re-implantation.  SURGEON:  Ollen Gross, M.D.  ASSISTANT:  [facility].  ANESTHESIA:  General.  ESTIMATED BLOOD LOSS:  Minimal.  DRAINS:  Hemovac x1.  Two limbs utilized; 1 in the joint, 1 subcutaneous.  TOURNIQUET TIME:  Up 64 minutes at 300 mmHg, down 8 minutes, set up an additional 24 minutes at 300 mmHg.  COMPLICATIONS:  None.  CONDITION:  Stable to recovery.  BRIEF CLINICAL NOTE:  Ms. Vizcarrondo is a 71 year old female, complex history in regard to her right knee.  She has had a recent infection and had a resection arthroplasty with placement of an antibiotic spacer.  She has had normalization of her labs, and the infection is cleared.  She presents now for total knee arthroplasty reimplantation.  PROCEDURE IN DETAIL:  After successful administration of general anesthetic, a tourniquet was placed high on her right thigh and her right lower extremity was prepped and draped in the usual sterile fashion.  Extremities wrapped in Esmarch, and tourniquet inflated to 300 mmHg.  Medial parapatellar incision which was our old incision was reutilized, skin cut with a 10 blade through the subcutaneous tissue to the level of the extensor mechanism.  We  encountered minimal fluid in the joint.  This was sent for stat Gram stain, which showed no organisms, no white cells.  The soft tissue on the proximal medial tibia subperiosteally elevated to the  joint line with a knife and into the semimembranosus bursa with a Cobb elevator.  Soft tissue laterally was elevated with attention being paid avoiding the patellar tendon on the tibial tubercle.  Patella was everted and knee was flexed and antibiotic spacer was removed.  It was an articulating spacer.  I removed the polyethylene first and then removed the cement.  We did not have any additional bone loss while removing this.  We then drilled into the femoral and tibial canals.  The canals were thoroughly irrigated with saline.  Soft tissue and small amount of cement were removed from around the tibial side and just soft tissue from the femoral side.  I then reamed on the femoral side to the 14 mm, had an excellent press-fit.  On tibial side, we reamed to 13 mm for 13 mm cemented stem.  We then began preparing the tibia.  Tibial retractors were placed, and the tibia subluxed forward. Extramedullary tibial alignment guide was placed referencing proximally at the medial aspect of tibial tubercle and distally along the second metatarsal axis tibial crest.  A resection level was marked and resection made with an oscillating saw.  Her previous size was a size 3 which was the most appropriate size for  this.  Given the large amount of bony defect, I felt that I had to use a built-up platform tibia.  Thus we used the size 3 with the 25 mm build-up.  The size 1 down at the base of the bone and tapers up to a size 3.  We also prepared for the tibial sleeve, 29 mm sleeve, had excellent rotational purchase.  A trial tibia was then set up which was a size 1 MBT revision tray with a 29 mm sleeve and a 13 x 30 stem extension.  Had excellent purchase on the tibial cut bone surface.  We then addressed the femur.  The femoral intramedullary reamer size 14 mm was placed.  The distal femoral cutting block which was 5 degree right valgus was placed on that.  I was able to resect that laterally but medially  I had to go up 4 mm to get any bone, thus a 4 mm distal medial augment was placed.  The sizer was placed and 3 as most appropriate.  Rotations marked at the epicondylar axis and confirmed by creating a rectangular flexion gap at 90 degrees of flexion.  The block was placed in a +2 position effectively raising the stem and lowering the femoral component to the anterior cortex of the femur.  With the anterior and posterior, chamfer cuts were made.  Intercondylar blocks placed and that cut was made for TC3.  The trials were then placed, which on the tibial side was the 13 x 30 stem extension, 29 sleeve and the built-up tibial tray which was size 3 proximal, 1 distal.  On the femoral side, we had the TC3 size 3 femur with a 4 mm distal medial augment and 4 mm posterior augments medial and lateral.  I also prepared for a sleeve on the femoral side now with the 20 mm conical sleeve.  A trial sleeve was placed on the stem with which it was a 14 x 75 stem extension on the femur which is in the +2 position 5 degree right valgus.  We had excellent bony contact with both trial implants.  I placed the 15 mm trial insert, full extension achieved with excellent varus-valgus, anterior-posterior balance through the range of motion.  The patella was sitting at the appropriate level in regard to the joint line in regard to the femoral component.  I everted the patella and then resected down to a thickness of 12 mm.  A 35 trial was placed after the lug holes were drilled.  This tracked normally.  We then released the tourniquet, her initial tourniquet time was 64 minutes.  Minimal bleeding was encountered.  While the tourniquet was down for a total of 8 minutes, the components were assembled on the back table.  Once the components were fully assembled and 8 minutes had elapsed, then leg was rewrapped in Esmarch and tourniquet reinflated to 300 mmHg.  All trials were removed and the cut bone surfaces  thoroughly irrigated with pulsatile lavage with saline.  Cement restrictor was placed at appropriate depth in the tibial canal.  This was a size 3 cement restrictor.  Three batches of gentamicin impregnated cement was then mixed, and once ready for implantation, I injected into the tibial canal and on the cut bone surface.  Tibial components impacted, all extruded cement removed.  Femoral component was cemented distally and press-fit on the stem.  The sleeve was also cemented in.  The extruded cement on the femoral side was removed.  The patella  was cemented and held with a clamp.  This was a 35 patella.  The trial 15 mm inserts placed, knee held in full extension and again all extruded cement removed.  I trialed again, we were going with 17.5 as it was a little play with the 15, and with the 17.5 full extension achieved with excellent varus-valgus anterior-posterior balance throughout full range of motion.  The 17.5 mm TC3 rotating platform insert was placed in the tibial tray.  Wounds copiously irrigated with saline solution, and the arthrotomy closed over Hemovac drain with running #1 V-Loc suture. Flexion against gravity was about 90 degrees.  Tourniquet for second limb was 24 minutes.  The second limb of the Hemovac drain was placed in the subcu tissues and the area was closed with interrupted 2-0 Vicryl. Catheter for Marcaine pain pump was placed and pumps initiated.  Skin was stapled and incision was cleaned and dried and a bulky sterile dressing applied.  Drains hooked to suction.  She was awakened and transported to recovery in stable condition.     Ollen Gross, M.D.     FA/MEDQ  D:  12/04/2011  T:  12/05/2011  Job:  [id number]

## 2011-12-05 NOTE — Progress Notes (Signed)
Subjective: 1 Day Post-Op Procedure(s) (LRB): TOTAL KNEE REVISION (Right) Patient reports pain as mild.   Patient seen in rounds by Dr. Lequita Halt. Patient is well, and has had no acute complaints or problems We will start therapy today.  Plan is to go Home after hospital stay.  Objective: Vital signs in last 24 hours: Temp:  [97 F (36.1 C)-98.3 F (36.8 C)] 97.9 F (36.6 C) (06/27 4098) Pulse Rate:  [76-90] 90  (06/27 0633) Resp:  [6-18] 18  (06/27 0717) BP: (98-175)/(60-104) 98/60 mmHg (06/27 0633) SpO2:  [94 %-100 %] 98 % (06/27 0717) Weight:  [83.915 kg (185 lb)] 83.915 kg (185 lb) (06/27 0909)  Intake/Output from previous day:  Intake/Output Summary (Last 24 hours) at 12/05/11 0932 Last data filed at 12/05/11 1191  Gross per 24 hour  Intake   3300 ml  Output   1410 ml  Net   1890 ml    Intake/Output this shift: UOP 475  Labs:  Basename 12/05/11 0500  HGB 9.5*    Basename 12/05/11 0500  WBC 5.3  RBC 3.41*  HCT 29.5*  PLT 153    Basename 12/05/11 0500  NA 136  K 4.3  CL 101  CO2 27  BUN 12  CREATININE 0.64  GLUCOSE 196*  CALCIUM 8.5    Basename 12/05/11 0500  LABPT --  INR 1.15    EXAM General - Patient is Alert, Appropriate and Oriented Extremity - Neurovascular intact Sensation intact distally Dorsiflexion/Plantar flexion intact Dressing - dressing C/D/I Motor Function - intact, moving foot and toes well on exam.  Hemovac pulled without difficulty.  Past Medical History  Diagnosis Date  . DJD (degenerative joint disease)     generalized  . Dyslipidemia   . Diabetes mellitus     borderline  . Squamous cell carcinoma   . Osteoarthritis   . HTN (hypertension)   . Spinal stenosis of lumbar region 04/17/2011  . Dyspnea 09-27-11    with extreme exertion.  Marland Kitchen COPD (chronic obstructive pulmonary disease) 09-27-11    mild  . Rotator cuff syndrome 09-27-11    rotator cuff pain sometimes  . Carpal tunnel syndrome, right 09-27-11    right hand   . 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 Gallstones 09-27-11    hx. not bothered    Assessment/Plan: 1 Day Post-Op Procedure(s) (LRB): TOTAL KNEE REVISION (Right) Active Problems:  * No active hospital problems. *    Advance diet Up with therapy Continue foley due to strict I&O and urinary output monitoring Discharge home with home health  DVT Prophylaxis - Lovenox and Coumadin Weight-Bearing as tolerated to right leg Keep foley until tomorrow. No vaccines. D/C PCA Morhpine, Change to IV push D/C O2 and Pulse OX and try on [address]  Patrica Duel 12/05/2011, 9:32 AM

## 2011-12-05 NOTE — Evaluation (Signed)
Occupational Therapy Evaluation Patient Details Name: [name] MRN: [id number] DOB: 11/13/1940 Today's Date: 12/05/2011 Time: 4098-1191 OT Time Calculation (min): 17 min  OT Assessment / Plan / Recommendation Clinical Impression  Pt doing well POD 1 LTKR. All education completed. Pt will have necessary level of A from family upon d/c.    OT Assessment  Patient does not need any further OT services    Follow Up Recommendations  No OT follow up    Barriers to Discharge      Equipment Recommendations  None recommended by OT    Recommendations for Other Services    Frequency       Precautions / Restrictions Precautions Precautions: Knee Required Braces or Orthoses: Knee Immobilizer - Right Knee Immobilizer - Right: Discontinue once straight leg raise with < 10 degree lag Restrictions Weight Bearing Restrictions: No   Pertinent Vitals/Pain     ADL  Grooming: Simulated;Wash/dry hands;Min guard Where Assessed - Grooming: Supported standing Upper Body Bathing: Simulated;Set up Where Assessed - Upper Body Bathing: Unsupported sitting Lower Body Bathing: Simulated;Minimal assistance Where Assessed - Lower Body Bathing: Supported sit to stand Upper Body Dressing: Simulated;Set up Where Assessed - Upper Body Dressing: Unsupported sitting Lower Body Dressing: Simulated;Moderate assistance Where Assessed - Lower Body Dressing: Supported sit to Pharmacist, hospital: Performed;Minimal Dentist Method: Sit to Barista: Raised toilet seat with arms (or 3-in-1 over toilet) Toileting - Clothing Manipulation and Hygiene: Performed;Min guard Where Assessed - Engineer, mining and Hygiene: Sit to stand from 3-in-1 or toilet Tub/Shower Transfer: Other (comment) (pt stated she would be spongebathing.) Equipment Used: Rolling walker Transfers/Ambulation Related to ADLs: Pt ambulated to the bathroom with minguard A and VCs for step  sequence and safe technique manipulating RW.    OT Diagnosis:    OT Problem List:   OT Treatment Interventions:     OT Goals    Visit Information  Last OT Received On: 12/05/11 Assistance Needed: +1    Subjective Data  Subjective: This is not my 1st go around with this Patient Stated Goal: Walk without a cane or walker.   Prior Functioning  Home Living Lives With: Spouse Available Help at Discharge: Family Type of Home: House Home Access: Stairs to enter Secretary/administrator of Steps: 3 Entrance Stairs-Rails: Right Home Layout: Able to live on main level with bedroom/bathroom Bathroom Shower/Tub: Engineer, manufacturing systems: Handicapped height Bathroom Accessibility: Yes Home Adaptive Equipment: Grab bars around toilet;Walker - rolling;Bedside commode/3-in-1    Cognition  Overall Cognitive Status: Appears within functional limits for tasks assessed/performed Arousal/Alertness: Awake/alert Orientation Level: Appears intact for tasks assessed Behavior During Session: [facility] for tasks performed    Extremity/Trunk Assessment Right Upper Extremity Assessment RUE ROM/Strength/Tone: [facility] for tasks assessed Left Upper Extremity Assessment LUE ROM/Strength/Tone: WFL for tasks assessed   Mobility Transfers Sit to Stand: 4: Min assist;With upper extremity assist;With armrests;From chair/3-in-1 Stand to Sit: 4: Min assist;With upper extremity assist;With armrests;To chair/3-in-1 Details for Transfer Assistance: min VCs for hand placement and LE management.   Exercise    Balance    End of Session OT - End of Session Activity Tolerance: Patient tolerated treatment well Patient left: in chair;with call bell/phone within reach;with family/visitor present  GO     Giovanny Dugal A OTR/L [phone] 12/05/2011, 12:57 PM

## 2011-12-05 NOTE — Progress Notes (Signed)
ANTICOAGULATION CONSULT NOTE - Initial Consult  Pharmacy Consult for warfarin Indication: VTE prophylaxis  No Known Allergies  Patient Measurements:   Heparin Dosing Weight:   Vital Signs: Temp: 98.2 F (36.8 C) (06/27 0206) Temp src: Oral (06/27 0206) BP: 117/73 mmHg (06/27 0206) Pulse Rate: 81  (06/27 0206)  Labs: No results found for this basename: HGB:2,HCT:3,PLT:3,APTT:3,LABPROT:3,INR:3,HEPARINUNFRC:3,CREATININE:3,CKTOTAL:3,CKMB:3,TROPONINI:3 in the last 72 hours  The CrCl is unknown because both a height and weight (above a minimum accepted value) are required for this calculation.   Medical History: Past Medical History  Diagnosis Date  . DJD (degenerative joint disease)     generalized  . Dyslipidemia   . Diabetes mellitus     borderline  . Squamous cell carcinoma   . Osteoarthritis   . HTN (hypertension)   . Spinal stenosis of lumbar region 04/17/2011  . Dyspnea 09-27-11    with extreme exertion.  Marland Kitchen COPD (chronic obstructive pulmonary disease) 09-27-11    mild  . Rotator cuff syndrome 09-27-11    rotator cuff pain sometimes  . Carpal tunnel syndrome, right 09-27-11    right hand  . 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 Gallstones 09-27-11    hx. not bothered    Medications:  Prescriptions prior to admission  Medication Sig Dispense Refill  . aspirin 81 MG tablet Take 81 mg by mouth daily.      Marland Kitchen ceFAZolin (ANCEF) 1-5 GM-% Inject 50 mLs (1 g total) into the vein every 8 (eight) hours. IV CEFAZOLIN treatment for a total of six weeks via PICC line.  120 mL  0  . Ferrous Sulfate (IRON) 325 (65 FE) MG TABS Take 65 mg by mouth 2 (two) times a week.      . labetalol (NORMODYNE) 300 MG tablet Take 300 mg by mouth 2 (two) times daily.       . Multiple Vitamins-Minerals (ONE-A-DAY MENOPAUSE FORMULA) TABS Take 1 tablet by mouth daily.      Marland Kitchen omega-3 acid ethyl esters (LOVAZA) 1 G capsule Take 1 g by mouth 2 (two) times daily.      Marland Kitchen oxycodone (OXY-IR) 5 MG  capsule Take 5-10 mg by mouth every 4 (four) hours as needed. Pain      . potassium chloride SA (K-DUR,KLOR-CON) 20 MEQ tablet Take 20 mEq by mouth daily.      . quinapril-hydrochlorothiazide (ACCURETIC) 20-12.5 MG per tablet Take 1 tablet by mouth every morning.       . rifampin (RIFADIN) 300 MG capsule Take 300 mg by mouth daily with supper.      . triamcinolone cream (KENALOG) 0.1 % Apply 1 application topically 2 (two) times daily as needed. Rash      . fluconazole (DIFLUCAN) 100 MG tablet Take 100 mg by mouth daily.       Scheduled:    . acetaminophen  1,000 mg Intravenous Q6H  .  ceFAZolin (ANCEF) IV  1 g Intravenous Q6H  .  ceFAZolin (ANCEF) IV  2 g Intravenous 60 min Pre-Op  . coumadin book  1 each Does not apply Once  . docusate sodium  100 mg Oral BID  . enoxaparin  30 mg Subcutaneous Q12H  . hydrALAZINE      . labetalol  300 mg Oral Once  . labetalol  300 mg Oral BID  . morphine   Intravenous Q4H  . morphine      . potassium chloride SA  20 mEq Oral Daily  . rifampin  300 mg Oral Q supper  . warfarin  5 mg Oral NOW  . warfarin   Does not apply Once  . Warfarin - Pharmacist Dosing Inpatient   Does not apply q1800  . DISCONTD: acetaminophen  1,000 mg Intravenous Once  . DISCONTD: dexamethasone  10 mg Intravenous Once    Assessment: Patient s/p ortho surgery.  MD wishes pharmacy to dose warfarin for VTE prophylaxis.  Goal of Therapy:  INR 2-3    Plan:  Daily INR Warfarin 5mg  po x1 tonight Coumadin book/video  Aleene Davidson Crowford 12/05/2011,2:34 AM

## 2011-12-05 NOTE — Progress Notes (Signed)
Utilization review completed.  

## 2011-12-05 NOTE — Care Management Note (Unsigned)
    Page 1 of 2   12/05/2011     5:37:09 PM   CARE MANAGEMENT NOTE 12/05/2011  Patient:  [name], [name]   Account Number:  [id number]  Date Initiated:  12/05/2011  Documentation initiated by:  Colleen Can  Subjective/Objective Assessment:   dx resection arthroplasty knee-right     Action/Plan:   CM spoke with patient. Plans are for patient to return to her home in [place] where spouse will be caregiver. Already has DME. Has used [place] for [facility] services and wishes to use again   Anticipated DC Date:  12/07/2011   Anticipated DC Plan:  HOME W HOME HEALTH SERVICES  In-house referral  Clinical Social Worker      DC Associate Professor  CM consult      [facility] Choice  HOME HEALTH   Choice offered to / List presented to:  [name]   DME arranged  NA      DME agency  NA     HH arranged  HH-1 RN  HH-2 PT      [facility] agency  [facility]   Status of service:  In process, will continue to follow Medicare Important Message given?   (If response is "NO", the following Medicare IM given date fields will be blank) Date Medicare IM given:   Date Additional Medicare IM given:    Discharge Disposition:    Per UR Regulation:    If discussed at Long Length of Stay Meetings, dates discussed:    Comments:

## 2011-12-05 NOTE — Evaluation (Signed)
Physical Therapy Evaluation Patient Details Name: [name] MRN: [id number] DOB: [birth year] Today's Date: 12/05/2011 Time: 4098-1191 PT Time Calculation (min): 31 min  PT Assessment / Plan / Recommendation Clinical Impression  Pt with R TKR reimplantation presents with decreased R LE strength/ROM and limitations in functional mobility    PT Assessment  Patient needs continued PT services    Follow Up Recommendations  Home health PT    Barriers to Discharge        Equipment Recommendations  None recommended by PT    Recommendations for Other Services     Frequency 7X/week    Precautions / Restrictions Precautions Precautions: Knee Required Braces or Orthoses: Knee Immobilizer - Right Knee Immobilizer - Right: Discontinue once straight leg raise with < 10 degree lag Restrictions Weight Bearing Restrictions: No Other Position/Activity Restrictions: WBAT   Pertinent Vitals/Pain 8/10 with ambulation; pt premedicated, cold packs provided, RN aware      Mobility  Bed Mobility Bed Mobility: Sit to Supine Sit to Supine: 4: Min assist Details for Bed Mobility Assistance: min assist for R LE with cues for sequence Transfers Transfers: Sit to Stand;Stand to Sit Sit to Stand: 4: Min assist;3: Mod assist Stand to Sit: 4: Min assist Details for Transfer Assistance: cues and assist for LE management Ambulation/Gait Ambulation/Gait Assistance: 4: Min assist;3: Mod assist Ambulation Distance (Feet): 28 Feet Assistive device: Rolling walker Ambulation/Gait Assistance Details: min cues for posture and sequence Gait Pattern: Step-to pattern    Exercises Total Joint Exercises Ankle Circles/Pumps: AROM;10 reps;Both;Supine Quad Sets: AROM;10 reps;Both;Supine Heel Slides: 10 reps;AAROM;Supine;Right Straight Leg Raises: AAROM;10 reps;Right;Supine   PT Diagnosis: Difficulty walking  PT Problem List: Decreased strength;Decreased range of motion;Decreased activity  tolerance;Decreased mobility;Decreased knowledge of use of DME;Pain PT Treatment Interventions: DME instruction;Gait training;Stair training;Functional mobility training;Therapeutic activities;Therapeutic exercise;Patient/family education   PT Goals Acute Rehab PT Goals PT Goal Formulation: With patient Time For Goal Achievement: 12/10/11 Potential to Achieve Goals: Good Pt will go Supine/Side to Sit: with supervision PT Goal: Supine/Side to Sit - Progress: Goal set today Pt will go Sit to Supine/Side: with supervision PT Goal: Sit to Supine/Side - Progress: Goal set today Pt will go Sit to Stand: with supervision PT Goal: Sit to Stand - Progress: Goal set today Pt will go Stand to Sit: with supervision PT Goal: Stand to Sit - Progress: Goal set today Pt will Ambulate: 51 - 150 feet;with supervision;with rolling walker PT Goal: Ambulate - Progress: Goal set today Pt will Go Up / Down Stairs: 3-5 stairs;with min assist;with least restrictive assistive device PT Goal: Up/Down Stairs - Progress: Goal set today  Visit Information  Last PT Received On: 12/05/11 Assistance Needed: +1    Subjective Data  Subjective: It feels better than when I had those blocks in there Patient Stated Goal: Walk without a walker or pain   Prior Functioning  Home Living Lives With: Spouse Available Help at Discharge: Family Type of Home: House Home Access: Stairs to enter Secretary/administrator of Steps: 3 Entrance Stairs-Rails: Right Home Layout: Able to live on main level with bedroom/bathroom Home Adaptive Equipment: Walker - rolling Prior Function Level of Independence: Independent with assistive device(s) Able to Take Stairs?: Yes Communication Communication: No difficulties    Cognition  Overall Cognitive Status: Appears within functional limits for tasks assessed/performed Arousal/Alertness: Awake/alert Orientation Level: Appears intact for tasks assessed Behavior During Session: [facility]  for tasks performed    Extremity/Trunk Assessment Right Upper Extremity Assessment RUE ROM/Strength/Tone: [facility] for  tasks assessed Left Upper Extremity Assessment LUE ROM/Strength/Tone: WFL for tasks assessed Right Lower Extremity Assessment RLE ROM/Strength/Tone: Deficits RLE ROM/Strength/Tone Deficits: -10 - 30 AAROM at knee with 2/5 quads Left Lower Extremity Assessment LLE ROM/Strength/Tone: Deficits LLE ROM/Strength/Tone Deficits: AROM at knee to 95.  strength WFL   Balance    End of Session PT - End of Session Equipment Utilized During Treatment: Gait belt;Right knee immobilizer Activity Tolerance: Patient tolerated treatment well Patient left: in chair;with call bell/phone within reach;with family/visitor present Nurse Communication: Mobility status CPM Right Knee CPM Right Knee: Off  GP     Ausar Georgiou 12/05/2011, 8:48 AM

## 2011-12-06 ENCOUNTER — Encounter (HOSPITAL_COMMUNITY): Payer: Self-pay | Admitting: Orthopedic Surgery

## 2011-12-06 LAB — CBC
Hemoglobin: 8.9 g/dL — ABNORMAL LOW (ref 12.0–15.0)
Platelets: 132 10*3/uL — ABNORMAL LOW (ref 150–400)
RBC: 3.23 MIL/uL — ABNORMAL LOW (ref 3.87–5.11)
WBC: 3.8 10*3/uL — ABNORMAL LOW (ref 4.0–10.5)

## 2011-12-06 LAB — BASIC METABOLIC PANEL
CO2: 26 mEq/L (ref 19–32)
Calcium: 8.4 mg/dL (ref 8.4–10.5)
Chloride: 101 mEq/L (ref 96–112)
Glucose, Bld: 163 mg/dL — ABNORMAL HIGH (ref 70–99)
Potassium: 3.4 mEq/L — ABNORMAL LOW (ref 3.5–5.1)
Sodium: 134 mEq/L — ABNORMAL LOW (ref 135–145)

## 2011-12-06 LAB — PROTIME-INR
INR: 1.86 — ABNORMAL HIGH (ref 0.00–1.49)
Prothrombin Time: 21.8 seconds — ABNORMAL HIGH (ref 11.6–15.2)

## 2011-12-06 MED ORDER — OXYCODONE HCL 5 MG PO TABS: 5.0000 mg | ORAL_TABLET | ORAL | Status: AC | PRN

## 2011-12-06 MED ORDER — ENOXAPARIN SODIUM 30 MG/0.3ML ~~LOC~~ SOLN: 30.0000 mg | Freq: Two times a day (BID) | SUBCUTANEOUS | Status: DC

## 2011-12-06 MED ORDER — WARFARIN SODIUM 5 MG PO TABS
5.0000 mg | ORAL_TABLET | Freq: Once | ORAL | Status: AC
  Administered 2011-12-06: 5 mg via ORAL
  Filled 2011-12-06: qty 1

## 2011-12-06 MED ORDER — METHOCARBAMOL 500 MG PO TABS: 500.0000 mg | ORAL_TABLET | Freq: Four times a day (QID) | ORAL | Status: AC | PRN

## 2011-12-06 MED ORDER — WARFARIN SODIUM 5 MG PO TABS: 5.0000 mg | ORAL_TABLET | Freq: Once | ORAL | Status: DC

## 2011-12-06 MED ORDER — POTASSIUM CHLORIDE CRYS ER 20 MEQ PO TBCR
40.0000 meq | EXTENDED_RELEASE_TABLET | Freq: Two times a day (BID) | ORAL | Status: AC
  Administered 2011-12-06 (×2): 40 meq via ORAL
  Filled 2011-12-06 (×2): qty 2

## 2011-12-06 NOTE — Progress Notes (Signed)
Physical Therapy Treatment Patient Details Name: [name] MRN: [id number] DOB: [birth year] Today's Date: 12/06/2011 Time: 4098-1191 PT Time Calculation (min): 28 min  PT Assessment / Plan / Recommendation Comments on Treatment Session  Pt limited by pain.  RN providing MEDs, Cold packs provided.  Ambulation deferred to later    Follow Up Recommendations  Home health PT    Barriers to Discharge        Equipment Recommendations  None recommended by OT;None recommended by PT    Recommendations for Other Services    Frequency 7X/week   Plan Discharge plan remains appropriate    Precautions / Restrictions Precautions Precautions: Knee Required Braces or Orthoses: Knee Immobilizer - Right Knee Immobilizer - Right: Discontinue once straight leg raise with < 10 degree lag Restrictions Weight Bearing Restrictions: No Other Position/Activity Restrictions: WBAT   Pertinent Vitals/Pain 9/10 with WB; MEds received, cold packs applied    Mobility  Bed Mobility Bed Mobility: Supine to Sit Supine to Sit: 4: Min assist Details for Bed Mobility Assistance: min assist for R LE with cues for sequence Transfers Transfers: Sit to Stand;Stand to Sit Sit to Stand: 4: Min assist;With upper extremity assist;From bed Stand to Sit: 4: Min assist;With armrests;With upper extremity assist;To chair/3-in-1 Details for Transfer Assistance: min VCs for hand placement and LE management. Ambulation/Gait Ambulation/Gait Assistance: 4: Min assist;3: Mod assist Ambulation Distance (Feet): 6 Feet Assistive device: Rolling walker Ambulation/Gait Assistance Details: min cues for posture and position from RW ; distance limited by pain Gait Pattern: Step-to pattern General Gait Details: Pt with decreased ability to WB on R LE 2* discomfort    Exercises Total Joint Exercises Ankle Circles/Pumps: AROM;10 reps;Both;Supine Quad Sets: AROM;10 reps;Both;Supine Heel Slides: 10 reps;AAROM;Supine;Right Straight  Leg Raises: AAROM;10 reps;Right;Supine   PT Diagnosis:    PT Problem List:   PT Treatment Interventions:     PT Goals Acute Rehab PT Goals PT Goal Formulation: With patient Time For Goal Achievement: 12/10/11 Potential to Achieve Goals: Good Pt will go Supine/Side to Sit: with supervision PT Goal: Supine/Side to Sit - Progress: Progressing toward goal Pt will go Sit to Supine/Side: with supervision PT Goal: Sit to Supine/Side - Progress: Progressing toward goal Pt will go Sit to Stand: with supervision PT Goal: Sit to Stand - Progress: Progressing toward goal Pt will go Stand to Sit: with supervision PT Goal: Stand to Sit - Progress: Progressing toward goal  Visit Information  Last PT Received On: 12/06/11 Assistance Needed: +1    Subjective Data  Subjective: The doctor said my knee looked really good when he unwrapped it Patient Stated Goal: Walk without a walker or pain   Cognition  Overall Cognitive Status: Appears within functional limits for tasks assessed/performed Arousal/Alertness: Awake/alert Orientation Level: Appears intact for tasks assessed Behavior During Session: [facility] for tasks performed    Balance     End of Session PT - End of Session Equipment Utilized During Treatment: Right knee immobilizer Activity Tolerance: Patient limited by pain Patient left: in chair;with call bell/phone within reach;with family/visitor present Nurse Communication: Mobility status   GP     Richell Corker 12/06/2011, 12:24 PM

## 2011-12-06 NOTE — Progress Notes (Signed)
Physical Therapy Treatment Patient Details Name: [name] MRN: [id number] DOB: 07/04/1940 Today's Date: 12/06/2011 Time: 6270-3500 PT Time Calculation (min): 13 min  PT Assessment / Plan / Recommendation Comments on Treatment Session  Pt continues ltd by pain with WB but very motivated    Follow Up Recommendations  Home health PT    Barriers to Discharge        Equipment Recommendations  None recommended by OT;None recommended by PT    Recommendations for Other Services    Frequency 7X/week   Plan Discharge plan remains appropriate    Precautions / Restrictions Precautions Precautions: Knee Required Braces or Orthoses: Knee Immobilizer - Right Knee Immobilizer - Right: Discontinue once straight leg raise with < 10 degree lag Restrictions Weight Bearing Restrictions: No Other Position/Activity Restrictions: WBAT   Pertinent Vitals/Pain 7/10    Mobility  Bed Mobility Bed Mobility: Supine to Sit Supine to Sit: 4: Min assist Details for Bed Mobility Assistance: min assist for R LE with cues for sequence Transfers Transfers: Sit to Stand;Stand to Sit Sit to Stand: 4: Min assist;From chair/3-in-1;With upper extremity assist;With armrests Stand to Sit: 4: Min assist;With armrests;With upper extremity assist;To chair/3-in-1 Details for Transfer Assistance: min VCs for hand placement and LE management. Ambulation/Gait Ambulation/Gait Assistance: 4: Min assist Ambulation Distance (Feet): 43 Feet Assistive device: Rolling walker Ambulation/Gait Assistance Details: cues for posture, position from RW and stride length Gait Pattern: Step-to pattern General Gait Details: Increased WB tolerance vs this am but pt continued to struggle with pain while WB    Exercises Total Joint Exercises Ankle Circles/Pumps: AROM;10 reps;Both;Supine Quad Sets: AROM;10 reps;Both;Supine Heel Slides: 10 reps;AAROM;Supine;Right Straight Leg Raises: AAROM;10 reps;Right;Supine   PT Diagnosis:      PT Problem List:   PT Treatment Interventions:     PT Goals Acute Rehab PT Goals PT Goal Formulation: With patient Time For Goal Achievement: 12/10/11 Potential to Achieve Goals: Good Pt will go Supine/Side to Sit: with supervision PT Goal: Supine/Side to Sit - Progress: Progressing toward goal Pt will go Sit to Supine/Side: with supervision PT Goal: Sit to Supine/Side - Progress: Progressing toward goal Pt will go Sit to Stand: with supervision PT Goal: Sit to Stand - Progress: Progressing toward goal Pt will go Stand to Sit: with supervision PT Goal: Stand to Sit - Progress: Progressing toward goal Pt will Ambulate: 51 - 150 feet;with supervision;with rolling walker PT Goal: Ambulate - Progress: Progressing toward goal  Visit Information  Last PT Received On: 12/06/11 Assistance Needed: +1    Subjective Data  Subjective: I'm feeling better, we can try to walk Patient Stated Goal: Walk without a walker or pain   Cognition  Overall Cognitive Status: Appears within functional limits for tasks assessed/performed Arousal/Alertness: Awake/alert Orientation Level: Appears intact for tasks assessed Behavior During Session: [facility] for tasks performed    Balance     End of Session PT - End of Session Equipment Utilized During Treatment: Right knee immobilizer Activity Tolerance: Patient limited by pain Patient left: in chair Nurse Communication: Mobility status   GP     [name] 12/06/2011, 12:31 PM

## 2011-12-06 NOTE — Progress Notes (Signed)
Physical Therapy Treatment Patient Details Name: BRIANN SARCHET MRN: [id number] DOB: 05/30/1941 Today's Date: 12/06/2011 Time: 9147-8295 PT Time Calculation (min): 34 min  PT Assessment / Plan / Recommendation Comments on Treatment Session  Pt very motivated to progress - hopes to d/c home in am    Follow Up Recommendations  Home health PT    Barriers to Discharge        Equipment Recommendations  None recommended by OT;None recommended by PT    Recommendations for Other Services    Frequency 7X/week   Plan Discharge plan remains appropriate    Precautions / Restrictions Precautions Precautions: Knee Required Braces or Orthoses: Knee Immobilizer - Right Knee Immobilizer - Right: Discontinue once straight leg raise with < 10 degree lag Restrictions Weight Bearing Restrictions: No Other Position/Activity Restrictions: WBAT   Pertinent Vitals/Pain 4/10; premedicated, ice packs provided    Mobility  Bed Mobility Bed Mobility: Supine to Sit Supine to Sit: 4: Min assist Details for Bed Mobility Assistance: min assist for R LE with cues for sequence Transfers Transfers: Sit to Stand;Stand to Sit Sit to Stand: 4: Min guard;With upper extremity assist;From bed Stand to Sit: 4: Min guard;With upper extremity assist;To chair/3-in-1;With armrests Details for Transfer Assistance: min VCs for hand placement and LE management. Ambulation/Gait Ambulation/Gait Assistance: 4: Min guard;4: Min Environmental consultant (Feet): 140 Feet Assistive device: Rolling walker Ambulation/Gait Assistance Details: cues for position from RW and posture Gait Pattern: Step-to pattern Stairs: Yes Stairs Assistance: 4: Min assist Stair Management Technique: One rail Right;Forwards;With crutches;Step to pattern Number of Stairs: 5  (2+3)    Exercises     PT Diagnosis:    PT Problem List:   PT Treatment Interventions:     PT Goals Acute Rehab PT Goals PT Goal Formulation: With patient Time  For Goal Achievement: 12/10/11 Potential to Achieve Goals: Good Pt will go Supine/Side to Sit: with supervision PT Goal: Supine/Side to Sit - Progress: Progressing toward goal Pt will go Sit to Supine/Side: with supervision PT Goal: Sit to Supine/Side - Progress: Progressing toward goal Pt will go Sit to Stand: with supervision PT Goal: Sit to Stand - Progress: Progressing toward goal Pt will go Stand to Sit: with supervision PT Goal: Stand to Sit - Progress: Progressing toward goal Pt will Ambulate: 51 - 150 feet;with supervision;with rolling walker PT Goal: Ambulate - Progress: Progressing toward goal Pt will Go Up / Down Stairs: 3-5 stairs;with min assist;with least restrictive assistive device PT Goal: Up/Down Stairs - Progress: Progressing toward goal  Visit Information  Last PT Received On: 12/06/11 Assistance Needed: +1    Subjective Data  Subjective: No new complaints Patient Stated Goal: Walk without a walker or pain   Cognition  Overall Cognitive Status: Appears within functional limits for tasks assessed/performed Arousal/Alertness: Awake/alert Orientation Level: Appears intact for tasks assessed Behavior During Session: [facility] for tasks performed    Balance     End of Session PT - End of Session Equipment Utilized During Treatment: Gait belt;Right knee immobilizer Activity Tolerance: Patient tolerated treatment well Patient left: in chair;with call bell/phone within reach;with family/visitor present Nurse Communication: Mobility status   GP     Sufyan Meidinger 12/06/2011, 3:57 PM

## 2011-12-06 NOTE — Discharge Instructions (Addendum)
Dr. Ollen Gross Total Joint Specialist [facility] [address] [place], [place] [address] [phone]  TOTAL KNEE REPLACEMENT POSTOPERATIVE DIRECTIONS    Knee Rehabilitation, Guidelines Following Surgery  Results after knee surgery are often greatly improved when you follow the exercise, range of motion and muscle strengthening exercises prescribed by your doctor. Safety measures are also important to protect the knee from further injury. Any time any of these exercises cause you to have increased pain or swelling in your knee joint, decrease the amount until you are comfortable again and slowly increase them. If you have problems or questions, call your caregiver or physical therapist for advice.   HOME CARE INSTRUCTIONS  Remove items at home which could result in a fall. This includes throw rugs or furniture in walking pathways.  Continue medications as instructed at time of discharge. You may have some home medications which will be placed on hold until you complete the course of blood thinner medication.  You may start showering once you are discharged home but do not submerge the incision under water. Just pat the incision dry and apply a dry gauze dressing on daily. Walk with walker as instructed.   Use walker as long as suggested by your caregivers.  Avoid periods of inactivity such as sitting longer than an hour when not asleep. This helps prevent blood clots.  You may put full weight on your legs and walk as much as is comfortable.  You may resume a sexual relationship in one month or when given the OK by your doctor.  You may return to work once you are cleared by your doctor.  Do not drive a car for 6 weeks or until released by you surgeon.   Do not drive while taking narcotics.  Wear the elastic stockings for three weeks following surgery during the day but you may remove then at night. Make sure you keep all of your appointments after your  operation with all of your doctors and caregivers. You should call the office at the above phone number and make an appointment for approximately two weeks after the date of your surgery. Change the dressing daily and reapply a dry dressing each time. Please pick up a stool softener and laxative for home use as long as you are requiring pain medications.  Continue to use ice on the knee for pain and swelling from surgery. You may notice swelling that will progress down to the foot and ankle.  This is normal after surgery.  Elevate the leg when you are not up walking on it.   It is important for you to complete the blood thinner medication as prescribed by your doctor.  Continue to use the breathing machine which will help keep your temperature down.  It is common for your temperature to cycle up and down following surgery, especially at night when you are not up moving around and exerting yourself.  The breathing machine keeps your lungs expanded and your temperature down.  RANGE OF MOTION AND STRENGTHENING EXERCISES  Rehabilitation of the knee is important following a knee injury or an operation. After just a few days of immobilization, the muscles of the thigh which control the knee become weakened and shrink (atrophy). Knee exercises are designed to build up the tone and strength of the thigh muscles and to improve knee motion. Often times heat used for twenty to thirty minutes before working out will loosen up your tissues and help with improving the range  of motion but do not use heat for the first two weeks following surgery. These exercises can be done on a training (exercise) mat, on the floor, on a table or on a bed. Use what ever works the best and is most comfortable for you Knee exercises include:  Leg Lifts - While your knee is still immobilized in a splint or cast, you can do straight leg raises. Lift the leg to 60 degrees, hold for 3 sec, and slowly lower the leg. Repeat 10-20 times 2-3  times daily. Perform this exercise against resistance later as your knee gets better.  Quad and Hamstring Sets - Tighten up the muscle on the front of the thigh (Quad) and hold for 5-10 sec. Repeat this 10-20 times hourly. Hamstring sets are done by pushing the foot backward against an object and holding for 5-10 sec. Repeat as with quad sets.  A rehabilitation program following serious knee injuries can speed recovery and prevent re-injury in the future due to weakened muscles. Contact your doctor or a physical therapist for more information on knee rehabilitation.   SKILLED REHAB INSTRUCTIONS: If the patient is transferred to a skilled rehab facility following release from the hospital, a list of the current medications will be sent to the facility for the patient to continue.  When discharged from the skilled rehab facility, please have the facility set up the patient's Home Health Physical Therapy prior to being released. Also, the skilled facility will be responsible for providing the patient with their medications at time of release from the facility to include their pain medication, the muscle relaxants, and their blood thinner medication. If the patient is still at the rehab facility at time of the two week follow up appointment, the skilled rehab facility will also need to assist the patient in arranging follow up appointment in our office and any transportation needs.  MAKE SURE YOU:  Understand these instructions.  Will watch your condition.  Will get help right away if you are not doing well or get worse.  Document Released: 05/27/2005 Document Revised: 05/16/2011 Document Reviewed: 11/14/2006  [facility] Patient Information 2012 [place], [place].   Take Coumadin for three weeks and then discontinue.  The dose may need to be adjusted based upon the INR.  Please follow the INR and titrate Coumadin dose for a therapeutic range between 2.0 and 3.0 INR.  After completing the three weeks of  Coumadin, the patient may stop the Coumadin and resume their 81 mg Aspirin daily.  Continue Lovenox injections until the INR is therapeutic at or greater than 2.0.  When INR reaches the therapeutic level of equal to or greater than 2.0, the patient may discontinue the Lovenox injections.

## 2011-12-06 NOTE — Progress Notes (Signed)
Subjective: 2 Days Post-Op Procedure(s) (LRB): TOTAL KNEE REVISION REIMPLANTATION (Right) Patient reports pain as moderate and severe.   Patient seen in rounds with Dr. Lequita Halt. Patient is having problems with pain in the knee, requiring pain medications.  She did a fair amount of therapy for day one and was hurting more. DC the CPM. Plan is to go Home after hospital stay.  Objective: Vital signs in last 24 hours: Temp:  [97.2 F (36.2 C)-100.7 F (38.2 C)] 98.3 F (36.8 C) (06/28 0610) Pulse Rate:  [76-98] 87  (06/28 0610) Resp:  [12-16] 14  (06/28 0610) BP: (97-136)/(58-71) 125/71 mmHg (06/28 0610) SpO2:  [93 %-97 %] 96 % (06/28 0610) Weight:  [83.915 kg (185 lb)] 83.915 kg (185 lb) (06/27 0909)  Intake/Output from previous day:  Intake/Output Summary (Last 24 hours) at 12/06/11 0824 Last data filed at 12/06/11 0610  Gross per 24 hour  Intake 3508.67 ml  Output   2150 ml  Net 1358.67 ml    Intake/Output this shift:    Labs:  Basename 12/06/11 0555 12/05/11 0500  HGB 8.9* 9.5*    Basename 12/06/11 0555 12/05/11 0500  WBC 3.8* 5.3  RBC 3.23* 3.41*  HCT 27.6* 29.5*  PLT 132* 153    Basename 12/06/11 0555 12/05/11 0500  NA 134* 136  K 3.4* 4.3  CL 101 101  CO2 26 27  BUN 8 12  CREATININE 0.68 0.64  GLUCOSE 163* 196*  CALCIUM 8.4 8.5    Basename 12/06/11 0555 12/05/11 0500  LABPT -- --  INR 1.86* 1.15    EXAM General - Patient is Alert, Appropriate and Oriented Extremity - Neurovascular intact Sensation intact distally Dorsiflexion/Plantar flexion intact Dressing/Incision - clean, dry, no drainage, healing Motor Function - intact, moving foot and toes well on exam.   Past Medical History  Diagnosis Date  . DJD (degenerative joint disease)     generalized  . Dyslipidemia   . Diabetes mellitus     borderline  . Squamous cell carcinoma   . Osteoarthritis   . HTN (hypertension)   . Spinal stenosis of lumbar region 04/17/2011  . Dyspnea 09-27-11      with extreme exertion.  Marland Kitchen COPD (chronic obstructive pulmonary disease) 09-27-11    mild  . Rotator cuff syndrome 09-27-11    rotator cuff pain sometimes  . Carpal tunnel syndrome, right 09-27-11    right hand  . 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 Gallstones 09-27-11    hx. not bothered    Assessment/Plan: 2 Days Post-Op Procedure(s) (LRB): TOTAL KNEE REVISION REIMPLANTATION (Right) Active Problems:  * No active hospital problems. *    Up with therapy Discharge home with home health  DVT Prophylaxis - Lovenox and Coumadin - INR 1.86 Weight-Bearing as tolerated to right leg  Harun Brumley 12/06/2011, 8:24 AM

## 2011-12-06 NOTE — Progress Notes (Addendum)
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   Medications:  Scheduled:    . acetaminophen  1,000 mg Intravenous Q6H  .  ceFAZolin (ANCEF) IV  1 g Intravenous Q6H  . coumadin book  1 each Does not apply Once  . docusate sodium  100 mg Oral BID  . enoxaparin  30 mg Subcutaneous Q12H  . iron polysaccharides  150 mg Oral Daily  . labetalol  300 mg Oral BID  . potassium chloride SA  20 mEq Oral Daily  . potassium chloride  40 mEq Oral BID  . rifampin  300 mg Oral Q supper  . warfarin  7.5 mg Oral ONCE-1800  . warfarin   Does not apply Once  . Warfarin - Pharmacist Dosing Inpatient   Does not apply q1800  . DISCONTD: warfarin  5 mg Oral ONCE-1800    Assessment:  71 year-old female S/P re-implantation of right TKA on 6/26, receiving Warfarin and Enoxaparin for DVT prophylaxis  Concomitant Rifampin noted.   Anticipate larger than average Warfarin dosage requirement while on this antibiotic.  During recent admission (April 2013) with patient taking Rifampin, the INR responded slowly to Warfarin 7.5 mg doses.  INR has increased rapidly from 1.15 to 1.86  Warfarin doses this admission:  5 mg, 7.5 mg  Goal of Therapy:  INR 2-3  Plan:   Warfarin 5 mg today.  Continue Enoxaparin 30 mg every 12 hours  as ordered by surgeon.  Daily PT/INR.  Main points of Coumadin therapy were reviewed with [name] and handout provided.  She demonstrated good understanding.  [name] 12/06/2011,11:19 AM

## 2011-12-07 LAB — PROTIME-INR: Prothrombin Time: 27 seconds — ABNORMAL HIGH (ref 11.6–15.2)

## 2011-12-07 LAB — CBC
MCH: 27.6 pg (ref 26.0–34.0)
MCHC: 32.1 g/dL (ref 30.0–36.0)
MCV: 86 fL (ref 78.0–100.0)
Platelets: 153 10*3/uL (ref 150–400)

## 2011-12-07 LAB — WOUND CULTURE: Culture: NO GROWTH

## 2011-12-07 LAB — BASIC METABOLIC PANEL
BUN: 7 mg/dL (ref 6–23)
Chloride: 103 mEq/L (ref 96–112)
Creatinine, Ser: 0.61 mg/dL (ref 0.50–1.10)
GFR calc Af Amer: >90 mL/min (ref >90)
GFR calc non Af Amer: >90 mL/min (ref >90)
Glucose, Bld: 113 mg/dL — ABNORMAL HIGH (ref 70–99)

## 2011-12-07 MED ORDER — WARFARIN SODIUM 2.5 MG PO TABS
2.5000 mg | ORAL_TABLET | Freq: Once | ORAL | Status: DC
  Filled 2011-12-07: qty 1

## 2011-12-07 NOTE — Progress Notes (Signed)
Discharged from floor via w/c, spouse with pt. No changes in assessment. Shenna Brissette   

## 2011-12-07 NOTE — Progress Notes (Addendum)
CM spoke with patient to confirm dc plan. Per pt [facility] to provide [facility] services. Patient's spouse at bedside to assist in home care. Patient has DME.    Leonie Green [phone]

## 2011-12-07 NOTE — Progress Notes (Addendum)
Subjective: 3 Days Post-Op Procedure(s) (LRB): TOTAL KNEE REVISION (Right) Patient reports pain as mild.   Plan is to go Home after hospital stay.  Objective: Vital signs in last 24 hours: Temp:  [97.9 F (36.6 C)-98 F (36.7 C)] 98 F (36.7 C) (06/29 0653) Pulse Rate:  [81-88] 81  (06/29 0653) Resp:  [14-16] 16  (06/29 0653) BP: (124-151)/(74-90) 149/90 mmHg (06/29 0653) SpO2:  [94 %-95 %] 94 % (06/29 0653)  Intake/Output from previous day:  Intake/Output Summary (Last 24 hours) at 12/07/11 0759 Last data filed at 12/07/11 0700  Gross per 24 hour  Intake 1873.34 ml  Output   1250 ml  Net 623.34 ml    Intake/Output this shift:    Labs:  Basename 12/07/11 0405 12/06/11 0555 12/05/11 0500  HGB 8.9* 8.9* 9.5*    Basename 12/07/11 0405 12/06/11 0555  WBC 3.6* 3.8*  RBC 3.22* 3.23*  HCT 27.7* 27.6*  PLT 153 132*    Basename 12/07/11 0405 12/06/11 0555  NA 136 134*  K 3.9 3.4*  CL 103 101  CO2 27 26  BUN 7 8  CREATININE 0.61 0.68  GLUCOSE 113* 163*  CALCIUM 8.8 8.4    Basename 12/07/11 0405 12/06/11 0555  LABPT -- --  INR 2.45* 1.86*    EXAM General - Patient is Alert, Appropriate and Oriented Extremity - Neurologically intact Neurovascular intact No cellulitis present Compartment soft Dressing/Incision - clean, dry, no drainage Motor Function - intact, moving foot and toes well on exam.   Past Medical History  Diagnosis Date  . DJD (degenerative joint disease)     generalized  . Dyslipidemia   . Diabetes mellitus     borderline  . Squamous cell carcinoma   . Osteoarthritis   . HTN (hypertension)   . Spinal stenosis of lumbar region 04/17/2011  . Dyspnea 09-27-11    with extreme exertion.  Marland Kitchen COPD (chronic obstructive pulmonary disease) 09-27-11    mild  . Rotator cuff syndrome 09-27-11    rotator cuff pain sometimes  . Carpal tunnel syndrome, right 09-27-11    right hand  . 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 Gallstones 09-27-11      hx. not bothered    Assessment/Plan: 3 Days Post-Op Procedure(s) (LRB): TOTAL KNEE REVISION (Right) Active Problems:  * No active hospital problems. *    Discharge home with home health  DVT Prophylaxis - Coumadin Weight-Bearing as tolerated to right leg D/C Lovenox as Coumadin is therapeutic  [name] 12/07/2011, 7:59 AM  Pt with asymptomatic blood loss anemia. Will follow hemoglobin and assess for development of symptoms

## 2011-12-07 NOTE — Progress Notes (Signed)
Physical Therapy Treatment Patient Details Name: [name] MRN: [id number] DOB: [birth year] Today's Date: 12/07/2011 Time: 0925-1005 PT Time Calculation (min): 40 min  PT Assessment / Plan / Recommendation Comments on Treatment Session  Good progress with less assistiance/cues needed with mobiltiy. Improved knee AAROM.     Follow Up Recommendations  Home health PT       Equipment Recommendations  None recommended by PT;None recommended by OT       Frequency 7X/week   Plan Discharge plan remains appropriate;Frequency remains appropriate    Precautions / Restrictions Precautions Precautions: Knee Required Braces or Orthoses: Knee Immobilizer - Right Knee Immobilizer - Right: Discontinue once straight leg raise with < 10 degree lag Restrictions Other Position/Activity Restrictions: WBAT right LE    Pertinent Vitals/Pain Reports some pain with mobility in right knee, "not bad". Was premedicated. Cryo applied and end of session for decreased pain and swelling. RN notified.    Mobility  Transfers Transfers: Sit to Stand;Stand to Sit Sit to Stand: 6: Modified independent (Device/Increase time);From chair/3-in-1;From toilet;With upper extremity assist;With armrests Stand to Sit: 6: Modified independent (Device/Increase time);With upper extremity assist;With armrests;To chair/3-in-1;To toilet Details for Transfer Assistance: No cues or assitance needed with transfers Ambulation/Gait Ambulation/Gait Assistance: 6: Modified independent (Device/Increase time) Ambulation Distance (Feet): 140 Feet Assistive device: Rolling walker Ambulation/Gait Assistance Details: cues for posture and walker position x1 each Gait Pattern: Step-through pattern;Decreased stride length;Antalgic;Decreased stance time - right;Decreased step length - left Stairs Assistance: 6: Modified independent (Device/Increase time) Stair Management Technique: One rail Right;Forwards;Step to pattern;With  crutches Number of Stairs: 3     Exercises Total Joint Exercises Ankle Circles/Pumps: AROM;Both;10 reps;Supine Quad Sets: AROM;Strengthening;Right;10 reps;Supine Short Arc Quad: AAROM;Strengthening;Right;10 reps;Supine Heel Slides: AAROM;Strengthening;Right;10 reps;Supine Hip ABduction/ADduction: AAROM;Strengthening;Right;10 reps;Supine Straight Leg Raises: AAROM;Strengthening;Right;10 reps;Supine Goniometric ROM: supine right knee AAROM 6-55 degrees flexion    PT Goals Acute Rehab PT Goals PT Goal: Sit to Stand - Progress: Met PT Goal: Stand to Sit - Progress: Met PT Goal: Ambulate - Progress: Met PT Goal: Up/Down Stairs - Progress: Met  Visit Information  Last PT Received On: 12/07/11 Assistance Needed: +1    Subjective Data  Subjective: No new complaints, agreeable to therapy today. Pt ready to go home later today.   Cognition  Overall Cognitive Status: Appears within functional limits for tasks assessed/performed Arousal/Alertness: Awake/alert Orientation Level: Appears intact for tasks assessed Behavior During Session: [facility] for tasks performed       End of Session PT - End of Session Equipment Utilized During Treatment: Gait belt;Right knee immobilizer Activity Tolerance: Patient tolerated treatment well Patient left: in chair;with call bell/phone within reach;with family/visitor present Nurse Communication: Mobility status   Sallyanne Kuster 12/07/2011, 12:30 PM  Sallyanne Kuster, PTA Office- [phone]

## 2011-12-08 DIAGNOSIS — M48061 Spinal stenosis, lumbar region without neurogenic claudication: Secondary | ICD-10-CM | POA: Diagnosis not present

## 2011-12-08 DIAGNOSIS — E119 Type 2 diabetes mellitus without complications: Secondary | ICD-10-CM | POA: Diagnosis not present

## 2011-12-08 DIAGNOSIS — T8450XA Infection and inflammatory reaction due to unspecified internal joint prosthesis, initial encounter: Secondary | ICD-10-CM | POA: Diagnosis not present

## 2011-12-08 DIAGNOSIS — Z96659 Presence of unspecified artificial knee joint: Secondary | ICD-10-CM | POA: Diagnosis not present

## 2011-12-08 DIAGNOSIS — Z4789 Encounter for other orthopedic aftercare: Secondary | ICD-10-CM | POA: Diagnosis not present

## 2011-12-08 DIAGNOSIS — I10 Essential (primary) hypertension: Secondary | ICD-10-CM | POA: Diagnosis not present

## 2011-12-09 DIAGNOSIS — Z96659 Presence of unspecified artificial knee joint: Secondary | ICD-10-CM | POA: Diagnosis not present

## 2011-12-09 DIAGNOSIS — I10 Essential (primary) hypertension: Secondary | ICD-10-CM | POA: Diagnosis not present

## 2011-12-09 DIAGNOSIS — E119 Type 2 diabetes mellitus without complications: Secondary | ICD-10-CM | POA: Diagnosis not present

## 2011-12-09 DIAGNOSIS — Z4789 Encounter for other orthopedic aftercare: Secondary | ICD-10-CM | POA: Diagnosis not present

## 2011-12-09 DIAGNOSIS — M48061 Spinal stenosis, lumbar region without neurogenic claudication: Secondary | ICD-10-CM | POA: Diagnosis not present

## 2011-12-09 LAB — ANAEROBIC CULTURE

## 2011-12-10 DIAGNOSIS — Z4789 Encounter for other orthopedic aftercare: Secondary | ICD-10-CM | POA: Diagnosis not present

## 2011-12-10 DIAGNOSIS — E119 Type 2 diabetes mellitus without complications: Secondary | ICD-10-CM | POA: Diagnosis not present

## 2011-12-10 DIAGNOSIS — M48061 Spinal stenosis, lumbar region without neurogenic claudication: Secondary | ICD-10-CM | POA: Diagnosis not present

## 2011-12-10 DIAGNOSIS — Z96659 Presence of unspecified artificial knee joint: Secondary | ICD-10-CM | POA: Diagnosis not present

## 2011-12-10 DIAGNOSIS — I10 Essential (primary) hypertension: Secondary | ICD-10-CM | POA: Diagnosis not present

## 2011-12-11 DIAGNOSIS — E119 Type 2 diabetes mellitus without complications: Secondary | ICD-10-CM | POA: Diagnosis not present

## 2011-12-11 DIAGNOSIS — I10 Essential (primary) hypertension: Secondary | ICD-10-CM | POA: Diagnosis not present

## 2011-12-11 DIAGNOSIS — Z4789 Encounter for other orthopedic aftercare: Secondary | ICD-10-CM | POA: Diagnosis not present

## 2011-12-11 DIAGNOSIS — Z96659 Presence of unspecified artificial knee joint: Secondary | ICD-10-CM | POA: Diagnosis not present

## 2011-12-11 DIAGNOSIS — M48061 Spinal stenosis, lumbar region without neurogenic claudication: Secondary | ICD-10-CM | POA: Diagnosis not present

## 2011-12-13 DIAGNOSIS — I10 Essential (primary) hypertension: Secondary | ICD-10-CM | POA: Diagnosis not present

## 2011-12-16 DIAGNOSIS — Z96659 Presence of unspecified artificial knee joint: Secondary | ICD-10-CM | POA: Diagnosis not present

## 2011-12-16 DIAGNOSIS — M48061 Spinal stenosis, lumbar region without neurogenic claudication: Secondary | ICD-10-CM | POA: Diagnosis not present

## 2011-12-16 DIAGNOSIS — Z4789 Encounter for other orthopedic aftercare: Secondary | ICD-10-CM | POA: Diagnosis not present

## 2011-12-16 DIAGNOSIS — I10 Essential (primary) hypertension: Secondary | ICD-10-CM | POA: Diagnosis not present

## 2011-12-16 DIAGNOSIS — E119 Type 2 diabetes mellitus without complications: Secondary | ICD-10-CM | POA: Diagnosis not present

## 2011-12-16 NOTE — Discharge Summary (Signed)
Physician Discharge Summary   Patient ID: [name] MRN: [id number] DOB/AGE: 1940-09-23 71 y.o.  Admit date: 12/04/2011 Discharge date: 12/07/2011  Primary Diagnosis: right knee arthroplasty resection with antibiotic spacer placement  Admission Diagnoses:  Past Medical History  Diagnosis Date  . DJD (degenerative joint disease)     generalized  . Dyslipidemia   . Diabetes mellitus     borderline  . Squamous cell carcinoma   . Osteoarthritis   . HTN (hypertension)   . Spinal stenosis of lumbar region 04/17/2011  . Dyspnea 09-27-11    with extreme exertion.  Marland Kitchen COPD (chronic obstructive pulmonary disease) 09-27-11    mild  . Rotator cuff syndrome 09-27-11    rotator cuff pain sometimes  . Carpal tunnel syndrome, right 09-27-11    right hand  . 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 Gallstones 09-27-11    hx. not bothered   Discharge Diagnoses:   Active Problems:  * No active hospital problems. *   Procedure:  Procedure(s) (LRB): TOTAL KNEE REVISION (Right)   Consults: None  HPI: [name] is a 71 year old female, complex history in regard to her right knee. She has had a recent infection and had a resection arthroplasty with placement of an antibiotic spacer. She has had normalization of her labs, and the infection is cleared. She presents now for total knee arthroplasty reimplantation.  Laboratory Data: Hospital Outpatient Visit on 11/26/2011  Component Date Value Range Status  . aPTT 11/26/2011 27  24 - 37 seconds Final  . WBC 11/26/2011 6.1  4.0 - 10.5 K/uL Final  . RBC 11/26/2011 4.46  3.87 - 5.11 MIL/uL Final  . Hemoglobin 11/26/2011 12.2  12.0 - 15.0 g/dL Final  . HCT 40/98/1191 39.1  36.0 - 46.0 % Final  . MCV 11/26/2011 87.7  78.0 - 100.0 fL Final  . MCH 11/26/2011 27.4  26.0 - 34.0 pg Final  . MCHC 11/26/2011 31.2  30.0 - 36.0 g/dL Final  . RDW 47/82/9562 18.7* 11.5 - 15.5 % Final  . Platelets 11/26/2011 246  150 - 400 K/uL Final  . Sodium  11/26/2011 140  135 - 145 mEq/L Final  . Potassium 11/26/2011 4.0  3.5 - 5.1 mEq/L Final  . Chloride 11/26/2011 100  96 - 112 mEq/L Final  . CO2 11/26/2011 31  19 - 32 mEq/L Final  . Glucose, Bld 11/26/2011 118* 70 - 99 mg/dL Final  . BUN 13/01/6577 15  6 - 23 mg/dL Final  . Creatinine, Ser 11/26/2011 0.72  0.50 - 1.10 mg/dL Final  . Calcium 46/96/2952 10.0  8.4 - 10.5 mg/dL Final  . Total Protein 11/26/2011 7.4  6.0 - 8.3 g/dL Final  . Albumin 84/13/2440 3.9  3.5 - 5.2 g/dL Final  . AST 04/06/2535 18  0 - 37 U/L Final  . ALT 11/26/2011 <5  0 - 35 U/L Final  . Alkaline Phosphatase 11/26/2011 107  39 - 117 U/L Final  . Total Bilirubin 11/26/2011 0.1* 0.3 - 1.2 mg/dL Final  . GFR calc non Af Amer 11/26/2011 86* >90 mL/min Final  . GFR calc Af Amer 11/26/2011 >90  >90 mL/min Final   Comment:                                 The eGFR has been calculated  using the CKD EPI equation.                          This calculation has not been                          validated in all clinical                          situations.                          eGFR's persistently                          <90 mL/min signify                          possible Chronic Kidney Disease.  Marland Kitchen Prothrombin Time 11/26/2011 13.6  11.6 - 15.2 seconds Final  . INR 11/26/2011 1.02  0.00 - 1.49 Final  . MRSA, PCR 11/26/2011 NEGATIVE  NEGATIVE Final  . Staphylococcus aureus 11/26/2011 NEGATIVE  NEGATIVE Final   Comment:                                 The Xpert SA Assay (FDA                          approved for NASAL specimens                          only), is one component of                          a comprehensive surveillance                          program.  It is not intended                          to diagnose infection nor to                          guide or monitor treatment.  . Color, Urine 11/26/2011 YELLOW  YELLOW Final  . APPearance 11/26/2011 CLEAR  CLEAR Final  . Specific  Gravity, Urine 11/26/2011 1.025  1.005 - 1.030 Final  . pH 11/26/2011 5.5  5.0 - 8.0 Final  . Glucose, UA 11/26/2011 NEGATIVE  NEGATIVE mg/dL Final  . Hgb urine dipstick 11/26/2011 NEGATIVE  NEGATIVE Final  . Bilirubin Urine 11/26/2011 NEGATIVE  NEGATIVE Final  . Ketones, ur 11/26/2011 NEGATIVE  NEGATIVE mg/dL Final  . Protein, ur 96/09/5407 NEGATIVE  NEGATIVE mg/dL Final  . Urobilinogen, UA 11/26/2011 0.2  0.0 - 1.0 mg/dL Final  . Nitrite 81/19/1478 NEGATIVE  NEGATIVE Final  . Leukocytes, UA 11/26/2011 NEGATIVE  NEGATIVE Final   MICROSCOPIC NOT DONE ON URINES WITH NEGATIVE PROTEIN, BLOOD, LEUKOCYTES, NITRITE, OR GLUCOSE <1000 mg/dL.   No results found for this basename: HGB:5 in the last 72 hours No results found for this basename: WBC:2,RBC:2,HCT:2,PLT:2 in the last 72 hours No results found for this basename: NA:2,K:2,CL:2,CO2:2,BUN:2,CREATININE:2,GLUCOSE:2,CALCIUM:2 in  the last 72 hours No results found for this basename: LABPT:2,INR:2 in the last 72 hours  X-Rays:No results found.  EKG: Orders placed during the hospital encounter of 12/04/11  . EKG     Hospital Course: Patient was admitted to [facility] and taken to the OR and underwent the above state procedure without complications.  Patient tolerated the procedure well and was later transferred to the recovery room and then to the orthopaedic floor for postoperative care.  They were given PO and IV analgesics for pain control following their surgery.  They were given 24 hours of postoperative antibiotics and started on DVT prophylaxis in the form of Lovenox and Coumadin.   PT and OT were ordered for total joint protocol.  Discharge planning consulted to help with postop disposition and equipment needs.  Patient had a decent night on the evening of surgery and started to get up OOB with therapy on day one.  PCA Morphine was discontinued and they were weaned over to PO meds.  Hemovac drain was pulled without difficulty.   Continued to work with therapy into day two. Patient was having problems with pain in the knee, requiring pain medications. She did a fair amount of therapy for day one and was hurting more. We DC'd the CPM.  Dressing was changed on day two and the incision was healing well.  By day three, the patient had progressed with therapy and meeting their goals.  Incision was healing well.  Patient was seen in rounds and was ready to go home.  Pt with asymptomatic blood loss anemia. Will follow hemoglobin and assess for development of symptoms.   Discharge Medications: Prior to Admission medications   Medication Sig Start Date End Date Taking? Authorizing Provider  Ferrous Sulfate (IRON) 325 (65 FE) MG TABS Take 65 mg by mouth 2 (two) times a week.   Yes Historical Provider, MD  labetalol (NORMODYNE) 300 MG tablet Take 300 mg by mouth 2 (two) times daily.    Yes Historical Provider, MD  potassium chloride SA (K-DUR,KLOR-CON) 20 MEQ tablet Take 20 mEq by mouth daily.   Yes Historical Provider, MD  quinapril-hydrochlorothiazide (ACCURETIC) 20-12.5 MG per tablet Take 1 tablet by mouth every morning.    Yes Historical Provider, MD  triamcinolone cream (KENALOG) 0.1 % Apply 1 application topically 2 (two) times daily as needed. Rash   Yes Historical Provider, MD  enoxaparin (LOVENOX) 30 MG/0.3ML injection Inject 0.3 mLs (30 mg total) into the skin every 12 (twelve) hours. Continue Lovenox injections until the INR is therapeutic at or greater than 2.0.  When INR reaches the therapeutic level of equal to or greater than 2.0, the patient may discontinue the Lovenox injections. 12/06/11   Ronniesha Seibold Julien Girt, PA  methocarbamol (ROBAXIN) 500 MG tablet Take 1 tablet (500 mg total) by mouth every 6 (six) hours as needed. 12/06/11 12/16/11  Azuree Minish, PA  oxyCODONE (OXY IR/ROXICODONE) 5 MG immediate release tablet Take 1-2 tablets (5-10 mg total) by mouth every 4 (four) hours as needed for pain. 12/06/11 12/16/11   Lumi Winslett, PA  warfarin (COUMADIN) 5 MG tablet Take 1 tablet (5 mg total) by mouth one time only at 6 PM. Take Coumadin for three weeks and then discontinue.  The dose may need to be adjusted based upon the INR.  Please follow the INR and titrate Coumadin dose for a therapeutic range between 2.0 and 3.0 INR.  After completing the three weeks of Coumadin, the patient may stop the  Coumadin and resume their 81 mg Aspirin daily. 12/06/11 12/05/12  Manny Vitolo, PA    Diet: Cardiac diet Activity:WBAT Follow-up:in 2 weeks Disposition - Home Discharged Condition: good   Discharge Orders    Future Appointments: Provider: Department: Dept Phone: Center:   01/15/2012 10:45 AM Ginnie Smart, MD [facility] [phone] [facility]     Future Orders Please Complete By Expires   Diet - low sodium heart healthy      Call MD / Call 911      Comments:   If you experience chest pain or shortness of breath, CALL 911 and be transported to the hospital emergency room.  If you develope a fever above 101 F, pus (white drainage) or increased drainage or redness at the wound, or calf pain, call your surgeon's office.   Discharge instructions      Comments:   Pick up stool softner and laxative for home. Do not submerge incision under water. May shower. Continue to use ice for pain and swelling from surgery.  Take Coumadin for three weeks and then discontinue.  The dose may need to be adjusted based upon the INR.  Please follow the INR and titrate Coumadin dose for a therapeutic range between 2.0 and 3.0 INR.  After completing the three weeks of Coumadin, the patient may stop the Coumadin and resume their 81 mg Aspirin daily.  Continue Lovenox injections until the INR is therapeutic at or greater than 2.0.  When INR reaches the therapeutic level of equal to or greater than 2.0, the patient may discontinue the Lovenox injections.   Constipation Prevention      Comments:   Drink plenty of  fluids.  Prune juice may be helpful.  You may use a stool softener, such as Colace (over the counter) 100 mg twice a day.  Use MiraLax (over the counter) for constipation as needed.   Increase activity slowly as tolerated      Patient may shower      Comments:   You may shower without a dressing once there is no drainage.  Do not wash over the wound.  If drainage remains, do not shower until drainage stops.   Driving restrictions      Comments:   No driving until released by the physician.   Lifting restrictions      Comments:   No lifting until released by the physician.   TED hose      Comments:   Use stockings (TED hose) for 3 weeks on both leg(s).  You may remove them at night for sleeping.   Change dressing      Comments:   Change dressing daily with sterile 4 x 4 inch gauze dressing and apply TED hose. Do not submerge the incision under water.   Do not put a pillow under the knee. Place it under the heel.      Do not sit on low chairs, stoools or toilet seats, as it may be difficult to get up from low surfaces        Medication List  As of 12/16/2011 10:38 AM   STOP taking these medications         aspirin 81 MG tablet      ceFAZolin 1-5 GM-%      fluconazole 100 MG tablet      omega-3 acid ethyl esters 1 G capsule      ONE-A-DAY MENOPAUSE FORMULA Tabs      oxycodone 5 MG capsule  rifampin 300 MG capsule         TAKE these medications         enoxaparin 30 MG/0.3ML injection   Commonly known as: LOVENOX   Inject 0.3 mLs (30 mg total) into the skin every 12 (twelve) hours. Continue Lovenox injections until the INR is therapeutic at or greater than 2.0.  When INR reaches the therapeutic level of equal to or greater than 2.0, the patient may discontinue the Lovenox injections.      Iron 325 (65 FE) MG Tabs   Take 65 mg by mouth 2 (two) times a week.      labetalol 300 MG tablet   Commonly known as: NORMODYNE   Take 300 mg by mouth 2 (two) times daily.       methocarbamol 500 MG tablet   Commonly known as: ROBAXIN   Take 1 tablet (500 mg total) by mouth every 6 (six) hours as needed.      oxyCODONE 5 MG immediate release tablet   Commonly known as: Oxy IR/ROXICODONE   Take 1-2 tablets (5-10 mg total) by mouth every 4 (four) hours as needed for pain.      potassium chloride SA 20 MEQ tablet   Commonly known as: K-DUR,KLOR-CON   Take 20 mEq by mouth daily.      quinapril-hydrochlorothiazide 20-12.5 MG per tablet   Commonly known as: ACCURETIC   Take 1 tablet by mouth every morning.      triamcinolone cream 0.1 %   Commonly known as: KENALOG   Apply 1 application topically 2 (two) times daily as needed. Rash      warfarin 5 MG tablet   Commonly known as: COUMADIN   Take 1 tablet (5 mg total) by mouth one time only at 6 PM. Take Coumadin for three weeks and then discontinue.  The dose may need to be adjusted based upon the INR.  Please follow the INR and titrate Coumadin dose for a therapeutic range between 2.0 and 3.0 INR.  After completing the three weeks of Coumadin, the patient may stop the Coumadin and resume their 81 mg Aspirin daily.           Follow-up Information    Follow up with Loanne Drilling, MD. Schedule an appointment as soon as possible for a visit in 2 weeks.   Contact information:   [facility] [address], [address] [place] [address] [phone]          Signed: Patrica Duel 12/16/2011, 10:38 AM

## 2011-12-17 DIAGNOSIS — Z96659 Presence of unspecified artificial knee joint: Secondary | ICD-10-CM | POA: Diagnosis not present

## 2011-12-17 DIAGNOSIS — E119 Type 2 diabetes mellitus without complications: Secondary | ICD-10-CM | POA: Diagnosis not present

## 2011-12-17 DIAGNOSIS — M48061 Spinal stenosis, lumbar region without neurogenic claudication: Secondary | ICD-10-CM | POA: Diagnosis not present

## 2011-12-17 DIAGNOSIS — I10 Essential (primary) hypertension: Secondary | ICD-10-CM | POA: Diagnosis not present

## 2011-12-17 DIAGNOSIS — Z4789 Encounter for other orthopedic aftercare: Secondary | ICD-10-CM | POA: Diagnosis not present

## 2011-12-18 DIAGNOSIS — E119 Type 2 diabetes mellitus without complications: Secondary | ICD-10-CM | POA: Diagnosis not present

## 2011-12-18 DIAGNOSIS — M48061 Spinal stenosis, lumbar region without neurogenic claudication: Secondary | ICD-10-CM | POA: Diagnosis not present

## 2011-12-18 DIAGNOSIS — Z4789 Encounter for other orthopedic aftercare: Secondary | ICD-10-CM | POA: Diagnosis not present

## 2011-12-18 DIAGNOSIS — Z96659 Presence of unspecified artificial knee joint: Secondary | ICD-10-CM | POA: Diagnosis not present

## 2011-12-18 DIAGNOSIS — I10 Essential (primary) hypertension: Secondary | ICD-10-CM | POA: Diagnosis not present

## 2011-12-19 DIAGNOSIS — Z4789 Encounter for other orthopedic aftercare: Secondary | ICD-10-CM | POA: Diagnosis not present

## 2011-12-19 DIAGNOSIS — I10 Essential (primary) hypertension: Secondary | ICD-10-CM | POA: Diagnosis not present

## 2011-12-19 DIAGNOSIS — E119 Type 2 diabetes mellitus without complications: Secondary | ICD-10-CM | POA: Diagnosis not present

## 2011-12-19 DIAGNOSIS — M48061 Spinal stenosis, lumbar region without neurogenic claudication: Secondary | ICD-10-CM | POA: Diagnosis not present

## 2011-12-19 DIAGNOSIS — Z96659 Presence of unspecified artificial knee joint: Secondary | ICD-10-CM | POA: Diagnosis not present

## 2011-12-20 DIAGNOSIS — E119 Type 2 diabetes mellitus without complications: Secondary | ICD-10-CM | POA: Diagnosis not present

## 2011-12-20 DIAGNOSIS — M48061 Spinal stenosis, lumbar region without neurogenic claudication: Secondary | ICD-10-CM | POA: Diagnosis not present

## 2011-12-20 DIAGNOSIS — Z4789 Encounter for other orthopedic aftercare: Secondary | ICD-10-CM | POA: Diagnosis not present

## 2011-12-20 DIAGNOSIS — Z96659 Presence of unspecified artificial knee joint: Secondary | ICD-10-CM | POA: Diagnosis not present

## 2011-12-20 DIAGNOSIS — I10 Essential (primary) hypertension: Secondary | ICD-10-CM | POA: Diagnosis not present

## 2011-12-23 DIAGNOSIS — E119 Type 2 diabetes mellitus without complications: Secondary | ICD-10-CM | POA: Diagnosis not present

## 2011-12-23 DIAGNOSIS — Z96659 Presence of unspecified artificial knee joint: Secondary | ICD-10-CM | POA: Diagnosis not present

## 2011-12-23 DIAGNOSIS — M48061 Spinal stenosis, lumbar region without neurogenic claudication: Secondary | ICD-10-CM | POA: Diagnosis not present

## 2011-12-23 DIAGNOSIS — Z4789 Encounter for other orthopedic aftercare: Secondary | ICD-10-CM | POA: Diagnosis not present

## 2011-12-23 DIAGNOSIS — I10 Essential (primary) hypertension: Secondary | ICD-10-CM | POA: Diagnosis not present

## 2011-12-26 DIAGNOSIS — M48061 Spinal stenosis, lumbar region without neurogenic claudication: Secondary | ICD-10-CM | POA: Diagnosis not present

## 2011-12-26 DIAGNOSIS — I10 Essential (primary) hypertension: Secondary | ICD-10-CM | POA: Diagnosis not present

## 2011-12-26 DIAGNOSIS — E119 Type 2 diabetes mellitus without complications: Secondary | ICD-10-CM | POA: Diagnosis not present

## 2011-12-26 DIAGNOSIS — Z4789 Encounter for other orthopedic aftercare: Secondary | ICD-10-CM | POA: Diagnosis not present

## 2011-12-26 DIAGNOSIS — Z96659 Presence of unspecified artificial knee joint: Secondary | ICD-10-CM | POA: Diagnosis not present

## 2011-12-27 DIAGNOSIS — Z4789 Encounter for other orthopedic aftercare: Secondary | ICD-10-CM | POA: Diagnosis not present

## 2011-12-27 DIAGNOSIS — M48061 Spinal stenosis, lumbar region without neurogenic claudication: Secondary | ICD-10-CM | POA: Diagnosis not present

## 2011-12-27 DIAGNOSIS — I10 Essential (primary) hypertension: Secondary | ICD-10-CM | POA: Diagnosis not present

## 2011-12-27 DIAGNOSIS — E119 Type 2 diabetes mellitus without complications: Secondary | ICD-10-CM | POA: Diagnosis not present

## 2011-12-27 DIAGNOSIS — Z96659 Presence of unspecified artificial knee joint: Secondary | ICD-10-CM | POA: Diagnosis not present

## 2012-01-07 DIAGNOSIS — R269 Unspecified abnormalities of gait and mobility: Secondary | ICD-10-CM | POA: Diagnosis not present

## 2012-01-07 DIAGNOSIS — Z96659 Presence of unspecified artificial knee joint: Secondary | ICD-10-CM | POA: Diagnosis not present

## 2012-01-10 DIAGNOSIS — Z96659 Presence of unspecified artificial knee joint: Secondary | ICD-10-CM | POA: Diagnosis not present

## 2012-01-10 DIAGNOSIS — R269 Unspecified abnormalities of gait and mobility: Secondary | ICD-10-CM | POA: Diagnosis not present

## 2012-01-13 DIAGNOSIS — T8450XA Infection and inflammatory reaction due to unspecified internal joint prosthesis, initial encounter: Secondary | ICD-10-CM | POA: Diagnosis not present

## 2012-01-15 ENCOUNTER — Ambulatory Visit: Payer: Medicare Other | Admitting: Infectious Diseases

## 2012-01-23 DIAGNOSIS — R079 Chest pain, unspecified: Secondary | ICD-10-CM | POA: Diagnosis not present

## 2012-01-23 DIAGNOSIS — Z96659 Presence of unspecified artificial knee joint: Secondary | ICD-10-CM | POA: Diagnosis not present

## 2012-01-23 DIAGNOSIS — R222 Localized swelling, mass and lump, trunk: Secondary | ICD-10-CM | POA: Diagnosis not present

## 2012-01-23 DIAGNOSIS — R269 Unspecified abnormalities of gait and mobility: Secondary | ICD-10-CM | POA: Diagnosis not present

## 2012-01-23 DIAGNOSIS — R072 Precordial pain: Secondary | ICD-10-CM | POA: Diagnosis not present

## 2012-01-24 DIAGNOSIS — R269 Unspecified abnormalities of gait and mobility: Secondary | ICD-10-CM | POA: Diagnosis not present

## 2012-01-24 DIAGNOSIS — Z96659 Presence of unspecified artificial knee joint: Secondary | ICD-10-CM | POA: Diagnosis not present

## 2012-01-27 DIAGNOSIS — R269 Unspecified abnormalities of gait and mobility: Secondary | ICD-10-CM | POA: Diagnosis not present

## 2012-01-27 DIAGNOSIS — Z96659 Presence of unspecified artificial knee joint: Secondary | ICD-10-CM | POA: Diagnosis not present

## 2012-01-29 DIAGNOSIS — Z96659 Presence of unspecified artificial knee joint: Secondary | ICD-10-CM | POA: Diagnosis not present

## 2012-01-29 DIAGNOSIS — R269 Unspecified abnormalities of gait and mobility: Secondary | ICD-10-CM | POA: Diagnosis not present

## 2012-01-31 DIAGNOSIS — Z96659 Presence of unspecified artificial knee joint: Secondary | ICD-10-CM | POA: Diagnosis not present

## 2012-01-31 DIAGNOSIS — R269 Unspecified abnormalities of gait and mobility: Secondary | ICD-10-CM | POA: Diagnosis not present

## 2012-02-04 DIAGNOSIS — R269 Unspecified abnormalities of gait and mobility: Secondary | ICD-10-CM | POA: Diagnosis not present

## 2012-02-04 DIAGNOSIS — Z96659 Presence of unspecified artificial knee joint: Secondary | ICD-10-CM | POA: Diagnosis not present

## 2012-02-07 DIAGNOSIS — R269 Unspecified abnormalities of gait and mobility: Secondary | ICD-10-CM | POA: Diagnosis not present

## 2012-02-07 DIAGNOSIS — Z96659 Presence of unspecified artificial knee joint: Secondary | ICD-10-CM | POA: Diagnosis not present

## 2012-02-11 DIAGNOSIS — R269 Unspecified abnormalities of gait and mobility: Secondary | ICD-10-CM | POA: Diagnosis not present

## 2012-02-11 DIAGNOSIS — Z96659 Presence of unspecified artificial knee joint: Secondary | ICD-10-CM | POA: Diagnosis not present

## 2012-02-13 DIAGNOSIS — R269 Unspecified abnormalities of gait and mobility: Secondary | ICD-10-CM | POA: Diagnosis not present

## 2012-02-13 DIAGNOSIS — Z96659 Presence of unspecified artificial knee joint: Secondary | ICD-10-CM | POA: Diagnosis not present

## 2012-02-14 DIAGNOSIS — R269 Unspecified abnormalities of gait and mobility: Secondary | ICD-10-CM | POA: Diagnosis not present

## 2012-02-14 DIAGNOSIS — Z96659 Presence of unspecified artificial knee joint: Secondary | ICD-10-CM | POA: Diagnosis not present

## 2012-02-18 DIAGNOSIS — R269 Unspecified abnormalities of gait and mobility: Secondary | ICD-10-CM | POA: Diagnosis not present

## 2012-02-18 DIAGNOSIS — Z96659 Presence of unspecified artificial knee joint: Secondary | ICD-10-CM | POA: Diagnosis not present

## 2012-02-19 DIAGNOSIS — Z96659 Presence of unspecified artificial knee joint: Secondary | ICD-10-CM | POA: Diagnosis not present

## 2012-02-19 DIAGNOSIS — R269 Unspecified abnormalities of gait and mobility: Secondary | ICD-10-CM | POA: Diagnosis not present

## 2012-02-20 DIAGNOSIS — R269 Unspecified abnormalities of gait and mobility: Secondary | ICD-10-CM | POA: Diagnosis not present

## 2012-02-20 DIAGNOSIS — Z96659 Presence of unspecified artificial knee joint: Secondary | ICD-10-CM | POA: Diagnosis not present

## 2012-02-21 DIAGNOSIS — M48061 Spinal stenosis, lumbar region without neurogenic claudication: Secondary | ICD-10-CM | POA: Diagnosis not present

## 2012-02-24 DIAGNOSIS — R269 Unspecified abnormalities of gait and mobility: Secondary | ICD-10-CM | POA: Diagnosis not present

## 2012-02-24 DIAGNOSIS — Z96659 Presence of unspecified artificial knee joint: Secondary | ICD-10-CM | POA: Diagnosis not present

## 2012-02-28 DIAGNOSIS — M48061 Spinal stenosis, lumbar region without neurogenic claudication: Secondary | ICD-10-CM | POA: Diagnosis not present

## 2012-03-01 DIAGNOSIS — Z23 Encounter for immunization: Secondary | ICD-10-CM | POA: Diagnosis not present

## 2012-03-10 DIAGNOSIS — R599 Enlarged lymph nodes, unspecified: Secondary | ICD-10-CM | POA: Diagnosis not present

## 2012-03-11 DIAGNOSIS — M2560 Stiffness of unspecified joint, not elsewhere classified: Secondary | ICD-10-CM | POA: Diagnosis not present

## 2012-03-11 DIAGNOSIS — D649 Anemia, unspecified: Secondary | ICD-10-CM | POA: Diagnosis not present

## 2012-03-16 DIAGNOSIS — I10 Essential (primary) hypertension: Secondary | ICD-10-CM | POA: Diagnosis not present

## 2012-03-16 DIAGNOSIS — L989 Disorder of the skin and subcutaneous tissue, unspecified: Secondary | ICD-10-CM | POA: Diagnosis not present

## 2012-03-16 DIAGNOSIS — L089 Local infection of the skin and subcutaneous tissue, unspecified: Secondary | ICD-10-CM | POA: Diagnosis not present

## 2012-03-16 DIAGNOSIS — D485 Neoplasm of uncertain behavior of skin: Secondary | ICD-10-CM | POA: Diagnosis not present

## 2012-03-23 ENCOUNTER — Ambulatory Visit (INDEPENDENT_AMBULATORY_CARE_PROVIDER_SITE_OTHER): Payer: Medicare Other | Admitting: Surgery

## 2012-03-23 DIAGNOSIS — D3130 Benign neoplasm of unspecified choroid: Secondary | ICD-10-CM | POA: Diagnosis not present

## 2012-03-23 DIAGNOSIS — H43399 Other vitreous opacities, unspecified eye: Secondary | ICD-10-CM | POA: Diagnosis not present

## 2012-03-23 DIAGNOSIS — H43819 Vitreous degeneration, unspecified eye: Secondary | ICD-10-CM | POA: Diagnosis not present

## 2012-03-23 DIAGNOSIS — Z961 Presence of intraocular lens: Secondary | ICD-10-CM | POA: Diagnosis not present

## 2012-03-26 DIAGNOSIS — L089 Local infection of the skin and subcutaneous tissue, unspecified: Secondary | ICD-10-CM | POA: Diagnosis not present

## 2012-04-06 DIAGNOSIS — I10 Essential (primary) hypertension: Secondary | ICD-10-CM | POA: Diagnosis not present

## 2012-04-06 DIAGNOSIS — Z01419 Encounter for gynecological examination (general) (routine) without abnormal findings: Secondary | ICD-10-CM | POA: Diagnosis not present

## 2012-04-08 DIAGNOSIS — N281 Cyst of kidney, acquired: Secondary | ICD-10-CM | POA: Diagnosis not present

## 2012-04-10 DIAGNOSIS — Z1231 Encounter for screening mammogram for malignant neoplasm of breast: Secondary | ICD-10-CM | POA: Diagnosis not present

## 2012-04-10 DIAGNOSIS — Z978 Presence of other specified devices: Secondary | ICD-10-CM | POA: Diagnosis not present

## 2012-05-26 DIAGNOSIS — T84099A Other mechanical complication of unspecified internal joint prosthesis, initial encounter: Secondary | ICD-10-CM | POA: Diagnosis not present

## 2012-08-13 DIAGNOSIS — J449 Chronic obstructive pulmonary disease, unspecified: Secondary | ICD-10-CM | POA: Diagnosis not present

## 2012-09-10 DIAGNOSIS — Z800 Family history of malignant neoplasm of digestive organs: Secondary | ICD-10-CM | POA: Diagnosis not present

## 2012-09-10 DIAGNOSIS — Z87891 Personal history of nicotine dependence: Secondary | ICD-10-CM | POA: Diagnosis not present

## 2012-09-10 DIAGNOSIS — R29818 Other symptoms and signs involving the nervous system: Secondary | ICD-10-CM | POA: Diagnosis not present

## 2012-09-10 DIAGNOSIS — Z79899 Other long term (current) drug therapy: Secondary | ICD-10-CM | POA: Diagnosis not present

## 2012-09-10 DIAGNOSIS — R0602 Shortness of breath: Secondary | ICD-10-CM | POA: Diagnosis not present

## 2012-09-10 DIAGNOSIS — E785 Hyperlipidemia, unspecified: Secondary | ICD-10-CM | POA: Diagnosis not present

## 2012-09-10 DIAGNOSIS — Z780 Asymptomatic menopausal state: Secondary | ICD-10-CM | POA: Diagnosis not present

## 2012-09-10 DIAGNOSIS — M4800 Spinal stenosis, site unspecified: Secondary | ICD-10-CM | POA: Diagnosis not present

## 2012-09-10 DIAGNOSIS — R5381 Other malaise: Secondary | ICD-10-CM | POA: Diagnosis not present

## 2012-09-10 DIAGNOSIS — I10 Essential (primary) hypertension: Secondary | ICD-10-CM | POA: Diagnosis not present

## 2012-09-10 DIAGNOSIS — Z23 Encounter for immunization: Secondary | ICD-10-CM | POA: Diagnosis not present

## 2012-09-10 DIAGNOSIS — Z7982 Long term (current) use of aspirin: Secondary | ICD-10-CM | POA: Diagnosis not present

## 2012-09-10 DIAGNOSIS — G459 Transient cerebral ischemic attack, unspecified: Secondary | ICD-10-CM | POA: Diagnosis not present

## 2012-09-10 DIAGNOSIS — Z801 Family history of malignant neoplasm of trachea, bronchus and lung: Secondary | ICD-10-CM | POA: Diagnosis not present

## 2012-09-10 DIAGNOSIS — R5383 Other fatigue: Secondary | ICD-10-CM | POA: Diagnosis not present

## 2012-09-11 DIAGNOSIS — G459 Transient cerebral ischemic attack, unspecified: Secondary | ICD-10-CM | POA: Diagnosis not present

## 2012-09-11 DIAGNOSIS — E785 Hyperlipidemia, unspecified: Secondary | ICD-10-CM | POA: Diagnosis not present

## 2012-09-11 DIAGNOSIS — I6529 Occlusion and stenosis of unspecified carotid artery: Secondary | ICD-10-CM | POA: Diagnosis not present

## 2012-09-11 DIAGNOSIS — M4800 Spinal stenosis, site unspecified: Secondary | ICD-10-CM | POA: Diagnosis not present

## 2012-09-11 DIAGNOSIS — Z780 Asymptomatic menopausal state: Secondary | ICD-10-CM | POA: Diagnosis not present

## 2012-09-11 DIAGNOSIS — I10 Essential (primary) hypertension: Secondary | ICD-10-CM | POA: Diagnosis not present

## 2012-09-11 DIAGNOSIS — R5381 Other malaise: Secondary | ICD-10-CM | POA: Diagnosis not present

## 2012-09-11 DIAGNOSIS — Z23 Encounter for immunization: Secondary | ICD-10-CM | POA: Diagnosis not present

## 2012-09-16 DIAGNOSIS — Z1329 Encounter for screening for other suspected endocrine disorder: Secondary | ICD-10-CM | POA: Diagnosis not present

## 2012-09-17 DIAGNOSIS — E042 Nontoxic multinodular goiter: Secondary | ICD-10-CM | POA: Diagnosis not present

## 2012-09-22 IMAGING — CR DG CHEST 2V
2 series · 2 of 2 positions shown · non-contrast
Comparison: 10/30/2010

CLINICAL DATA: Postop right total knee arthroplasty

CHEST - 2 VIEW

[w chest lat]
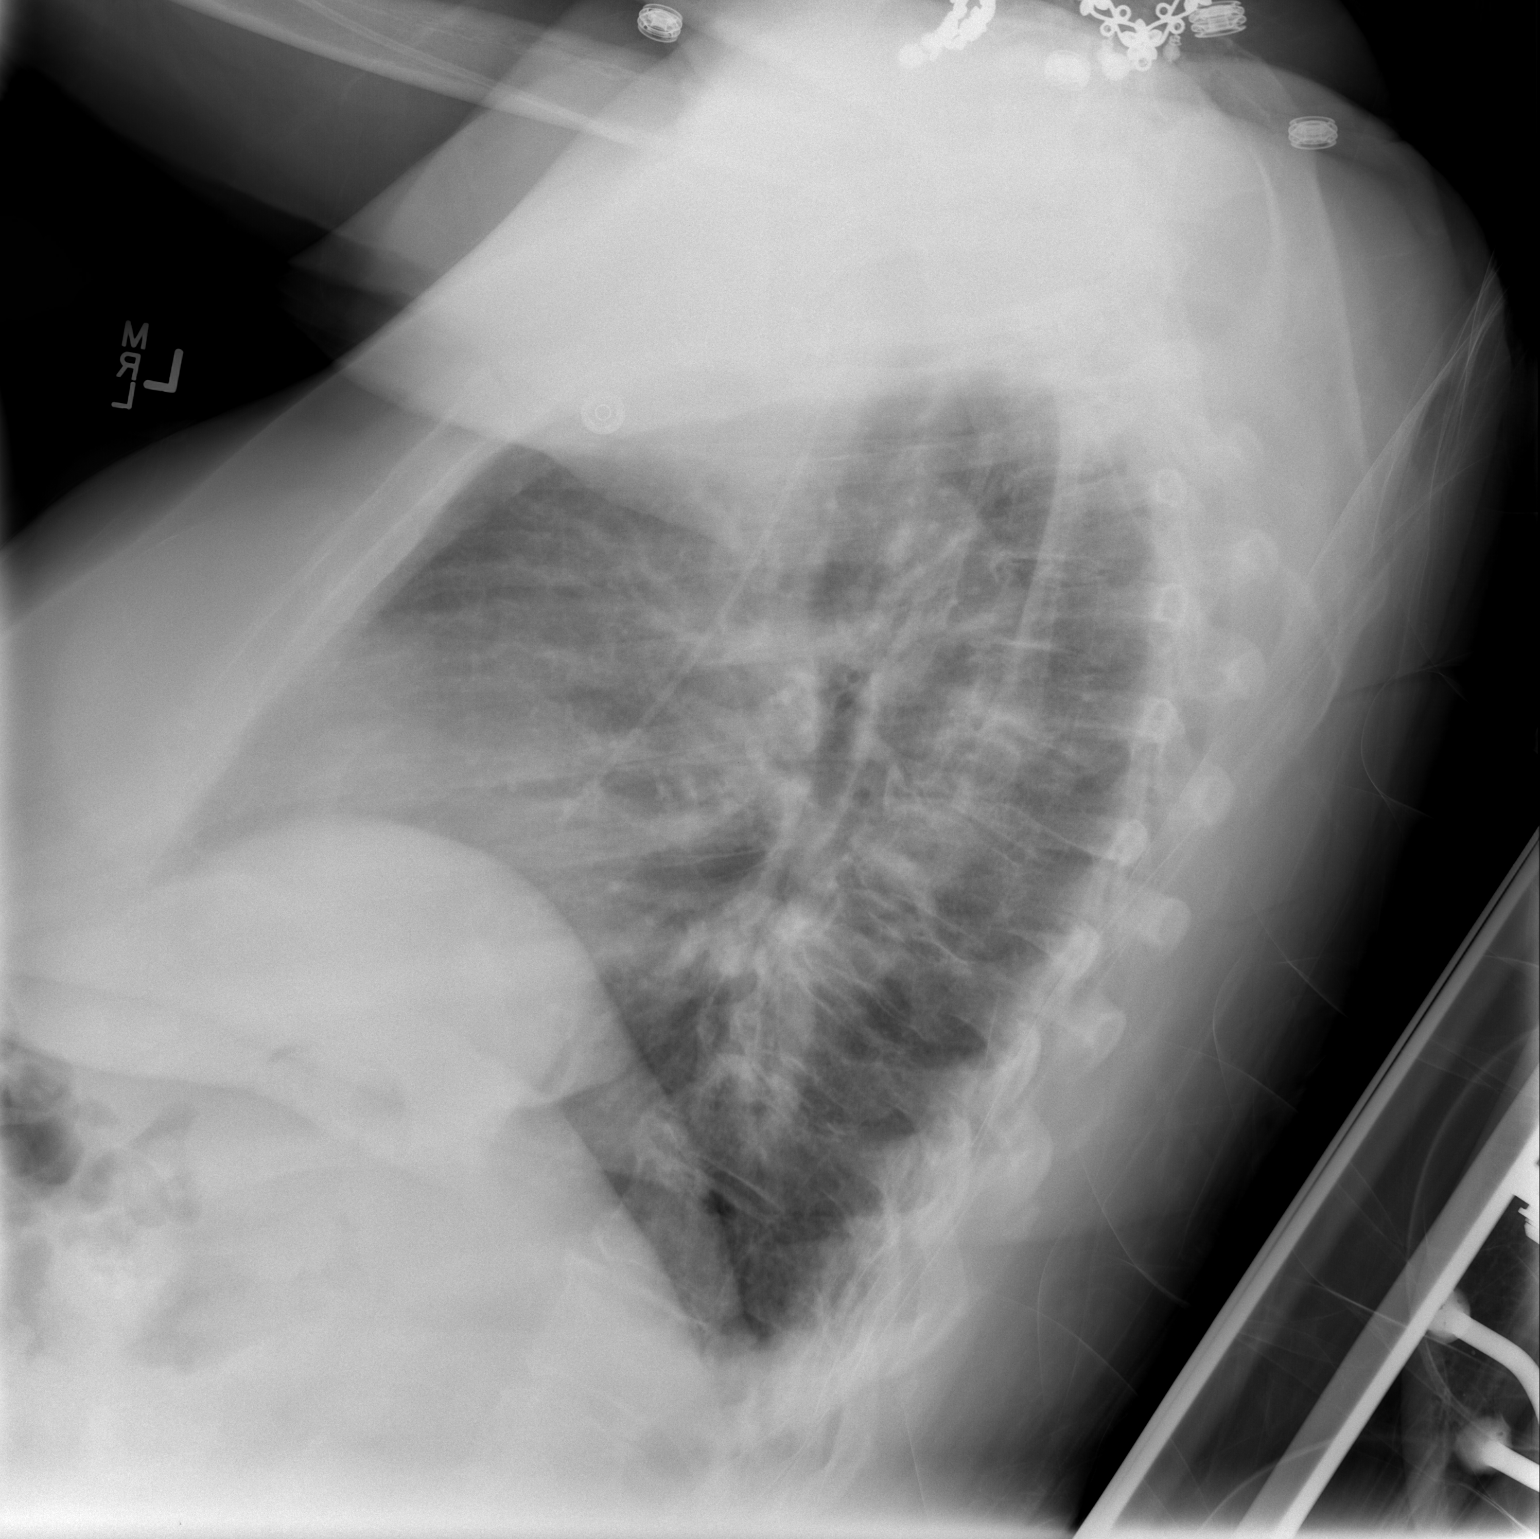

[view not recorded]
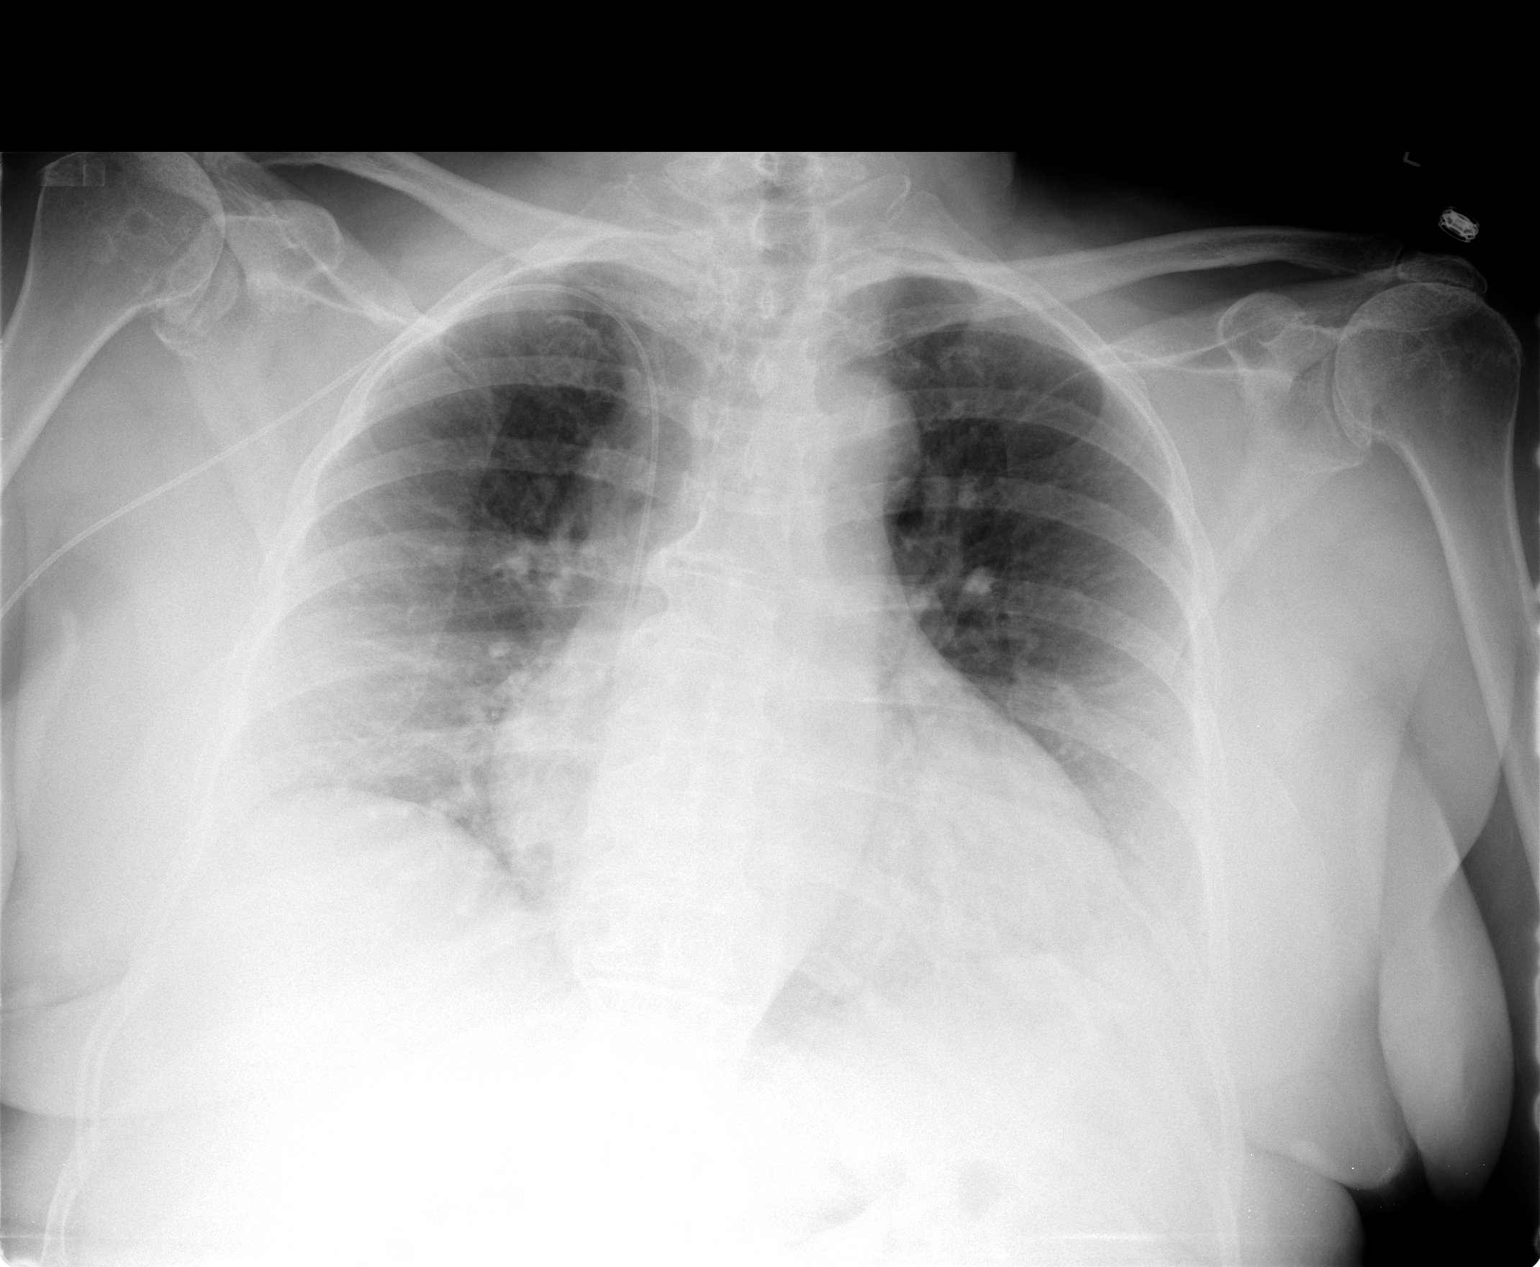

[2 of 2 positions shown; findings below may reference images not displayed]

FINDINGS: There is a right arm PICC line with tip in the SVC.

The heart size appears mildly enlarged.

There is no pleural effusion or pulmonary edema identified.

There is no airspace consolidation identified.

The lung volumes are low and there are coarsened interstitial
markings bilaterally.
IMPRESSION: 1.  No acute cardiopulmonary abnormalities.
2.  Low lung volumes. No evidence for pneumonia or heart failure.

## 2012-09-29 IMAGING — CR DG SPINE 1V PORT
1 series · 1 of 1 positions shown · non-contrast
Comparison: None.

CLINICAL DATA: Back pain

DG SPINE PORTABLE - 1 VIEW

[lateral]
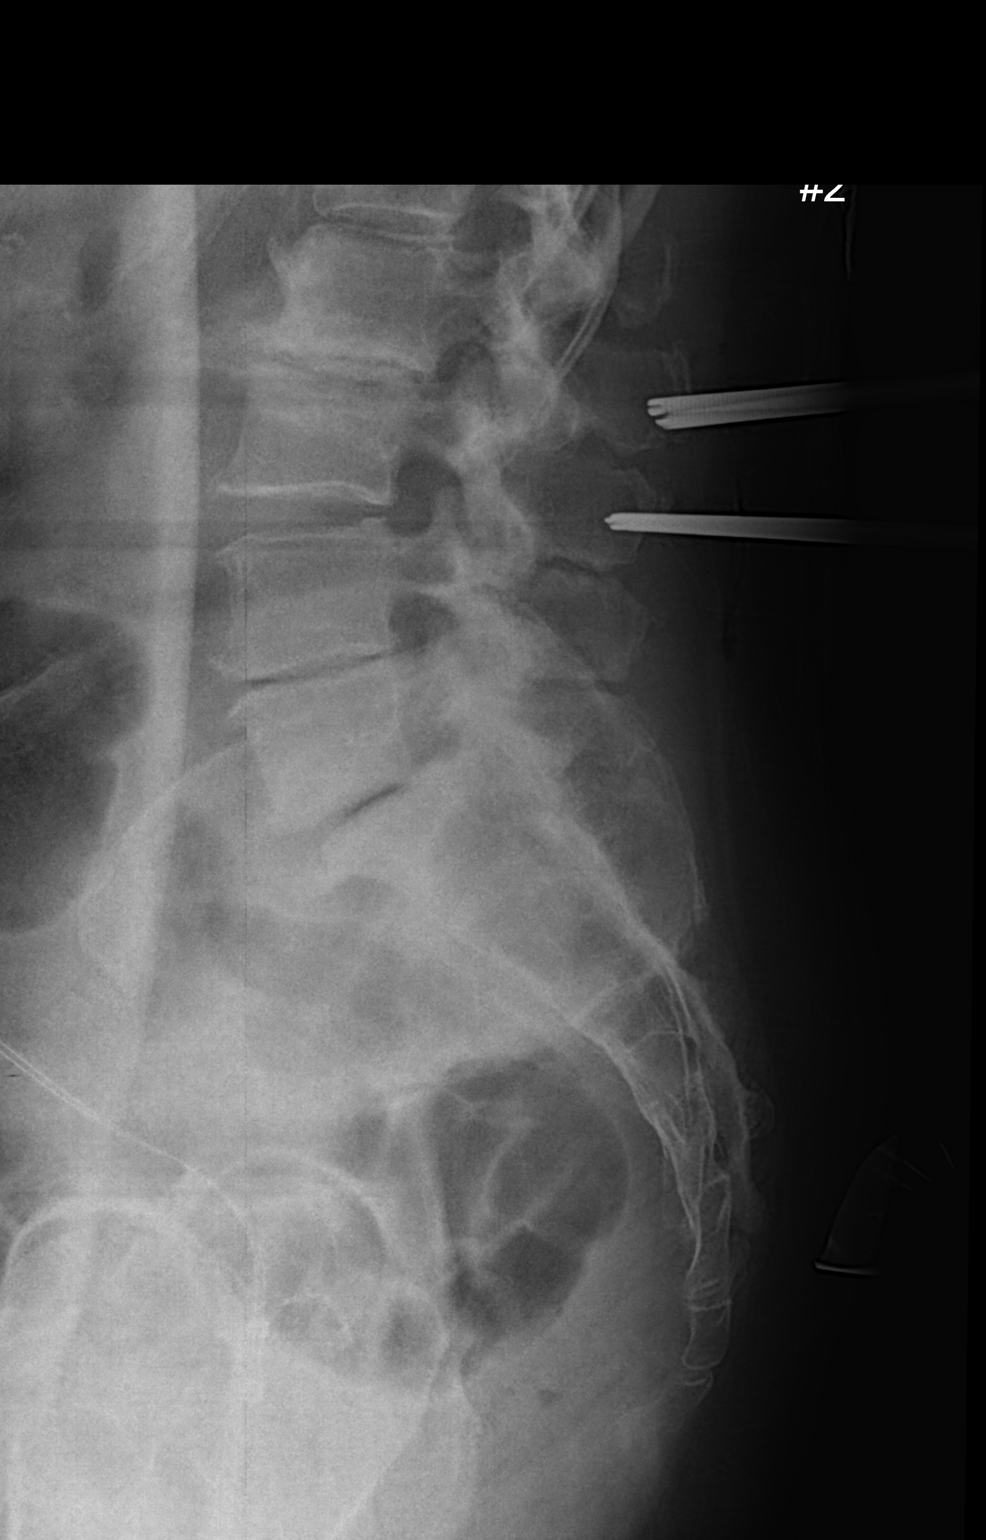

[1 of 1 positions shown; findings below may reference images not displayed]

FINDINGS: Intraoperative portable film #2 demonstrates clamps on
the spinous processes of L2 and L3.
IMPRESSION: As above.

## 2012-10-06 DIAGNOSIS — I10 Essential (primary) hypertension: Secondary | ICD-10-CM | POA: Diagnosis not present

## 2012-10-07 DIAGNOSIS — M1710 Unilateral primary osteoarthritis, unspecified knee: Secondary | ICD-10-CM | POA: Diagnosis not present

## 2012-10-12 DIAGNOSIS — M545 Low back pain: Secondary | ICD-10-CM | POA: Diagnosis not present

## 2012-11-03 DIAGNOSIS — Z96659 Presence of unspecified artificial knee joint: Secondary | ICD-10-CM | POA: Diagnosis not present

## 2012-11-04 ENCOUNTER — Other Ambulatory Visit: Payer: Self-pay | Admitting: *Deleted

## 2012-11-04 ENCOUNTER — Encounter: Payer: Self-pay | Admitting: Diagnostic Neuroimaging

## 2012-11-04 ENCOUNTER — Ambulatory Visit (INDEPENDENT_AMBULATORY_CARE_PROVIDER_SITE_OTHER): Payer: Medicare Other | Admitting: Diagnostic Neuroimaging

## 2012-11-04 VITALS — BP 156/78 | HR 68 | Temp 97.9°F | Ht 65.5 in | Wt 202.5 lb

## 2012-11-04 DIAGNOSIS — G459 Transient cerebral ischemic attack, unspecified: Secondary | ICD-10-CM

## 2012-11-04 NOTE — Progress Notes (Signed)
[facility]  PATIENT: [name] DOB: 1941/03/22  REFERRING CLINICIAN: Ranee Gosselin, MD HISTORY FROM: patient, records REASON FOR VISIT: consult   HISTORICAL  CHIEF COMPLAINT:  Chief Complaint  Patient presents with  . Numbness    left side    HISTORY OF PRESENT ILLNESS:  [name] is a 72 year old Caucasian female who has c/o numbness on the left side of her mouth, her left arm, and down her left leg.  Comes on and lasts for about a minute, and goes away.  Started several months ago, and has happened intermittently since then, sometimes once a week.  She has brought it to the attention of her PCP and they sent her for carotid dopplers and brain MRI.  She states her PCP put her on Plavix 75mg  daily and was put on Aspirin 81 mg daily at time of hospital discharge.  REVIEW OF SYSTEMS: Full 14 system review of systems performed and notable only for swelling in legs hearing loss shortness of breath easy bruising easy bleeding joint pain joint swelling cramps aching muscles considered to be numbness weakness restless legs.  ALLERGIES: No Known Allergies  HOME MEDICATIONS: Outpatient Encounter Prescriptions as of 11/04/2012  Medication Sig Dispense Refill  . aspirin 81 MG tablet Take 81 mg by mouth daily.      . clopidogrel (PLAVIX) 75 MG tablet       . Ferrous Sulfate (IRON) 325 (65 FE) MG TABS Take 65 mg by mouth 2 (two) times a week.      . fish oil-omega-3 fatty acids 1000 MG capsule Take 2 g by mouth daily.      . Garlic (GARLIQUE) 400 MG TBEC Take 1 tablet by mouth daily.      Marland Kitchen labetalol (NORMODYNE) 300 MG tablet Take 300 mg by mouth 2 (two) times daily.      . methocarbamol (ROBAXIN) 500 MG tablet Take 500 mg by mouth 3 (three) times daily.      . naproxen sodium (ANAPROX) 220 MG tablet Take 220 mg by mouth 2 (two) times daily with a meal.      . oxyCODONE (OXY IR/ROXICODONE) 5 MG immediate release tablet 2 mg daily.       .  quinapril-hydrochlorothiazide (ACCURETIC) 20-12.5 MG per tablet Take 1 tablet by mouth every morning.       . Red Yeast Rice Extract (RED YEAST RICE PO) Take by mouth daily.      Marland Kitchen Specialty Vitamins Products (MENOPAUSE RELIEF PO) Take 1 tablet by mouth daily.      Marland Kitchen triamcinolone cream (KENALOG) 0.1 % Apply 1 application topically 2 (two) times daily as needed. Rash      . vitamin C (ASCORBIC ACID) 500 MG tablet Take 500 mg by mouth daily.      . [DISCONTINUED] enoxaparin (LOVENOX) 30 MG/0.3ML injection Inject 0.3 mLs (30 mg total) into the skin every 12 (twelve) hours. Continue Lovenox injections until the INR is therapeutic at or greater than 2.0.  When INR reaches the therapeutic level of equal to or greater than 2.0, the patient may discontinue the Lovenox injections.  6 Syringe  0  . [DISCONTINUED] labetalol (NORMODYNE) 300 MG tablet Take 300 mg by mouth 2 (two) times daily.       . [DISCONTINUED] potassium chloride SA (K-DUR,KLOR-CON) 20 MEQ tablet Take 20 mEq by mouth daily.      . [DISCONTINUED] sulfamethoxazole-trimethoprim (BACTRIM,SEPTRA) 400-80 MG per tablet       . [  DISCONTINUED] warfarin (COUMADIN) 5 MG tablet Take 1 tablet (5 mg total) by mouth one time only at 6 PM. Take Coumadin for three weeks and then discontinue.  The dose may need to be adjusted based upon the INR.  Please follow the INR and titrate Coumadin dose for a therapeutic range between 2.0 and 3.0 INR.  After completing the three weeks of Coumadin, the patient may stop the Coumadin and resume their 81 mg Aspirin daily.  45 tablet  0   No facility-administered encounter medications on file as of 11/04/2012.       PAST MEDICAL HISTORY: Past Medical History  Diagnosis Date  . DJD (degenerative joint disease)     generalized  . Dyslipidemia   . Diabetes mellitus     borderline  . Squamous cell carcinoma   . Osteoarthritis   . HTN (hypertension)   . Spinal stenosis of lumbar region 04/17/2011  . Dyspnea 09-27-11     with extreme exertion.  Marland Kitchen COPD (chronic obstructive pulmonary disease) 09-27-11    mild  . Rotator cuff syndrome 09-27-11    rotator cuff pain sometimes  . Carpal tunnel syndrome, right 09-27-11    right hand  . 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 Gallstones 09-27-11    hx. not bothered  . SOB (shortness of breath)   . Muscle ache     PAST SURGICAL HISTORY: Past Surgical History  Procedure Laterality Date  . Cataract extraction  2011    bilateral  . Lumbar laminectomy/decompression microdiscectomy  04/19/2011    Procedure: LUMBAR LAMINECTOMY/DECOMPRESSION MICRODISCECTOMY;  Surgeon: Jacki Cones;  Location: [facility];  Service: Orthopedics;  Laterality: N/A;  Central decompression Lumbar two to lumbar three and Three to Lumbar Four    (xray)  . Tubal ligation    . Joint replacement  09-27-11    '04/ right with resection for infection, now antibiotic spacer planned  . Joint replacement      not replaced- only surgery 2007/2011/2012.  . Back surgery    . Total knee revision  12/04/2011    Procedure: TOTAL KNEE REVISION;  Surgeon: Loanne Drilling, MD;  Location: [facility];  Service: Orthopedics;  Laterality: Right;  Reimplantation of a Right Total Knee Arthroplasty    FAMILY HISTORY: Family History  Problem Relation Age of Onset  . Heart attack Mother   . Cancer Mother     cholangiocarcinoma  . Heart attack Brother   . Heart attack Brother   . Heart attack Brother   . Coronary artery disease Other   . Heart disease Father   . Diabetes      SOCIAL HISTORY:  History   Social History  . Marital Status: Married    Spouse Name: [name]    Number of Children: 3  . Years of Education: 12   Occupational History  . Retired     [employer]   Social History Main Topics  . Smoking status: Never Smoker   . Smokeless tobacco: Never Used  . Alcohol Use: No  . Drug Use: No  . Sexually Active: Yes   Other Topics Concern  . Not on file   Social History Narrative    Lives with husband. Retired, worked in a [employer]. Does not regularly exercise.    Caffeine Use: 2 12oz sodas daily     PHYSICAL EXAM  Filed Vitals:   11/04/12 1122  BP: 156/78  Pulse: 68  Temp: 97.9 F (36.6 C)  TempSrc: Oral  Height: 5' 5.5" (1.664 m)  Weight: 202 lb 8 oz (91.853 kg)    Not recorded    Body mass index is 33.17 kg/(m^2).  GENERAL EXAM: Patient is in no distress   CARDIOVASCULAR: Regular rate and rhythm, no murmurs, no carotid bruits  NEUROLOGIC: MENTAL STATUS: awake, alert, language fluent, comprehension intact, naming intact CRANIAL NERVE: no papilledema on fundoscopic exam, pupils equal and reactive to light, visual fields full to confrontation, extraocular muscles intact, no nystagmus, facial sensation and strength symmetric, uvula midline, shoulder shrug symmetric, tongue midline. MOTOR: normal bulk and tone, full strength in the BUE, BLE SENSORY: normal and symmetric to light touch, pinprick, temperature, vibration COORDINATION: finger-nose-finger, fine finger movements normal REFLEXES: deep tendon reflexes present and symmetric, 1+ in BUE and TRACE IN KNEES AND ANKLES GAIT/STATION: narrow based gait; able to walk on toes, heels and tandem; romberg is negative   DIAGNOSTIC DATA (LABS, IMAGING, TESTING) - I reviewed patient records, labs, notes, testing and imaging myself where available.  Lab Results  Component Value Date   WBC 3.6* 12/07/2011   HGB 8.9* 12/07/2011   HCT 27.7* 12/07/2011   MCV 86.0 12/07/2011   PLT 153 12/07/2011      Component Value Date/Time   NA 136 12/07/2011 0405   K 3.9 12/07/2011 0405   CL 103 12/07/2011 0405   CO2 27 12/07/2011 0405   GLUCOSE 113* 12/07/2011 0405   BUN 7 12/07/2011 0405   CREATININE 0.61 12/07/2011 0405   CALCIUM 8.8 12/07/2011 0405   PROT 7.4 11/26/2011 1205   ALBUMIN 3.9 11/26/2011 1205   AST 18 11/26/2011 1205   ALT <5 11/26/2011 1205   ALKPHOS 107 11/26/2011 1205   BILITOT 0.1* 11/26/2011 1205    GFRNONAA >90 12/07/2011 0405   GFRAA >90 12/07/2011 0405     ASSESSMENT AND PLAN  72 y.o. year old female  has a past medical history of DJD (degenerative joint disease); Dyslipidemia; Diabetes mellitus; Squamous cell carcinoma; Osteoarthritis; HTN (hypertension); Spinal stenosis of lumbar region (04/17/2011); Dyspnea (09-27-11); COPD (chronic obstructive pulmonary disease) (09-27-11); Rotator cuff syndrome (09-27-11); Carpal tunnel syndrome, right (09-27-11); Eczematous dermatitis (09-27-11); Gallstones (09-27-11); SOB (shortness of breath); and Muscle ache. here with intermittent left face, arm, leg numbness. Needs TIA workup. Continue aspirin and plavix.   Orders Placed This Encounter  Procedures  . MR Brain Wo Contrast  . MR MRA HEAD WO CONTRAST  . Ambulatory referral to Cardiology  . 2D Echocardiogram without contrast     Suanne Marker, MD 11/04/2012, 12:00 PM Certified in Neurology, Neurophysiology and Neuroimaging  [facility] [address], [address] [place], [place] [address] [phone]

## 2012-11-04 NOTE — Patient Instructions (Signed)
I will order additional testing.  Follow up with PCP for risk factor control of cholesterol and hypertension.  Stop aspirin.   Continue plavix 75mg  daily.

## 2012-11-10 ENCOUNTER — Ambulatory Visit
Admission: RE | Admit: 2012-11-10 | Discharge: 2012-11-10 | Disposition: A | Discharge status: Home / Self Care | Payer: Medicare Other | Source: Ambulatory Visit | Attending: Diagnostic Neuroimaging | Admitting: Diagnostic Neuroimaging

## 2012-11-10 ENCOUNTER — Other Ambulatory Visit (HOSPITAL_COMMUNITY): Payer: Self-pay | Admitting: Diagnostic Neuroimaging

## 2012-11-10 DIAGNOSIS — G459 Transient cerebral ischemic attack, unspecified: Secondary | ICD-10-CM

## 2012-11-11 ENCOUNTER — Ambulatory Visit (HOSPITAL_COMMUNITY): Payer: Medicare Other | Attending: Diagnostic Neuroimaging

## 2012-11-11 ENCOUNTER — Telehealth: Payer: Self-pay | Admitting: *Deleted

## 2012-11-11 DIAGNOSIS — G459 Transient cerebral ischemic attack, unspecified: Secondary | ICD-10-CM | POA: Diagnosis not present

## 2012-11-11 NOTE — Telephone Encounter (Signed)
Called patient and let her know once her results are read, we will contact her with them she showed understanding.

## 2012-11-11 NOTE — Progress Notes (Signed)
Echocardiogram performed.  

## 2012-11-17 ENCOUNTER — Telehealth: Payer: Self-pay | Admitting: Diagnostic Neuroimaging

## 2012-11-17 DIAGNOSIS — G459 Transient cerebral ischemic attack, unspecified: Secondary | ICD-10-CM

## 2012-11-17 NOTE — Telephone Encounter (Signed)
Calling for results of 2Decho.   Please advise.

## 2012-11-17 NOTE — Telephone Encounter (Signed)
I called [name] and gave her the results of MRI brain, 2 D echo , and MRA ( she needs her cardiac event monitor).     She has f/u appt with L. Lam , NP in 02-04-13.  Will order the cardiac event monitor since referral did not go thru.

## 2012-11-20 ENCOUNTER — Telehealth: Payer: Self-pay | Admitting: Diagnostic Neuroimaging

## 2012-11-20 NOTE — Telephone Encounter (Signed)
Patient is calling to tell us her Orthopedic MD has not received the last office notes from [place].  She sees Dr. Ranee Gosselin at [facility].  Their phone number is: [phone].

## 2012-11-20 NOTE — Telephone Encounter (Signed)
To Medical Records.

## 2012-12-18 ENCOUNTER — Encounter: Payer: Self-pay | Admitting: *Deleted

## 2012-12-18 ENCOUNTER — Ambulatory Visit (INDEPENDENT_AMBULATORY_CARE_PROVIDER_SITE_OTHER): Payer: Medicare Other | Admitting: Cardiovascular Disease

## 2012-12-18 ENCOUNTER — Encounter: Payer: Self-pay | Admitting: Cardiovascular Disease

## 2012-12-18 VITALS — BP 112/60 | HR 66 | Ht 65.5 in | Wt 192.0 lb

## 2012-12-18 DIAGNOSIS — I359 Nonrheumatic aortic valve disorder, unspecified: Secondary | ICD-10-CM | POA: Diagnosis not present

## 2012-12-18 DIAGNOSIS — I10 Essential (primary) hypertension: Secondary | ICD-10-CM | POA: Diagnosis not present

## 2012-12-18 DIAGNOSIS — I517 Cardiomegaly: Secondary | ICD-10-CM

## 2012-12-18 DIAGNOSIS — E785 Hyperlipidemia, unspecified: Secondary | ICD-10-CM | POA: Diagnosis not present

## 2012-12-18 DIAGNOSIS — Z01818 Encounter for other preprocedural examination: Secondary | ICD-10-CM

## 2012-12-18 DIAGNOSIS — I350 Nonrheumatic aortic (valve) stenosis: Secondary | ICD-10-CM

## 2012-12-18 NOTE — Progress Notes (Signed)
Patient ID: [name], female   DOB: November 22, 1940, 72 y.o.   MRN: [id number]    CARDIOLOGY CONSULT NOTE  Patient ID: [name] MRN: [id number] DOB/AGE: Sep 25, 1940 72 y.o.  Admit date: (Not on file) Primary Physician:  Reason for Consultation: preop risk stratification, aortic stenosis  HPI: [name] is a 72 y.o. Woman with a PMH significant for HTN, dyslipidemia, type II diabetes mellitus, COPD, lumbar central canal stenosis, and a TIA. She recently underwent echocardiography which revealed normal LV systolic function, moderate concentric LVH, and mild aortic stenosis.  She's been experiencing numbness on the left side of her body. She feels it her arm and leg, but it starts on the left side of her mouth. She was placed on Plavix for this, as there was concern she may be having stroke-like symptoms.   She denies chest pain. She has COPD and has chronic shortness of breath due to that.    Review of systems complete and found to be negative unless listed above in HPI  Past Medical History: see HPI   Family History  Problem Relation Age of Onset  . Heart attack Mother   . Cancer Mother     cholangiocarcinoma  . Heart attack Brother   . Heart attack Brother   . Heart attack Brother   . Coronary artery disease Other   . Heart disease Father   . Diabetes      History   Social History  . Marital Status: Married    Spouse Name: Jerolyn Shin    Number of Children: 3  . Years of Education: 12   Occupational History  . Retired     [employer]   Social History Main Topics  . Smoking status: Never Smoker   . Smokeless tobacco: Never Used  . Alcohol Use: No  . Drug Use: No  . Sexually Active: Yes   Other Topics Concern  . Not on file   Social History Narrative   Lives with husband. Retired, worked in a [employer]. Does not regularly exercise.    Caffeine Use: 2 12oz sodas daily      (Not in a hospital admission)  Physical exam BP: 112/60 mmHg HR: 66  bpm  General: NAD Neck: No JVD, no thyromegaly or thyroid nodule.  Lungs: Clear to auscultation bilaterally with normal respiratory effort. CV: Nondisplaced PMI.  Heart regular S1/S2, no S3/S4, no murmur.  No peripheral edema.  No carotid bruit.  Normal pedal pulses.  Abdomen: Soft, nontender, no hepatosplenomegaly, no distention.  Skin: Intact without lesions or rashes.  Neurologic: Alert and oriented x 3.  Psych: Normal affect. Extremities: No clubbing or cyanosis.  HEENT: Normal.   Labs:   Lab Results  Component Value Date   WBC 3.6* 12/07/2011   HGB 8.9* 12/07/2011   HCT 27.7* 12/07/2011   MCV 86.0 12/07/2011   PLT 153 12/07/2011   No results found for this basename: NA, K, CL, CO2, BUN, CREATININE, CALCIUM, LABALBU, PROT, BILITOT, ALKPHOS, ALT, AST, GLUCOSE,  in the last 168 hours No results found for this basename: CKTOTAL, CKMB, CKMBINDEX, TROPONINI    No results found for this basename: CHOL   No results found for this basename: HDL   No results found for this basename: LDLCALC   No results found for this basename: TRIG   No results found for this basename: CHOLHDL   No results found for this basename: LDLDIRECT       EKG: Sinus rhythm, 1st degree  AV block, nonspecific T wave abnormality  Echo (June 2014): - Left ventricle: The cavity size was normal. Wall thickness was increased in a pattern of moderate LVH. Systolic function was normal. The estimated ejection fraction was in the range of 55% to 65%. Wall motion was normal; there were no regional wall motion abnormalities. Left ventricular diastolic function parameters were normal. - Aortic valve: There was very mild stenosis. Trivial regurgitation. - Mitral valve: Mild regurgitation. - Atrial septum: No defect or patent foramen ovale was identified. - Pulmonary arteries: PA peak pressure: 35mm Hg (S).  Carotid Doppler (09/11/2012): less than 50% stenosis on the left.  MRI: severe lumbar central canal  stenosis   ASSESSMENT AND PLAN:  1. Aortic stenosis (mild): continue to monitor by exam and echocardiography. 2. HTN: controlled on Labetolol and Accuretic. Her moderate concentric LVH would suggest this is chronic. 3. Carotid stenosis (left): continue to monitor with serial ultrasounds. 4. Pre-op risk stratification: will proceed with a Lexiscan Myoview stress test.  Signed: Prentice Docker, M.D., F.A.C.C. 12/18/2012, 1:29 PM

## 2012-12-18 NOTE — Patient Instructions (Addendum)
Your physician recommends that you schedule a follow-up appointment in: ONE YEAR  Your physician has requested that you have a lexiscan myoview. For further information please visit [email or web address] Please follow instruction sheet, as given.

## 2012-12-25 ENCOUNTER — Encounter (HOSPITAL_COMMUNITY)
Admission: RE | Admit: 2012-12-25 | Discharge: 2012-12-25 | Disposition: A | Discharge status: Home / Self Care | Payer: Medicare Other | Source: Ambulatory Visit | Attending: Cardiovascular Disease | Admitting: Cardiovascular Disease

## 2012-12-25 ENCOUNTER — Encounter (HOSPITAL_COMMUNITY): Payer: Self-pay

## 2012-12-25 DIAGNOSIS — E119 Type 2 diabetes mellitus without complications: Secondary | ICD-10-CM | POA: Insufficient documentation

## 2012-12-25 DIAGNOSIS — E785 Hyperlipidemia, unspecified: Secondary | ICD-10-CM | POA: Insufficient documentation

## 2012-12-25 DIAGNOSIS — R079 Chest pain, unspecified: Secondary | ICD-10-CM | POA: Insufficient documentation

## 2012-12-25 DIAGNOSIS — R0602 Shortness of breath: Secondary | ICD-10-CM

## 2012-12-25 DIAGNOSIS — Z01810 Encounter for preprocedural cardiovascular examination: Secondary | ICD-10-CM | POA: Insufficient documentation

## 2012-12-25 DIAGNOSIS — I10 Essential (primary) hypertension: Secondary | ICD-10-CM | POA: Diagnosis not present

## 2012-12-25 DIAGNOSIS — I517 Cardiomegaly: Secondary | ICD-10-CM

## 2012-12-25 DIAGNOSIS — Z01818 Encounter for other preprocedural examination: Secondary | ICD-10-CM

## 2012-12-25 DIAGNOSIS — I350 Nonrheumatic aortic (valve) stenosis: Secondary | ICD-10-CM

## 2012-12-25 MED ORDER — TECHNETIUM TC 99M SESTAMIBI - CARDIOLITE
30.0000 | Freq: Once | INTRAVENOUS | Status: AC | PRN
  Administered 2012-12-25: 30 via INTRAVENOUS

## 2012-12-25 MED ORDER — REGADENOSON 0.4 MG/5ML IV SOLN
INTRAVENOUS | Status: AC
  Filled 2012-12-25: qty 5

## 2012-12-25 MED ORDER — TECHNETIUM TC 99M SESTAMIBI - CARDIOLITE
10.0000 | Freq: Once | INTRAVENOUS | Status: AC | PRN
  Administered 2012-12-25: 10 via INTRAVENOUS

## 2012-12-25 MED ORDER — SODIUM CHLORIDE 0.9 % IJ SOLN
INTRAMUSCULAR | Status: AC
  Filled 2012-12-25: qty 10

## 2012-12-25 NOTE — Progress Notes (Signed)
Stress Lab Nurses Notes - [name]  [name] 12/25/2012  Reason for doing test: Chest Pain  Type of test: Steffanie Hallmon  Nurse performing test: Marlena Clipper, RN  Nuclear Medicine Tech: Lyndel Pleasure  Echo Tech: Not Applicable  MD performing test: Joni Reining NP  Family MD:   Test explained and consent signed: yes  IV started: 22g jelco  Symptoms: Funny feeling   Treatment/Intervention: None  Reason test stopped: protocol completed  After recovery IV was: Discontinued via X-ray tech  Patient to return to Nuc. Med at :11:00  Patient discharged: Home  Patient's Condition upon discharge was: stable  Comments: Patients resting BP was 153/84 and resting Hr was 64, Peak HR was 75 and Peak BP was 159/81. Symptoms resolved in recovery   Angelica Pou

## 2012-12-29 ENCOUNTER — Telehealth: Payer: Self-pay | Admitting: *Deleted

## 2012-12-29 DIAGNOSIS — R0602 Shortness of breath: Secondary | ICD-10-CM

## 2012-12-29 NOTE — Telephone Encounter (Signed)
Calling for stress test results, informed pt that this has not been read by doctor yet and we would call her once it had.

## 2013-01-01 NOTE — Telephone Encounter (Signed)
Please inform [name] that her stress test was normal.

## 2013-01-01 NOTE — Telephone Encounter (Signed)
.  left message to have patient return my call.  

## 2013-01-01 NOTE — Telephone Encounter (Signed)
Please advise 

## 2013-01-04 ENCOUNTER — Ambulatory Visit: Payer: Medicare Other | Admitting: Cardiovascular Disease

## 2013-01-04 NOTE — Telephone Encounter (Signed)
Spoke to pt to advise results/instructions. Pt understood. Pt notes she has a mild case of COPD per [facility] and she has to run her fan at night to keep air flowing while she sleeps per notes SOB at night sometimes, pt notes she does not have a pulmonologist and wants to know what Dr. Purvis Sheffield thinks maybe causing her SOB, per advised test was normal from a cardiac standpoint, pt also wanted to ask if Dr. Purvis Sheffield was able to get her records from Dr Lyda Perone at [facility] office, pt advised that she did not sign a medical release to get these forms, advised she may have to come back into the office to get that form signed, please advise pt concerns for SOB/need for further testing/or Pulm referral

## 2013-01-05 NOTE — Telephone Encounter (Signed)
[id number] to pt to advise results/instructions. Pt understood. Placed referral in chart for pt to see a [facility]pulmonologist per pt does not have a pulmonary MD at this time, advised A staff member from our office will alert you the with appointment date and time, once available, pt understood However pt noted that she is still having numbness in her left side from her mouth to her foot that made her PCP Hasanaj place her on plavix, pt had head ct or MRI performed at [facility], pt wants Dr. Purvis Sheffield to give her a second opinion on these tests per PCP was afraid she would have a stroke, records from [place] pending to be scanned into chart

## 2013-01-05 NOTE — Telephone Encounter (Signed)
Given that her stress test was normal and her aortic stenosis is only mild at this point, I would suggest she f/u with her pulmonologist, and she can f/u with me in 6-12 months as previously scheduled

## 2013-01-05 NOTE — Telephone Encounter (Signed)
.  left message to have patient return my call.  

## 2013-01-06 DIAGNOSIS — H04559 Acquired stenosis of unspecified nasolacrimal duct: Secondary | ICD-10-CM | POA: Diagnosis not present

## 2013-01-07 ENCOUNTER — Encounter: Payer: Self-pay | Admitting: Cardiovascular Disease

## 2013-01-07 NOTE — Telephone Encounter (Signed)
Pt could consider a Neurology consultation with regards to Plavix. 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

## 2013-01-08 NOTE — Telephone Encounter (Signed)
.  left message to have patient return my call.  

## 2013-01-11 NOTE — Telephone Encounter (Signed)
.  left message to have patient return my call with pt family member who noted she is not in at this time and will have her call the office once available

## 2013-01-13 NOTE — Telephone Encounter (Signed)
Spoke to pt to advise results/instructions. Pt understood. Pt has apt with neurology on 02-04-13, pt advised someone will call her with the pulmonary apt once available per noted referral in chart Pt will call back to our office with any further concerns if necessary

## 2013-02-04 ENCOUNTER — Ambulatory Visit (INDEPENDENT_AMBULATORY_CARE_PROVIDER_SITE_OTHER): Payer: Medicare Other | Admitting: Nurse Practitioner

## 2013-02-04 ENCOUNTER — Encounter: Payer: Self-pay | Admitting: Nurse Practitioner

## 2013-02-04 VITALS — BP 156/89 | HR 75 | Temp 98.6°F | Ht 62.5 in | Wt 199.0 lb

## 2013-02-04 DIAGNOSIS — G459 Transient cerebral ischemic attack, unspecified: Secondary | ICD-10-CM

## 2013-02-04 NOTE — Patient Instructions (Addendum)
Continue Plavix for stroke prevention.  Maintain strict control of hypertension with blood pressure goal below 130/90, diabetes with hemoglobin A1c goal below 6.5% and lipids with LDL cholesterol goal below 100 mg/dL.   Followup in the future with me in 6 months.

## 2013-02-04 NOTE — Progress Notes (Signed)
[facility]  PATIENT: [name] DOB: [birth year]   HISTORY FROM: patient, chart REASON FOR VISIT: follow up   HISTORICAL  CHIEF COMPLAINT:  Chief Complaint  Patient presents with  . Follow-up    numbness    HISTORY OF PRESENT ILLNESS: Prior HPI 11/04/12 ([facility]):  Ms. Florendo is a 72 year old Caucasian female who has c/o numbness on the left side of her mouth, her left arm, and down her left leg. Comes on and lasts for about a minute, and goes away. Started several months ago, and has happened intermittently since then, sometimes once a week. She has brought it to the attention of her PCP and they sent her for carotid dopplers and brain MRI. She states her PCP put her on Plavix 75mg  daily and was put on Aspirin 81 mg daily at time of hospital discharge.  UPDATE 02/04/13 (LL): Patient comes in for follow up.  Since starting Plavix, patient states she has not had any recurrence of numbness.  BP is well controlled, do not have recent lipid panel results, has been intolerant of statins.  No ew neurovascular complaints.  Tolerating Plavix well, with no significant bruising.  REVIEW OF SYSTEMS: Full 14 system review of systems performed and notable only for swelling in legs hearing loss shortness of breath easy bruising easy bleeding joint pain joint swelling cramps aching muscles considered to be numbness weakness restless legs.  ALLERGIES: No Known Allergies  HOME MEDICATIONS: Outpatient Prescriptions Prior to Visit  Medication Sig Dispense Refill  . aspirin 81 MG tablet Take 81 mg by mouth daily.      . clopidogrel (PLAVIX) 75 MG tablet Take 75 mg by mouth daily.       . Ferrous Sulfate (IRON) 325 (65 FE) MG TABS Take 65 mg by mouth 2 (two) times a week.      . fish oil-omega-3 fatty acids 1000 MG capsule Take 2 g by mouth daily.      . Garlic (GARLIQUE) 400 MG TBEC Take 1 tablet by mouth daily.      Marland Kitchen labetalol (NORMODYNE) 300 MG tablet Take 300 mg by mouth 2  (two) times daily.      . methocarbamol (ROBAXIN) 500 MG tablet Take 500 mg by mouth 3 (three) times daily.      . naproxen sodium (ANAPROX) 220 MG tablet Take 220 mg by mouth 2 (two) times daily with a meal.      . oxyCODONE (OXY IR/ROXICODONE) 5 MG immediate release tablet 2 mg daily.       . quinapril-hydrochlorothiazide (ACCURETIC) 20-12.5 MG per tablet Take 1 tablet by mouth every morning.       . Red Yeast Rice Extract (RED YEAST RICE PO) Take by mouth daily.      Marland Kitchen Specialty Vitamins Products (MENOPAUSE RELIEF PO) Take 1 tablet by mouth daily.      Marland Kitchen triamcinolone cream (KENALOG) 0.1 % Apply 1 application topically 2 (two) times daily as needed. Rash      . vitamin C (ASCORBIC ACID) 500 MG tablet Take 500 mg by mouth daily.       No facility-administered medications prior to visit.    PAST MEDICAL HISTORY: Past Medical History  Diagnosis Date  . DJD (degenerative joint disease)     generalized  . Dyslipidemia   . Diabetes mellitus     borderline  . Squamous cell carcinoma   . Osteoarthritis   . HTN (hypertension)   . Spinal stenosis  of lumbar region 04/17/2011  . Dyspnea 09-27-11    with extreme exertion.  Marland Kitchen COPD (chronic obstructive pulmonary disease) 09-27-11    mild  . Rotator cuff syndrome 09-27-11    rotator cuff pain sometimes  . Carpal tunnel syndrome, right 09-27-11    right hand  . 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 Gallstones 09-27-11    hx. not bothered  . SOB (shortness of breath)   . Muscle ache     PAST SURGICAL HISTORY: Past Surgical History  Procedure Laterality Date  . Cataract extraction  2011    bilateral  . Lumbar laminectomy/decompression microdiscectomy  04/19/2011    Procedure: LUMBAR LAMINECTOMY/DECOMPRESSION MICRODISCECTOMY;  Surgeon: Jacki Cones;  Location: [facility];  Service: Orthopedics;  Laterality: N/A;  Central decompression Lumbar two to lumbar three and Three to Lumbar Four    (xray)  . Tubal ligation    . Joint  replacement  09-27-11    '04/ right with resection for infection, now antibiotic spacer planned  . Joint replacement      not replaced- only surgery 2007/2011/2012.  . Back surgery    . Total knee revision  12/04/2011    Procedure: TOTAL KNEE REVISION;  Surgeon: Loanne Drilling, MD;  Location: [facility];  Service: Orthopedics;  Laterality: Right;  Reimplantation of a Right Total Knee Arthroplasty    FAMILY HISTORY: Family History  Problem Relation Age of Onset  . Heart attack Mother   . Cancer Mother     cholangiocarcinoma  . Heart attack Brother   . Heart attack Brother   . Heart attack Brother   . Coronary artery disease Other   . Heart disease Father   . Diabetes      SOCIAL HISTORY: History   Social History  . Marital Status: Married    Spouse Name: Jerolyn Shin    Number of Children: 3  . Years of Education: 12   Occupational History  . Retired     [employer]   Social History Main Topics  . Smoking status: Never Smoker   . Smokeless tobacco: Never Used  . Alcohol Use: No  . Drug Use: No  . Sexual Activity: Yes   Other Topics Concern  . Not on file   Social History Narrative   Lives with husband. Retired, worked in a [employer]. Does not regularly exercise.    Caffeine Use: 2 12oz sodas daily     PHYSICAL EXAM  Filed Vitals:   02/04/13 1321  BP: 156/89  Pulse: 75  Temp: 98.6 F (37 C)  Height: 5' 2.5" (1.588 m)  Weight: 199 lb (90.266 kg)   Body mass index is 35.8 kg/(m^2).  Generalized: In no acute distress, pleasant Caucasian remale  Neck: Supple, no carotid bruits   Cardiac: Regular rate rhythm, no murmur   Pulmonary: Clear to auscultation bilaterally   Musculoskeletal: No deformity   NEUROLOGIC:  MENTAL STATUS: awake, alert, language fluent, comprehension intact, naming intact  CRANIAL NERVE: no papilledema on fundoscopic exam, pupils equal and reactive to light, visual fields full to confrontation, extraocular muscles intact, no nystagmus,  facial sensation and strength symmetric, uvula midline, shoulder shrug symmetric, tongue midline.  MOTOR: normal bulk and tone, full strength in the BUE, BLE  SENSORY: normal and symmetric to light touch, pinprick, temperature, vibration  COORDINATION: finger-nose-finger, fine finger movements normal  REFLEXES: deep tendon reflexes present and symmetric, 1+ in BUE and TRACE IN KNEES AND ANKLES  GAIT/STATION: narrow  based gait; able to walk on toes, heels and tandem; romberg is negative  DIAGNOSTIC DATA (LABS, IMAGING, TESTING) - I reviewed patient records, labs, notes, testing and imaging myself where available.  Lab Results  Component Value Date   WBC 3.6* 12/07/2011   HGB 8.9* 12/07/2011   HCT 27.7* 12/07/2011   MCV 86.0 12/07/2011   PLT 153 12/07/2011      Component Value Date/Time   NA 136 12/07/2011 0405   K 3.9 12/07/2011 0405   CL 103 12/07/2011 0405   CO2 27 12/07/2011 0405   GLUCOSE 113* 12/07/2011 0405   BUN 7 12/07/2011 0405   CREATININE 0.61 12/07/2011 0405   CALCIUM 8.8 12/07/2011 0405   PROT 7.4 11/26/2011 1205   ALBUMIN 3.9 11/26/2011 1205   AST 18 11/26/2011 1205   ALT <5 11/26/2011 1205   ALKPHOS 107 11/26/2011 1205   BILITOT 0.1* 11/26/2011 1205   GFRNONAA >90 12/07/2011 0405   GFRAA >90 12/07/2011 0405   MRA head (without)  11/10/12 There is 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  Mild-moderate atherosclerosis of the bilateral carotid siphon regions.  MRI brain (without) 11/10/12  Moderate periventricular and subcortical and pontine chronic small vessel ischemic disease.   ASSESSMENT AND PLAN 72 y.o. year old female has a past medical history of DJD (degenerative joint disease); Dyslipidemia; Diabetes mellitus; Squamous cell carcinoma; Osteoarthritis; HTN (hypertension); Spinal stenosis of lumbar region (04/17/2011); Dyspnea (09-27-11); COPD (chronic obstructive pulmonary disease) (09-27-11); Rotator cuff syndrome (09-27-11); Carpal  tunnel syndrome, right (09-27-11); Eczematous dermatitis (09-27-11); Gallstones (09-27-11); SOB (shortness of breath); and Muscle ache. here with intermittent left face, arm, leg numbness. TIA workup complete.  MRI/MRA shows moderate intracranial atherosclerosis and chronic small vessel disease.  Carotid dopplers negative for significant stenosis.  Continue aspirin and plavix for stroke prevention.  Maintain strict control of hypertension with blood pressure goal below 130/90, diabetes with hemoglobin A1c goal below 6.5% and lipids with LDL cholesterol goal below 100 mg/dL.  Requested Lipid Panel results from [facility]. Followup in the future with me in 6 months.  Layn Kye NP-C 02/04/2013, 1:42 PM  [facility] [address], [address] [place], [place] [address] [phone]

## 2013-02-05 NOTE — Progress Notes (Signed)
I reviewed note and agree with plan.   Suanne Marker, MD 02/05/2013, 5:04 PM Certified in Neurology, Neurophysiology and Neuroimaging  [facility] [address] [place], [place] [address] [phone]

## 2013-02-10 DIAGNOSIS — M549 Dorsalgia, unspecified: Secondary | ICD-10-CM | POA: Diagnosis not present

## 2013-02-10 DIAGNOSIS — E782 Mixed hyperlipidemia: Secondary | ICD-10-CM | POA: Diagnosis not present

## 2013-02-10 DIAGNOSIS — Z0000 Encounter for general adult medical examination without abnormal findings: Secondary | ICD-10-CM | POA: Diagnosis not present

## 2013-03-02 DIAGNOSIS — D0470 Carcinoma in situ of skin of unspecified lower limb, including hip: Secondary | ICD-10-CM | POA: Diagnosis not present

## 2013-03-02 DIAGNOSIS — L989 Disorder of the skin and subcutaneous tissue, unspecified: Secondary | ICD-10-CM | POA: Diagnosis not present

## 2013-03-02 DIAGNOSIS — L089 Local infection of the skin and subcutaneous tissue, unspecified: Secondary | ICD-10-CM | POA: Diagnosis not present

## 2013-03-16 DIAGNOSIS — R238 Other skin changes: Secondary | ICD-10-CM | POA: Diagnosis not present

## 2013-03-16 DIAGNOSIS — Z9889 Other specified postprocedural states: Secondary | ICD-10-CM | POA: Diagnosis not present

## 2013-03-16 DIAGNOSIS — L97809 Non-pressure chronic ulcer of other part of unspecified lower leg with unspecified severity: Secondary | ICD-10-CM | POA: Diagnosis not present

## 2013-03-22 DIAGNOSIS — C4492 Squamous cell carcinoma of skin, unspecified: Secondary | ICD-10-CM | POA: Diagnosis not present

## 2013-03-29 DIAGNOSIS — Z96659 Presence of unspecified artificial knee joint: Secondary | ICD-10-CM | POA: Diagnosis not present

## 2013-04-05 DIAGNOSIS — C4492 Squamous cell carcinoma of skin, unspecified: Secondary | ICD-10-CM | POA: Diagnosis not present

## 2013-04-19 DIAGNOSIS — E2839 Other primary ovarian failure: Secondary | ICD-10-CM | POA: Diagnosis not present

## 2013-04-19 DIAGNOSIS — M899 Disorder of bone, unspecified: Secondary | ICD-10-CM | POA: Diagnosis not present

## 2013-04-19 DIAGNOSIS — Z96659 Presence of unspecified artificial knee joint: Secondary | ICD-10-CM | POA: Diagnosis not present

## 2013-04-26 DIAGNOSIS — I10 Essential (primary) hypertension: Secondary | ICD-10-CM | POA: Diagnosis not present

## 2013-04-26 DIAGNOSIS — C4492 Squamous cell carcinoma of skin, unspecified: Secondary | ICD-10-CM | POA: Diagnosis not present

## 2013-04-26 DIAGNOSIS — Z23 Encounter for immunization: Secondary | ICD-10-CM | POA: Diagnosis not present

## 2013-04-28 DIAGNOSIS — R062 Wheezing: Secondary | ICD-10-CM | POA: Diagnosis not present

## 2013-04-28 DIAGNOSIS — R0602 Shortness of breath: Secondary | ICD-10-CM | POA: Diagnosis not present

## 2013-05-04 DIAGNOSIS — Z96659 Presence of unspecified artificial knee joint: Secondary | ICD-10-CM | POA: Diagnosis not present

## 2013-05-04 DIAGNOSIS — M25569 Pain in unspecified knee: Secondary | ICD-10-CM | POA: Diagnosis not present

## 2013-06-18 DIAGNOSIS — C4492 Squamous cell carcinoma of skin, unspecified: Secondary | ICD-10-CM | POA: Diagnosis not present

## 2013-06-18 DIAGNOSIS — C44721 Squamous cell carcinoma of skin of unspecified lower limb, including hip: Secondary | ICD-10-CM | POA: Diagnosis not present

## 2013-06-30 DIAGNOSIS — M25569 Pain in unspecified knee: Secondary | ICD-10-CM | POA: Diagnosis not present

## 2013-06-30 DIAGNOSIS — M48061 Spinal stenosis, lumbar region without neurogenic claudication: Secondary | ICD-10-CM | POA: Diagnosis not present

## 2013-07-05 DIAGNOSIS — E119 Type 2 diabetes mellitus without complications: Secondary | ICD-10-CM | POA: Diagnosis not present

## 2013-07-19 DIAGNOSIS — C44721 Squamous cell carcinoma of skin of unspecified lower limb, including hip: Secondary | ICD-10-CM | POA: Diagnosis not present

## 2013-07-19 DIAGNOSIS — I872 Venous insufficiency (chronic) (peripheral): Secondary | ICD-10-CM | POA: Diagnosis not present

## 2013-07-19 DIAGNOSIS — L570 Actinic keratosis: Secondary | ICD-10-CM | POA: Diagnosis not present

## 2013-07-20 DIAGNOSIS — M48061 Spinal stenosis, lumbar region without neurogenic claudication: Secondary | ICD-10-CM | POA: Diagnosis not present

## 2013-07-20 DIAGNOSIS — Z9889 Other specified postprocedural states: Secondary | ICD-10-CM | POA: Diagnosis not present

## 2013-07-24 DIAGNOSIS — H02059 Trichiasis without entropian unspecified eye, unspecified eyelid: Secondary | ICD-10-CM | POA: Diagnosis not present

## 2013-07-24 DIAGNOSIS — H0289 Other specified disorders of eyelid: Secondary | ICD-10-CM | POA: Diagnosis not present

## 2013-07-26 ENCOUNTER — Other Ambulatory Visit: Payer: Self-pay | Admitting: Vascular Surgery

## 2013-07-26 DIAGNOSIS — L539 Erythematous condition, unspecified: Secondary | ICD-10-CM

## 2013-08-04 DIAGNOSIS — C44721 Squamous cell carcinoma of skin of unspecified lower limb, including hip: Secondary | ICD-10-CM | POA: Diagnosis not present

## 2013-08-06 ENCOUNTER — Ambulatory Visit: Payer: Medicare Other | Admitting: Nurse Practitioner

## 2013-08-19 DIAGNOSIS — C44721 Squamous cell carcinoma of skin of unspecified lower limb, including hip: Secondary | ICD-10-CM | POA: Diagnosis not present

## 2013-08-19 DIAGNOSIS — Z85828 Personal history of other malignant neoplasm of skin: Secondary | ICD-10-CM | POA: Diagnosis not present

## 2013-08-25 ENCOUNTER — Encounter (HOSPITAL_COMMUNITY): Payer: Medicare Other

## 2013-08-25 ENCOUNTER — Encounter: Payer: Medicare Other | Admitting: Vascular Surgery

## 2013-09-02 DIAGNOSIS — L918 Other hypertrophic disorders of the skin: Secondary | ICD-10-CM | POA: Diagnosis not present

## 2013-09-02 DIAGNOSIS — Z85828 Personal history of other malignant neoplasm of skin: Secondary | ICD-10-CM | POA: Diagnosis not present

## 2013-09-16 DIAGNOSIS — L980 Pyogenic granuloma: Secondary | ICD-10-CM | POA: Diagnosis not present

## 2013-09-16 DIAGNOSIS — L570 Actinic keratosis: Secondary | ICD-10-CM | POA: Diagnosis not present

## 2013-10-07 DIAGNOSIS — C44721 Squamous cell carcinoma of skin of unspecified lower limb, including hip: Secondary | ICD-10-CM | POA: Diagnosis not present

## 2013-10-14 DIAGNOSIS — Z85828 Personal history of other malignant neoplasm of skin: Secondary | ICD-10-CM | POA: Diagnosis not present

## 2013-11-15 ENCOUNTER — Telehealth (INDEPENDENT_AMBULATORY_CARE_PROVIDER_SITE_OTHER): Payer: Self-pay

## 2013-11-15 ENCOUNTER — Encounter (INDEPENDENT_AMBULATORY_CARE_PROVIDER_SITE_OTHER): Payer: Self-pay | Admitting: Surgery

## 2013-11-15 ENCOUNTER — Ambulatory Visit (INDEPENDENT_AMBULATORY_CARE_PROVIDER_SITE_OTHER): Payer: Medicare Other | Admitting: Surgery

## 2013-11-15 VITALS — BP 126/74 | HR 78 | Temp 97.4°F | Ht 65.0 in | Wt 202.0 lb

## 2013-11-15 DIAGNOSIS — C801 Malignant (primary) neoplasm, unspecified: Secondary | ICD-10-CM | POA: Diagnosis not present

## 2013-11-15 NOTE — Patient Instructions (Signed)
Making referral to dermatology department at [facility].  Earnstine Regal, MD, [facility] Office: [phone]

## 2013-11-15 NOTE — Progress Notes (Signed)
General Surgery [facility]  Chief Complaint  Patient presents with  . New Evaluation    squamous cell carcinoma right lower leg - referral from Dr. Allyn Kenner, Brooke Bonito., dermatologist    HISTORY: Patient is a 73 year old female referred by her dermatologist for evaluation of a squamous cell carcinoma involving the posterior aspect of the right lower leg. Patient had undergone excision in April 2015. Surgical margins measured 3.4 cm in greatest diameter. Pathology showed invasive squamous cell carcinoma with a widely positive deep margin. Patient has been applying topical dressings to the open wound. She now presents for evaluation for reexcision.  In the interim the patient has noted development of multiple nodules on the anterior and lateral aspects of the right lower leg. She also has long-standing complaints saphenous stasis disease with ulcerations worse on the right than on the left.  Past Medical History  Diagnosis Date  . DJD (degenerative joint disease)     generalized  . Dyslipidemia   . Diabetes mellitus     borderline  . Squamous cell carcinoma   . Osteoarthritis   . HTN (hypertension)   . Spinal stenosis of lumbar region 04/17/2011  . Dyspnea 09-27-11    with extreme exertion.  Marland Kitchen COPD (chronic obstructive pulmonary disease) 09-27-11    mild  . Rotator cuff syndrome 09-27-11    rotator cuff pain sometimes  . Carpal tunnel syndrome, right 09-27-11    right hand  . 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 Gallstones 09-27-11    hx. not bothered  . SOB (shortness of breath)   . Muscle ache     Current Outpatient Prescriptions  Medication Sig Dispense Refill  . aspirin 81 MG tablet Take 81 mg by mouth daily.      . clopidogrel (PLAVIX) 75 MG tablet Take 75 mg by mouth daily.       . Ferrous Sulfate (IRON) 325 (65 FE) MG TABS Take 65 mg by mouth 2 (two) times a week.      . fish oil-omega-3 fatty acids 1000 MG capsule Take 2 g by mouth daily.       . Garlic (GARLIQUE) 270 MG TBEC Take 1 tablet by mouth daily.      Marland Kitchen labetalol (NORMODYNE) 300 MG tablet Take 300 mg by mouth 2 (two) times daily.      . methocarbamol (ROBAXIN) 500 MG tablet Take 500 mg by mouth 3 (three) times daily.      . naproxen sodium (ANAPROX) 220 MG tablet Take 220 mg by mouth 2 (two) times daily with a meal.      . oxyCODONE (OXY IR/ROXICODONE) 5 MG immediate release tablet 2 mg daily.       . quinapril-hydrochlorothiazide (ACCURETIC) 20-12.5 MG per tablet Take 1 tablet by mouth every morning.       . Red Yeast Rice Extract (RED YEAST RICE PO) Take by mouth daily.      Marland Kitchen Specialty Vitamins Products (MENOPAUSE RELIEF PO) Take 1 tablet by mouth daily.      Marland Kitchen sulfamethoxazole-trimethoprim (BACTRIM,SEPTRA) 400-80 MG per tablet Take 400 tablets by mouth.      . tobramycin (TOBREX) 0.3 % ophthalmic solution Place 0.03 drops into both eyes.      Marland Kitchen triamcinolone cream (KENALOG) 0.1 % Apply 1 application topically 2 (two) times daily as needed. Rash      . vitamin C (ASCORBIC ACID) 500 MG tablet Take 500 mg by mouth daily.  No current facility-administered medications for this visit.    No Known Allergies  Family History  Problem Relation Age of Onset  . Heart attack Mother   . Cancer Mother     cholangiocarcinoma  . Heart attack Brother   . Heart attack Brother   . Heart attack Brother   . Coronary artery disease Other   . Heart disease Father   . Diabetes      History   Social History  . Marital Status: Married    Spouse Name: Leane Para    Number of Children: 3  . Years of Education: 12   Occupational History  . Retired     [employer]   Social History Main Topics  . Smoking status: Never Smoker   . Smokeless tobacco: Never Used  . Alcohol Use: No  . Drug Use: No  . Sexual Activity: Yes   Other Topics Concern  . None   Social History Narrative   Lives with husband. Retired, worked in a [employer]. Does not regularly exercise.    Caffeine  Use: 2 12oz sodas daily    REVIEW OF SYSTEMS - PERTINENT POSITIVES ONLY: Chronic open wounds on right lower extremity with drainage. Chronic venous stasis disease bilateral lower tragedies, worse on the right than on the left.  EXAM: Filed Vitals:   11/15/13 1332  BP: 126/74  Pulse: 78  Temp: 97.4 F (36.3 C)    GENERAL: well-developed, well-nourished, no acute distress HEENT: normocephalic; pupils equal and reactive; sclerae clear; dentition good; mucous membranes moist EXT:  Right lower tremor the shows chronic venous stasis changes extending from the ankle to the upper calf, worse on the anterior surface, with superficial ulceration and areas with serous drainage; no sign of cellulitis; 3.5 cm in diameter round ulcerated wound posterior lower leg overlying the upper chilies tendon with granulation tissue and serous drainage; 1.5 cm raised darkened firm nodular lesions involving the medial and lateral aspect of the anterior lower leg NEURO: no gross focal deficits; no sign of tremor   LABORATORY RESULTS: See [facility] (CHL-Epic) for most recent results  RADIOLOGY RESULTS: See [facility] (CHL-Epic) for most recent results  IMPRESSION: #1 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 She will require more extensive surgery in order to achieve a negative surgical margin then we are able to perform an our practice. She will almost certainly require skin grafting or flap closure of the resulting wound. She also has further lesions on the right lower extremity which are suspicious for malignancy.  I feel the patient would best be cared for in a tertiary care setting. I am going to make arrangements for consultation at [facility] in the dermatology  department. She may also require evaluation by vascular surgery for her chronic venous stasis disease.  Patient is in agreement with this plan. We will make referral and try to get her evaluated in the near future.  Earnstine Regal, MD, [facility]  Primary Care Physician: Neale Burly, MD

## 2013-11-15 NOTE — Telephone Encounter (Signed)
Spoke with [facility] with Dr Daylene Katayama office [phone]). She requested that we fax over pts office visit, path report and demographics before making a appt for pt. Faxed these to her and she stated that she will call us back with pt's appt.

## 2013-11-15 NOTE — Addendum Note (Signed)
Addended by: Dois Davenport on: 11/15/2013 02:11 PM   Modules accepted: Orders

## 2013-11-25 DIAGNOSIS — C44721 Squamous cell carcinoma of skin of unspecified lower limb, including hip: Secondary | ICD-10-CM | POA: Diagnosis not present

## 2013-11-25 DIAGNOSIS — Z85828 Personal history of other malignant neoplasm of skin: Secondary | ICD-10-CM | POA: Diagnosis not present

## 2013-12-23 DIAGNOSIS — Z85828 Personal history of other malignant neoplasm of skin: Secondary | ICD-10-CM | POA: Diagnosis not present

## 2013-12-23 DIAGNOSIS — C44721 Squamous cell carcinoma of skin of unspecified lower limb, including hip: Secondary | ICD-10-CM | POA: Diagnosis not present

## 2013-12-31 ENCOUNTER — Encounter (INDEPENDENT_AMBULATORY_CARE_PROVIDER_SITE_OTHER): Payer: Self-pay

## 2014-01-05 DIAGNOSIS — S81009A Unspecified open wound, unspecified knee, initial encounter: Secondary | ICD-10-CM | POA: Diagnosis not present

## 2014-01-05 DIAGNOSIS — S81809A Unspecified open wound, unspecified lower leg, initial encounter: Secondary | ICD-10-CM | POA: Diagnosis not present

## 2014-01-05 DIAGNOSIS — C44721 Squamous cell carcinoma of skin of unspecified lower limb, including hip: Secondary | ICD-10-CM | POA: Diagnosis not present

## 2014-01-27 DIAGNOSIS — Z85828 Personal history of other malignant neoplasm of skin: Secondary | ICD-10-CM | POA: Diagnosis not present

## 2014-01-27 DIAGNOSIS — C44721 Squamous cell carcinoma of skin of unspecified lower limb, including hip: Secondary | ICD-10-CM | POA: Diagnosis not present

## 2014-02-10 DIAGNOSIS — I872 Venous insufficiency (chronic) (peripheral): Secondary | ICD-10-CM | POA: Diagnosis not present

## 2014-02-10 DIAGNOSIS — I87309 Chronic venous hypertension (idiopathic) without complications of unspecified lower extremity: Secondary | ICD-10-CM | POA: Diagnosis not present

## 2014-02-10 DIAGNOSIS — T85698A Other mechanical complication of other specified internal prosthetic devices, implants and grafts, initial encounter: Secondary | ICD-10-CM | POA: Diagnosis not present

## 2014-02-10 DIAGNOSIS — L97909 Non-pressure chronic ulcer of unspecified part of unspecified lower leg with unspecified severity: Secondary | ICD-10-CM | POA: Diagnosis not present

## 2014-02-10 DIAGNOSIS — R7309 Other abnormal glucose: Secondary | ICD-10-CM | POA: Diagnosis not present

## 2014-02-11 DIAGNOSIS — L97909 Non-pressure chronic ulcer of unspecified part of unspecified lower leg with unspecified severity: Secondary | ICD-10-CM | POA: Diagnosis not present

## 2014-02-11 DIAGNOSIS — I87309 Chronic venous hypertension (idiopathic) without complications of unspecified lower extremity: Secondary | ICD-10-CM | POA: Diagnosis not present

## 2014-02-16 DIAGNOSIS — L089 Local infection of the skin and subcutaneous tissue, unspecified: Secondary | ICD-10-CM | POA: Diagnosis not present

## 2014-02-16 DIAGNOSIS — E119 Type 2 diabetes mellitus without complications: Secondary | ICD-10-CM | POA: Diagnosis not present

## 2014-02-16 DIAGNOSIS — I10 Essential (primary) hypertension: Secondary | ICD-10-CM | POA: Diagnosis not present

## 2014-02-17 DIAGNOSIS — T85698A Other mechanical complication of other specified internal prosthetic devices, implants and grafts, initial encounter: Secondary | ICD-10-CM | POA: Diagnosis not present

## 2014-02-17 DIAGNOSIS — I87319 Chronic venous hypertension (idiopathic) with ulcer of unspecified lower extremity: Secondary | ICD-10-CM | POA: Diagnosis not present

## 2014-02-17 DIAGNOSIS — I872 Venous insufficiency (chronic) (peripheral): Secondary | ICD-10-CM | POA: Diagnosis not present

## 2014-02-17 DIAGNOSIS — L97909 Non-pressure chronic ulcer of unspecified part of unspecified lower leg with unspecified severity: Secondary | ICD-10-CM | POA: Diagnosis not present

## 2014-02-17 DIAGNOSIS — E1169 Type 2 diabetes mellitus with other specified complication: Secondary | ICD-10-CM | POA: Diagnosis not present

## 2014-02-21 DIAGNOSIS — L97909 Non-pressure chronic ulcer of unspecified part of unspecified lower leg with unspecified severity: Secondary | ICD-10-CM | POA: Diagnosis not present

## 2014-02-21 DIAGNOSIS — I87309 Chronic venous hypertension (idiopathic) without complications of unspecified lower extremity: Secondary | ICD-10-CM | POA: Diagnosis not present

## 2014-02-21 DIAGNOSIS — I872 Venous insufficiency (chronic) (peripheral): Secondary | ICD-10-CM | POA: Diagnosis not present

## 2014-02-21 DIAGNOSIS — T85698A Other mechanical complication of other specified internal prosthetic devices, implants and grafts, initial encounter: Secondary | ICD-10-CM | POA: Diagnosis not present

## 2014-02-21 DIAGNOSIS — I890 Lymphedema, not elsewhere classified: Secondary | ICD-10-CM | POA: Diagnosis not present

## 2014-03-03 DIAGNOSIS — T85698A Other mechanical complication of other specified internal prosthetic devices, implants and grafts, initial encounter: Secondary | ICD-10-CM | POA: Diagnosis not present

## 2014-03-03 DIAGNOSIS — E669 Obesity, unspecified: Secondary | ICD-10-CM | POA: Diagnosis not present

## 2014-03-03 DIAGNOSIS — I863 Vulval varices: Secondary | ICD-10-CM | POA: Diagnosis not present

## 2014-03-03 DIAGNOSIS — C44721 Squamous cell carcinoma of skin of unspecified lower limb, including hip: Secondary | ICD-10-CM | POA: Diagnosis not present

## 2014-03-03 DIAGNOSIS — L97909 Non-pressure chronic ulcer of unspecified part of unspecified lower leg with unspecified severity: Secondary | ICD-10-CM | POA: Diagnosis not present

## 2014-03-03 DIAGNOSIS — R7309 Other abnormal glucose: Secondary | ICD-10-CM | POA: Diagnosis not present

## 2014-03-03 DIAGNOSIS — I87309 Chronic venous hypertension (idiopathic) without complications of unspecified lower extremity: Secondary | ICD-10-CM | POA: Diagnosis not present

## 2014-03-05 DIAGNOSIS — M48061 Spinal stenosis, lumbar region without neurogenic claudication: Secondary | ICD-10-CM | POA: Diagnosis not present

## 2014-03-10 DIAGNOSIS — I87303 Chronic venous hypertension (idiopathic) without complications of bilateral lower extremity: Secondary | ICD-10-CM | POA: Diagnosis not present

## 2014-03-10 DIAGNOSIS — L97922 Non-pressure chronic ulcer of unspecified part of left lower leg with fat layer exposed: Secondary | ICD-10-CM | POA: Diagnosis not present

## 2014-03-10 DIAGNOSIS — L97912 Non-pressure chronic ulcer of unspecified part of right lower leg with fat layer exposed: Secondary | ICD-10-CM | POA: Diagnosis not present

## 2014-03-15 DIAGNOSIS — E119 Type 2 diabetes mellitus without complications: Secondary | ICD-10-CM | POA: Diagnosis not present

## 2014-03-15 DIAGNOSIS — Z961 Presence of intraocular lens: Secondary | ICD-10-CM | POA: Diagnosis not present

## 2014-03-17 DIAGNOSIS — L84 Corns and callosities: Secondary | ICD-10-CM | POA: Diagnosis not present

## 2014-03-17 DIAGNOSIS — C44722 Squamous cell carcinoma of skin of right lower limb, including hip: Secondary | ICD-10-CM | POA: Diagnosis not present

## 2014-03-17 DIAGNOSIS — I87303 Chronic venous hypertension (idiopathic) without complications of bilateral lower extremity: Secondary | ICD-10-CM | POA: Diagnosis not present

## 2014-03-17 DIAGNOSIS — L97922 Non-pressure chronic ulcer of unspecified part of left lower leg with fat layer exposed: Secondary | ICD-10-CM | POA: Diagnosis not present

## 2014-03-17 DIAGNOSIS — L97911 Non-pressure chronic ulcer of unspecified part of right lower leg limited to breakdown of skin: Secondary | ICD-10-CM | POA: Diagnosis not present

## 2014-03-17 DIAGNOSIS — C44729 Squamous cell carcinoma of skin of left lower limb, including hip: Secondary | ICD-10-CM | POA: Diagnosis not present

## 2014-03-17 DIAGNOSIS — R7302 Impaired glucose tolerance (oral): Secondary | ICD-10-CM | POA: Diagnosis not present

## 2014-03-21 DIAGNOSIS — Z23 Encounter for immunization: Secondary | ICD-10-CM | POA: Diagnosis not present

## 2014-04-21 DIAGNOSIS — L821 Other seborrheic keratosis: Secondary | ICD-10-CM | POA: Diagnosis not present

## 2014-04-21 DIAGNOSIS — C44729 Squamous cell carcinoma of skin of left lower limb, including hip: Secondary | ICD-10-CM | POA: Diagnosis not present

## 2014-04-25 ENCOUNTER — Other Ambulatory Visit: Payer: Self-pay | Admitting: Orthopedic Surgery

## 2014-04-25 DIAGNOSIS — R109 Unspecified abdominal pain: Secondary | ICD-10-CM | POA: Diagnosis not present

## 2014-04-25 DIAGNOSIS — M4806 Spinal stenosis, lumbar region: Secondary | ICD-10-CM | POA: Diagnosis not present

## 2014-04-29 ENCOUNTER — Ambulatory Visit
Admission: RE | Admit: 2014-04-29 | Discharge: 2014-04-29 | Disposition: A | Discharge status: Home / Self Care | Payer: Medicare Other | Source: Ambulatory Visit | Attending: Orthopedic Surgery | Admitting: Orthopedic Surgery

## 2014-04-29 DIAGNOSIS — K429 Umbilical hernia without obstruction or gangrene: Secondary | ICD-10-CM | POA: Diagnosis not present

## 2014-04-29 DIAGNOSIS — K8020 Calculus of gallbladder without cholecystitis without obstruction: Secondary | ICD-10-CM | POA: Diagnosis not present

## 2014-04-29 DIAGNOSIS — R109 Unspecified abdominal pain: Secondary | ICD-10-CM

## 2014-04-29 MED ORDER — IOHEXOL 300 MG/ML  SOLN
125.0000 mL | Freq: Once | INTRAMUSCULAR | Status: AC | PRN
  Administered 2014-04-29: 125 mL via INTRAVENOUS

## 2014-05-09 DIAGNOSIS — Z85828 Personal history of other malignant neoplasm of skin: Secondary | ICD-10-CM | POA: Diagnosis not present

## 2014-05-09 DIAGNOSIS — D485 Neoplasm of uncertain behavior of skin: Secondary | ICD-10-CM | POA: Diagnosis not present

## 2014-05-09 DIAGNOSIS — Z08 Encounter for follow-up examination after completed treatment for malignant neoplasm: Secondary | ICD-10-CM | POA: Diagnosis not present

## 2014-05-09 DIAGNOSIS — C44722 Squamous cell carcinoma of skin of right lower limb, including hip: Secondary | ICD-10-CM | POA: Diagnosis not present

## 2014-05-23 DIAGNOSIS — Z85828 Personal history of other malignant neoplasm of skin: Secondary | ICD-10-CM | POA: Diagnosis not present

## 2014-05-23 DIAGNOSIS — L929 Granulomatous disorder of the skin and subcutaneous tissue, unspecified: Secondary | ICD-10-CM | POA: Diagnosis not present

## 2014-05-23 DIAGNOSIS — Z08 Encounter for follow-up examination after completed treatment for malignant neoplasm: Secondary | ICD-10-CM | POA: Diagnosis not present

## 2014-05-24 DIAGNOSIS — K8020 Calculus of gallbladder without cholecystitis without obstruction: Secondary | ICD-10-CM | POA: Diagnosis not present

## 2014-06-07 DIAGNOSIS — L089 Local infection of the skin and subcutaneous tissue, unspecified: Secondary | ICD-10-CM | POA: Diagnosis not present

## 2014-06-07 DIAGNOSIS — E119 Type 2 diabetes mellitus without complications: Secondary | ICD-10-CM | POA: Diagnosis not present

## 2014-06-13 DIAGNOSIS — L570 Actinic keratosis: Secondary | ICD-10-CM | POA: Diagnosis not present

## 2014-06-13 DIAGNOSIS — Z08 Encounter for follow-up examination after completed treatment for malignant neoplasm: Secondary | ICD-10-CM | POA: Diagnosis not present

## 2014-06-13 DIAGNOSIS — Z85828 Personal history of other malignant neoplasm of skin: Secondary | ICD-10-CM | POA: Diagnosis not present

## 2014-06-13 DIAGNOSIS — I8310 Varicose veins of unspecified lower extremity with inflammation: Secondary | ICD-10-CM | POA: Diagnosis not present

## 2014-06-13 DIAGNOSIS — X32XXXD Exposure to sunlight, subsequent encounter: Secondary | ICD-10-CM | POA: Diagnosis not present

## 2014-06-13 DIAGNOSIS — L980 Pyogenic granuloma: Secondary | ICD-10-CM | POA: Diagnosis not present

## 2014-07-04 DIAGNOSIS — Z08 Encounter for follow-up examination after completed treatment for malignant neoplasm: Secondary | ICD-10-CM | POA: Diagnosis not present

## 2014-07-04 DIAGNOSIS — I872 Venous insufficiency (chronic) (peripheral): Secondary | ICD-10-CM | POA: Diagnosis not present

## 2014-07-04 DIAGNOSIS — Z85828 Personal history of other malignant neoplasm of skin: Secondary | ICD-10-CM | POA: Diagnosis not present

## 2014-07-18 DIAGNOSIS — L929 Granulomatous disorder of the skin and subcutaneous tissue, unspecified: Secondary | ICD-10-CM | POA: Diagnosis not present

## 2014-08-01 DIAGNOSIS — I872 Venous insufficiency (chronic) (peripheral): Secondary | ICD-10-CM | POA: Diagnosis not present

## 2014-08-01 DIAGNOSIS — L929 Granulomatous disorder of the skin and subcutaneous tissue, unspecified: Secondary | ICD-10-CM | POA: Diagnosis not present

## 2014-08-04 DIAGNOSIS — N390 Urinary tract infection, site not specified: Secondary | ICD-10-CM | POA: Diagnosis not present

## 2014-08-04 DIAGNOSIS — I159 Secondary hypertension, unspecified: Secondary | ICD-10-CM | POA: Diagnosis not present

## 2014-08-04 DIAGNOSIS — Z79899 Other long term (current) drug therapy: Secondary | ICD-10-CM | POA: Diagnosis not present

## 2014-08-04 DIAGNOSIS — E876 Hypokalemia: Secondary | ICD-10-CM | POA: Diagnosis not present

## 2014-08-04 DIAGNOSIS — K8020 Calculus of gallbladder without cholecystitis without obstruction: Secondary | ICD-10-CM | POA: Diagnosis not present

## 2014-08-04 DIAGNOSIS — I700 Atherosclerosis of aorta: Secondary | ICD-10-CM | POA: Diagnosis not present

## 2014-08-04 DIAGNOSIS — R109 Unspecified abdominal pain: Secondary | ICD-10-CM | POA: Diagnosis not present

## 2014-08-04 DIAGNOSIS — Z8489 Family history of other specified conditions: Secondary | ICD-10-CM | POA: Diagnosis not present

## 2014-08-04 DIAGNOSIS — Z7902 Long term (current) use of antithrombotics/antiplatelets: Secondary | ICD-10-CM | POA: Diagnosis not present

## 2014-08-04 DIAGNOSIS — N281 Cyst of kidney, acquired: Secondary | ICD-10-CM | POA: Diagnosis not present

## 2014-08-04 DIAGNOSIS — Z8249 Family history of ischemic heart disease and other diseases of the circulatory system: Secondary | ICD-10-CM | POA: Diagnosis not present

## 2014-08-05 DIAGNOSIS — I10 Essential (primary) hypertension: Secondary | ICD-10-CM | POA: Diagnosis not present

## 2014-08-05 DIAGNOSIS — N390 Urinary tract infection, site not specified: Secondary | ICD-10-CM | POA: Diagnosis not present

## 2014-08-05 DIAGNOSIS — E119 Type 2 diabetes mellitus without complications: Secondary | ICD-10-CM | POA: Diagnosis not present

## 2014-08-05 DIAGNOSIS — E1165 Type 2 diabetes mellitus with hyperglycemia: Secondary | ICD-10-CM | POA: Diagnosis not present

## 2014-08-09 ENCOUNTER — Encounter (HOSPITAL_COMMUNITY): Payer: Self-pay | Admitting: Emergency Medicine

## 2014-08-09 DIAGNOSIS — Z872 Personal history of diseases of the skin and subcutaneous tissue: Secondary | ICD-10-CM | POA: Diagnosis not present

## 2014-08-09 DIAGNOSIS — Z85828 Personal history of other malignant neoplasm of skin: Secondary | ICD-10-CM | POA: Insufficient documentation

## 2014-08-09 DIAGNOSIS — Z7902 Long term (current) use of antithrombotics/antiplatelets: Secondary | ICD-10-CM | POA: Insufficient documentation

## 2014-08-09 DIAGNOSIS — Y929 Unspecified place or not applicable: Secondary | ICD-10-CM | POA: Insufficient documentation

## 2014-08-09 DIAGNOSIS — Z7982 Long term (current) use of aspirin: Secondary | ICD-10-CM | POA: Diagnosis not present

## 2014-08-09 DIAGNOSIS — M1990 Unspecified osteoarthritis, unspecified site: Secondary | ICD-10-CM | POA: Diagnosis not present

## 2014-08-09 DIAGNOSIS — S3992XA Unspecified injury of lower back, initial encounter: Secondary | ICD-10-CM | POA: Insufficient documentation

## 2014-08-09 DIAGNOSIS — J449 Chronic obstructive pulmonary disease, unspecified: Secondary | ICD-10-CM | POA: Diagnosis not present

## 2014-08-09 DIAGNOSIS — S79912A Unspecified injury of left hip, initial encounter: Secondary | ICD-10-CM | POA: Insufficient documentation

## 2014-08-09 DIAGNOSIS — I10 Essential (primary) hypertension: Secondary | ICD-10-CM | POA: Insufficient documentation

## 2014-08-09 DIAGNOSIS — Z791 Long term (current) use of non-steroidal anti-inflammatories (NSAID): Secondary | ICD-10-CM | POA: Insufficient documentation

## 2014-08-09 DIAGNOSIS — Z8639 Personal history of other endocrine, nutritional and metabolic disease: Secondary | ICD-10-CM | POA: Insufficient documentation

## 2014-08-09 DIAGNOSIS — M545 Low back pain: Secondary | ICD-10-CM | POA: Diagnosis not present

## 2014-08-09 DIAGNOSIS — Y998 Other external cause status: Secondary | ICD-10-CM | POA: Insufficient documentation

## 2014-08-09 DIAGNOSIS — W010XXA Fall on same level from slipping, tripping and stumbling without subsequent striking against object, initial encounter: Secondary | ICD-10-CM | POA: Insufficient documentation

## 2014-08-09 DIAGNOSIS — Y9301 Activity, walking, marching and hiking: Secondary | ICD-10-CM | POA: Insufficient documentation

## 2014-08-09 DIAGNOSIS — Z79899 Other long term (current) drug therapy: Secondary | ICD-10-CM | POA: Insufficient documentation

## 2014-08-09 DIAGNOSIS — M25552 Pain in left hip: Secondary | ICD-10-CM | POA: Diagnosis not present

## 2014-08-09 NOTE — ED Notes (Signed)
Pt c/o left hip pain, st's she fell approx 4-5 weeks ago and has pain since then

## 2014-08-10 ENCOUNTER — Emergency Department (HOSPITAL_COMMUNITY)
Admission: EM | Admit: 2014-08-10 | Discharge: 2014-08-10 | Disposition: A | Discharge status: Home / Self Care | Payer: Medicare Other | Attending: Emergency Medicine | Admitting: Emergency Medicine

## 2014-08-10 ENCOUNTER — Emergency Department (HOSPITAL_COMMUNITY): Payer: Medicare Other

## 2014-08-10 DIAGNOSIS — M545 Low back pain, unspecified: Secondary | ICD-10-CM

## 2014-08-10 DIAGNOSIS — S3992XA Unspecified injury of lower back, initial encounter: Secondary | ICD-10-CM | POA: Diagnosis not present

## 2014-08-10 DIAGNOSIS — R52 Pain, unspecified: Secondary | ICD-10-CM

## 2014-08-10 DIAGNOSIS — S79912A Unspecified injury of left hip, initial encounter: Secondary | ICD-10-CM | POA: Diagnosis not present

## 2014-08-10 DIAGNOSIS — M25552 Pain in left hip: Secondary | ICD-10-CM | POA: Diagnosis not present

## 2014-08-10 LAB — URINALYSIS, ROUTINE W REFLEX MICROSCOPIC
BILIRUBIN URINE: NEGATIVE
Glucose, UA: NEGATIVE mg/dL
HGB URINE DIPSTICK: NEGATIVE
KETONES UR: 15 mg/dL — AB
NITRITE: NEGATIVE
PROTEIN: 30 mg/dL — AB
Specific Gravity, Urine: 1.035 — ABNORMAL HIGH (ref 1.005–1.030)
UROBILINOGEN UA: 0.2 mg/dL (ref 0.0–1.0)
pH: 5 (ref 5.0–8.0)

## 2014-08-10 LAB — URINE MICROSCOPIC-ADD ON

## 2014-08-10 MED ORDER — OXYCODONE-ACETAMINOPHEN 5-325 MG PO TABS
2.0000 | ORAL_TABLET | Freq: Once | ORAL | Status: AC
Start: 2014-08-10 — End: 2014-08-10
  Administered 2014-08-10: 2 via ORAL
  Filled 2014-08-10: qty 2

## 2014-08-10 MED ORDER — METHOCARBAMOL 500 MG PO TABS
1000.0000 mg | ORAL_TABLET | Freq: Once | ORAL | Status: AC
  Administered 2014-08-10: 1000 mg via ORAL
  Filled 2014-08-10: qty 2

## 2014-08-10 MED ORDER — METHOCARBAMOL 500 MG PO TABS: 500.0000 mg | ORAL_TABLET | Freq: Three times a day (TID) | ORAL | Status: DC

## 2014-08-10 MED ORDER — OXYCODONE-ACETAMINOPHEN 5-325 MG PO TABS: 2.0000 | ORAL_TABLET | ORAL | Status: DC | PRN

## 2014-08-10 NOTE — ED Provider Notes (Signed)
CSN: [id number]     Arrival date & time 08/09/14  2109 History  This chart was scribed for Kalman Drape, MD by Rayfield Citizen, ED Scribe. This patient was seen in room D33C/D33C and the patient's care was started at 12:30 AM.    Chief Complaint  Patient presents with  . Hip Pain   Patient is a 74 y.o. female presenting with hip pain. The history is provided by the patient. No language interpreter was used.  Hip Pain     HPI Comments: RODINA PINALES is a 74 y.o. female with past medical history of DM, osteoarthritis, who presents to the Emergency Department complaining of 5 weeks of gradually worsening left-sided lower back pain and left buttock pain after a mechanical fall. This pain occasionally radiates down the back of her left leg. She denies bowel or bladder incontinence. Regarding her fall; patient explains she was walking on a hardwood floor and slipped; she bore the brunt of the fall on her back.   She has previously been prescribed oxycodone for knee pain after many knee replacements; she has been off of this medication for approximately 1 month. Prior orthopedic care [facility]. She is in the process of connecting with a pain clinic in [place].   Past Medical History  Diagnosis Date  . DJD (degenerative joint disease)     generalized  . Dyslipidemia   . Diabetes mellitus     borderline  . Squamous cell carcinoma   . Osteoarthritis   . HTN (hypertension)   . Spinal stenosis of lumbar region 04/17/2011  . Dyspnea 09-27-11    with extreme exertion.  Marland Kitchen COPD (chronic obstructive pulmonary disease) 09-27-11    mild  . Rotator cuff syndrome 09-27-11    rotator cuff pain sometimes  . Carpal tunnel syndrome, right 09-27-11    right hand  . 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 Gallstones 09-27-11    hx. not bothered  . SOB (shortness of breath)   . Muscle ache    Past Surgical History  Procedure Laterality Date  . Cataract extraction  2011    bilateral  . Lumbar  laminectomy/decompression microdiscectomy  04/19/2011    Procedure: LUMBAR LAMINECTOMY/DECOMPRESSION MICRODISCECTOMY;  Surgeon: Tobi Bastos;  Location: [facility];  Service: Orthopedics;  Laterality: N/A;  Central decompression Lumbar two to lumbar three and Three to Lumbar Four    (xray)  . Tubal ligation    . Joint replacement  09-27-11    '04/ right with resection for infection, now antibiotic spacer planned  . Joint replacement      not replaced- only surgery 2007/2011/2012.  . Back surgery    . Total knee revision  12/04/2011    Procedure: TOTAL KNEE REVISION;  Surgeon: Gearlean Alf, MD;  Location: [facility];  Service: Orthopedics;  Laterality: Right;  Reimplantation of a Right Total Knee Arthroplasty   Family History  Problem Relation Age of Onset  . Heart attack Mother   . Cancer Mother     cholangiocarcinoma  . Heart attack Brother   . Heart attack Brother   . Heart attack Brother   . Coronary artery disease Other   . Heart disease Father   . Diabetes     History  Substance Use Topics  . Smoking status: Never Smoker   . Smokeless tobacco: Never Used  . Alcohol Use: No   OB History    No data available     Review of Systems  Musculoskeletal: Positive for back pain.  All other systems reviewed and are negative.   Allergies  Review of patient's allergies indicates no known allergies.  Home Medications   Prior to Admission medications   Medication Sig Start Date End Date Taking? Authorizing Provider  aspirin 81 MG tablet Take 81 mg by mouth daily.    Historical Provider, MD  clopidogrel (PLAVIX) 75 MG tablet Take 75 mg by mouth daily.  10/19/12   Historical Provider, MD  Ferrous Sulfate (IRON) 325 (65 FE) MG TABS Take 65 mg by mouth 2 (two) times a week.    Historical Provider, MD  fish oil-omega-3 fatty acids 1000 MG capsule Take 2 g by mouth daily.    Historical Provider, MD  Garlic (GARLIQUE) 638 MG TBEC Take 1 tablet by mouth daily.    Historical Provider, MD   labetalol (NORMODYNE) 300 MG tablet Take 300 mg by mouth 2 (two) times daily.    Historical Provider, MD  methocarbamol (ROBAXIN) 500 MG tablet Take 500 mg by mouth 3 (three) times daily.    Historical Provider, MD  naproxen sodium (ANAPROX) 220 MG tablet Take 220 mg by mouth 2 (two) times daily with a meal.    Historical Provider, MD  oxyCODONE (OXY IR/ROXICODONE) 5 MG immediate release tablet 2 mg daily.  09/25/12   Historical Provider, MD  quinapril-hydrochlorothiazide (ACCURETIC) 20-12.5 MG per tablet Take 1 tablet by mouth every morning.     Historical Provider, MD  Red Yeast Rice Extract (RED YEAST RICE PO) Take by mouth daily.    Historical Provider, MD  Specialty Vitamins Products (MENOPAUSE RELIEF PO) Take 1 tablet by mouth daily.    Historical Provider, MD  sulfamethoxazole-trimethoprim (BACTRIM,SEPTRA) 400-80 MG per tablet Take 400 tablets by mouth. 01/21/13   Historical Provider, MD  tobramycin (TOBREX) 0.3 % ophthalmic solution Place 0.03 drops into both eyes. 01/06/13   Historical Provider, MD  triamcinolone cream (KENALOG) 0.1 % Apply 1 application topically 2 (two) times daily as needed. Rash    Historical Provider, MD  vitamin C (ASCORBIC ACID) 500 MG tablet Take 500 mg by mouth daily.    Historical Provider, MD   BP 157/74 mmHg  Pulse 70  Temp(Src) 97.9 F (36.6 C) (Oral)  Resp 20  Ht 5\' 5"  (1.651 m)  Wt 192 lb (87.091 kg)  BMI 31.95 kg/m2  SpO2 98% Physical Exam  Constitutional: She is oriented to person, place, and time. She appears well-developed and well-nourished. No distress.  HENT:  Head: Normocephalic and atraumatic.  Mouth/Throat: Oropharynx is clear and moist. No oropharyngeal exudate.  Eyes: EOM are normal. Pupils are equal, round, and reactive to light.  Neck: Normal range of motion. Neck supple.  Cardiovascular: Normal rate, regular rhythm and normal heart sounds.  Exam reveals no gallop and no friction rub.   No murmur heard. Pulmonary/Chest: Effort  normal. No respiratory distress. She has no wheezes. She has no rales.  Abdominal: Soft. There is no tenderness. There is no rebound and no guarding.  Musculoskeletal: Normal range of motion. She exhibits tenderness (Tender over left SI joint; no crepitus, no back tenderness; normal ROM of her left hip). She exhibits no edema.  Neurological: She is alert and oriented to person, place, and time.  Skin: Skin is warm and dry. No rash noted.  Psychiatric: She has a normal mood and affect. Her behavior is normal.  Nursing note and vitals reviewed.   ED Course  Procedures   DIAGNOSTIC STUDIES: Oxygen Saturation is  98% on RA, normal by my interpretation.    COORDINATION OF CARE: 12:42 AM Discussed treatment plan with pt at bedside and pt agreed to plan.   Labs Review Labs Reviewed  URINALYSIS, ROUTINE W REFLEX MICROSCOPIC - Abnormal; Notable for the following:    APPearance CLOUDY (*)    Specific Gravity, Urine 1.035 (*)    Ketones, ur 15 (*)    Protein, ur 30 (*)    Leukocytes, UA TRACE (*)    All other components within normal limits  URINE MICROSCOPIC-ADD ON - Abnormal; Notable for the following:    Squamous Epithelial / LPF MANY (*)    All other components within normal limits  URINE CULTURE    Imaging Review Dg Hip Unilat With Pelvis 2-3 Views Left  08/10/2014   CLINICAL DATA:  Status post fall 2 weeks ago, with persistent left posterior hip pain. Initial encounter.  EXAM: LEFT HIP (WITH PELVIS) 2-3 VIEWS  COMPARISON:  None.  FINDINGS: There is no evidence of fracture or dislocation. Both femoral heads are seated normally within their respective acetabula. The proximal left femur appears intact. Mild degenerative change is noted at the pubic symphysis, sacroiliac joints and lower lumbar spine.  The visualized bowel gas pattern is grossly unremarkable in appearance.  IMPRESSION: No evidence of fracture or dislocation.   Electronically Signed   By: Garald Balding M.D.   On: 08/10/2014  02:06     EKG Interpretation None      MDM   Final diagnoses:  Pain  Left-sided low back pain without sciatica   74 year old female with a month history of low back and buttock pain after a fall.  Patient has multiple prior knee and back surgeries.  She reports she was recently taken off her chronic oxycodone by her orthopedist to her working getting her into a pain clinic.  Patient also reports she was seen at outside hospital recently with UTI.  Plan at this time is to get left hip films, urinalysis.  Treat for pain.  Patient will be encouraged to follow back up with her orthopedist or primary care doctor for referral to physical therapy.  Based on her physical exam.  Pain over the SI joint, I feel it's most likely arthritic or sciatica that's causing her problems.  There are no red flags on exam to indicate cauda equina.  Patient observed ambulating without difficulty from the bathroom to her room.  I personally performed the services described in this documentation, which was scribed in my presence. The recorded information has been reviewed and is accurate.       Kalman Drape, MD 08/10/14 [phone]

## 2014-08-10 NOTE — Discharge Instructions (Signed)
Take medications as prescribed.  It is very important for you to follow-up with your primary care doctor or your orthopedic clinic for referral to physical therapy for further evaluation and treatment of your ongoing pain.  Warm moist heat to the area may help with symptoms.  Avoid lifting anything heavier than 20 pounds.  Return to the emergency department for worsening pain, inability to urinate or incontinence of bowel or bladder.   Back Exercises Back exercises help treat and prevent back injuries. The goal of back exercises is to increase the strength of your abdominal and back muscles and the flexibility of your back. These exercises should be started when you no longer have back pain. Back exercises include:  Pelvic Tilt. Lie on your back with your knees bent. Tilt your pelvis until the lower part of your back is against the floor. Hold this position 5 to 10 sec and repeat 5 to 10 times.  Knee to Chest. Pull first 1 knee up against your chest and hold for 20 to 30 seconds, repeat this with the other knee, and then both knees. This may be done with the other leg straight or bent, whichever feels better.  Sit-Ups or Curl-Ups. Bend your knees 90 degrees. Start with tilting your pelvis, and do a partial, slow sit-up, lifting your trunk only 30 to 45 degrees off the floor. Take at least 2 to 3 seconds for each sit-up. Do not do sit-ups with your knees out straight. If partial sit-ups are difficult, simply do the above but with only tightening your abdominal muscles and holding it as directed.  Hip-Lift. Lie on your back with your knees flexed 90 degrees. Push down with your feet and shoulders as you raise your hips a couple inches off the floor; hold for 10 seconds, repeat 5 to 10 times.  Back arches. Lie on your stomach, propping yourself up on bent elbows. Slowly press on your hands, causing an arch in your low back. Repeat 3 to 5 times. Any initial stiffness and discomfort should lessen with  repetition over time.  Shoulder-Lifts. Lie face down with arms beside your body. Keep hips and torso pressed to floor as you slowly lift your head and shoulders off the floor. Do not overdo your exercises, especially in the beginning. Exercises may cause you some mild back discomfort which lasts for a few minutes; however, if the pain is more severe, or lasts for more than 15 minutes, do not continue exercises until you see your caregiver. Improvement with exercise therapy for back problems is slow.  See your caregivers for assistance with developing a proper back exercise program. Document Released: 07/04/2004 Document Revised: 08/19/2011 Document Reviewed: 03/28/2011 [facility] Patient Information 2015 [place], [place]. This information is not intended to replace advice given to you by your health care provider. Make sure you discuss any questions you have with your health care provider.  Heat Therapy Heat therapy can help make painful, stiff muscles and joints feel better. Do not use heat on new injuries. Wait at least 48 hours after an injury to use heat. Do not use heat when you have aches or pains right after an activity. If you still have pain 3 hours after stopping the activity, then you may use heat. HOME CARE Wet heat pack  Soak a clean towel in warm water. Squeeze out the extra water.  Put the warm, wet towel in a plastic bag.  Place a thin, dry towel between your skin and the bag.  Put  the heat pack on the area for 5 minutes, and check your skin. Your skin may be pink, but it should not be red.  Leave the heat pack on the area for 15 to 30 minutes.  Repeat this every 2 to 4 hours while awake. Do not use heat while you are sleeping. Warm water bath  Fill a tub with warm water.  Place the affected body part in the tub.  Soak the area for 20 to 40 minutes.  Repeat as needed. Hot water bottle  Fill the water bottle half full with hot water.  Press out the extra air. Close  the cap tightly.  Place a dry towel between your skin and the bottle.  Put the bottle on the area for 5 minutes, and check your skin. Your skin may be pink, but it should not be red.  Leave the bottle on the area for 15 to 30 minutes.  Repeat this every 2 to 4 hours while awake. Electric heating pad  Place a dry towel between your skin and the heating pad.  Set the heating pad on low heat.  Put the heating pad on the area for 10 minutes, and check your skin. Your skin may be pink, but it should not be red.  Leave the heating pad on the area for 20 to 40 minutes.  Repeat this every 2 to 4 hours while awake.  Do not lie on the heating pad.  Do not fall asleep while using the heating pad.  Do not use the heating pad near water. GET HELP RIGHT AWAY IF:  You get blisters or red skin.  Your skin is puffy (swollen), or you lose feeling (numbness) in the affected area.  You have any new problems.  Your problems are getting worse.  You have any questions or concerns. If you have any problems, stop using heat therapy until you see your doctor. MAKE SURE YOU:  Understand these instructions.  Will watch your condition.  Will get help right away if you are not doing well or get worse. Document Released: 08/19/2011 Document Reviewed: 07/20/2013 [facility] Patient Information 2015 [employer]. This information is not intended to replace advice given to you by your health care provider. Make sure you discuss any questions you have with your health care provider.

## 2014-08-11 LAB — URINE CULTURE
Colony Count: NO GROWTH
Culture: NO GROWTH

## 2014-08-15 DIAGNOSIS — N390 Urinary tract infection, site not specified: Secondary | ICD-10-CM | POA: Diagnosis not present

## 2014-08-16 DIAGNOSIS — I10 Essential (primary) hypertension: Secondary | ICD-10-CM | POA: Diagnosis not present

## 2014-08-16 DIAGNOSIS — M25552 Pain in left hip: Secondary | ICD-10-CM | POA: Diagnosis not present

## 2014-08-16 DIAGNOSIS — M4806 Spinal stenosis, lumbar region: Secondary | ICD-10-CM | POA: Diagnosis not present

## 2014-08-16 DIAGNOSIS — Z96652 Presence of left artificial knee joint: Secondary | ICD-10-CM | POA: Diagnosis not present

## 2014-08-16 DIAGNOSIS — Z7902 Long term (current) use of antithrombotics/antiplatelets: Secondary | ICD-10-CM | POA: Diagnosis not present

## 2014-08-16 DIAGNOSIS — M160 Bilateral primary osteoarthritis of hip: Secondary | ICD-10-CM | POA: Diagnosis not present

## 2014-08-16 DIAGNOSIS — M1612 Unilateral primary osteoarthritis, left hip: Secondary | ICD-10-CM | POA: Diagnosis not present

## 2014-08-16 DIAGNOSIS — M5136 Other intervertebral disc degeneration, lumbar region: Secondary | ICD-10-CM | POA: Diagnosis not present

## 2014-08-16 DIAGNOSIS — E119 Type 2 diabetes mellitus without complications: Secondary | ICD-10-CM | POA: Diagnosis not present

## 2014-08-16 DIAGNOSIS — G5702 Lesion of sciatic nerve, left lower limb: Secondary | ICD-10-CM | POA: Diagnosis not present

## 2014-08-17 DIAGNOSIS — M4806 Spinal stenosis, lumbar region: Secondary | ICD-10-CM | POA: Diagnosis not present

## 2014-08-17 DIAGNOSIS — G5702 Lesion of sciatic nerve, left lower limb: Secondary | ICD-10-CM | POA: Diagnosis not present

## 2014-08-17 DIAGNOSIS — M25552 Pain in left hip: Secondary | ICD-10-CM | POA: Diagnosis not present

## 2014-08-18 DIAGNOSIS — M25552 Pain in left hip: Secondary | ICD-10-CM | POA: Diagnosis not present

## 2014-08-18 DIAGNOSIS — G5702 Lesion of sciatic nerve, left lower limb: Secondary | ICD-10-CM | POA: Diagnosis not present

## 2014-08-18 DIAGNOSIS — M4806 Spinal stenosis, lumbar region: Secondary | ICD-10-CM | POA: Diagnosis not present

## 2014-08-22 DIAGNOSIS — G5702 Lesion of sciatic nerve, left lower limb: Secondary | ICD-10-CM | POA: Diagnosis not present

## 2014-08-22 DIAGNOSIS — M25552 Pain in left hip: Secondary | ICD-10-CM | POA: Diagnosis not present

## 2014-08-22 DIAGNOSIS — M4806 Spinal stenosis, lumbar region: Secondary | ICD-10-CM | POA: Diagnosis not present

## 2014-08-26 DIAGNOSIS — M25552 Pain in left hip: Secondary | ICD-10-CM | POA: Diagnosis not present

## 2014-08-26 DIAGNOSIS — M4806 Spinal stenosis, lumbar region: Secondary | ICD-10-CM | POA: Diagnosis not present

## 2014-08-26 DIAGNOSIS — G5702 Lesion of sciatic nerve, left lower limb: Secondary | ICD-10-CM | POA: Diagnosis not present

## 2014-08-30 DIAGNOSIS — Z0000 Encounter for general adult medical examination without abnormal findings: Secondary | ICD-10-CM | POA: Diagnosis not present

## 2014-08-30 DIAGNOSIS — E1165 Type 2 diabetes mellitus with hyperglycemia: Secondary | ICD-10-CM | POA: Diagnosis not present

## 2014-08-30 DIAGNOSIS — I10 Essential (primary) hypertension: Secondary | ICD-10-CM | POA: Diagnosis not present

## 2014-08-30 DIAGNOSIS — Z1389 Encounter for screening for other disorder: Secondary | ICD-10-CM | POA: Diagnosis not present

## 2014-08-30 DIAGNOSIS — M25552 Pain in left hip: Secondary | ICD-10-CM | POA: Diagnosis not present

## 2014-08-30 DIAGNOSIS — M4806 Spinal stenosis, lumbar region: Secondary | ICD-10-CM | POA: Diagnosis not present

## 2014-08-30 DIAGNOSIS — G5702 Lesion of sciatic nerve, left lower limb: Secondary | ICD-10-CM | POA: Diagnosis not present

## 2014-09-01 DIAGNOSIS — Z1231 Encounter for screening mammogram for malignant neoplasm of breast: Secondary | ICD-10-CM | POA: Diagnosis not present

## 2014-09-01 DIAGNOSIS — G5702 Lesion of sciatic nerve, left lower limb: Secondary | ICD-10-CM | POA: Diagnosis not present

## 2014-09-01 DIAGNOSIS — Z9882 Breast implant status: Secondary | ICD-10-CM | POA: Diagnosis not present

## 2014-09-01 DIAGNOSIS — M4806 Spinal stenosis, lumbar region: Secondary | ICD-10-CM | POA: Diagnosis not present

## 2014-09-01 DIAGNOSIS — M25552 Pain in left hip: Secondary | ICD-10-CM | POA: Diagnosis not present

## 2014-09-05 DIAGNOSIS — M4806 Spinal stenosis, lumbar region: Secondary | ICD-10-CM | POA: Diagnosis not present

## 2014-09-05 DIAGNOSIS — M25552 Pain in left hip: Secondary | ICD-10-CM | POA: Diagnosis not present

## 2014-09-05 DIAGNOSIS — G5702 Lesion of sciatic nerve, left lower limb: Secondary | ICD-10-CM | POA: Diagnosis not present

## 2014-09-08 DIAGNOSIS — M25552 Pain in left hip: Secondary | ICD-10-CM | POA: Diagnosis not present

## 2014-09-08 DIAGNOSIS — G5702 Lesion of sciatic nerve, left lower limb: Secondary | ICD-10-CM | POA: Diagnosis not present

## 2014-09-08 DIAGNOSIS — M4806 Spinal stenosis, lumbar region: Secondary | ICD-10-CM | POA: Diagnosis not present

## 2014-09-13 DIAGNOSIS — M25552 Pain in left hip: Secondary | ICD-10-CM | POA: Diagnosis not present

## 2014-09-13 DIAGNOSIS — G5702 Lesion of sciatic nerve, left lower limb: Secondary | ICD-10-CM | POA: Diagnosis not present

## 2014-09-13 DIAGNOSIS — M4806 Spinal stenosis, lumbar region: Secondary | ICD-10-CM | POA: Diagnosis not present

## 2014-09-15 DIAGNOSIS — Z6832 Body mass index (BMI) 32.0-32.9, adult: Secondary | ICD-10-CM | POA: Diagnosis not present

## 2014-09-15 DIAGNOSIS — N814 Uterovaginal prolapse, unspecified: Secondary | ICD-10-CM | POA: Diagnosis not present

## 2014-09-15 DIAGNOSIS — Z01419 Encounter for gynecological examination (general) (routine) without abnormal findings: Secondary | ICD-10-CM | POA: Diagnosis not present

## 2014-09-23 DIAGNOSIS — Z79891 Long term (current) use of opiate analgesic: Secondary | ICD-10-CM | POA: Diagnosis not present

## 2014-09-23 DIAGNOSIS — M5136 Other intervertebral disc degeneration, lumbar region: Secondary | ICD-10-CM | POA: Diagnosis not present

## 2014-09-23 DIAGNOSIS — M47816 Spondylosis without myelopathy or radiculopathy, lumbar region: Secondary | ICD-10-CM | POA: Diagnosis not present

## 2014-09-23 DIAGNOSIS — M4316 Spondylolisthesis, lumbar region: Secondary | ICD-10-CM | POA: Diagnosis not present

## 2014-09-23 DIAGNOSIS — M4317 Spondylolisthesis, lumbosacral region: Secondary | ICD-10-CM | POA: Diagnosis not present

## 2014-09-23 DIAGNOSIS — M545 Low back pain: Secondary | ICD-10-CM | POA: Diagnosis not present

## 2014-09-29 DIAGNOSIS — M4316 Spondylolisthesis, lumbar region: Secondary | ICD-10-CM | POA: Diagnosis not present

## 2014-09-29 DIAGNOSIS — R937 Abnormal findings on diagnostic imaging of other parts of musculoskeletal system: Secondary | ICD-10-CM | POA: Diagnosis not present

## 2014-09-29 DIAGNOSIS — M4806 Spinal stenosis, lumbar region: Secondary | ICD-10-CM | POA: Diagnosis not present

## 2014-09-29 DIAGNOSIS — M5136 Other intervertebral disc degeneration, lumbar region: Secondary | ICD-10-CM | POA: Diagnosis not present

## 2014-09-29 DIAGNOSIS — M47816 Spondylosis without myelopathy or radiculopathy, lumbar region: Secondary | ICD-10-CM | POA: Diagnosis not present

## 2014-09-29 DIAGNOSIS — Z9889 Other specified postprocedural states: Secondary | ICD-10-CM | POA: Diagnosis not present

## 2014-10-20 DIAGNOSIS — Z79899 Other long term (current) drug therapy: Secondary | ICD-10-CM | POA: Diagnosis not present

## 2014-10-20 DIAGNOSIS — M4806 Spinal stenosis, lumbar region: Secondary | ICD-10-CM | POA: Diagnosis not present

## 2014-10-20 DIAGNOSIS — M7138 Other bursal cyst, other site: Secondary | ICD-10-CM | POA: Diagnosis not present

## 2014-10-20 DIAGNOSIS — Z79891 Long term (current) use of opiate analgesic: Secondary | ICD-10-CM | POA: Diagnosis not present

## 2014-10-20 DIAGNOSIS — I10 Essential (primary) hypertension: Secondary | ICD-10-CM | POA: Diagnosis not present

## 2014-10-20 DIAGNOSIS — M47816 Spondylosis without myelopathy or radiculopathy, lumbar region: Secondary | ICD-10-CM | POA: Diagnosis not present

## 2014-10-20 DIAGNOSIS — Z96653 Presence of artificial knee joint, bilateral: Secondary | ICD-10-CM | POA: Diagnosis not present

## 2014-10-20 DIAGNOSIS — G8929 Other chronic pain: Secondary | ICD-10-CM | POA: Diagnosis not present

## 2014-10-20 DIAGNOSIS — Z7902 Long term (current) use of antithrombotics/antiplatelets: Secondary | ICD-10-CM | POA: Diagnosis not present

## 2014-10-20 DIAGNOSIS — M5136 Other intervertebral disc degeneration, lumbar region: Secondary | ICD-10-CM | POA: Diagnosis not present

## 2014-10-20 DIAGNOSIS — M4316 Spondylolisthesis, lumbar region: Secondary | ICD-10-CM | POA: Diagnosis not present

## 2014-10-20 DIAGNOSIS — Z888 Allergy status to other drugs, medicaments and biological substances status: Secondary | ICD-10-CM | POA: Diagnosis not present

## 2014-11-16 DIAGNOSIS — Z79891 Long term (current) use of opiate analgesic: Secondary | ICD-10-CM | POA: Diagnosis not present

## 2014-11-16 DIAGNOSIS — M5136 Other intervertebral disc degeneration, lumbar region: Secondary | ICD-10-CM | POA: Diagnosis not present

## 2014-11-16 DIAGNOSIS — M4316 Spondylolisthesis, lumbar region: Secondary | ICD-10-CM | POA: Diagnosis not present

## 2014-12-01 DIAGNOSIS — I10 Essential (primary) hypertension: Secondary | ICD-10-CM | POA: Diagnosis not present

## 2014-12-01 DIAGNOSIS — E1165 Type 2 diabetes mellitus with hyperglycemia: Secondary | ICD-10-CM | POA: Diagnosis not present

## 2014-12-03 DIAGNOSIS — H26493 Other secondary cataract, bilateral: Secondary | ICD-10-CM | POA: Diagnosis not present

## 2014-12-03 DIAGNOSIS — Z961 Presence of intraocular lens: Secondary | ICD-10-CM | POA: Diagnosis not present

## 2014-12-03 DIAGNOSIS — R233 Spontaneous ecchymoses: Secondary | ICD-10-CM | POA: Diagnosis not present

## 2014-12-03 DIAGNOSIS — E119 Type 2 diabetes mellitus without complications: Secondary | ICD-10-CM | POA: Diagnosis not present

## 2015-01-07 DIAGNOSIS — E780 Pure hypercholesterolemia: Secondary | ICD-10-CM | POA: Diagnosis not present

## 2015-01-07 DIAGNOSIS — R109 Unspecified abdominal pain: Secondary | ICD-10-CM | POA: Diagnosis not present

## 2015-01-07 DIAGNOSIS — Z79899 Other long term (current) drug therapy: Secondary | ICD-10-CM | POA: Diagnosis not present

## 2015-01-07 DIAGNOSIS — N390 Urinary tract infection, site not specified: Secondary | ICD-10-CM | POA: Diagnosis not present

## 2015-01-07 DIAGNOSIS — I10 Essential (primary) hypertension: Secondary | ICD-10-CM | POA: Diagnosis not present

## 2015-01-07 DIAGNOSIS — R1032 Left lower quadrant pain: Secondary | ICD-10-CM | POA: Diagnosis not present

## 2015-01-07 DIAGNOSIS — Z7902 Long term (current) use of antithrombotics/antiplatelets: Secondary | ICD-10-CM | POA: Diagnosis not present

## 2015-01-07 DIAGNOSIS — Z8249 Family history of ischemic heart disease and other diseases of the circulatory system: Secondary | ICD-10-CM | POA: Diagnosis not present

## 2015-01-07 DIAGNOSIS — E119 Type 2 diabetes mellitus without complications: Secondary | ICD-10-CM | POA: Diagnosis not present

## 2015-01-12 DIAGNOSIS — N814 Uterovaginal prolapse, unspecified: Secondary | ICD-10-CM | POA: Diagnosis not present

## 2015-02-02 DIAGNOSIS — S3992XA Unspecified injury of lower back, initial encounter: Secondary | ICD-10-CM | POA: Diagnosis not present

## 2015-02-02 DIAGNOSIS — M549 Dorsalgia, unspecified: Secondary | ICD-10-CM | POA: Diagnosis not present

## 2015-02-06 DIAGNOSIS — M549 Dorsalgia, unspecified: Secondary | ICD-10-CM | POA: Diagnosis not present

## 2015-02-06 DIAGNOSIS — G8929 Other chronic pain: Secondary | ICD-10-CM | POA: Diagnosis not present

## 2015-02-09 DIAGNOSIS — M545 Low back pain: Secondary | ICD-10-CM | POA: Diagnosis not present

## 2015-02-09 DIAGNOSIS — M4316 Spondylolisthesis, lumbar region: Secondary | ICD-10-CM | POA: Diagnosis not present

## 2015-02-09 DIAGNOSIS — M47816 Spondylosis without myelopathy or radiculopathy, lumbar region: Secondary | ICD-10-CM | POA: Diagnosis not present

## 2015-02-09 DIAGNOSIS — M5136 Other intervertebral disc degeneration, lumbar region: Secondary | ICD-10-CM | POA: Diagnosis not present

## 2015-02-22 DIAGNOSIS — M545 Low back pain: Secondary | ICD-10-CM | POA: Diagnosis not present

## 2015-02-24 DIAGNOSIS — M545 Low back pain: Secondary | ICD-10-CM | POA: Diagnosis not present

## 2015-02-27 DIAGNOSIS — M545 Low back pain: Secondary | ICD-10-CM | POA: Diagnosis not present

## 2015-03-01 DIAGNOSIS — M545 Low back pain: Secondary | ICD-10-CM | POA: Diagnosis not present

## 2015-03-02 DIAGNOSIS — I10 Essential (primary) hypertension: Secondary | ICD-10-CM | POA: Diagnosis not present

## 2015-03-02 DIAGNOSIS — E1165 Type 2 diabetes mellitus with hyperglycemia: Secondary | ICD-10-CM | POA: Diagnosis not present

## 2015-03-02 DIAGNOSIS — E1121 Type 2 diabetes mellitus with diabetic nephropathy: Secondary | ICD-10-CM | POA: Diagnosis not present

## 2015-03-02 DIAGNOSIS — M545 Low back pain: Secondary | ICD-10-CM | POA: Diagnosis not present

## 2015-03-06 DIAGNOSIS — Z6831 Body mass index (BMI) 31.0-31.9, adult: Secondary | ICD-10-CM | POA: Diagnosis not present

## 2015-03-06 DIAGNOSIS — M549 Dorsalgia, unspecified: Secondary | ICD-10-CM | POA: Diagnosis not present

## 2015-03-06 DIAGNOSIS — M4317 Spondylolisthesis, lumbosacral region: Secondary | ICD-10-CM | POA: Diagnosis not present

## 2015-03-06 DIAGNOSIS — I10 Essential (primary) hypertension: Secondary | ICD-10-CM | POA: Diagnosis not present

## 2015-03-08 DIAGNOSIS — M545 Low back pain: Secondary | ICD-10-CM | POA: Diagnosis not present

## 2015-03-15 DIAGNOSIS — M47816 Spondylosis without myelopathy or radiculopathy, lumbar region: Secondary | ICD-10-CM | POA: Diagnosis not present

## 2015-03-15 DIAGNOSIS — M545 Low back pain: Secondary | ICD-10-CM | POA: Diagnosis not present

## 2015-03-15 DIAGNOSIS — M4316 Spondylolisthesis, lumbar region: Secondary | ICD-10-CM | POA: Diagnosis not present

## 2015-03-15 DIAGNOSIS — M5136 Other intervertebral disc degeneration, lumbar region: Secondary | ICD-10-CM | POA: Diagnosis not present

## 2015-03-16 DIAGNOSIS — M545 Low back pain: Secondary | ICD-10-CM | POA: Diagnosis not present

## 2015-03-17 DIAGNOSIS — M545 Low back pain: Secondary | ICD-10-CM | POA: Diagnosis not present

## 2015-03-20 DIAGNOSIS — M545 Low back pain: Secondary | ICD-10-CM | POA: Diagnosis not present

## 2015-03-23 DIAGNOSIS — M545 Low back pain: Secondary | ICD-10-CM | POA: Diagnosis not present

## 2015-03-28 DIAGNOSIS — M545 Low back pain: Secondary | ICD-10-CM | POA: Diagnosis not present

## 2015-03-28 DIAGNOSIS — Z23 Encounter for immunization: Secondary | ICD-10-CM | POA: Diagnosis not present

## 2015-03-30 DIAGNOSIS — M545 Low back pain: Secondary | ICD-10-CM | POA: Diagnosis not present

## 2015-04-04 DIAGNOSIS — M545 Low back pain: Secondary | ICD-10-CM | POA: Diagnosis not present

## 2015-04-06 DIAGNOSIS — M545 Low back pain: Secondary | ICD-10-CM | POA: Diagnosis not present

## 2015-04-07 DIAGNOSIS — M4716 Other spondylosis with myelopathy, lumbar region: Secondary | ICD-10-CM | POA: Diagnosis not present

## 2015-04-07 DIAGNOSIS — M4806 Spinal stenosis, lumbar region: Secondary | ICD-10-CM | POA: Diagnosis not present

## 2015-04-07 DIAGNOSIS — M7138 Other bursal cyst, other site: Secondary | ICD-10-CM | POA: Diagnosis not present

## 2015-04-07 DIAGNOSIS — M859 Disorder of bone density and structure, unspecified: Secondary | ICD-10-CM | POA: Diagnosis not present

## 2015-04-13 DIAGNOSIS — M545 Low back pain: Secondary | ICD-10-CM | POA: Diagnosis not present

## 2015-04-20 DIAGNOSIS — M545 Low back pain: Secondary | ICD-10-CM | POA: Diagnosis not present

## 2015-04-27 DIAGNOSIS — Z7902 Long term (current) use of antithrombotics/antiplatelets: Secondary | ICD-10-CM | POA: Diagnosis not present

## 2015-04-27 DIAGNOSIS — J441 Chronic obstructive pulmonary disease with (acute) exacerbation: Secondary | ICD-10-CM | POA: Diagnosis not present

## 2015-04-27 DIAGNOSIS — R05 Cough: Secondary | ICD-10-CM | POA: Diagnosis not present

## 2015-04-27 DIAGNOSIS — I10 Essential (primary) hypertension: Secondary | ICD-10-CM | POA: Diagnosis not present

## 2015-04-27 DIAGNOSIS — M545 Low back pain: Secondary | ICD-10-CM | POA: Diagnosis not present

## 2015-04-27 DIAGNOSIS — Z79899 Other long term (current) drug therapy: Secondary | ICD-10-CM | POA: Diagnosis not present

## 2015-05-03 DIAGNOSIS — M545 Low back pain: Secondary | ICD-10-CM | POA: Diagnosis not present

## 2015-05-17 DIAGNOSIS — I10 Essential (primary) hypertension: Secondary | ICD-10-CM | POA: Diagnosis not present

## 2015-05-17 DIAGNOSIS — Z79891 Long term (current) use of opiate analgesic: Secondary | ICD-10-CM | POA: Diagnosis not present

## 2015-05-17 DIAGNOSIS — Z780 Asymptomatic menopausal state: Secondary | ICD-10-CM | POA: Diagnosis not present

## 2015-05-17 DIAGNOSIS — M545 Low back pain: Secondary | ICD-10-CM | POA: Diagnosis not present

## 2015-05-17 DIAGNOSIS — Z7984 Long term (current) use of oral hypoglycemic drugs: Secondary | ICD-10-CM | POA: Diagnosis not present

## 2015-05-17 DIAGNOSIS — Z96651 Presence of right artificial knee joint: Secondary | ICD-10-CM | POA: Diagnosis not present

## 2015-05-17 DIAGNOSIS — M549 Dorsalgia, unspecified: Secondary | ICD-10-CM | POA: Diagnosis not present

## 2015-05-17 DIAGNOSIS — R29890 Loss of height: Secondary | ICD-10-CM | POA: Diagnosis not present

## 2015-05-17 DIAGNOSIS — Z79899 Other long term (current) drug therapy: Secondary | ICD-10-CM | POA: Diagnosis not present

## 2015-05-17 DIAGNOSIS — M1990 Unspecified osteoarthritis, unspecified site: Secondary | ICD-10-CM | POA: Diagnosis not present

## 2015-05-17 DIAGNOSIS — Z791 Long term (current) use of non-steroidal anti-inflammatories (NSAID): Secondary | ICD-10-CM | POA: Diagnosis not present

## 2015-05-17 DIAGNOSIS — M8589 Other specified disorders of bone density and structure, multiple sites: Secondary | ICD-10-CM | POA: Diagnosis not present

## 2015-05-31 DIAGNOSIS — M4806 Spinal stenosis, lumbar region: Secondary | ICD-10-CM | POA: Diagnosis not present

## 2015-05-31 DIAGNOSIS — M7138 Other bursal cyst, other site: Secondary | ICD-10-CM | POA: Diagnosis not present

## 2015-05-31 DIAGNOSIS — E1121 Type 2 diabetes mellitus with diabetic nephropathy: Secondary | ICD-10-CM | POA: Diagnosis not present

## 2015-05-31 DIAGNOSIS — M545 Low back pain: Secondary | ICD-10-CM | POA: Diagnosis not present

## 2015-05-31 DIAGNOSIS — M4712 Other spondylosis with myelopathy, cervical region: Secondary | ICD-10-CM | POA: Diagnosis not present

## 2015-05-31 DIAGNOSIS — I10 Essential (primary) hypertension: Secondary | ICD-10-CM | POA: Diagnosis not present

## 2015-05-31 DIAGNOSIS — M4716 Other spondylosis with myelopathy, lumbar region: Secondary | ICD-10-CM | POA: Diagnosis not present

## 2015-06-02 DIAGNOSIS — M4712 Other spondylosis with myelopathy, cervical region: Secondary | ICD-10-CM | POA: Diagnosis not present

## 2015-06-02 DIAGNOSIS — M47812 Spondylosis without myelopathy or radiculopathy, cervical region: Secondary | ICD-10-CM | POA: Diagnosis not present

## 2015-06-09 DIAGNOSIS — M47816 Spondylosis without myelopathy or radiculopathy, lumbar region: Secondary | ICD-10-CM | POA: Diagnosis not present

## 2015-06-09 DIAGNOSIS — M4316 Spondylolisthesis, lumbar region: Secondary | ICD-10-CM | POA: Diagnosis not present

## 2015-06-09 DIAGNOSIS — M5136 Other intervertebral disc degeneration, lumbar region: Secondary | ICD-10-CM | POA: Diagnosis not present

## 2015-06-09 DIAGNOSIS — M545 Low back pain: Secondary | ICD-10-CM | POA: Diagnosis not present

## 2015-06-20 DIAGNOSIS — M4712 Other spondylosis with myelopathy, cervical region: Secondary | ICD-10-CM | POA: Diagnosis not present

## 2015-06-28 DIAGNOSIS — M47816 Spondylosis without myelopathy or radiculopathy, lumbar region: Secondary | ICD-10-CM | POA: Diagnosis not present

## 2015-06-28 DIAGNOSIS — M47812 Spondylosis without myelopathy or radiculopathy, cervical region: Secondary | ICD-10-CM | POA: Diagnosis not present

## 2015-06-28 DIAGNOSIS — M5136 Other intervertebral disc degeneration, lumbar region: Secondary | ICD-10-CM | POA: Diagnosis not present

## 2015-06-28 DIAGNOSIS — M4316 Spondylolisthesis, lumbar region: Secondary | ICD-10-CM | POA: Diagnosis not present

## 2015-07-07 DIAGNOSIS — C44712 Basal cell carcinoma of skin of right lower limb, including hip: Secondary | ICD-10-CM | POA: Diagnosis not present

## 2015-08-01 DIAGNOSIS — M4712 Other spondylosis with myelopathy, cervical region: Secondary | ICD-10-CM | POA: Diagnosis not present

## 2015-08-01 DIAGNOSIS — G9520 Unspecified cord compression: Secondary | ICD-10-CM | POA: Diagnosis not present

## 2015-08-02 DIAGNOSIS — Z96652 Presence of left artificial knee joint: Secondary | ICD-10-CM | POA: Diagnosis not present

## 2015-08-02 DIAGNOSIS — J449 Chronic obstructive pulmonary disease, unspecified: Secondary | ICD-10-CM | POA: Diagnosis not present

## 2015-08-02 DIAGNOSIS — Z800 Family history of malignant neoplasm of digestive organs: Secondary | ICD-10-CM | POA: Diagnosis not present

## 2015-08-02 DIAGNOSIS — D509 Iron deficiency anemia, unspecified: Secondary | ICD-10-CM | POA: Diagnosis not present

## 2015-08-02 DIAGNOSIS — M4802 Spinal stenosis, cervical region: Secondary | ICD-10-CM | POA: Diagnosis not present

## 2015-08-02 DIAGNOSIS — I10 Essential (primary) hypertension: Secondary | ICD-10-CM | POA: Diagnosis not present

## 2015-08-02 DIAGNOSIS — Z9889 Other specified postprocedural states: Secondary | ICD-10-CM | POA: Diagnosis not present

## 2015-08-02 DIAGNOSIS — Z801 Family history of malignant neoplasm of trachea, bronchus and lung: Secondary | ICD-10-CM | POA: Diagnosis not present

## 2015-08-02 DIAGNOSIS — Z780 Asymptomatic menopausal state: Secondary | ICD-10-CM | POA: Diagnosis not present

## 2015-08-02 DIAGNOSIS — Z7902 Long term (current) use of antithrombotics/antiplatelets: Secondary | ICD-10-CM | POA: Diagnosis not present

## 2015-08-02 DIAGNOSIS — Z8249 Family history of ischemic heart disease and other diseases of the circulatory system: Secondary | ICD-10-CM | POA: Diagnosis not present

## 2015-08-02 DIAGNOSIS — E119 Type 2 diabetes mellitus without complications: Secondary | ICD-10-CM | POA: Diagnosis not present

## 2015-08-02 DIAGNOSIS — Z882 Allergy status to sulfonamides status: Secondary | ICD-10-CM | POA: Diagnosis not present

## 2015-08-02 DIAGNOSIS — Z886 Allergy status to analgesic agent status: Secondary | ICD-10-CM | POA: Diagnosis not present

## 2015-08-02 DIAGNOSIS — Z888 Allergy status to other drugs, medicaments and biological substances status: Secondary | ICD-10-CM | POA: Diagnosis not present

## 2015-08-02 DIAGNOSIS — M47812 Spondylosis without myelopathy or radiculopathy, cervical region: Secondary | ICD-10-CM | POA: Diagnosis not present

## 2015-08-02 DIAGNOSIS — E785 Hyperlipidemia, unspecified: Secondary | ICD-10-CM | POA: Diagnosis present

## 2015-08-02 DIAGNOSIS — M4322 Fusion of spine, cervical region: Secondary | ICD-10-CM | POA: Diagnosis not present

## 2015-08-02 DIAGNOSIS — Z7984 Long term (current) use of oral hypoglycemic drugs: Secondary | ICD-10-CM | POA: Diagnosis not present

## 2015-08-02 DIAGNOSIS — Z79891 Long term (current) use of opiate analgesic: Secondary | ICD-10-CM | POA: Diagnosis not present

## 2015-08-02 DIAGNOSIS — M4712 Other spondylosis with myelopathy, cervical region: Secondary | ICD-10-CM | POA: Diagnosis not present

## 2015-08-02 DIAGNOSIS — Z87891 Personal history of nicotine dependence: Secondary | ICD-10-CM | POA: Diagnosis not present

## 2015-08-02 DIAGNOSIS — Z79899 Other long term (current) drug therapy: Secondary | ICD-10-CM | POA: Diagnosis not present

## 2015-08-02 DIAGNOSIS — E876 Hypokalemia: Secondary | ICD-10-CM | POA: Diagnosis not present

## 2015-08-09 DIAGNOSIS — Z981 Arthrodesis status: Secondary | ICD-10-CM | POA: Diagnosis not present

## 2015-08-09 DIAGNOSIS — M4322 Fusion of spine, cervical region: Secondary | ICD-10-CM | POA: Diagnosis not present

## 2015-08-09 DIAGNOSIS — M4712 Other spondylosis with myelopathy, cervical region: Secondary | ICD-10-CM | POA: Diagnosis not present

## 2015-08-09 DIAGNOSIS — M4312 Spondylolisthesis, cervical region: Secondary | ICD-10-CM | POA: Diagnosis not present

## 2015-09-12 DIAGNOSIS — I10 Essential (primary) hypertension: Secondary | ICD-10-CM | POA: Diagnosis not present

## 2015-09-12 DIAGNOSIS — E119 Type 2 diabetes mellitus without complications: Secondary | ICD-10-CM | POA: Diagnosis not present

## 2015-09-12 DIAGNOSIS — M160 Bilateral primary osteoarthritis of hip: Secondary | ICD-10-CM | POA: Diagnosis not present

## 2015-09-12 DIAGNOSIS — N3080 Other cystitis without hematuria: Secondary | ICD-10-CM | POA: Diagnosis not present

## 2015-10-06 DIAGNOSIS — M542 Cervicalgia: Secondary | ICD-10-CM | POA: Diagnosis not present

## 2015-10-06 DIAGNOSIS — S60222A Contusion of left hand, initial encounter: Secondary | ICD-10-CM | POA: Diagnosis not present

## 2015-10-06 DIAGNOSIS — S3992XA Unspecified injury of lower back, initial encounter: Secondary | ICD-10-CM | POA: Diagnosis not present

## 2015-10-06 DIAGNOSIS — S5012XA Contusion of left forearm, initial encounter: Secondary | ICD-10-CM | POA: Diagnosis not present

## 2015-10-06 DIAGNOSIS — S20219A Contusion of unspecified front wall of thorax, initial encounter: Secondary | ICD-10-CM | POA: Diagnosis not present

## 2015-10-06 DIAGNOSIS — R079 Chest pain, unspecified: Secondary | ICD-10-CM | POA: Diagnosis not present

## 2015-10-06 DIAGNOSIS — S6992XA Unspecified injury of left wrist, hand and finger(s), initial encounter: Secondary | ICD-10-CM | POA: Diagnosis not present

## 2015-10-06 DIAGNOSIS — M25562 Pain in left knee: Secondary | ICD-10-CM | POA: Diagnosis not present

## 2015-10-06 DIAGNOSIS — M546 Pain in thoracic spine: Secondary | ICD-10-CM | POA: Diagnosis not present

## 2015-10-06 DIAGNOSIS — S20212A Contusion of left front wall of thorax, initial encounter: Secondary | ICD-10-CM | POA: Diagnosis not present

## 2015-10-06 DIAGNOSIS — W19XXXA Unspecified fall, initial encounter: Secondary | ICD-10-CM | POA: Diagnosis not present

## 2015-10-06 DIAGNOSIS — M545 Low back pain: Secondary | ICD-10-CM | POA: Diagnosis not present

## 2015-10-06 DIAGNOSIS — M79642 Pain in left hand: Secondary | ICD-10-CM | POA: Diagnosis not present

## 2015-10-06 DIAGNOSIS — Z9889 Other specified postprocedural states: Secondary | ICD-10-CM | POA: Diagnosis not present

## 2015-10-06 DIAGNOSIS — S299XXA Unspecified injury of thorax, initial encounter: Secondary | ICD-10-CM | POA: Diagnosis not present

## 2015-10-06 DIAGNOSIS — S8992XA Unspecified injury of left lower leg, initial encounter: Secondary | ICD-10-CM | POA: Diagnosis not present

## 2015-10-07 DIAGNOSIS — S299XXA Unspecified injury of thorax, initial encounter: Secondary | ICD-10-CM | POA: Diagnosis not present

## 2015-10-07 DIAGNOSIS — S6992XA Unspecified injury of left wrist, hand and finger(s), initial encounter: Secondary | ICD-10-CM | POA: Diagnosis not present

## 2015-10-07 DIAGNOSIS — M546 Pain in thoracic spine: Secondary | ICD-10-CM | POA: Diagnosis not present

## 2015-10-07 DIAGNOSIS — S8992XA Unspecified injury of left lower leg, initial encounter: Secondary | ICD-10-CM | POA: Diagnosis not present

## 2015-10-07 DIAGNOSIS — M545 Low back pain: Secondary | ICD-10-CM | POA: Diagnosis not present

## 2015-10-07 DIAGNOSIS — M25562 Pain in left knee: Secondary | ICD-10-CM | POA: Diagnosis not present

## 2015-10-07 DIAGNOSIS — S3992XA Unspecified injury of lower back, initial encounter: Secondary | ICD-10-CM | POA: Diagnosis not present

## 2015-10-07 DIAGNOSIS — R079 Chest pain, unspecified: Secondary | ICD-10-CM | POA: Diagnosis not present

## 2015-10-07 DIAGNOSIS — M79642 Pain in left hand: Secondary | ICD-10-CM | POA: Diagnosis not present

## 2015-11-03 DIAGNOSIS — M4316 Spondylolisthesis, lumbar region: Secondary | ICD-10-CM | POA: Diagnosis not present

## 2015-11-03 DIAGNOSIS — M4806 Spinal stenosis, lumbar region: Secondary | ICD-10-CM | POA: Diagnosis not present

## 2015-11-03 DIAGNOSIS — M4716 Other spondylosis with myelopathy, lumbar region: Secondary | ICD-10-CM | POA: Diagnosis not present

## 2015-11-03 DIAGNOSIS — M4807 Spinal stenosis, lumbosacral region: Secondary | ICD-10-CM | POA: Diagnosis not present

## 2015-11-16 DIAGNOSIS — M4806 Spinal stenosis, lumbar region: Secondary | ICD-10-CM | POA: Diagnosis not present

## 2015-11-16 DIAGNOSIS — M7138 Other bursal cyst, other site: Secondary | ICD-10-CM | POA: Diagnosis not present

## 2015-11-16 DIAGNOSIS — M4716 Other spondylosis with myelopathy, lumbar region: Secondary | ICD-10-CM | POA: Diagnosis not present

## 2015-11-21 DIAGNOSIS — M545 Low back pain: Secondary | ICD-10-CM | POA: Diagnosis not present

## 2015-11-21 DIAGNOSIS — M4806 Spinal stenosis, lumbar region: Secondary | ICD-10-CM | POA: Diagnosis not present

## 2015-11-21 DIAGNOSIS — R262 Difficulty in walking, not elsewhere classified: Secondary | ICD-10-CM | POA: Diagnosis not present

## 2015-11-23 DIAGNOSIS — I872 Venous insufficiency (chronic) (peripheral): Secondary | ICD-10-CM | POA: Diagnosis not present

## 2015-11-23 DIAGNOSIS — E119 Type 2 diabetes mellitus without complications: Secondary | ICD-10-CM | POA: Diagnosis not present

## 2015-11-23 DIAGNOSIS — R262 Difficulty in walking, not elsewhere classified: Secondary | ICD-10-CM | POA: Diagnosis not present

## 2015-11-23 DIAGNOSIS — M545 Low back pain: Secondary | ICD-10-CM | POA: Diagnosis not present

## 2015-11-23 DIAGNOSIS — E784 Other hyperlipidemia: Secondary | ICD-10-CM | POA: Diagnosis not present

## 2015-11-23 DIAGNOSIS — M4806 Spinal stenosis, lumbar region: Secondary | ICD-10-CM | POA: Diagnosis not present

## 2015-11-23 DIAGNOSIS — I10 Essential (primary) hypertension: Secondary | ICD-10-CM | POA: Diagnosis not present

## 2015-11-28 DIAGNOSIS — M4806 Spinal stenosis, lumbar region: Secondary | ICD-10-CM | POA: Diagnosis not present

## 2015-11-28 DIAGNOSIS — R262 Difficulty in walking, not elsewhere classified: Secondary | ICD-10-CM | POA: Diagnosis not present

## 2015-11-28 DIAGNOSIS — M545 Low back pain: Secondary | ICD-10-CM | POA: Diagnosis not present

## 2015-11-30 DIAGNOSIS — M4806 Spinal stenosis, lumbar region: Secondary | ICD-10-CM | POA: Diagnosis not present

## 2015-11-30 DIAGNOSIS — M545 Low back pain: Secondary | ICD-10-CM | POA: Diagnosis not present

## 2015-11-30 DIAGNOSIS — R262 Difficulty in walking, not elsewhere classified: Secondary | ICD-10-CM | POA: Diagnosis not present

## 2015-12-07 DIAGNOSIS — M545 Low back pain: Secondary | ICD-10-CM | POA: Diagnosis not present

## 2015-12-07 DIAGNOSIS — R262 Difficulty in walking, not elsewhere classified: Secondary | ICD-10-CM | POA: Diagnosis not present

## 2015-12-07 DIAGNOSIS — M4806 Spinal stenosis, lumbar region: Secondary | ICD-10-CM | POA: Diagnosis not present

## 2015-12-11 DIAGNOSIS — I872 Venous insufficiency (chronic) (peripheral): Secondary | ICD-10-CM | POA: Diagnosis not present

## 2015-12-11 DIAGNOSIS — E119 Type 2 diabetes mellitus without complications: Secondary | ICD-10-CM | POA: Diagnosis not present

## 2015-12-11 DIAGNOSIS — Z23 Encounter for immunization: Secondary | ICD-10-CM | POA: Diagnosis not present

## 2015-12-11 DIAGNOSIS — I10 Essential (primary) hypertension: Secondary | ICD-10-CM | POA: Diagnosis not present

## 2015-12-13 DIAGNOSIS — M545 Low back pain: Secondary | ICD-10-CM | POA: Diagnosis not present

## 2015-12-13 DIAGNOSIS — M4806 Spinal stenosis, lumbar region: Secondary | ICD-10-CM | POA: Diagnosis not present

## 2015-12-13 DIAGNOSIS — R262 Difficulty in walking, not elsewhere classified: Secondary | ICD-10-CM | POA: Diagnosis not present

## 2015-12-21 DIAGNOSIS — I10 Essential (primary) hypertension: Secondary | ICD-10-CM | POA: Diagnosis not present

## 2015-12-21 DIAGNOSIS — E119 Type 2 diabetes mellitus without complications: Secondary | ICD-10-CM | POA: Diagnosis not present

## 2016-01-01 DIAGNOSIS — E784 Other hyperlipidemia: Secondary | ICD-10-CM | POA: Diagnosis not present

## 2016-01-01 DIAGNOSIS — E119 Type 2 diabetes mellitus without complications: Secondary | ICD-10-CM | POA: Diagnosis not present

## 2016-01-01 DIAGNOSIS — I10 Essential (primary) hypertension: Secondary | ICD-10-CM | POA: Diagnosis not present

## 2016-01-01 DIAGNOSIS — I872 Venous insufficiency (chronic) (peripheral): Secondary | ICD-10-CM | POA: Diagnosis not present

## 2016-01-04 DIAGNOSIS — Z96652 Presence of left artificial knee joint: Secondary | ICD-10-CM | POA: Diagnosis not present

## 2016-01-04 DIAGNOSIS — M25562 Pain in left knee: Secondary | ICD-10-CM | POA: Diagnosis not present

## 2016-01-05 DIAGNOSIS — M4716 Other spondylosis with myelopathy, lumbar region: Secondary | ICD-10-CM | POA: Diagnosis not present

## 2016-01-05 DIAGNOSIS — M4806 Spinal stenosis, lumbar region: Secondary | ICD-10-CM | POA: Diagnosis not present

## 2016-01-05 DIAGNOSIS — M7138 Other bursal cyst, other site: Secondary | ICD-10-CM | POA: Diagnosis not present

## 2016-01-15 DIAGNOSIS — I10 Essential (primary) hypertension: Secondary | ICD-10-CM | POA: Diagnosis not present

## 2016-01-15 DIAGNOSIS — E119 Type 2 diabetes mellitus without complications: Secondary | ICD-10-CM | POA: Diagnosis not present

## 2016-01-15 DIAGNOSIS — I872 Venous insufficiency (chronic) (peripheral): Secondary | ICD-10-CM | POA: Diagnosis not present

## 2016-01-15 DIAGNOSIS — E784 Other hyperlipidemia: Secondary | ICD-10-CM | POA: Diagnosis not present

## 2016-01-19 DIAGNOSIS — Z471 Aftercare following joint replacement surgery: Secondary | ICD-10-CM | POA: Diagnosis not present

## 2016-01-19 DIAGNOSIS — M25562 Pain in left knee: Secondary | ICD-10-CM | POA: Diagnosis not present

## 2016-01-19 DIAGNOSIS — Z96652 Presence of left artificial knee joint: Secondary | ICD-10-CM | POA: Diagnosis not present

## 2016-01-23 ENCOUNTER — Other Ambulatory Visit (HOSPITAL_COMMUNITY): Payer: Self-pay | Admitting: Orthopedic Surgery

## 2016-01-23 DIAGNOSIS — M25562 Pain in left knee: Secondary | ICD-10-CM

## 2016-01-25 DIAGNOSIS — C44712 Basal cell carcinoma of skin of right lower limb, including hip: Secondary | ICD-10-CM | POA: Diagnosis not present

## 2016-02-05 ENCOUNTER — Encounter (HOSPITAL_COMMUNITY)
Admission: RE | Admit: 2016-02-05 | Discharge: 2016-02-05 | Disposition: A | Discharge status: Home / Self Care | Payer: Medicare Other | Source: Ambulatory Visit | Attending: Orthopedic Surgery | Admitting: Orthopedic Surgery

## 2016-02-05 DIAGNOSIS — M25562 Pain in left knee: Secondary | ICD-10-CM | POA: Diagnosis not present

## 2016-02-05 MED ORDER — TECHNETIUM TC 99M MEDRONATE IV KIT
25.0000 | PACK | Freq: Once | INTRAVENOUS | Status: AC | PRN
  Administered 2016-02-05: 20.4 via INTRAVENOUS

## 2016-02-07 DIAGNOSIS — N814 Uterovaginal prolapse, unspecified: Secondary | ICD-10-CM | POA: Diagnosis not present

## 2016-02-26 DIAGNOSIS — C44712 Basal cell carcinoma of skin of right lower limb, including hip: Secondary | ICD-10-CM | POA: Diagnosis not present

## 2016-02-26 DIAGNOSIS — Z23 Encounter for immunization: Secondary | ICD-10-CM | POA: Diagnosis not present

## 2016-02-27 DIAGNOSIS — C44712 Basal cell carcinoma of skin of right lower limb, including hip: Secondary | ICD-10-CM | POA: Diagnosis not present

## 2016-02-27 DIAGNOSIS — C44729 Squamous cell carcinoma of skin of left lower limb, including hip: Secondary | ICD-10-CM | POA: Diagnosis not present

## 2016-03-04 DIAGNOSIS — N10 Acute tubulo-interstitial nephritis: Secondary | ICD-10-CM | POA: Diagnosis not present

## 2016-03-04 DIAGNOSIS — R4182 Altered mental status, unspecified: Secondary | ICD-10-CM | POA: Diagnosis not present

## 2016-03-04 DIAGNOSIS — M4800 Spinal stenosis, site unspecified: Secondary | ICD-10-CM | POA: Diagnosis not present

## 2016-03-04 DIAGNOSIS — B9620 Unspecified Escherichia coli [E. coli] as the cause of diseases classified elsewhere: Secondary | ICD-10-CM | POA: Diagnosis not present

## 2016-03-04 DIAGNOSIS — N12 Tubulo-interstitial nephritis, not specified as acute or chronic: Secondary | ICD-10-CM | POA: Diagnosis not present

## 2016-03-04 DIAGNOSIS — R509 Fever, unspecified: Secondary | ICD-10-CM | POA: Diagnosis not present

## 2016-03-04 DIAGNOSIS — A419 Sepsis, unspecified organism: Secondary | ICD-10-CM | POA: Diagnosis not present

## 2016-03-04 DIAGNOSIS — E1151 Type 2 diabetes mellitus with diabetic peripheral angiopathy without gangrene: Secondary | ICD-10-CM | POA: Diagnosis not present

## 2016-03-04 DIAGNOSIS — M170 Bilateral primary osteoarthritis of knee: Secondary | ICD-10-CM | POA: Diagnosis not present

## 2016-03-04 DIAGNOSIS — I10 Essential (primary) hypertension: Secondary | ICD-10-CM | POA: Diagnosis not present

## 2016-03-05 DIAGNOSIS — Z886 Allergy status to analgesic agent status: Secondary | ICD-10-CM | POA: Diagnosis not present

## 2016-03-05 DIAGNOSIS — B9629 Other Escherichia coli [E. coli] as the cause of diseases classified elsewhere: Secondary | ICD-10-CM | POA: Diagnosis not present

## 2016-03-05 DIAGNOSIS — I10 Essential (primary) hypertension: Secondary | ICD-10-CM | POA: Diagnosis not present

## 2016-03-05 DIAGNOSIS — E1151 Type 2 diabetes mellitus with diabetic peripheral angiopathy without gangrene: Secondary | ICD-10-CM | POA: Diagnosis not present

## 2016-03-05 DIAGNOSIS — Z800 Family history of malignant neoplasm of digestive organs: Secondary | ICD-10-CM | POA: Diagnosis not present

## 2016-03-05 DIAGNOSIS — Z981 Arthrodesis status: Secondary | ICD-10-CM | POA: Diagnosis not present

## 2016-03-05 DIAGNOSIS — M4802 Spinal stenosis, cervical region: Secondary | ICD-10-CM | POA: Diagnosis not present

## 2016-03-05 DIAGNOSIS — Z888 Allergy status to other drugs, medicaments and biological substances status: Secondary | ICD-10-CM | POA: Diagnosis not present

## 2016-03-05 DIAGNOSIS — Z7984 Long term (current) use of oral hypoglycemic drugs: Secondary | ICD-10-CM | POA: Diagnosis not present

## 2016-03-05 DIAGNOSIS — Z780 Asymptomatic menopausal state: Secondary | ICD-10-CM | POA: Diagnosis not present

## 2016-03-05 DIAGNOSIS — M170 Bilateral primary osteoarthritis of knee: Secondary | ICD-10-CM | POA: Diagnosis present

## 2016-03-05 DIAGNOSIS — Z801 Family history of malignant neoplasm of trachea, bronchus and lung: Secondary | ICD-10-CM | POA: Diagnosis not present

## 2016-03-05 DIAGNOSIS — Z87891 Personal history of nicotine dependence: Secondary | ICD-10-CM | POA: Diagnosis not present

## 2016-03-05 DIAGNOSIS — Z79891 Long term (current) use of opiate analgesic: Secondary | ICD-10-CM | POA: Diagnosis not present

## 2016-03-05 DIAGNOSIS — M4800 Spinal stenosis, site unspecified: Secondary | ICD-10-CM | POA: Diagnosis present

## 2016-03-05 DIAGNOSIS — Z880 Allergy status to penicillin: Secondary | ICD-10-CM | POA: Diagnosis not present

## 2016-03-05 DIAGNOSIS — Z881 Allergy status to other antibiotic agents status: Secondary | ICD-10-CM | POA: Diagnosis not present

## 2016-03-05 DIAGNOSIS — Z79899 Other long term (current) drug therapy: Secondary | ICD-10-CM | POA: Diagnosis not present

## 2016-03-05 DIAGNOSIS — B9620 Unspecified Escherichia coli [E. coli] as the cause of diseases classified elsewhere: Secondary | ICD-10-CM | POA: Diagnosis present

## 2016-03-05 DIAGNOSIS — Z8249 Family history of ischemic heart disease and other diseases of the circulatory system: Secondary | ICD-10-CM | POA: Diagnosis not present

## 2016-03-05 DIAGNOSIS — N10 Acute tubulo-interstitial nephritis: Secondary | ICD-10-CM | POA: Diagnosis not present

## 2016-03-12 DIAGNOSIS — N3000 Acute cystitis without hematuria: Secondary | ICD-10-CM | POA: Diagnosis not present

## 2016-03-12 DIAGNOSIS — Z0000 Encounter for general adult medical examination without abnormal findings: Secondary | ICD-10-CM | POA: Diagnosis not present

## 2016-03-12 DIAGNOSIS — Z1389 Encounter for screening for other disorder: Secondary | ICD-10-CM | POA: Diagnosis not present

## 2016-03-12 DIAGNOSIS — E119 Type 2 diabetes mellitus without complications: Secondary | ICD-10-CM | POA: Diagnosis not present

## 2016-03-12 DIAGNOSIS — I10 Essential (primary) hypertension: Secondary | ICD-10-CM | POA: Diagnosis not present

## 2016-03-14 DIAGNOSIS — M4807 Spinal stenosis, lumbosacral region: Secondary | ICD-10-CM | POA: Diagnosis not present

## 2016-03-19 DIAGNOSIS — Z1231 Encounter for screening mammogram for malignant neoplasm of breast: Secondary | ICD-10-CM | POA: Diagnosis not present

## 2016-03-19 DIAGNOSIS — Z9882 Breast implant status: Secondary | ICD-10-CM | POA: Diagnosis not present

## 2016-03-28 DIAGNOSIS — E119 Type 2 diabetes mellitus without complications: Secondary | ICD-10-CM | POA: Diagnosis not present

## 2016-03-28 DIAGNOSIS — E784 Other hyperlipidemia: Secondary | ICD-10-CM | POA: Diagnosis not present

## 2016-03-28 DIAGNOSIS — I872 Venous insufficiency (chronic) (peripheral): Secondary | ICD-10-CM | POA: Diagnosis not present

## 2016-03-28 DIAGNOSIS — I10 Essential (primary) hypertension: Secondary | ICD-10-CM | POA: Diagnosis not present

## 2016-04-30 DIAGNOSIS — E119 Type 2 diabetes mellitus without complications: Secondary | ICD-10-CM | POA: Diagnosis not present

## 2016-04-30 DIAGNOSIS — E784 Other hyperlipidemia: Secondary | ICD-10-CM | POA: Diagnosis not present

## 2016-04-30 DIAGNOSIS — I872 Venous insufficiency (chronic) (peripheral): Secondary | ICD-10-CM | POA: Diagnosis not present

## 2016-04-30 DIAGNOSIS — I10 Essential (primary) hypertension: Secondary | ICD-10-CM | POA: Diagnosis not present

## 2016-05-10 DIAGNOSIS — M7138 Other bursal cyst, other site: Secondary | ICD-10-CM | POA: Diagnosis not present

## 2016-05-10 DIAGNOSIS — M4716 Other spondylosis with myelopathy, lumbar region: Secondary | ICD-10-CM | POA: Diagnosis not present

## 2016-05-20 DIAGNOSIS — E119 Type 2 diabetes mellitus without complications: Secondary | ICD-10-CM | POA: Diagnosis not present

## 2016-05-20 DIAGNOSIS — I10 Essential (primary) hypertension: Secondary | ICD-10-CM | POA: Diagnosis not present

## 2016-05-20 DIAGNOSIS — I872 Venous insufficiency (chronic) (peripheral): Secondary | ICD-10-CM | POA: Diagnosis not present

## 2016-05-20 DIAGNOSIS — E784 Other hyperlipidemia: Secondary | ICD-10-CM | POA: Diagnosis not present

## 2016-05-29 DIAGNOSIS — I10 Essential (primary) hypertension: Secondary | ICD-10-CM | POA: Diagnosis not present

## 2016-05-29 DIAGNOSIS — Z6831 Body mass index (BMI) 31.0-31.9, adult: Secondary | ICD-10-CM | POA: Diagnosis not present

## 2016-05-29 DIAGNOSIS — R3915 Urgency of urination: Secondary | ICD-10-CM | POA: Diagnosis not present

## 2016-06-19 DIAGNOSIS — Z6831 Body mass index (BMI) 31.0-31.9, adult: Secondary | ICD-10-CM | POA: Diagnosis not present

## 2016-06-19 DIAGNOSIS — N814 Uterovaginal prolapse, unspecified: Secondary | ICD-10-CM | POA: Diagnosis not present

## 2016-06-19 DIAGNOSIS — R3915 Urgency of urination: Secondary | ICD-10-CM | POA: Diagnosis not present

## 2016-06-25 DIAGNOSIS — E119 Type 2 diabetes mellitus without complications: Secondary | ICD-10-CM | POA: Diagnosis not present

## 2016-06-25 DIAGNOSIS — E784 Other hyperlipidemia: Secondary | ICD-10-CM | POA: Diagnosis not present

## 2016-06-25 DIAGNOSIS — I872 Venous insufficiency (chronic) (peripheral): Secondary | ICD-10-CM | POA: Diagnosis not present

## 2016-06-25 DIAGNOSIS — I10 Essential (primary) hypertension: Secondary | ICD-10-CM | POA: Diagnosis not present

## 2016-07-10 DIAGNOSIS — R3915 Urgency of urination: Secondary | ICD-10-CM | POA: Diagnosis not present

## 2016-07-10 DIAGNOSIS — Z4689 Encounter for fitting and adjustment of other specified devices: Secondary | ICD-10-CM | POA: Diagnosis not present

## 2016-07-10 DIAGNOSIS — Z6831 Body mass index (BMI) 31.0-31.9, adult: Secondary | ICD-10-CM | POA: Diagnosis not present

## 2016-07-11 DIAGNOSIS — I10 Essential (primary) hypertension: Secondary | ICD-10-CM | POA: Diagnosis not present

## 2016-07-11 DIAGNOSIS — M545 Low back pain: Secondary | ICD-10-CM | POA: Diagnosis not present

## 2016-07-11 DIAGNOSIS — I872 Venous insufficiency (chronic) (peripheral): Secondary | ICD-10-CM | POA: Diagnosis not present

## 2016-07-11 DIAGNOSIS — E119 Type 2 diabetes mellitus without complications: Secondary | ICD-10-CM | POA: Diagnosis not present

## 2016-07-12 DIAGNOSIS — Z6831 Body mass index (BMI) 31.0-31.9, adult: Secondary | ICD-10-CM | POA: Diagnosis not present

## 2016-07-12 DIAGNOSIS — N814 Uterovaginal prolapse, unspecified: Secondary | ICD-10-CM | POA: Diagnosis not present

## 2016-07-12 DIAGNOSIS — Z4689 Encounter for fitting and adjustment of other specified devices: Secondary | ICD-10-CM | POA: Diagnosis not present

## 2016-07-18 DIAGNOSIS — Z6831 Body mass index (BMI) 31.0-31.9, adult: Secondary | ICD-10-CM | POA: Diagnosis not present

## 2016-07-18 DIAGNOSIS — N814 Uterovaginal prolapse, unspecified: Secondary | ICD-10-CM | POA: Diagnosis not present

## 2016-07-22 DIAGNOSIS — I10 Essential (primary) hypertension: Secondary | ICD-10-CM | POA: Diagnosis not present

## 2016-07-22 DIAGNOSIS — E119 Type 2 diabetes mellitus without complications: Secondary | ICD-10-CM | POA: Diagnosis not present

## 2016-07-22 DIAGNOSIS — I872 Venous insufficiency (chronic) (peripheral): Secondary | ICD-10-CM | POA: Diagnosis not present

## 2016-07-22 DIAGNOSIS — E784 Other hyperlipidemia: Secondary | ICD-10-CM | POA: Diagnosis not present

## 2016-08-15 DIAGNOSIS — M7138 Other bursal cyst, other site: Secondary | ICD-10-CM | POA: Diagnosis not present

## 2016-08-15 DIAGNOSIS — M4716 Other spondylosis with myelopathy, lumbar region: Secondary | ICD-10-CM | POA: Diagnosis not present

## 2016-08-22 DIAGNOSIS — X58XXXA Exposure to other specified factors, initial encounter: Secondary | ICD-10-CM | POA: Diagnosis not present

## 2016-08-22 DIAGNOSIS — Z79899 Other long term (current) drug therapy: Secondary | ICD-10-CM | POA: Diagnosis not present

## 2016-08-22 DIAGNOSIS — M79671 Pain in right foot: Secondary | ICD-10-CM | POA: Diagnosis not present

## 2016-08-22 DIAGNOSIS — I10 Essential (primary) hypertension: Secondary | ICD-10-CM | POA: Diagnosis not present

## 2016-08-22 DIAGNOSIS — E119 Type 2 diabetes mellitus without complications: Secondary | ICD-10-CM | POA: Diagnosis not present

## 2016-08-22 DIAGNOSIS — M4690 Unspecified inflammatory spondylopathy, site unspecified: Secondary | ICD-10-CM | POA: Diagnosis not present

## 2016-08-22 DIAGNOSIS — E7800 Pure hypercholesterolemia, unspecified: Secondary | ICD-10-CM | POA: Diagnosis not present

## 2016-08-22 DIAGNOSIS — S93601A Unspecified sprain of right foot, initial encounter: Secondary | ICD-10-CM | POA: Diagnosis not present

## 2016-08-22 DIAGNOSIS — Z7984 Long term (current) use of oral hypoglycemic drugs: Secondary | ICD-10-CM | POA: Diagnosis not present

## 2016-08-22 DIAGNOSIS — Z8249 Family history of ischemic heart disease and other diseases of the circulatory system: Secondary | ICD-10-CM | POA: Diagnosis not present

## 2016-08-22 DIAGNOSIS — J449 Chronic obstructive pulmonary disease, unspecified: Secondary | ICD-10-CM | POA: Diagnosis not present

## 2016-08-22 DIAGNOSIS — Z8673 Personal history of transient ischemic attack (TIA), and cerebral infarction without residual deficits: Secondary | ICD-10-CM | POA: Diagnosis not present

## 2016-09-03 DIAGNOSIS — M19012 Primary osteoarthritis, left shoulder: Secondary | ICD-10-CM | POA: Diagnosis not present

## 2016-09-03 DIAGNOSIS — M19011 Primary osteoarthritis, right shoulder: Secondary | ICD-10-CM | POA: Diagnosis not present

## 2016-09-05 DIAGNOSIS — I872 Venous insufficiency (chronic) (peripheral): Secondary | ICD-10-CM | POA: Diagnosis not present

## 2016-09-05 DIAGNOSIS — M07671 Enteropathic arthropathies, right ankle and foot: Secondary | ICD-10-CM | POA: Diagnosis not present

## 2016-09-30 DIAGNOSIS — I872 Venous insufficiency (chronic) (peripheral): Secondary | ICD-10-CM | POA: Diagnosis not present

## 2016-09-30 DIAGNOSIS — E119 Type 2 diabetes mellitus without complications: Secondary | ICD-10-CM | POA: Diagnosis not present

## 2016-09-30 DIAGNOSIS — I10 Essential (primary) hypertension: Secondary | ICD-10-CM | POA: Diagnosis not present

## 2016-09-30 DIAGNOSIS — E784 Other hyperlipidemia: Secondary | ICD-10-CM | POA: Diagnosis not present

## 2016-11-25 DIAGNOSIS — M4716 Other spondylosis with myelopathy, lumbar region: Secondary | ICD-10-CM | POA: Diagnosis not present

## 2016-11-25 DIAGNOSIS — I872 Venous insufficiency (chronic) (peripheral): Secondary | ICD-10-CM | POA: Diagnosis not present

## 2016-12-09 DIAGNOSIS — E119 Type 2 diabetes mellitus without complications: Secondary | ICD-10-CM | POA: Diagnosis not present

## 2016-12-09 DIAGNOSIS — I7389 Other specified peripheral vascular diseases: Secondary | ICD-10-CM | POA: Diagnosis not present

## 2016-12-09 DIAGNOSIS — I872 Venous insufficiency (chronic) (peripheral): Secondary | ICD-10-CM | POA: Diagnosis not present

## 2016-12-09 DIAGNOSIS — E784 Other hyperlipidemia: Secondary | ICD-10-CM | POA: Diagnosis not present

## 2016-12-10 DIAGNOSIS — I10 Essential (primary) hypertension: Secondary | ICD-10-CM | POA: Diagnosis not present

## 2016-12-10 DIAGNOSIS — E784 Other hyperlipidemia: Secondary | ICD-10-CM | POA: Diagnosis not present

## 2016-12-10 DIAGNOSIS — E119 Type 2 diabetes mellitus without complications: Secondary | ICD-10-CM | POA: Diagnosis not present

## 2016-12-12 DIAGNOSIS — M5124 Other intervertebral disc displacement, thoracic region: Secondary | ICD-10-CM | POA: Diagnosis not present

## 2016-12-12 DIAGNOSIS — M9983 Other biomechanical lesions of lumbar region: Secondary | ICD-10-CM | POA: Diagnosis not present

## 2016-12-12 DIAGNOSIS — M47816 Spondylosis without myelopathy or radiculopathy, lumbar region: Secondary | ICD-10-CM | POA: Diagnosis not present

## 2016-12-12 DIAGNOSIS — M47815 Spondylosis without myelopathy or radiculopathy, thoracolumbar region: Secondary | ICD-10-CM | POA: Diagnosis not present

## 2016-12-12 DIAGNOSIS — M5126 Other intervertebral disc displacement, lumbar region: Secondary | ICD-10-CM | POA: Diagnosis not present

## 2016-12-12 DIAGNOSIS — M48061 Spinal stenosis, lumbar region without neurogenic claudication: Secondary | ICD-10-CM | POA: Diagnosis not present

## 2017-01-01 DIAGNOSIS — M4716 Other spondylosis with myelopathy, lumbar region: Secondary | ICD-10-CM | POA: Diagnosis not present

## 2017-01-01 DIAGNOSIS — M7138 Other bursal cyst, other site: Secondary | ICD-10-CM | POA: Diagnosis not present

## 2017-01-09 DIAGNOSIS — E784 Other hyperlipidemia: Secondary | ICD-10-CM | POA: Diagnosis not present

## 2017-01-09 DIAGNOSIS — I10 Essential (primary) hypertension: Secondary | ICD-10-CM | POA: Diagnosis not present

## 2017-01-09 DIAGNOSIS — E119 Type 2 diabetes mellitus without complications: Secondary | ICD-10-CM | POA: Diagnosis not present

## 2017-02-06 DIAGNOSIS — I779 Disorder of arteries and arterioles, unspecified: Secondary | ICD-10-CM | POA: Diagnosis not present

## 2017-02-06 DIAGNOSIS — M7138 Other bursal cyst, other site: Secondary | ICD-10-CM | POA: Diagnosis not present

## 2017-02-06 DIAGNOSIS — Z6830 Body mass index (BMI) 30.0-30.9, adult: Secondary | ICD-10-CM | POA: Diagnosis not present

## 2017-02-13 DIAGNOSIS — I779 Disorder of arteries and arterioles, unspecified: Secondary | ICD-10-CM | POA: Diagnosis not present

## 2017-02-13 DIAGNOSIS — M7138 Other bursal cyst, other site: Secondary | ICD-10-CM | POA: Diagnosis not present

## 2017-02-13 DIAGNOSIS — Z6830 Body mass index (BMI) 30.0-30.9, adult: Secondary | ICD-10-CM | POA: Diagnosis not present

## 2017-02-13 DIAGNOSIS — M47816 Spondylosis without myelopathy or radiculopathy, lumbar region: Secondary | ICD-10-CM | POA: Diagnosis not present

## 2017-02-13 DIAGNOSIS — I6523 Occlusion and stenosis of bilateral carotid arteries: Secondary | ICD-10-CM | POA: Diagnosis not present

## 2017-02-14 DIAGNOSIS — E784 Other hyperlipidemia: Secondary | ICD-10-CM | POA: Diagnosis not present

## 2017-02-14 DIAGNOSIS — E119 Type 2 diabetes mellitus without complications: Secondary | ICD-10-CM | POA: Diagnosis not present

## 2017-02-14 DIAGNOSIS — I10 Essential (primary) hypertension: Secondary | ICD-10-CM | POA: Diagnosis not present

## 2017-03-08 DIAGNOSIS — Z23 Encounter for immunization: Secondary | ICD-10-CM | POA: Diagnosis not present

## 2017-03-18 DIAGNOSIS — I872 Venous insufficiency (chronic) (peripheral): Secondary | ICD-10-CM | POA: Diagnosis not present

## 2017-03-18 DIAGNOSIS — I10 Essential (primary) hypertension: Secondary | ICD-10-CM | POA: Diagnosis not present

## 2017-03-18 DIAGNOSIS — E7849 Other hyperlipidemia: Secondary | ICD-10-CM | POA: Diagnosis not present

## 2017-03-18 DIAGNOSIS — E119 Type 2 diabetes mellitus without complications: Secondary | ICD-10-CM | POA: Diagnosis not present

## 2017-03-18 DIAGNOSIS — Z1389 Encounter for screening for other disorder: Secondary | ICD-10-CM | POA: Diagnosis not present

## 2017-03-18 DIAGNOSIS — I7389 Other specified peripheral vascular diseases: Secondary | ICD-10-CM | POA: Diagnosis not present

## 2017-03-18 DIAGNOSIS — Z0000 Encounter for general adult medical examination without abnormal findings: Secondary | ICD-10-CM | POA: Diagnosis not present

## 2017-03-20 DIAGNOSIS — C44722 Squamous cell carcinoma of skin of right lower limb, including hip: Secondary | ICD-10-CM | POA: Diagnosis not present

## 2017-03-27 DIAGNOSIS — C44722 Squamous cell carcinoma of skin of right lower limb, including hip: Secondary | ICD-10-CM | POA: Diagnosis not present

## 2017-03-29 DIAGNOSIS — Z8249 Family history of ischemic heart disease and other diseases of the circulatory system: Secondary | ICD-10-CM | POA: Diagnosis not present

## 2017-03-29 DIAGNOSIS — Z79899 Other long term (current) drug therapy: Secondary | ICD-10-CM | POA: Diagnosis not present

## 2017-03-29 DIAGNOSIS — Z7984 Long term (current) use of oral hypoglycemic drugs: Secondary | ICD-10-CM | POA: Diagnosis not present

## 2017-03-29 DIAGNOSIS — E119 Type 2 diabetes mellitus without complications: Secondary | ICD-10-CM | POA: Diagnosis not present

## 2017-03-29 DIAGNOSIS — J449 Chronic obstructive pulmonary disease, unspecified: Secondary | ICD-10-CM | POA: Diagnosis not present

## 2017-03-29 DIAGNOSIS — I10 Essential (primary) hypertension: Secondary | ICD-10-CM | POA: Diagnosis not present

## 2017-03-29 DIAGNOSIS — E7800 Pure hypercholesterolemia, unspecified: Secondary | ICD-10-CM | POA: Diagnosis not present

## 2017-03-29 DIAGNOSIS — M1990 Unspecified osteoarthritis, unspecified site: Secondary | ICD-10-CM | POA: Diagnosis not present

## 2017-03-29 DIAGNOSIS — S161XXA Strain of muscle, fascia and tendon at neck level, initial encounter: Secondary | ICD-10-CM | POA: Diagnosis not present

## 2017-03-31 DIAGNOSIS — M7138 Other bursal cyst, other site: Secondary | ICD-10-CM | POA: Diagnosis not present

## 2017-03-31 DIAGNOSIS — Z538 Procedure and treatment not carried out for other reasons: Secondary | ICD-10-CM | POA: Diagnosis not present

## 2017-03-31 DIAGNOSIS — R079 Chest pain, unspecified: Secondary | ICD-10-CM | POA: Diagnosis not present

## 2017-04-03 DIAGNOSIS — C44722 Squamous cell carcinoma of skin of right lower limb, including hip: Secondary | ICD-10-CM | POA: Diagnosis not present

## 2017-04-03 DIAGNOSIS — Z08 Encounter for follow-up examination after completed treatment for malignant neoplasm: Secondary | ICD-10-CM | POA: Diagnosis not present

## 2017-04-03 DIAGNOSIS — Z85828 Personal history of other malignant neoplasm of skin: Secondary | ICD-10-CM | POA: Diagnosis not present

## 2017-04-04 DIAGNOSIS — E7849 Other hyperlipidemia: Secondary | ICD-10-CM | POA: Diagnosis not present

## 2017-04-04 DIAGNOSIS — I7389 Other specified peripheral vascular diseases: Secondary | ICD-10-CM | POA: Diagnosis not present

## 2017-04-04 DIAGNOSIS — I10 Essential (primary) hypertension: Secondary | ICD-10-CM | POA: Diagnosis not present

## 2017-04-04 DIAGNOSIS — E119 Type 2 diabetes mellitus without complications: Secondary | ICD-10-CM | POA: Diagnosis not present

## 2017-04-11 DIAGNOSIS — E7849 Other hyperlipidemia: Secondary | ICD-10-CM | POA: Diagnosis not present

## 2017-04-11 DIAGNOSIS — E119 Type 2 diabetes mellitus without complications: Secondary | ICD-10-CM | POA: Diagnosis not present

## 2017-04-11 DIAGNOSIS — I10 Essential (primary) hypertension: Secondary | ICD-10-CM | POA: Diagnosis not present

## 2017-05-09 DIAGNOSIS — L278 Dermatitis due to other substances taken internally: Secondary | ICD-10-CM | POA: Diagnosis not present

## 2017-05-12 DIAGNOSIS — E7849 Other hyperlipidemia: Secondary | ICD-10-CM | POA: Diagnosis not present

## 2017-05-12 DIAGNOSIS — I10 Essential (primary) hypertension: Secondary | ICD-10-CM | POA: Diagnosis not present

## 2017-05-12 DIAGNOSIS — E119 Type 2 diabetes mellitus without complications: Secondary | ICD-10-CM | POA: Diagnosis not present

## 2017-05-12 DIAGNOSIS — I872 Venous insufficiency (chronic) (peripheral): Secondary | ICD-10-CM | POA: Diagnosis not present

## 2017-05-13 DIAGNOSIS — M4696 Unspecified inflammatory spondylopathy, lumbar region: Secondary | ICD-10-CM | POA: Diagnosis not present

## 2017-05-13 DIAGNOSIS — M545 Low back pain: Secondary | ICD-10-CM | POA: Diagnosis not present

## 2017-05-13 DIAGNOSIS — I700 Atherosclerosis of aorta: Secondary | ICD-10-CM | POA: Diagnosis not present

## 2017-05-15 DIAGNOSIS — C44722 Squamous cell carcinoma of skin of right lower limb, including hip: Secondary | ICD-10-CM | POA: Diagnosis not present

## 2017-05-15 DIAGNOSIS — D485 Neoplasm of uncertain behavior of skin: Secondary | ICD-10-CM | POA: Diagnosis not present

## 2017-05-15 DIAGNOSIS — Z85828 Personal history of other malignant neoplasm of skin: Secondary | ICD-10-CM | POA: Diagnosis not present

## 2017-05-22 DIAGNOSIS — Z6829 Body mass index (BMI) 29.0-29.9, adult: Secondary | ICD-10-CM | POA: Diagnosis not present

## 2017-05-22 DIAGNOSIS — M7138 Other bursal cyst, other site: Secondary | ICD-10-CM | POA: Diagnosis not present

## 2017-05-27 DIAGNOSIS — C44722 Squamous cell carcinoma of skin of right lower limb, including hip: Secondary | ICD-10-CM | POA: Diagnosis not present

## 2017-05-27 DIAGNOSIS — S81801A Unspecified open wound, right lower leg, initial encounter: Secondary | ICD-10-CM | POA: Diagnosis not present

## 2017-05-27 DIAGNOSIS — Z4801 Encounter for change or removal of surgical wound dressing: Secondary | ICD-10-CM | POA: Diagnosis not present

## 2017-06-12 DIAGNOSIS — S81801D Unspecified open wound, right lower leg, subsequent encounter: Secondary | ICD-10-CM | POA: Diagnosis not present

## 2017-06-12 DIAGNOSIS — C44722 Squamous cell carcinoma of skin of right lower limb, including hip: Secondary | ICD-10-CM | POA: Diagnosis not present

## 2017-06-17 DIAGNOSIS — S81801A Unspecified open wound, right lower leg, initial encounter: Secondary | ICD-10-CM | POA: Diagnosis not present

## 2017-06-17 DIAGNOSIS — Z4801 Encounter for change or removal of surgical wound dressing: Secondary | ICD-10-CM | POA: Diagnosis not present

## 2017-06-17 DIAGNOSIS — C44722 Squamous cell carcinoma of skin of right lower limb, including hip: Secondary | ICD-10-CM | POA: Diagnosis not present

## 2017-06-24 DIAGNOSIS — S81801A Unspecified open wound, right lower leg, initial encounter: Secondary | ICD-10-CM | POA: Diagnosis not present

## 2017-06-24 DIAGNOSIS — Z4801 Encounter for change or removal of surgical wound dressing: Secondary | ICD-10-CM | POA: Diagnosis not present

## 2017-07-01 DIAGNOSIS — Z4801 Encounter for change or removal of surgical wound dressing: Secondary | ICD-10-CM | POA: Diagnosis not present

## 2017-07-01 DIAGNOSIS — S81801A Unspecified open wound, right lower leg, initial encounter: Secondary | ICD-10-CM | POA: Diagnosis not present

## 2017-07-08 DIAGNOSIS — S81801A Unspecified open wound, right lower leg, initial encounter: Secondary | ICD-10-CM | POA: Diagnosis not present

## 2017-07-08 DIAGNOSIS — C44722 Squamous cell carcinoma of skin of right lower limb, including hip: Secondary | ICD-10-CM | POA: Diagnosis not present

## 2017-07-08 DIAGNOSIS — I10 Essential (primary) hypertension: Secondary | ICD-10-CM | POA: Diagnosis not present

## 2017-07-08 DIAGNOSIS — M545 Low back pain: Secondary | ICD-10-CM | POA: Diagnosis not present

## 2017-07-08 DIAGNOSIS — E1121 Type 2 diabetes mellitus with diabetic nephropathy: Secondary | ICD-10-CM | POA: Diagnosis not present

## 2017-07-28 DIAGNOSIS — E1121 Type 2 diabetes mellitus with diabetic nephropathy: Secondary | ICD-10-CM | POA: Diagnosis not present

## 2017-07-28 DIAGNOSIS — M545 Low back pain: Secondary | ICD-10-CM | POA: Diagnosis not present

## 2017-07-28 DIAGNOSIS — I10 Essential (primary) hypertension: Secondary | ICD-10-CM | POA: Diagnosis not present

## 2017-08-10 DIAGNOSIS — Z7982 Long term (current) use of aspirin: Secondary | ICD-10-CM | POA: Diagnosis not present

## 2017-08-10 DIAGNOSIS — M25552 Pain in left hip: Secondary | ICD-10-CM | POA: Diagnosis not present

## 2017-08-10 DIAGNOSIS — E119 Type 2 diabetes mellitus without complications: Secondary | ICD-10-CM | POA: Diagnosis not present

## 2017-08-10 DIAGNOSIS — J449 Chronic obstructive pulmonary disease, unspecified: Secondary | ICD-10-CM | POA: Diagnosis not present

## 2017-08-10 DIAGNOSIS — I10 Essential (primary) hypertension: Secondary | ICD-10-CM | POA: Diagnosis not present

## 2017-08-10 DIAGNOSIS — Z7984 Long term (current) use of oral hypoglycemic drugs: Secondary | ICD-10-CM | POA: Diagnosis not present

## 2017-08-10 DIAGNOSIS — M5432 Sciatica, left side: Secondary | ICD-10-CM | POA: Diagnosis not present

## 2017-08-10 DIAGNOSIS — Z79899 Other long term (current) drug therapy: Secondary | ICD-10-CM | POA: Diagnosis not present

## 2017-08-12 DIAGNOSIS — S81801D Unspecified open wound, right lower leg, subsequent encounter: Secondary | ICD-10-CM | POA: Diagnosis not present

## 2017-08-21 DIAGNOSIS — M1612 Unilateral primary osteoarthritis, left hip: Secondary | ICD-10-CM | POA: Diagnosis not present

## 2017-08-21 DIAGNOSIS — M79605 Pain in left leg: Secondary | ICD-10-CM | POA: Diagnosis not present

## 2017-08-21 DIAGNOSIS — Z96652 Presence of left artificial knee joint: Secondary | ICD-10-CM | POA: Diagnosis not present

## 2017-08-26 DIAGNOSIS — C44722 Squamous cell carcinoma of skin of right lower limb, including hip: Secondary | ICD-10-CM | POA: Diagnosis not present

## 2017-09-02 DIAGNOSIS — M4316 Spondylolisthesis, lumbar region: Secondary | ICD-10-CM | POA: Diagnosis not present

## 2017-09-02 DIAGNOSIS — Z6830 Body mass index (BMI) 30.0-30.9, adult: Secondary | ICD-10-CM | POA: Diagnosis not present

## 2017-09-02 DIAGNOSIS — M79605 Pain in left leg: Secondary | ICD-10-CM | POA: Diagnosis not present

## 2017-09-02 DIAGNOSIS — M7138 Other bursal cyst, other site: Secondary | ICD-10-CM | POA: Diagnosis not present

## 2017-09-02 DIAGNOSIS — M47816 Spondylosis without myelopathy or radiculopathy, lumbar region: Secondary | ICD-10-CM | POA: Diagnosis not present

## 2017-09-08 DIAGNOSIS — M7138 Other bursal cyst, other site: Secondary | ICD-10-CM | POA: Diagnosis not present

## 2017-09-08 DIAGNOSIS — M48061 Spinal stenosis, lumbar region without neurogenic claudication: Secondary | ICD-10-CM | POA: Diagnosis not present

## 2017-09-08 DIAGNOSIS — M47816 Spondylosis without myelopathy or radiculopathy, lumbar region: Secondary | ICD-10-CM | POA: Diagnosis not present

## 2017-09-08 DIAGNOSIS — M4807 Spinal stenosis, lumbosacral region: Secondary | ICD-10-CM | POA: Diagnosis not present

## 2017-09-11 DIAGNOSIS — M4316 Spondylolisthesis, lumbar region: Secondary | ICD-10-CM | POA: Diagnosis not present

## 2017-09-11 DIAGNOSIS — M47816 Spondylosis without myelopathy or radiculopathy, lumbar region: Secondary | ICD-10-CM | POA: Diagnosis not present

## 2017-09-11 DIAGNOSIS — Z6830 Body mass index (BMI) 30.0-30.9, adult: Secondary | ICD-10-CM | POA: Diagnosis not present

## 2017-09-11 DIAGNOSIS — M7138 Other bursal cyst, other site: Secondary | ICD-10-CM | POA: Diagnosis not present

## 2017-09-25 DIAGNOSIS — M47816 Spondylosis without myelopathy or radiculopathy, lumbar region: Secondary | ICD-10-CM | POA: Diagnosis not present

## 2017-09-25 DIAGNOSIS — Z6830 Body mass index (BMI) 30.0-30.9, adult: Secondary | ICD-10-CM | POA: Diagnosis not present

## 2017-09-25 DIAGNOSIS — M7138 Other bursal cyst, other site: Secondary | ICD-10-CM | POA: Diagnosis not present

## 2017-09-25 DIAGNOSIS — M4316 Spondylolisthesis, lumbar region: Secondary | ICD-10-CM | POA: Diagnosis not present

## 2017-10-01 DIAGNOSIS — I10 Essential (primary) hypertension: Secondary | ICD-10-CM | POA: Diagnosis not present

## 2017-10-01 DIAGNOSIS — M545 Low back pain: Secondary | ICD-10-CM | POA: Diagnosis not present

## 2017-10-01 DIAGNOSIS — E1121 Type 2 diabetes mellitus with diabetic nephropathy: Secondary | ICD-10-CM | POA: Diagnosis not present

## 2017-10-16 DIAGNOSIS — I10 Essential (primary) hypertension: Secondary | ICD-10-CM | POA: Diagnosis not present

## 2017-10-16 DIAGNOSIS — M545 Low back pain: Secondary | ICD-10-CM | POA: Diagnosis not present

## 2017-10-16 DIAGNOSIS — E1121 Type 2 diabetes mellitus with diabetic nephropathy: Secondary | ICD-10-CM | POA: Diagnosis not present

## 2017-10-20 DIAGNOSIS — I10 Essential (primary) hypertension: Secondary | ICD-10-CM | POA: Diagnosis not present

## 2017-10-20 DIAGNOSIS — Z7984 Long term (current) use of oral hypoglycemic drugs: Secondary | ICD-10-CM | POA: Diagnosis not present

## 2017-10-20 DIAGNOSIS — Z7982 Long term (current) use of aspirin: Secondary | ICD-10-CM | POA: Diagnosis not present

## 2017-10-20 DIAGNOSIS — Z7902 Long term (current) use of antithrombotics/antiplatelets: Secondary | ICD-10-CM | POA: Diagnosis not present

## 2017-10-20 DIAGNOSIS — M545 Low back pain: Secondary | ICD-10-CM | POA: Diagnosis not present

## 2017-10-20 DIAGNOSIS — Z79899 Other long term (current) drug therapy: Secondary | ICD-10-CM | POA: Diagnosis not present

## 2017-10-20 DIAGNOSIS — M62830 Muscle spasm of back: Secondary | ICD-10-CM | POA: Diagnosis not present

## 2017-10-20 DIAGNOSIS — J449 Chronic obstructive pulmonary disease, unspecified: Secondary | ICD-10-CM | POA: Diagnosis not present

## 2017-10-20 DIAGNOSIS — E119 Type 2 diabetes mellitus without complications: Secondary | ICD-10-CM | POA: Diagnosis not present

## 2017-10-20 DIAGNOSIS — M47816 Spondylosis without myelopathy or radiculopathy, lumbar region: Secondary | ICD-10-CM | POA: Diagnosis not present

## 2017-10-30 ENCOUNTER — Ambulatory Visit: Payer: Medicare Other | Admitting: Cardiovascular Disease

## 2017-11-06 DIAGNOSIS — L570 Actinic keratosis: Secondary | ICD-10-CM | POA: Diagnosis not present

## 2017-11-10 DIAGNOSIS — D485 Neoplasm of uncertain behavior of skin: Secondary | ICD-10-CM | POA: Diagnosis not present

## 2017-11-10 DIAGNOSIS — C44729 Squamous cell carcinoma of skin of left lower limb, including hip: Secondary | ICD-10-CM | POA: Diagnosis not present

## 2017-11-11 DIAGNOSIS — E1121 Type 2 diabetes mellitus with diabetic nephropathy: Secondary | ICD-10-CM | POA: Diagnosis not present

## 2017-11-11 DIAGNOSIS — I10 Essential (primary) hypertension: Secondary | ICD-10-CM | POA: Diagnosis not present

## 2017-11-11 DIAGNOSIS — M545 Low back pain: Secondary | ICD-10-CM | POA: Diagnosis not present

## 2017-12-02 ENCOUNTER — Encounter: Payer: Self-pay | Admitting: *Deleted

## 2017-12-03 ENCOUNTER — Telehealth: Payer: Self-pay | Admitting: Cardiology

## 2017-12-03 ENCOUNTER — Ambulatory Visit (INDEPENDENT_AMBULATORY_CARE_PROVIDER_SITE_OTHER): Payer: Medicare Other | Admitting: Cardiology

## 2017-12-03 ENCOUNTER — Encounter: Payer: Self-pay | Admitting: Cardiology

## 2017-12-03 VITALS — BP 116/72 | HR 68 | Ht 65.5 in | Wt 180.4 lb

## 2017-12-03 DIAGNOSIS — I350 Nonrheumatic aortic (valve) stenosis: Secondary | ICD-10-CM

## 2017-12-03 DIAGNOSIS — Z01810 Encounter for preprocedural cardiovascular examination: Secondary | ICD-10-CM | POA: Diagnosis not present

## 2017-12-03 DIAGNOSIS — E119 Type 2 diabetes mellitus without complications: Secondary | ICD-10-CM

## 2017-12-03 DIAGNOSIS — R0609 Other forms of dyspnea: Secondary | ICD-10-CM | POA: Diagnosis not present

## 2017-12-03 DIAGNOSIS — I10 Essential (primary) hypertension: Secondary | ICD-10-CM | POA: Diagnosis not present

## 2017-12-03 DIAGNOSIS — E782 Mixed hyperlipidemia: Secondary | ICD-10-CM | POA: Diagnosis not present

## 2017-12-03 NOTE — Progress Notes (Signed)
Cardiology Office Note  Date: 12/03/2017   ID: [name], DOB 1940-09-17, MRN [id number]  PCP: Neale Burly, MD  Consulting Cardiologist: Rozann Lesches, MD   Chief Complaint  Patient presents with  . Preoperative cardiac evaluation    History of Present Illness: [name] is a 77 y.o. female referred for cardiology consultation by Dr. Carloyn Manner for preoperative cardiac evaluation prior to planned lumbar spine surgery under general anesthesia.  She has been followed in our practice previously, saw Dr. Bronson Ing in July 2014.  Main functional limitation at this time is chronic lower back pain and leg weakness.  She reports NYHA class II dyspnea with most activities and reports "mild" COPD.  She does not report any exertional chest tightness, no palpitations or syncope.  She has 4 steps to get into her house and states that she can climb these although her legs are weak when she does so.  Previous exercise Myoview in 2014 was low risk demonstrating no diagnostic ST segment abnormalities and soft tissue attenuation without ischemia, LVEF 68%.  Echocardiogram at that time showed very mild aortic stenosis.  I personally reviewed her ECG today which shows sinus rhythm with prolonged PR interval and nonspecific T wave changes.  I went over her current medications.  She is on aspirin and Plavix (currently holding).  I do not know the specific indication for this, she states that she was placed on these medicines by Dr. Sherrie Sport.  Past Medical History:  Diagnosis Date  . Aortic stenosis   . Carpal tunnel syndrome, right   . COPD (chronic obstructive pulmonary disease) ([place])   . DJD (degenerative joint disease)   . Eczematous dermatitis   . Essential hypertension   . Gallstones   . Hyperlipidemia   . Osteoarthritis   . Rotator cuff syndrome   . Spinal stenosis of lumbar region   . Squamous cell carcinoma   . Type 2 diabetes mellitus ([place])     Past Surgical History:    Procedure Laterality Date  . BACK SURGERY    . CATARACT EXTRACTION  2011   bilateral  . JOINT REPLACEMENT  09-27-11   '04/ right with resection for infection, now antibiotic spacer planned  . JOINT REPLACEMENT     not replaced- only surgery 2007/2011/2012.  . LUMBAR LAMINECTOMY/DECOMPRESSION MICRODISCECTOMY  04/19/2011   Procedure: LUMBAR LAMINECTOMY/DECOMPRESSION MICRODISCECTOMY;  Surgeon: Tobi Bastos;  Location: [facility];  Service: Orthopedics;  Laterality: N/A;  Central decompression Lumbar two to lumbar three and Three to Lumbar Four    (xray)  . TOTAL KNEE REVISION  12/04/2011   Procedure: TOTAL KNEE REVISION;  Surgeon: Gearlean Alf, MD;  Location: [facility];  Service: Orthopedics;  Laterality: Right;  Reimplantation of a Right Total Knee Arthroplasty  . TUBAL LIGATION      Current Outpatient Medications  Medication Sig Dispense Refill  . aspirin EC 81 MG tablet Take 81 mg by mouth daily.    . Ferrous Sulfate (IRON) 325 (65 FE) MG TABS Take 65 mg by mouth. Occasionally    . fish oil-omega-3 fatty acids 1000 MG capsule Take 2 g by mouth daily.    Marland Kitchen labetalol (NORMODYNE) 300 MG tablet Take 300 mg by mouth 2 (two) times daily.    . metFORMIN (GLUCOPHAGE) 500 MG tablet Take 1 tablet by mouth 2 (two) times daily.    . Multiple Vitamin (MULTIVITAMIN) tablet Take 1 tablet by mouth daily.    . Multiple Vitamins-Minerals (VITAMIN D3  COMPLETE PO) Take 1 tablet by mouth daily.    Marland Kitchen oxyCODONE-acetaminophen (PERCOCET/ROXICET) 5-325 MG per tablet Take 2 tablets by mouth every 4 (four) hours as needed for severe pain. 10 tablet 0  . quinapril-hydrochlorothiazide (ACCURETIC) 20-12.5 MG per tablet Take 1 tablet by mouth every morning.     . vitamin C (ASCORBIC ACID) 500 MG tablet Take 500 mg by mouth daily.    Marland Kitchen VITAMIN E PO Take 1 tablet by mouth daily.    . clopidogrel (PLAVIX) 75 MG tablet Take 75 mg by mouth daily.      No current facility-administered medications for this visit.     Allergies:  Ciprofloxacin   Social History: The patient  reports that she has never smoked. She has never used smokeless tobacco. She reports that she does not drink alcohol or use drugs.   Family History: The patient's family history includes Cancer in her mother; Coronary artery disease in her other; Diabetes in her unknown relative; Heart attack in her brother, brother, brother, and mother; Heart disease in her father.   ROS:  Please see the history of present illness. Otherwise, complete review of systems is positive for left shoulder arthritic pain.  All other systems are reviewed and negative.   Physical Exam: VS:  BP 116/72   Pulse 68   Ht 5' 5.5" (1.664 m)   Wt 180 lb 6.4 oz (81.8 kg)   SpO2 96%   BMI 29.56 kg/m , BMI Body mass index is 29.56 kg/m.  Wt Readings from Last 3 Encounters:  12/03/17 180 lb 6.4 oz (81.8 kg)  08/09/14 192 lb (87.1 kg)  11/15/13 202 lb (91.6 kg)    General: Patient appears comfortable at rest. HEENT: Conjunctiva and lids normal, oropharynx clear. Neck: Supple, no elevated JVP or carotid bruits, no thyromegaly. Lungs: Decreased breath sounds without wheezing, nonlabored breathing at rest. Cardiac: Regular rate and rhythm, no S3 or significant systolic murmur. Abdomen: Soft, nontender, bowel sounds present. Extremities: No pitting edema, distal pulses 2+. Skin: Warm and dry. Musculoskeletal: No kyphosis. Neuropsychiatric: Alert and oriented x3, affect grossly appropriate.  ECG: I personally reviewed the tracing from 12/18/2012 which showed sinus rhythm with prolonged PR interval and nonspecific T wave changes.  Recent Labwork:  No recent lab work available for review.  Other Studies Reviewed Today:  Echocardiogram 11/11/2012: Study Conclusions  - Left ventricle: The cavity size was normal. Wall thickness was increased in a pattern of moderate LVH. Systolic function was normal. The estimated ejection fraction was in the range of  55% to 65%. Wall motion was normal; there were no regional wall motion abnormalities. Left ventricular diastolic function parameters were normal. - Aortic valve: There was very mild stenosis. Trivial regurgitation. - Mitral valve: Mild regurgitation. - Atrial septum: No defect or patent foramen ovale was identified. - Pulmonary arteries: PA peak pressure: 37mm Hg (S). Impressions:  - No cardiac source of emboli was indentified.  Exercise Myoview 12/25/2012: Impression:  Low-risk exercise/Lexiscan Myoview as outlined.  The patient did not report chest pain, and there were no diagnostic ST-segment abnormalities and no significant arrhythmias.  Perfusion imaging is most consistent with soft tissue attenuation as outlined, with no clear evidence of ischemia or scar.  Normal LV volumes with LVEF 68% and no focal wall motion abnormalities.  Assessment and Plan:  1.  Preoperative cardiac evaluation in a 77 year old woman with history of type 2 diabetes mellitus, hypertension, hyperlipidemia, and reportedly mild COPD.  She is being considered for lumbar  spine surgery under general anesthesia with Dr. Carloyn Manner.  Cardiac assessment in 2014 revealed very mild aortic stenosis, cardiac murmur is not prominent at this time.  Ischemic testing in 2014 was also low risk.  In light of the fact that objective cardiac assessment was done 5 years ago and she has active cardiac risk factors, we will plan a follow-up echocardiogram and Lexiscan Myoview to better risk stratify.  Otherwise, I do not have any recent laboratory information or definitive grading of her COPD, would suggest obtaining this information from Dr. Sherrie Sport.  2.  Diabetes mellitus, on Glucophage, followed by Dr. Sherrie Sport.  3.  Essential hypertension, on labetalol and Accuretic.  She follows with Dr. Sherrie Sport.  Blood pressure is normal today.  4.  Mixed hyperlipidemia, diet managed and on omega-3 supplements.  She follows with Dr.  Sherrie Sport.  5.  Currently on dual antiplatelet therapy, indication is uncertain.  Would suggest clarifying this with Dr. Sherrie Sport.  Current medicines were reviewed with the patient today.   Orders Placed This Encounter  Procedures  . EKG 12-Lead    Disposition: Call with test results.  Signed, Satira Sark, MD, [facility] 12/03/2017 3:11 PM    [facility] [place], [place], [place] [address] Phone: [phone]; Fax: [phone]

## 2017-12-03 NOTE — Patient Instructions (Signed)
Medication Instructions:  Your physician recommends that you continue on your current medications as directed. Please refer to the Current Medication list given to you today.  Labwork: NONE  Testing/Procedures: Your physician has requested that you have an echocardiogram. Echocardiography is a painless test that uses sound waves to create images of your heart. It provides your doctor with information about the size and shape of your heart and how well your heart's chambers and valves are working. This procedure takes approximately one hour. There are no restrictions for this procedure.  Your physician has requested that you have a lexiscan myoview. For further information please visit [email or web address]. Please follow instruction sheet, as given.  Follow-Up: Your physician recommends that you schedule a follow-up appointment AS NEEDED   Any Other Special Instructions Will Be Listed Below (If Applicable).  If you need a refill on your cardiac medications before your next appointment, please call your pharmacy.

## 2017-12-03 NOTE — Telephone Encounter (Signed)
Pre-cert Verification for the following procedure Lexiscan & Echo scheduled for 12-09-17 at [facility]

## 2017-12-09 ENCOUNTER — Encounter (HOSPITAL_BASED_OUTPATIENT_CLINIC_OR_DEPARTMENT_OTHER)
Admission: RE | Admit: 2017-12-09 | Discharge: 2017-12-09 | Disposition: A | Discharge status: Home / Self Care | Payer: Medicare Other | Source: Ambulatory Visit | Attending: Cardiology | Admitting: Cardiology

## 2017-12-09 ENCOUNTER — Ambulatory Visit (HOSPITAL_COMMUNITY)
Admission: RE | Admit: 2017-12-09 | Discharge: 2017-12-09 | Disposition: A | Discharge status: Home / Self Care | Payer: Medicare Other | Source: Ambulatory Visit | Attending: Cardiology | Admitting: Cardiology

## 2017-12-09 ENCOUNTER — Encounter (HOSPITAL_COMMUNITY): Payer: Self-pay

## 2017-12-09 DIAGNOSIS — Z01810 Encounter for preprocedural cardiovascular examination: Secondary | ICD-10-CM | POA: Diagnosis not present

## 2017-12-09 DIAGNOSIS — E785 Hyperlipidemia, unspecified: Secondary | ICD-10-CM | POA: Insufficient documentation

## 2017-12-09 DIAGNOSIS — I10 Essential (primary) hypertension: Secondary | ICD-10-CM | POA: Diagnosis not present

## 2017-12-09 DIAGNOSIS — I350 Nonrheumatic aortic (valve) stenosis: Secondary | ICD-10-CM

## 2017-12-09 DIAGNOSIS — I083 Combined rheumatic disorders of mitral, aortic and tricuspid valves: Secondary | ICD-10-CM | POA: Diagnosis not present

## 2017-12-09 DIAGNOSIS — E119 Type 2 diabetes mellitus without complications: Secondary | ICD-10-CM | POA: Insufficient documentation

## 2017-12-09 DIAGNOSIS — R0609 Other forms of dyspnea: Secondary | ICD-10-CM

## 2017-12-09 LAB — NM MYOCAR MULTI W/SPECT W/WALL MOTION / EF
CHL CUP NUCLEAR SDS: 4
CHL CUP NUCLEAR SRS: 3
CHL CUP NUCLEAR SSS: 7
CSEPPHR: 93 {beats}/min
LV dias vol: 74 mL (ref 46–106)
LV sys vol: 27 mL
RATE: 0.5
Rest HR: 69 {beats}/min
TID: 1.21

## 2017-12-09 MED ORDER — TECHNETIUM TC 99M TETROFOSMIN IV KIT
30.0000 | PACK | Freq: Once | INTRAVENOUS | Status: AC | PRN
  Administered 2017-12-09: 27 via INTRAVENOUS

## 2017-12-09 MED ORDER — REGADENOSON 0.4 MG/5ML IV SOLN
INTRAVENOUS | Status: AC
  Administered 2017-12-09: 0.4 mg via INTRAVENOUS
  Filled 2017-12-09: qty 5

## 2017-12-09 MED ORDER — TECHNETIUM TC 99M TETROFOSMIN IV KIT
10.0000 | PACK | Freq: Once | INTRAVENOUS | Status: AC | PRN
  Administered 2017-12-09: 8.39 via INTRAVENOUS

## 2017-12-09 MED ORDER — SODIUM CHLORIDE 0.9% FLUSH
INTRAVENOUS | Status: AC
  Administered 2017-12-09: 10 mL via INTRAVENOUS
  Filled 2017-12-09: qty 10

## 2017-12-09 NOTE — Progress Notes (Signed)
*  PRELIMINARY RESULTS* Echocardiogram 2D Echocardiogram has been performed.  Samuel Germany 12/09/2017, 1:41 PM

## 2017-12-10 ENCOUNTER — Telehealth: Payer: Self-pay | Admitting: *Deleted

## 2017-12-10 NOTE — Telephone Encounter (Signed)
-----   Message from Satira Sark, MD sent at 12/09/2017 12:55 PM EDT ----- Results reviewed.  Stress test was low risk, no significant ischemia and LVEF normal at 63%.  This would suggest a low perioperative risk of cardiac events with spine surgery, also supported by RCRI calculated index.  Follow-up on echocardiogram results for reassessment of aortic stenosis. A copy of this test should be forwarded to Neale Burly, MD.

## 2017-12-10 NOTE — Telephone Encounter (Signed)
-----   Message from Satira Sark, MD sent at 12/09/2017  4:24 PM EDT ----- Results reviewed.  LVEF normal range of 60 to 65% and aortic stenosis mild overall.  Based on this and the recent Myoview, she should be able to proceed with planned spine surgery with Dr. Carloyn Manner at relatively low perioperative cardiac risk. A copy of this test should be forwarded to Neale Burly, MD.

## 2017-12-12 ENCOUNTER — Telehealth: Payer: Self-pay | Admitting: Cardiology

## 2017-12-12 NOTE — Telephone Encounter (Signed)
Patient coming in to see them today 1:30 needs to know if she was cleared  If she hasn't been they will need to cancel her appointment

## 2017-12-12 NOTE — Telephone Encounter (Signed)
Results routed to Dr. Estanislado Emms office.

## 2017-12-15 DIAGNOSIS — M7138 Other bursal cyst, other site: Secondary | ICD-10-CM | POA: Diagnosis not present

## 2017-12-15 DIAGNOSIS — Z6830 Body mass index (BMI) 30.0-30.9, adult: Secondary | ICD-10-CM | POA: Diagnosis not present

## 2017-12-15 DIAGNOSIS — M47816 Spondylosis without myelopathy or radiculopathy, lumbar region: Secondary | ICD-10-CM | POA: Diagnosis not present

## 2017-12-22 ENCOUNTER — Encounter: Payer: Self-pay | Admitting: *Deleted

## 2017-12-22 NOTE — Telephone Encounter (Signed)
Mailed to patient. Copy sent to PCP.

## 2017-12-30 ENCOUNTER — Telehealth: Payer: Self-pay | Admitting: Cardiology

## 2017-12-30 NOTE — Telephone Encounter (Signed)
Patient informed of stress and echo results.

## 2017-12-30 NOTE — Telephone Encounter (Signed)
Asked to speak with [name] in reference to results from a few weeks back

## 2018-01-02 DIAGNOSIS — M4712 Other spondylosis with myelopathy, cervical region: Secondary | ICD-10-CM | POA: Diagnosis not present

## 2018-01-02 DIAGNOSIS — Z981 Arthrodesis status: Secondary | ICD-10-CM | POA: Diagnosis not present

## 2018-01-02 DIAGNOSIS — M47812 Spondylosis without myelopathy or radiculopathy, cervical region: Secondary | ICD-10-CM | POA: Diagnosis not present

## 2018-01-02 DIAGNOSIS — Z6830 Body mass index (BMI) 30.0-30.9, adult: Secondary | ICD-10-CM | POA: Diagnosis not present

## 2018-01-26 DIAGNOSIS — E1121 Type 2 diabetes mellitus with diabetic nephropathy: Secondary | ICD-10-CM | POA: Diagnosis not present

## 2018-01-26 DIAGNOSIS — M545 Low back pain: Secondary | ICD-10-CM | POA: Diagnosis not present

## 2018-01-26 DIAGNOSIS — E782 Mixed hyperlipidemia: Secondary | ICD-10-CM | POA: Diagnosis not present

## 2018-01-26 DIAGNOSIS — I10 Essential (primary) hypertension: Secondary | ICD-10-CM | POA: Diagnosis not present

## 2018-02-06 DIAGNOSIS — Z23 Encounter for immunization: Secondary | ICD-10-CM | POA: Diagnosis not present

## 2018-02-10 DIAGNOSIS — I10 Essential (primary) hypertension: Secondary | ICD-10-CM | POA: Diagnosis not present

## 2018-02-10 DIAGNOSIS — M545 Low back pain: Secondary | ICD-10-CM | POA: Diagnosis not present

## 2018-02-10 DIAGNOSIS — E782 Mixed hyperlipidemia: Secondary | ICD-10-CM | POA: Diagnosis not present

## 2018-02-10 DIAGNOSIS — E1121 Type 2 diabetes mellitus with diabetic nephropathy: Secondary | ICD-10-CM | POA: Diagnosis not present

## 2018-02-12 DIAGNOSIS — I517 Cardiomegaly: Secondary | ICD-10-CM | POA: Diagnosis not present

## 2018-02-12 DIAGNOSIS — I10 Essential (primary) hypertension: Secondary | ICD-10-CM | POA: Diagnosis not present

## 2018-02-12 DIAGNOSIS — M47816 Spondylosis without myelopathy or radiculopathy, lumbar region: Secondary | ICD-10-CM | POA: Diagnosis not present

## 2018-02-12 DIAGNOSIS — Z7984 Long term (current) use of oral hypoglycemic drugs: Secondary | ICD-10-CM | POA: Diagnosis not present

## 2018-02-12 DIAGNOSIS — Z01818 Encounter for other preprocedural examination: Secondary | ICD-10-CM | POA: Diagnosis not present

## 2018-02-12 DIAGNOSIS — E119 Type 2 diabetes mellitus without complications: Secondary | ICD-10-CM | POA: Diagnosis not present

## 2018-02-12 DIAGNOSIS — Z79899 Other long term (current) drug therapy: Secondary | ICD-10-CM | POA: Diagnosis not present

## 2018-02-16 DIAGNOSIS — I10 Essential (primary) hypertension: Secondary | ICD-10-CM | POA: Diagnosis present

## 2018-02-16 DIAGNOSIS — M479 Spondylosis, unspecified: Secondary | ICD-10-CM | POA: Diagnosis not present

## 2018-02-16 DIAGNOSIS — M7138 Other bursal cyst, other site: Secondary | ICD-10-CM | POA: Diagnosis present

## 2018-02-16 DIAGNOSIS — E119 Type 2 diabetes mellitus without complications: Secondary | ICD-10-CM | POA: Diagnosis present

## 2018-02-16 DIAGNOSIS — J449 Chronic obstructive pulmonary disease, unspecified: Secondary | ICD-10-CM | POA: Diagnosis present

## 2018-02-16 DIAGNOSIS — M48061 Spinal stenosis, lumbar region without neurogenic claudication: Secondary | ICD-10-CM | POA: Diagnosis not present

## 2018-02-16 DIAGNOSIS — M4316 Spondylolisthesis, lumbar region: Secondary | ICD-10-CM | POA: Diagnosis present

## 2018-02-16 DIAGNOSIS — Z4789 Encounter for other orthopedic aftercare: Secondary | ICD-10-CM | POA: Diagnosis not present

## 2018-02-16 DIAGNOSIS — M6281 Muscle weakness (generalized): Secondary | ICD-10-CM | POA: Diagnosis not present

## 2018-02-16 DIAGNOSIS — M47896 Other spondylosis, lumbar region: Secondary | ICD-10-CM | POA: Diagnosis present

## 2018-02-16 DIAGNOSIS — Z87891 Personal history of nicotine dependence: Secondary | ICD-10-CM | POA: Diagnosis not present

## 2018-02-16 DIAGNOSIS — M4716 Other spondylosis with myelopathy, lumbar region: Secondary | ICD-10-CM | POA: Diagnosis not present

## 2018-02-16 DIAGNOSIS — M4327 Fusion of spine, lumbosacral region: Secondary | ICD-10-CM | POA: Diagnosis not present

## 2018-02-16 DIAGNOSIS — Z7902 Long term (current) use of antithrombotics/antiplatelets: Secondary | ICD-10-CM | POA: Diagnosis not present

## 2018-02-16 DIAGNOSIS — Z780 Asymptomatic menopausal state: Secondary | ICD-10-CM | POA: Diagnosis not present

## 2018-02-16 DIAGNOSIS — R2689 Other abnormalities of gait and mobility: Secondary | ICD-10-CM | POA: Diagnosis not present

## 2018-02-16 DIAGNOSIS — M4326 Fusion of spine, lumbar region: Secondary | ICD-10-CM | POA: Diagnosis not present

## 2018-02-16 DIAGNOSIS — J441 Chronic obstructive pulmonary disease with (acute) exacerbation: Secondary | ICD-10-CM | POA: Diagnosis not present

## 2018-02-16 DIAGNOSIS — M47816 Spondylosis without myelopathy or radiculopathy, lumbar region: Secondary | ICD-10-CM | POA: Diagnosis not present

## 2018-02-16 DIAGNOSIS — Z7984 Long term (current) use of oral hypoglycemic drugs: Secondary | ICD-10-CM | POA: Diagnosis not present

## 2018-02-16 DIAGNOSIS — Z79899 Other long term (current) drug therapy: Secondary | ICD-10-CM | POA: Diagnosis not present

## 2018-02-23 DIAGNOSIS — M47896 Other spondylosis, lumbar region: Secondary | ICD-10-CM | POA: Diagnosis not present

## 2018-02-23 DIAGNOSIS — M4326 Fusion of spine, lumbar region: Secondary | ICD-10-CM | POA: Diagnosis not present

## 2018-02-23 DIAGNOSIS — E1121 Type 2 diabetes mellitus with diabetic nephropathy: Secondary | ICD-10-CM | POA: Diagnosis not present

## 2018-02-23 DIAGNOSIS — Z4789 Encounter for other orthopedic aftercare: Secondary | ICD-10-CM | POA: Diagnosis not present

## 2018-02-23 DIAGNOSIS — M4807 Spinal stenosis, lumbosacral region: Secondary | ICD-10-CM | POA: Diagnosis not present

## 2018-02-23 DIAGNOSIS — R2689 Other abnormalities of gait and mobility: Secondary | ICD-10-CM | POA: Diagnosis not present

## 2018-02-23 DIAGNOSIS — M48061 Spinal stenosis, lumbar region without neurogenic claudication: Secondary | ICD-10-CM | POA: Diagnosis not present

## 2018-02-23 DIAGNOSIS — I10 Essential (primary) hypertension: Secondary | ICD-10-CM | POA: Diagnosis not present

## 2018-02-23 DIAGNOSIS — E119 Type 2 diabetes mellitus without complications: Secondary | ICD-10-CM | POA: Diagnosis not present

## 2018-02-23 DIAGNOSIS — J441 Chronic obstructive pulmonary disease with (acute) exacerbation: Secondary | ICD-10-CM | POA: Diagnosis not present

## 2018-02-23 DIAGNOSIS — M47816 Spondylosis without myelopathy or radiculopathy, lumbar region: Secondary | ICD-10-CM | POA: Diagnosis not present

## 2018-02-23 DIAGNOSIS — M6281 Muscle weakness (generalized): Secondary | ICD-10-CM | POA: Diagnosis not present

## 2018-02-24 DIAGNOSIS — I10 Essential (primary) hypertension: Secondary | ICD-10-CM | POA: Diagnosis not present

## 2018-02-24 DIAGNOSIS — E1121 Type 2 diabetes mellitus with diabetic nephropathy: Secondary | ICD-10-CM | POA: Diagnosis not present

## 2018-02-24 DIAGNOSIS — M4326 Fusion of spine, lumbar region: Secondary | ICD-10-CM | POA: Diagnosis not present

## 2018-02-24 DIAGNOSIS — M4807 Spinal stenosis, lumbosacral region: Secondary | ICD-10-CM | POA: Diagnosis not present

## 2018-03-10 DIAGNOSIS — M48061 Spinal stenosis, lumbar region without neurogenic claudication: Secondary | ICD-10-CM | POA: Diagnosis not present

## 2018-03-10 DIAGNOSIS — R2689 Other abnormalities of gait and mobility: Secondary | ICD-10-CM | POA: Diagnosis not present

## 2018-03-10 DIAGNOSIS — M47896 Other spondylosis, lumbar region: Secondary | ICD-10-CM | POA: Diagnosis not present

## 2018-03-10 DIAGNOSIS — J441 Chronic obstructive pulmonary disease with (acute) exacerbation: Secondary | ICD-10-CM | POA: Diagnosis not present

## 2018-03-10 DIAGNOSIS — M6281 Muscle weakness (generalized): Secondary | ICD-10-CM | POA: Diagnosis not present

## 2018-03-10 DIAGNOSIS — E119 Type 2 diabetes mellitus without complications: Secondary | ICD-10-CM | POA: Diagnosis not present

## 2018-03-10 DIAGNOSIS — Z4789 Encounter for other orthopedic aftercare: Secondary | ICD-10-CM | POA: Diagnosis not present

## 2018-03-10 DIAGNOSIS — I10 Essential (primary) hypertension: Secondary | ICD-10-CM | POA: Diagnosis not present

## 2018-03-20 DIAGNOSIS — M6281 Muscle weakness (generalized): Secondary | ICD-10-CM | POA: Diagnosis not present

## 2018-03-20 DIAGNOSIS — J441 Chronic obstructive pulmonary disease with (acute) exacerbation: Secondary | ICD-10-CM | POA: Diagnosis not present

## 2018-03-20 DIAGNOSIS — Z4789 Encounter for other orthopedic aftercare: Secondary | ICD-10-CM | POA: Diagnosis not present

## 2018-03-20 DIAGNOSIS — M48061 Spinal stenosis, lumbar region without neurogenic claudication: Secondary | ICD-10-CM | POA: Diagnosis not present

## 2018-03-20 DIAGNOSIS — I10 Essential (primary) hypertension: Secondary | ICD-10-CM | POA: Diagnosis not present

## 2018-03-20 DIAGNOSIS — E119 Type 2 diabetes mellitus without complications: Secondary | ICD-10-CM | POA: Diagnosis not present

## 2018-03-24 DIAGNOSIS — Z4789 Encounter for other orthopedic aftercare: Secondary | ICD-10-CM | POA: Diagnosis not present

## 2018-03-24 DIAGNOSIS — I10 Essential (primary) hypertension: Secondary | ICD-10-CM | POA: Diagnosis not present

## 2018-03-24 DIAGNOSIS — J441 Chronic obstructive pulmonary disease with (acute) exacerbation: Secondary | ICD-10-CM | POA: Diagnosis not present

## 2018-03-24 DIAGNOSIS — M6281 Muscle weakness (generalized): Secondary | ICD-10-CM | POA: Diagnosis not present

## 2018-03-24 DIAGNOSIS — M48061 Spinal stenosis, lumbar region without neurogenic claudication: Secondary | ICD-10-CM | POA: Diagnosis not present

## 2018-03-24 DIAGNOSIS — E119 Type 2 diabetes mellitus without complications: Secondary | ICD-10-CM | POA: Diagnosis not present

## 2018-03-25 ENCOUNTER — Other Ambulatory Visit: Payer: Self-pay

## 2018-03-25 NOTE — Patient Outreach (Signed)
[facility] [facility]) Care Management  03/25/2018  [name] 09/01/1940 [id number]   EMMI- General Discharge RED ON EMMI ALERT Day # 4 Date: 03/24/18 Red Alert Reason:  Unfilled prescriptions? Yes Sad/hopeless/anxious/empty? Yes   Outreach attempt: spoke with patient. Addressed red alerts. Patient states the only two medications she has not filled is her muscle relaxer and medication for incontinence.  She states she will be getting those filled today.  She states she wanted to get them filled at this same time.  Patient to see PCP on 03-30-18 for follow up and she will be call surgeon office for follow up.  She states she has transportation to appointments.  Patient denies any problems with depression.  Patient has family support and denies any further needs at this time.     Plan: RN CM will close case.    Jone Baseman, RN, MSN [facility] Care Management Care Management Coordinator Direct Line [phone] Toll Free: [phone]  Fax: [phone]

## 2018-03-26 DIAGNOSIS — I10 Essential (primary) hypertension: Secondary | ICD-10-CM | POA: Diagnosis not present

## 2018-03-26 DIAGNOSIS — J441 Chronic obstructive pulmonary disease with (acute) exacerbation: Secondary | ICD-10-CM | POA: Diagnosis not present

## 2018-03-26 DIAGNOSIS — M6281 Muscle weakness (generalized): Secondary | ICD-10-CM | POA: Diagnosis not present

## 2018-03-26 DIAGNOSIS — E119 Type 2 diabetes mellitus without complications: Secondary | ICD-10-CM | POA: Diagnosis not present

## 2018-03-26 DIAGNOSIS — Z4789 Encounter for other orthopedic aftercare: Secondary | ICD-10-CM | POA: Diagnosis not present

## 2018-03-26 DIAGNOSIS — M48061 Spinal stenosis, lumbar region without neurogenic claudication: Secondary | ICD-10-CM | POA: Diagnosis not present

## 2018-03-27 DIAGNOSIS — J441 Chronic obstructive pulmonary disease with (acute) exacerbation: Secondary | ICD-10-CM | POA: Diagnosis not present

## 2018-03-27 DIAGNOSIS — E119 Type 2 diabetes mellitus without complications: Secondary | ICD-10-CM | POA: Diagnosis not present

## 2018-03-27 DIAGNOSIS — I10 Essential (primary) hypertension: Secondary | ICD-10-CM | POA: Diagnosis not present

## 2018-03-27 DIAGNOSIS — M6281 Muscle weakness (generalized): Secondary | ICD-10-CM | POA: Diagnosis not present

## 2018-03-27 DIAGNOSIS — M48061 Spinal stenosis, lumbar region without neurogenic claudication: Secondary | ICD-10-CM | POA: Diagnosis not present

## 2018-03-27 DIAGNOSIS — Z4789 Encounter for other orthopedic aftercare: Secondary | ICD-10-CM | POA: Diagnosis not present

## 2018-03-30 DIAGNOSIS — M47816 Spondylosis without myelopathy or radiculopathy, lumbar region: Secondary | ICD-10-CM | POA: Diagnosis not present

## 2018-03-30 DIAGNOSIS — Z981 Arthrodesis status: Secondary | ICD-10-CM | POA: Diagnosis not present

## 2018-03-30 DIAGNOSIS — M461 Sacroiliitis, not elsewhere classified: Secondary | ICD-10-CM | POA: Diagnosis not present

## 2018-03-31 DIAGNOSIS — J441 Chronic obstructive pulmonary disease with (acute) exacerbation: Secondary | ICD-10-CM | POA: Diagnosis not present

## 2018-03-31 DIAGNOSIS — Z4789 Encounter for other orthopedic aftercare: Secondary | ICD-10-CM | POA: Diagnosis not present

## 2018-03-31 DIAGNOSIS — I10 Essential (primary) hypertension: Secondary | ICD-10-CM | POA: Diagnosis not present

## 2018-03-31 DIAGNOSIS — M6281 Muscle weakness (generalized): Secondary | ICD-10-CM | POA: Diagnosis not present

## 2018-03-31 DIAGNOSIS — M48061 Spinal stenosis, lumbar region without neurogenic claudication: Secondary | ICD-10-CM | POA: Diagnosis not present

## 2018-03-31 DIAGNOSIS — E119 Type 2 diabetes mellitus without complications: Secondary | ICD-10-CM | POA: Diagnosis not present

## 2018-04-01 DIAGNOSIS — Z4789 Encounter for other orthopedic aftercare: Secondary | ICD-10-CM | POA: Diagnosis not present

## 2018-04-01 DIAGNOSIS — I10 Essential (primary) hypertension: Secondary | ICD-10-CM | POA: Diagnosis not present

## 2018-04-01 DIAGNOSIS — M48061 Spinal stenosis, lumbar region without neurogenic claudication: Secondary | ICD-10-CM | POA: Diagnosis not present

## 2018-04-01 DIAGNOSIS — E119 Type 2 diabetes mellitus without complications: Secondary | ICD-10-CM | POA: Diagnosis not present

## 2018-04-01 DIAGNOSIS — M6281 Muscle weakness (generalized): Secondary | ICD-10-CM | POA: Diagnosis not present

## 2018-04-01 DIAGNOSIS — J441 Chronic obstructive pulmonary disease with (acute) exacerbation: Secondary | ICD-10-CM | POA: Diagnosis not present

## 2018-04-03 DIAGNOSIS — J441 Chronic obstructive pulmonary disease with (acute) exacerbation: Secondary | ICD-10-CM | POA: Diagnosis not present

## 2018-04-03 DIAGNOSIS — M6281 Muscle weakness (generalized): Secondary | ICD-10-CM | POA: Diagnosis not present

## 2018-04-03 DIAGNOSIS — E119 Type 2 diabetes mellitus without complications: Secondary | ICD-10-CM | POA: Diagnosis not present

## 2018-04-03 DIAGNOSIS — I10 Essential (primary) hypertension: Secondary | ICD-10-CM | POA: Diagnosis not present

## 2018-04-03 DIAGNOSIS — Z4789 Encounter for other orthopedic aftercare: Secondary | ICD-10-CM | POA: Diagnosis not present

## 2018-04-03 DIAGNOSIS — M48061 Spinal stenosis, lumbar region without neurogenic claudication: Secondary | ICD-10-CM | POA: Diagnosis not present

## 2018-04-06 DIAGNOSIS — M48061 Spinal stenosis, lumbar region without neurogenic claudication: Secondary | ICD-10-CM | POA: Diagnosis not present

## 2018-04-06 DIAGNOSIS — Z4789 Encounter for other orthopedic aftercare: Secondary | ICD-10-CM | POA: Diagnosis not present

## 2018-04-06 DIAGNOSIS — M6281 Muscle weakness (generalized): Secondary | ICD-10-CM | POA: Diagnosis not present

## 2018-04-06 DIAGNOSIS — E119 Type 2 diabetes mellitus without complications: Secondary | ICD-10-CM | POA: Diagnosis not present

## 2018-04-06 DIAGNOSIS — J441 Chronic obstructive pulmonary disease with (acute) exacerbation: Secondary | ICD-10-CM | POA: Diagnosis not present

## 2018-04-06 DIAGNOSIS — I10 Essential (primary) hypertension: Secondary | ICD-10-CM | POA: Diagnosis not present

## 2018-04-07 DIAGNOSIS — Z1389 Encounter for screening for other disorder: Secondary | ICD-10-CM | POA: Diagnosis not present

## 2018-04-07 DIAGNOSIS — M545 Low back pain: Secondary | ICD-10-CM | POA: Diagnosis not present

## 2018-04-07 DIAGNOSIS — I10 Essential (primary) hypertension: Secondary | ICD-10-CM | POA: Diagnosis not present

## 2018-04-07 DIAGNOSIS — Z0000 Encounter for general adult medical examination without abnormal findings: Secondary | ICD-10-CM | POA: Diagnosis not present

## 2018-04-07 DIAGNOSIS — E782 Mixed hyperlipidemia: Secondary | ICD-10-CM | POA: Diagnosis not present

## 2018-04-07 DIAGNOSIS — E1121 Type 2 diabetes mellitus with diabetic nephropathy: Secondary | ICD-10-CM | POA: Diagnosis not present

## 2018-04-08 DIAGNOSIS — M6281 Muscle weakness (generalized): Secondary | ICD-10-CM | POA: Diagnosis not present

## 2018-04-08 DIAGNOSIS — J441 Chronic obstructive pulmonary disease with (acute) exacerbation: Secondary | ICD-10-CM | POA: Diagnosis not present

## 2018-04-08 DIAGNOSIS — Z4789 Encounter for other orthopedic aftercare: Secondary | ICD-10-CM | POA: Diagnosis not present

## 2018-04-08 DIAGNOSIS — I10 Essential (primary) hypertension: Secondary | ICD-10-CM | POA: Diagnosis not present

## 2018-04-08 DIAGNOSIS — E119 Type 2 diabetes mellitus without complications: Secondary | ICD-10-CM | POA: Diagnosis not present

## 2018-04-08 DIAGNOSIS — M48061 Spinal stenosis, lumbar region without neurogenic claudication: Secondary | ICD-10-CM | POA: Diagnosis not present

## 2018-04-10 DIAGNOSIS — I10 Essential (primary) hypertension: Secondary | ICD-10-CM | POA: Diagnosis not present

## 2018-04-10 DIAGNOSIS — M6281 Muscle weakness (generalized): Secondary | ICD-10-CM | POA: Diagnosis not present

## 2018-04-10 DIAGNOSIS — Z4789 Encounter for other orthopedic aftercare: Secondary | ICD-10-CM | POA: Diagnosis not present

## 2018-04-10 DIAGNOSIS — J441 Chronic obstructive pulmonary disease with (acute) exacerbation: Secondary | ICD-10-CM | POA: Diagnosis not present

## 2018-04-10 DIAGNOSIS — E119 Type 2 diabetes mellitus without complications: Secondary | ICD-10-CM | POA: Diagnosis not present

## 2018-04-10 DIAGNOSIS — M48061 Spinal stenosis, lumbar region without neurogenic claudication: Secondary | ICD-10-CM | POA: Diagnosis not present

## 2018-04-13 DIAGNOSIS — I10 Essential (primary) hypertension: Secondary | ICD-10-CM | POA: Diagnosis not present

## 2018-04-13 DIAGNOSIS — J441 Chronic obstructive pulmonary disease with (acute) exacerbation: Secondary | ICD-10-CM | POA: Diagnosis not present

## 2018-04-13 DIAGNOSIS — M6281 Muscle weakness (generalized): Secondary | ICD-10-CM | POA: Diagnosis not present

## 2018-04-13 DIAGNOSIS — M48061 Spinal stenosis, lumbar region without neurogenic claudication: Secondary | ICD-10-CM | POA: Diagnosis not present

## 2018-04-13 DIAGNOSIS — Z4789 Encounter for other orthopedic aftercare: Secondary | ICD-10-CM | POA: Diagnosis not present

## 2018-04-13 DIAGNOSIS — E119 Type 2 diabetes mellitus without complications: Secondary | ICD-10-CM | POA: Diagnosis not present

## 2018-04-17 DIAGNOSIS — E119 Type 2 diabetes mellitus without complications: Secondary | ICD-10-CM | POA: Diagnosis not present

## 2018-04-17 DIAGNOSIS — I10 Essential (primary) hypertension: Secondary | ICD-10-CM | POA: Diagnosis not present

## 2018-04-17 DIAGNOSIS — J441 Chronic obstructive pulmonary disease with (acute) exacerbation: Secondary | ICD-10-CM | POA: Diagnosis not present

## 2018-04-17 DIAGNOSIS — M48061 Spinal stenosis, lumbar region without neurogenic claudication: Secondary | ICD-10-CM | POA: Diagnosis not present

## 2018-04-17 DIAGNOSIS — M6281 Muscle weakness (generalized): Secondary | ICD-10-CM | POA: Diagnosis not present

## 2018-04-17 DIAGNOSIS — Z4789 Encounter for other orthopedic aftercare: Secondary | ICD-10-CM | POA: Diagnosis not present

## 2018-04-24 DIAGNOSIS — I10 Essential (primary) hypertension: Secondary | ICD-10-CM | POA: Diagnosis not present

## 2018-04-24 DIAGNOSIS — M545 Low back pain: Secondary | ICD-10-CM | POA: Diagnosis not present

## 2018-04-24 DIAGNOSIS — E782 Mixed hyperlipidemia: Secondary | ICD-10-CM | POA: Diagnosis not present

## 2018-04-24 DIAGNOSIS — E1121 Type 2 diabetes mellitus with diabetic nephropathy: Secondary | ICD-10-CM | POA: Diagnosis not present

## 2018-05-05 DIAGNOSIS — M810 Age-related osteoporosis without current pathological fracture: Secondary | ICD-10-CM | POA: Diagnosis not present

## 2018-05-05 DIAGNOSIS — M85851 Other specified disorders of bone density and structure, right thigh: Secondary | ICD-10-CM | POA: Diagnosis not present

## 2018-05-12 DIAGNOSIS — Z1211 Encounter for screening for malignant neoplasm of colon: Secondary | ICD-10-CM | POA: Diagnosis not present

## 2018-05-18 DIAGNOSIS — Z888 Allergy status to other drugs, medicaments and biological substances status: Secondary | ICD-10-CM | POA: Diagnosis not present

## 2018-05-18 DIAGNOSIS — I10 Essential (primary) hypertension: Secondary | ICD-10-CM | POA: Diagnosis not present

## 2018-05-18 DIAGNOSIS — Z7902 Long term (current) use of antithrombotics/antiplatelets: Secondary | ICD-10-CM | POA: Diagnosis not present

## 2018-05-18 DIAGNOSIS — Z96653 Presence of artificial knee joint, bilateral: Secondary | ICD-10-CM | POA: Diagnosis not present

## 2018-05-18 DIAGNOSIS — Z981 Arthrodesis status: Secondary | ICD-10-CM | POA: Diagnosis not present

## 2018-05-18 DIAGNOSIS — Z7984 Long term (current) use of oral hypoglycemic drugs: Secondary | ICD-10-CM | POA: Diagnosis not present

## 2018-05-18 DIAGNOSIS — Z79899 Other long term (current) drug therapy: Secondary | ICD-10-CM | POA: Diagnosis not present

## 2018-05-18 DIAGNOSIS — J449 Chronic obstructive pulmonary disease, unspecified: Secondary | ICD-10-CM | POA: Diagnosis not present

## 2018-05-18 DIAGNOSIS — Z1211 Encounter for screening for malignant neoplasm of colon: Secondary | ICD-10-CM | POA: Diagnosis not present

## 2018-05-18 DIAGNOSIS — E119 Type 2 diabetes mellitus without complications: Secondary | ICD-10-CM | POA: Diagnosis not present

## 2018-05-18 DIAGNOSIS — Z881 Allergy status to other antibiotic agents status: Secondary | ICD-10-CM | POA: Diagnosis not present

## 2018-05-22 DIAGNOSIS — M545 Low back pain: Secondary | ICD-10-CM | POA: Diagnosis not present

## 2018-05-22 DIAGNOSIS — I10 Essential (primary) hypertension: Secondary | ICD-10-CM | POA: Diagnosis not present

## 2018-05-22 DIAGNOSIS — E782 Mixed hyperlipidemia: Secondary | ICD-10-CM | POA: Diagnosis not present

## 2018-05-22 DIAGNOSIS — E1121 Type 2 diabetes mellitus with diabetic nephropathy: Secondary | ICD-10-CM | POA: Diagnosis not present

## 2018-05-25 DIAGNOSIS — N814 Uterovaginal prolapse, unspecified: Secondary | ICD-10-CM | POA: Diagnosis not present

## 2018-06-25 DIAGNOSIS — M545 Low back pain: Secondary | ICD-10-CM | POA: Diagnosis not present

## 2018-06-25 DIAGNOSIS — I10 Essential (primary) hypertension: Secondary | ICD-10-CM | POA: Diagnosis not present

## 2018-06-25 DIAGNOSIS — E782 Mixed hyperlipidemia: Secondary | ICD-10-CM | POA: Diagnosis not present

## 2018-06-25 DIAGNOSIS — E1121 Type 2 diabetes mellitus with diabetic nephropathy: Secondary | ICD-10-CM | POA: Diagnosis not present

## 2018-07-08 DIAGNOSIS — Z0000 Encounter for general adult medical examination without abnormal findings: Secondary | ICD-10-CM | POA: Diagnosis not present

## 2018-07-08 DIAGNOSIS — I10 Essential (primary) hypertension: Secondary | ICD-10-CM | POA: Diagnosis not present

## 2018-07-08 DIAGNOSIS — M545 Low back pain: Secondary | ICD-10-CM | POA: Diagnosis not present

## 2018-07-08 DIAGNOSIS — E782 Mixed hyperlipidemia: Secondary | ICD-10-CM | POA: Diagnosis not present

## 2018-07-08 DIAGNOSIS — E1121 Type 2 diabetes mellitus with diabetic nephropathy: Secondary | ICD-10-CM | POA: Diagnosis not present

## 2018-07-08 DIAGNOSIS — Z1389 Encounter for screening for other disorder: Secondary | ICD-10-CM | POA: Diagnosis not present

## 2018-07-23 DIAGNOSIS — E1121 Type 2 diabetes mellitus with diabetic nephropathy: Secondary | ICD-10-CM | POA: Diagnosis not present

## 2018-07-23 DIAGNOSIS — E782 Mixed hyperlipidemia: Secondary | ICD-10-CM | POA: Diagnosis not present

## 2018-07-23 DIAGNOSIS — M545 Low back pain: Secondary | ICD-10-CM | POA: Diagnosis not present

## 2018-07-23 DIAGNOSIS — I10 Essential (primary) hypertension: Secondary | ICD-10-CM | POA: Diagnosis not present

## 2018-07-24 DIAGNOSIS — M7138 Other bursal cyst, other site: Secondary | ICD-10-CM | POA: Diagnosis not present

## 2018-07-27 DIAGNOSIS — G8929 Other chronic pain: Secondary | ICD-10-CM | POA: Diagnosis not present

## 2018-07-27 DIAGNOSIS — M545 Low back pain: Secondary | ICD-10-CM | POA: Diagnosis not present

## 2018-07-27 DIAGNOSIS — E1140 Type 2 diabetes mellitus with diabetic neuropathy, unspecified: Secondary | ICD-10-CM | POA: Diagnosis not present

## 2018-07-27 DIAGNOSIS — I10 Essential (primary) hypertension: Secondary | ICD-10-CM | POA: Diagnosis not present

## 2018-07-27 DIAGNOSIS — K8020 Calculus of gallbladder without cholecystitis without obstruction: Secondary | ICD-10-CM | POA: Diagnosis not present

## 2018-07-27 DIAGNOSIS — N281 Cyst of kidney, acquired: Secondary | ICD-10-CM | POA: Diagnosis not present

## 2018-07-27 DIAGNOSIS — Z7984 Long term (current) use of oral hypoglycemic drugs: Secondary | ICD-10-CM | POA: Diagnosis not present

## 2018-07-27 DIAGNOSIS — J449 Chronic obstructive pulmonary disease, unspecified: Secondary | ICD-10-CM | POA: Diagnosis not present

## 2018-07-27 DIAGNOSIS — I700 Atherosclerosis of aorta: Secondary | ICD-10-CM | POA: Diagnosis not present

## 2018-07-27 DIAGNOSIS — Z8249 Family history of ischemic heart disease and other diseases of the circulatory system: Secondary | ICD-10-CM | POA: Diagnosis not present

## 2018-07-29 DIAGNOSIS — H6121 Impacted cerumen, right ear: Secondary | ICD-10-CM | POA: Diagnosis not present

## 2018-08-24 DIAGNOSIS — C44729 Squamous cell carcinoma of skin of left lower limb, including hip: Secondary | ICD-10-CM | POA: Diagnosis not present

## 2018-09-07 DIAGNOSIS — C44722 Squamous cell carcinoma of skin of right lower limb, including hip: Secondary | ICD-10-CM | POA: Diagnosis not present

## 2018-09-07 DIAGNOSIS — C44712 Basal cell carcinoma of skin of right lower limb, including hip: Secondary | ICD-10-CM | POA: Diagnosis not present

## 2018-09-10 DIAGNOSIS — I10 Essential (primary) hypertension: Secondary | ICD-10-CM | POA: Diagnosis not present

## 2018-09-10 DIAGNOSIS — E7849 Other hyperlipidemia: Secondary | ICD-10-CM | POA: Diagnosis not present

## 2018-09-10 DIAGNOSIS — E119 Type 2 diabetes mellitus without complications: Secondary | ICD-10-CM | POA: Diagnosis not present

## 2018-10-05 DIAGNOSIS — C44722 Squamous cell carcinoma of skin of right lower limb, including hip: Secondary | ICD-10-CM | POA: Diagnosis not present

## 2018-10-07 DIAGNOSIS — M545 Low back pain: Secondary | ICD-10-CM | POA: Diagnosis not present

## 2018-10-12 DIAGNOSIS — E7849 Other hyperlipidemia: Secondary | ICD-10-CM | POA: Diagnosis not present

## 2018-10-12 DIAGNOSIS — I10 Essential (primary) hypertension: Secondary | ICD-10-CM | POA: Diagnosis not present

## 2018-10-12 DIAGNOSIS — M545 Low back pain: Secondary | ICD-10-CM | POA: Diagnosis not present

## 2018-10-12 DIAGNOSIS — E119 Type 2 diabetes mellitus without complications: Secondary | ICD-10-CM | POA: Diagnosis not present

## 2018-10-18 DIAGNOSIS — Z8249 Family history of ischemic heart disease and other diseases of the circulatory system: Secondary | ICD-10-CM | POA: Diagnosis not present

## 2018-10-18 DIAGNOSIS — J449 Chronic obstructive pulmonary disease, unspecified: Secondary | ICD-10-CM | POA: Diagnosis not present

## 2018-10-18 DIAGNOSIS — E1140 Type 2 diabetes mellitus with diabetic neuropathy, unspecified: Secondary | ICD-10-CM | POA: Diagnosis not present

## 2018-10-18 DIAGNOSIS — M79605 Pain in left leg: Secondary | ICD-10-CM | POA: Diagnosis not present

## 2018-10-18 DIAGNOSIS — I10 Essential (primary) hypertension: Secondary | ICD-10-CM | POA: Diagnosis not present

## 2018-10-18 DIAGNOSIS — Z79899 Other long term (current) drug therapy: Secondary | ICD-10-CM | POA: Diagnosis not present

## 2018-10-18 DIAGNOSIS — Z85828 Personal history of other malignant neoplasm of skin: Secondary | ICD-10-CM | POA: Diagnosis not present

## 2018-10-18 DIAGNOSIS — G8929 Other chronic pain: Secondary | ICD-10-CM | POA: Diagnosis not present

## 2018-10-18 DIAGNOSIS — Z7984 Long term (current) use of oral hypoglycemic drugs: Secondary | ICD-10-CM | POA: Diagnosis not present

## 2018-10-18 DIAGNOSIS — Z7982 Long term (current) use of aspirin: Secondary | ICD-10-CM | POA: Diagnosis not present

## 2018-10-18 DIAGNOSIS — M549 Dorsalgia, unspecified: Secondary | ICD-10-CM | POA: Diagnosis not present

## 2018-10-18 DIAGNOSIS — M5432 Sciatica, left side: Secondary | ICD-10-CM | POA: Diagnosis not present

## 2018-10-18 DIAGNOSIS — R531 Weakness: Secondary | ICD-10-CM | POA: Diagnosis not present

## 2018-11-03 ENCOUNTER — Emergency Department (HOSPITAL_COMMUNITY)
Admission: EM | Admit: 2018-11-03 | Discharge: 2018-11-03 | Disposition: A | Discharge status: Home / Self Care | Payer: Medicare Other | Attending: Emergency Medicine | Admitting: Emergency Medicine

## 2018-11-03 ENCOUNTER — Other Ambulatory Visit: Payer: Self-pay

## 2018-11-03 ENCOUNTER — Emergency Department (HOSPITAL_COMMUNITY): Payer: Medicare Other

## 2018-11-03 ENCOUNTER — Encounter (HOSPITAL_COMMUNITY): Payer: Self-pay | Admitting: Emergency Medicine

## 2018-11-03 DIAGNOSIS — I10 Essential (primary) hypertension: Secondary | ICD-10-CM | POA: Insufficient documentation

## 2018-11-03 DIAGNOSIS — E119 Type 2 diabetes mellitus without complications: Secondary | ICD-10-CM | POA: Diagnosis not present

## 2018-11-03 DIAGNOSIS — W19XXXA Unspecified fall, initial encounter: Secondary | ICD-10-CM | POA: Insufficient documentation

## 2018-11-03 DIAGNOSIS — Z8673 Personal history of transient ischemic attack (TIA), and cerebral infarction without residual deficits: Secondary | ICD-10-CM | POA: Insufficient documentation

## 2018-11-03 DIAGNOSIS — M79605 Pain in left leg: Secondary | ICD-10-CM

## 2018-11-03 DIAGNOSIS — J449 Chronic obstructive pulmonary disease, unspecified: Secondary | ICD-10-CM | POA: Diagnosis not present

## 2018-11-03 DIAGNOSIS — M1612 Unilateral primary osteoarthritis, left hip: Secondary | ICD-10-CM | POA: Diagnosis not present

## 2018-11-03 DIAGNOSIS — M25562 Pain in left knee: Secondary | ICD-10-CM | POA: Diagnosis not present

## 2018-11-03 MED ORDER — DICLOFENAC SODIUM 1 % TD GEL: 2.0000 g | Freq: Four times a day (QID) | TRANSDERMAL | 0 refills | Status: DC | PRN

## 2018-11-03 NOTE — ED Triage Notes (Signed)
Pt suffered near fall on Wednesday and caught self but felt an immediate pain in her L leg from back of thigh to knee. Pt states pain has not gotten any better despite taking OTC pain meds.

## 2018-11-03 NOTE — ED Provider Notes (Signed)
Emergency Department Provider Note   I have reviewed the triage vital signs and the nursing notes.   HISTORY  Chief Complaint Leg Pain   HPI [name] is a 78 y.o. female with PMH of AS, COPD, HLD, and DM presents to the emergency department for evaluation of left leg pain.  Patient states that she nearly fell 6 days ago.  She did not completely hit the ground but felt sudden pain in her left hamstring.  She denies any lower back pain.  No numbness, weakness, or tingling in the leg.  She has been ambulatory with her cane but has constant pain in the posterior thigh.  She has been trying over-the-counter medications with no relief. No radiation of symptoms or other modifying factors.   Past Medical History:  Diagnosis Date   Aortic stenosis    Carpal tunnel syndrome, right    COPD (chronic obstructive pulmonary disease) (HCC)    DJD (degenerative joint disease)    Eczematous dermatitis    Essential hypertension    Gallstones    Hyperlipidemia    Osteoarthritis    Rotator cuff syndrome    Spinal stenosis of lumbar region    Squamous cell carcinoma    Type 2 diabetes mellitus ([place])     Patient Active Problem List   Diagnosis Date Noted   TIA (transient ischemic attack) 11/04/2012   Postop Transfusion history 10/03/2011   Septic arthritis of knee, right ([place]) 10/02/2011   Acute blood loss anemia 04/22/2011   CARCINOMA, SQUAMOUS CELL 09/26/2008   DYSLIPIDEMIA 09/26/2008   HYPERTENSION 09/26/2008   DEGENERATIVE JOINT DISEASE, GENERALIZED 09/26/2008   OSTEOARTHRITIS 09/26/2008   ROTATOR CUFF SYNDROME 09/26/2008   DIABETES MELLITUS, BORDERLINE 09/26/2008   DYSPNEA 09/23/2008    Past Surgical History:  Procedure Laterality Date   BACK SURGERY     CATARACT EXTRACTION  2011   bilateral   JOINT REPLACEMENT  09-27-11   '04/ right with resection for infection, now antibiotic spacer planned   JOINT REPLACEMENT     not replaced- only  surgery 2007/2011/2012.   LUMBAR LAMINECTOMY/DECOMPRESSION MICRODISCECTOMY  04/19/2011   Procedure: LUMBAR LAMINECTOMY/DECOMPRESSION MICRODISCECTOMY;  Surgeon: Tobi Bastos;  Location: [facility];  Service: Orthopedics;  Laterality: N/A;  Central decompression Lumbar two to lumbar three and Three to Lumbar Four    (xray)   TOTAL KNEE REVISION  12/04/2011   Procedure: TOTAL KNEE REVISION;  Surgeon: Gearlean Alf, MD;  Location: [facility];  Service: Orthopedics;  Laterality: Right;  Reimplantation of a Right Total Knee Arthroplasty   TUBAL LIGATION      Allergies Ciprofloxacin  Family History  Problem Relation Age of Onset   Heart attack Mother    Cancer Mother        cholangiocarcinoma   Heart disease Father    Heart attack Brother    Heart attack Brother    Heart attack Brother    Coronary artery disease Other    Diabetes Other     Social History Social History   Tobacco Use   Smoking status: Never Smoker   Smokeless tobacco: Never Used  Substance Use Topics   Alcohol use: No   Drug use: No    Review of Systems  Constitutional: No fever/chills Eyes: No visual changes. ENT: No sore throat. Cardiovascular: Denies chest pain. Respiratory: Denies shortness of breath. Gastrointestinal: No abdominal pain.  No nausea, no vomiting.  No diarrhea.  No constipation. Genitourinary: Negative for dysuria. Musculoskeletal: Positive left leg  pain.  Skin: Negative for rash. Neurological: Negative for headaches, focal weakness or numbness.  10-point ROS otherwise negative.  ____________________________________________   PHYSICAL EXAM:  VITAL SIGNS: ED Triage Vitals  Enc Vitals Group     BP 11/03/18 2058 (!) 156/88     Pulse Rate 11/03/18 2058 77     Resp 11/03/18 2058 20     Temp 11/03/18 2058 98.4 F (36.9 C)     Temp Source 11/03/18 2058 Oral     SpO2 11/03/18 2058 100 %     Weight 11/03/18 2059 183 lb (83 kg)     Height 11/03/18 2059 5\' 5"  (1.651 m)      Pain Score 11/03/18 2058 8   Constitutional: Alert and oriented. Well appearing and in no acute distress. Eyes: Conjunctivae are normal.  Head: Atraumatic. Nose: No congestion/rhinnorhea. Mouth/Throat: Mucous membranes are moist.  Neck: No stridor.   Cardiovascular: Normal rate, regular rhythm. Good peripheral circulation in the LE. Grossly normal heart sounds.   Respiratory: Normal respiratory effort. Gastrointestinal: No distention.  Musculoskeletal: Mild tenderness to palpation along the left hamstring. Normal ROM of the left hip and knee. No ankle tenderness or swelling.  Neurologic:  Normal speech and language. No gross focal neurologic deficits are appreciated.  Skin:  Skin is warm, dry and intact. No rash noted.  ____________________________________________  RADIOLOGY  Dg Knee 2 Views Left  Result Date: 11/03/2018 CLINICAL DATA:  Initial evaluation for acute pain, no known injury. EXAM: LEFT KNEE - 1-2 VIEW COMPARISON:  None available. FINDINGS: No acute fracture dislocation. No joint effusion. Left total knee arthroplasty in place without adverse features. Bones are mildly osteopenic. No soft tissue abnormality. Scattered vascular calcifications noted. IMPRESSION: 1. No acute osseous abnormality. 2. Left total knee arthroplasty in place without adverse features. Electronically Signed   By: Jeannine Boga M.D.   On: 11/03/2018 21:47   Dg Hip Unilat W Or Wo Pelvis 2-3 Views Left  Result Date: 11/03/2018 CLINICAL DATA:  Initial evaluation for acute left leg pain. No trauma. EXAM: DG HIP (WITH OR WITHOUT PELVIS) 2-3V LEFT COMPARISON:  Prior radiograph from 10/18/2018. FINDINGS: No acute fracture dislocation. Bony pelvis intact. Femoral heads in normal alignment within the acetabula. Femoral head heights maintained. Mild osteoarthritic changes present about the hips bilaterally. Postsurgical changes partially visualized within the lower lumbar spine. Asymmetric sclerosis noted about  the right SI joint. No visible soft tissue abnormality. IMPRESSION: 1. No acute osseous abnormality. 2. Mild osteoarthritic changes about the left hip. Electronically Signed   By: Jeannine Boga M.D.   On: 11/03/2018 21:46    ____________________________________________   PROCEDURES  Procedure(s) performed:   Procedures  None  ____________________________________________   INITIAL IMPRESSION / ASSESSMENT AND PLAN / ED COURSE  Pertinent labs & imaging results that were available during my care of the patient were reviewed by me and considered in my medical decision making (see chart for details).   Patient presents to the emergency department with pain in the left hamstring.  Symptoms have been ongoing for 6 days.  She has normal range of motion of the joints in her left leg.  Normal neurovascular exam. No back pain. No radicular symptoms. Plan for plain films of the left hip and knee. Suspect MSK strain clinically.   10:20 PM  Plain films reviewed with no bony abnormality other than arthritis.  Nothing acute. Plan for Voltaren gel, warm compress, and PCP follow-up. ____________________________________________  FINAL CLINICAL IMPRESSION(S) / ED DIAGNOSES  Final  diagnoses:  Left leg pain    NEW OUTPATIENT MEDICATIONS STARTED DURING THIS VISIT:  New Prescriptions   DICLOFENAC SODIUM (VOLTAREN) 1 % GEL    Apply 2 g topically 4 (four) times daily as needed.    Note:  This document was prepared using Dragon voice recognition software and may include unintentional dictation errors.  Nanda Quinton, MD Emergency Medicine    Jontavious Commons, Wonda Old[facility], MD 11/03/18 2220

## 2018-11-03 NOTE — Discharge Instructions (Signed)

## 2018-11-11 DIAGNOSIS — E7849 Other hyperlipidemia: Secondary | ICD-10-CM | POA: Diagnosis not present

## 2018-11-11 DIAGNOSIS — E119 Type 2 diabetes mellitus without complications: Secondary | ICD-10-CM | POA: Diagnosis not present

## 2018-11-11 DIAGNOSIS — I10 Essential (primary) hypertension: Secondary | ICD-10-CM | POA: Diagnosis not present

## 2018-11-11 DIAGNOSIS — M545 Low back pain: Secondary | ICD-10-CM | POA: Diagnosis not present

## 2018-11-23 DIAGNOSIS — M5126 Other intervertebral disc displacement, lumbar region: Secondary | ICD-10-CM | POA: Diagnosis not present

## 2018-11-23 DIAGNOSIS — M545 Low back pain: Secondary | ICD-10-CM | POA: Diagnosis not present

## 2018-11-23 DIAGNOSIS — M5416 Radiculopathy, lumbar region: Secondary | ICD-10-CM | POA: Diagnosis not present

## 2018-11-23 DIAGNOSIS — M4317 Spondylolisthesis, lumbosacral region: Secondary | ICD-10-CM | POA: Diagnosis not present

## 2018-11-24 ENCOUNTER — Other Ambulatory Visit: Payer: Self-pay

## 2018-11-24 NOTE — Patient Outreach (Signed)
[place] [facility]) Care Management  11/24/2018  STEPHNIE PARLIER 1940-09-04 [id number]   Medication Adherence call to Mrs. Learta Codding Hippa Identifiers Verify spoke with patient she is due on Metformin 500 mg patient explain she is taking 1 tablet 2 times daily and has already pick up from pharmacy patient has plenty at this time.Mrs. Romack is showing past due under [place].   [place] Management Direct Dial [phone]  Fax [phone] [email or web address]

## 2018-12-10 DIAGNOSIS — M5116 Intervertebral disc disorders with radiculopathy, lumbar region: Secondary | ICD-10-CM | POA: Diagnosis not present

## 2018-12-10 DIAGNOSIS — Z981 Arthrodesis status: Secondary | ICD-10-CM | POA: Diagnosis not present

## 2018-12-10 DIAGNOSIS — M5126 Other intervertebral disc displacement, lumbar region: Secondary | ICD-10-CM | POA: Diagnosis not present

## 2018-12-10 DIAGNOSIS — M4316 Spondylolisthesis, lumbar region: Secondary | ICD-10-CM | POA: Diagnosis not present

## 2018-12-15 DIAGNOSIS — I10 Essential (primary) hypertension: Secondary | ICD-10-CM | POA: Diagnosis not present

## 2018-12-15 DIAGNOSIS — M545 Low back pain: Secondary | ICD-10-CM | POA: Diagnosis not present

## 2018-12-15 DIAGNOSIS — E119 Type 2 diabetes mellitus without complications: Secondary | ICD-10-CM | POA: Diagnosis not present

## 2018-12-15 DIAGNOSIS — E7849 Other hyperlipidemia: Secondary | ICD-10-CM | POA: Diagnosis not present

## 2018-12-25 DIAGNOSIS — Z885 Allergy status to narcotic agent status: Secondary | ICD-10-CM | POA: Diagnosis not present

## 2018-12-25 DIAGNOSIS — K219 Gastro-esophageal reflux disease without esophagitis: Secondary | ICD-10-CM | POA: Diagnosis not present

## 2018-12-25 DIAGNOSIS — Z8249 Family history of ischemic heart disease and other diseases of the circulatory system: Secondary | ICD-10-CM | POA: Diagnosis not present

## 2018-12-25 DIAGNOSIS — J449 Chronic obstructive pulmonary disease, unspecified: Secondary | ICD-10-CM | POA: Diagnosis not present

## 2018-12-25 DIAGNOSIS — I10 Essential (primary) hypertension: Secondary | ICD-10-CM | POA: Diagnosis not present

## 2018-12-25 DIAGNOSIS — E7800 Pure hypercholesterolemia, unspecified: Secondary | ICD-10-CM | POA: Diagnosis not present

## 2018-12-25 DIAGNOSIS — R609 Edema, unspecified: Secondary | ICD-10-CM | POA: Diagnosis not present

## 2018-12-25 DIAGNOSIS — Z79899 Other long term (current) drug therapy: Secondary | ICD-10-CM | POA: Diagnosis not present

## 2018-12-25 DIAGNOSIS — E1140 Type 2 diabetes mellitus with diabetic neuropathy, unspecified: Secondary | ICD-10-CM | POA: Diagnosis not present

## 2018-12-25 DIAGNOSIS — M7989 Other specified soft tissue disorders: Secondary | ICD-10-CM | POA: Diagnosis not present

## 2018-12-25 DIAGNOSIS — Z7984 Long term (current) use of oral hypoglycemic drugs: Secondary | ICD-10-CM | POA: Diagnosis not present

## 2018-12-25 DIAGNOSIS — Z8673 Personal history of transient ischemic attack (TIA), and cerebral infarction without residual deficits: Secondary | ICD-10-CM | POA: Diagnosis not present

## 2018-12-25 DIAGNOSIS — R600 Localized edema: Secondary | ICD-10-CM | POA: Diagnosis not present

## 2018-12-25 DIAGNOSIS — Z882 Allergy status to sulfonamides status: Secondary | ICD-10-CM | POA: Diagnosis not present

## 2018-12-25 DIAGNOSIS — M1990 Unspecified osteoarthritis, unspecified site: Secondary | ICD-10-CM | POA: Diagnosis not present

## 2019-01-04 DIAGNOSIS — M48061 Spinal stenosis, lumbar region without neurogenic claudication: Secondary | ICD-10-CM | POA: Diagnosis not present

## 2019-01-04 DIAGNOSIS — S32009K Unspecified fracture of unspecified lumbar vertebra, subsequent encounter for fracture with nonunion: Secondary | ICD-10-CM | POA: Diagnosis not present

## 2019-01-04 DIAGNOSIS — M4317 Spondylolisthesis, lumbosacral region: Secondary | ICD-10-CM | POA: Diagnosis not present

## 2019-01-04 DIAGNOSIS — M5416 Radiculopathy, lumbar region: Secondary | ICD-10-CM | POA: Diagnosis not present

## 2019-01-04 DIAGNOSIS — M545 Low back pain: Secondary | ICD-10-CM | POA: Diagnosis not present

## 2019-01-06 DIAGNOSIS — M545 Low back pain: Secondary | ICD-10-CM | POA: Diagnosis not present

## 2019-01-08 ENCOUNTER — Other Ambulatory Visit: Payer: Self-pay

## 2019-01-08 NOTE — Patient Outreach (Signed)
[place] [facility]) Care Management  01/08/2019  OLUWATONI ROTUNNO January 13, 1941 [id number]   Medication Adherence call to Mrs. Burton Compliant Voice message left with a call back number. Mrs. Prophete is showing past due on Benazepril/Hctz 20/25 mg under [facility] ins.   [place] Management Direct Dial [phone]  Fax [phone] [email or web address]

## 2019-01-13 DIAGNOSIS — M545 Low back pain: Secondary | ICD-10-CM | POA: Diagnosis not present

## 2019-01-13 DIAGNOSIS — I10 Essential (primary) hypertension: Secondary | ICD-10-CM | POA: Diagnosis not present

## 2019-01-13 DIAGNOSIS — E119 Type 2 diabetes mellitus without complications: Secondary | ICD-10-CM | POA: Diagnosis not present

## 2019-01-13 DIAGNOSIS — E785 Hyperlipidemia, unspecified: Secondary | ICD-10-CM | POA: Diagnosis not present

## 2019-01-20 ENCOUNTER — Other Ambulatory Visit: Payer: Self-pay

## 2019-01-20 NOTE — Patient Outreach (Signed)
[place] [facility]) Care Management  01/20/2019  KORIE STREAT 05-03-1941 [id number]   Medication Adherence call to Mrs. Learta Codding Hippa Identifiers Verify spoke with patient she is past due on Benazepril/Hctz 20/25 mg patient explain she is still taking 1 tablet daily patient ask if we can call [facility] to order this Jeff Davis will have it ready for patient to pick up.Mrs. Reimers is showing past due under [place].   [place] Management Direct Dial [phone]  Fax [phone] [email or web address]

## 2019-02-10 DIAGNOSIS — E782 Mixed hyperlipidemia: Secondary | ICD-10-CM | POA: Diagnosis not present

## 2019-02-10 DIAGNOSIS — E119 Type 2 diabetes mellitus without complications: Secondary | ICD-10-CM | POA: Diagnosis not present

## 2019-02-10 DIAGNOSIS — M545 Low back pain: Secondary | ICD-10-CM | POA: Diagnosis not present

## 2019-02-10 DIAGNOSIS — I10 Essential (primary) hypertension: Secondary | ICD-10-CM | POA: Diagnosis not present

## 2019-02-11 DIAGNOSIS — M4317 Spondylolisthesis, lumbosacral region: Secondary | ICD-10-CM | POA: Diagnosis not present

## 2019-02-11 DIAGNOSIS — S32009K Unspecified fracture of unspecified lumbar vertebra, subsequent encounter for fracture with nonunion: Secondary | ICD-10-CM | POA: Diagnosis not present

## 2019-03-08 DIAGNOSIS — M48061 Spinal stenosis, lumbar region without neurogenic claudication: Secondary | ICD-10-CM | POA: Diagnosis not present

## 2019-03-08 DIAGNOSIS — M5416 Radiculopathy, lumbar region: Secondary | ICD-10-CM | POA: Diagnosis not present

## 2019-03-08 DIAGNOSIS — M545 Low back pain: Secondary | ICD-10-CM | POA: Diagnosis not present

## 2019-03-08 DIAGNOSIS — M4317 Spondylolisthesis, lumbosacral region: Secondary | ICD-10-CM | POA: Diagnosis not present

## 2019-03-08 DIAGNOSIS — S32009K Unspecified fracture of unspecified lumbar vertebra, subsequent encounter for fracture with nonunion: Secondary | ICD-10-CM | POA: Diagnosis not present

## 2019-03-09 DIAGNOSIS — M545 Low back pain: Secondary | ICD-10-CM | POA: Diagnosis not present

## 2019-03-09 DIAGNOSIS — Z1389 Encounter for screening for other disorder: Secondary | ICD-10-CM | POA: Diagnosis not present

## 2019-03-09 DIAGNOSIS — I10 Essential (primary) hypertension: Secondary | ICD-10-CM | POA: Diagnosis not present

## 2019-03-09 DIAGNOSIS — Z0000 Encounter for general adult medical examination without abnormal findings: Secondary | ICD-10-CM | POA: Diagnosis not present

## 2019-03-09 DIAGNOSIS — E119 Type 2 diabetes mellitus without complications: Secondary | ICD-10-CM | POA: Diagnosis not present

## 2019-03-09 DIAGNOSIS — E782 Mixed hyperlipidemia: Secondary | ICD-10-CM | POA: Diagnosis not present

## 2019-03-09 DIAGNOSIS — H81399 Other peripheral vertigo, unspecified ear: Secondary | ICD-10-CM | POA: Diagnosis not present

## 2019-03-26 DIAGNOSIS — I10 Essential (primary) hypertension: Secondary | ICD-10-CM | POA: Diagnosis not present

## 2019-03-26 DIAGNOSIS — M545 Low back pain: Secondary | ICD-10-CM | POA: Diagnosis not present

## 2019-03-26 DIAGNOSIS — E119 Type 2 diabetes mellitus without complications: Secondary | ICD-10-CM | POA: Diagnosis not present

## 2019-04-01 DIAGNOSIS — M5416 Radiculopathy, lumbar region: Secondary | ICD-10-CM | POA: Diagnosis not present

## 2019-04-26 DIAGNOSIS — M542 Cervicalgia: Secondary | ICD-10-CM | POA: Diagnosis not present

## 2019-04-28 DIAGNOSIS — M545 Low back pain: Secondary | ICD-10-CM | POA: Diagnosis not present

## 2019-05-03 ENCOUNTER — Other Ambulatory Visit: Payer: Self-pay

## 2019-05-03 DIAGNOSIS — E7849 Other hyperlipidemia: Secondary | ICD-10-CM | POA: Diagnosis not present

## 2019-05-03 DIAGNOSIS — I10 Essential (primary) hypertension: Secondary | ICD-10-CM | POA: Diagnosis not present

## 2019-05-03 DIAGNOSIS — E119 Type 2 diabetes mellitus without complications: Secondary | ICD-10-CM | POA: Diagnosis not present

## 2019-05-03 NOTE — Patient Outreach (Signed)
[place] [facility]) Care Management  05/03/2019  LAKEBA PROSS 03/24/1941 [id number]   Medication Adherence call to Mrs. Learta Codding Hippa Identifiers Verify spoke with patient she is past due on Benazepril /Hctz,patient explain she has medication at this time and will order when due,patient explain she takes 1 tablet daily.Mrs. Roa is showing past due under [place].   [place] Management Direct Dial [phone]  Fax [phone] [email or web address]

## 2019-05-10 DIAGNOSIS — Z0000 Encounter for general adult medical examination without abnormal findings: Secondary | ICD-10-CM | POA: Diagnosis not present

## 2019-05-10 DIAGNOSIS — E7849 Other hyperlipidemia: Secondary | ICD-10-CM | POA: Diagnosis not present

## 2019-05-10 DIAGNOSIS — G622 Polyneuropathy due to other toxic agents: Secondary | ICD-10-CM | POA: Diagnosis not present

## 2019-05-13 DIAGNOSIS — K219 Gastro-esophageal reflux disease without esophagitis: Secondary | ICD-10-CM | POA: Diagnosis not present

## 2019-05-13 DIAGNOSIS — S0232XA Fracture of orbital floor, left side, initial encounter for closed fracture: Secondary | ICD-10-CM | POA: Diagnosis not present

## 2019-05-13 DIAGNOSIS — J449 Chronic obstructive pulmonary disease, unspecified: Secondary | ICD-10-CM | POA: Diagnosis not present

## 2019-05-13 DIAGNOSIS — W06XXXA Fall from bed, initial encounter: Secondary | ICD-10-CM | POA: Diagnosis not present

## 2019-05-13 DIAGNOSIS — Z79899 Other long term (current) drug therapy: Secondary | ICD-10-CM | POA: Diagnosis not present

## 2019-05-13 DIAGNOSIS — E119 Type 2 diabetes mellitus without complications: Secondary | ICD-10-CM | POA: Diagnosis not present

## 2019-05-13 DIAGNOSIS — I10 Essential (primary) hypertension: Secondary | ICD-10-CM | POA: Diagnosis not present

## 2019-05-13 DIAGNOSIS — Z7984 Long term (current) use of oral hypoglycemic drugs: Secondary | ICD-10-CM | POA: Diagnosis not present

## 2019-05-13 DIAGNOSIS — H1131 Conjunctival hemorrhage, right eye: Secondary | ICD-10-CM | POA: Diagnosis not present

## 2019-05-13 DIAGNOSIS — S0231XA Fracture of orbital floor, right side, initial encounter for closed fracture: Secondary | ICD-10-CM | POA: Diagnosis not present

## 2019-05-13 DIAGNOSIS — S00211A Abrasion of right eyelid and periocular area, initial encounter: Secondary | ICD-10-CM | POA: Diagnosis not present

## 2019-05-13 DIAGNOSIS — R519 Headache, unspecified: Secondary | ICD-10-CM | POA: Diagnosis not present

## 2019-05-14 DIAGNOSIS — Z888 Allergy status to other drugs, medicaments and biological substances status: Secondary | ICD-10-CM | POA: Diagnosis not present

## 2019-05-14 DIAGNOSIS — G459 Transient cerebral ischemic attack, unspecified: Secondary | ICD-10-CM | POA: Diagnosis not present

## 2019-05-14 DIAGNOSIS — I10 Essential (primary) hypertension: Secondary | ICD-10-CM | POA: Diagnosis not present

## 2019-05-14 DIAGNOSIS — Z780 Asymptomatic menopausal state: Secondary | ICD-10-CM | POA: Diagnosis not present

## 2019-05-14 DIAGNOSIS — Z79899 Other long term (current) drug therapy: Secondary | ICD-10-CM | POA: Diagnosis not present

## 2019-05-14 DIAGNOSIS — R296 Repeated falls: Secondary | ICD-10-CM | POA: Diagnosis not present

## 2019-05-14 DIAGNOSIS — Z9181 History of falling: Secondary | ICD-10-CM | POA: Diagnosis not present

## 2019-05-14 DIAGNOSIS — E119 Type 2 diabetes mellitus without complications: Secondary | ICD-10-CM | POA: Diagnosis not present

## 2019-05-14 DIAGNOSIS — Z881 Allergy status to other antibiotic agents status: Secondary | ICD-10-CM | POA: Diagnosis not present

## 2019-05-14 DIAGNOSIS — M6281 Muscle weakness (generalized): Secondary | ICD-10-CM | POA: Diagnosis not present

## 2019-05-14 DIAGNOSIS — Z7902 Long term (current) use of antithrombotics/antiplatelets: Secondary | ICD-10-CM | POA: Diagnosis not present

## 2019-05-14 DIAGNOSIS — Z87891 Personal history of nicotine dependence: Secondary | ICD-10-CM | POA: Diagnosis not present

## 2019-05-14 DIAGNOSIS — Z886 Allergy status to analgesic agent status: Secondary | ICD-10-CM | POA: Diagnosis not present

## 2019-05-14 DIAGNOSIS — R42 Dizziness and giddiness: Secondary | ICD-10-CM | POA: Diagnosis not present

## 2019-05-14 DIAGNOSIS — Z96651 Presence of right artificial knee joint: Secondary | ICD-10-CM | POA: Diagnosis not present

## 2019-05-14 DIAGNOSIS — M4800 Spinal stenosis, site unspecified: Secondary | ICD-10-CM | POA: Diagnosis not present

## 2019-05-14 DIAGNOSIS — E860 Dehydration: Secondary | ICD-10-CM | POA: Diagnosis not present

## 2019-05-14 DIAGNOSIS — R262 Difficulty in walking, not elsewhere classified: Secondary | ICD-10-CM | POA: Diagnosis not present

## 2019-05-14 DIAGNOSIS — Z882 Allergy status to sulfonamides status: Secondary | ICD-10-CM | POA: Diagnosis not present

## 2019-05-14 DIAGNOSIS — R0602 Shortness of breath: Secondary | ICD-10-CM | POA: Diagnosis not present

## 2019-05-14 DIAGNOSIS — S0231XA Fracture of orbital floor, right side, initial encounter for closed fracture: Secondary | ICD-10-CM | POA: Diagnosis not present

## 2019-05-14 DIAGNOSIS — M170 Bilateral primary osteoarthritis of knee: Secondary | ICD-10-CM | POA: Diagnosis not present

## 2019-05-14 DIAGNOSIS — Z981 Arthrodesis status: Secondary | ICD-10-CM | POA: Diagnosis not present

## 2019-05-14 DIAGNOSIS — R260 Ataxic gait: Secondary | ICD-10-CM | POA: Diagnosis not present

## 2019-05-14 DIAGNOSIS — Z7984 Long term (current) use of oral hypoglycemic drugs: Secondary | ICD-10-CM | POA: Diagnosis not present

## 2019-05-15 DIAGNOSIS — M170 Bilateral primary osteoarthritis of knee: Secondary | ICD-10-CM | POA: Diagnosis not present

## 2019-05-15 DIAGNOSIS — M25562 Pain in left knee: Secondary | ICD-10-CM | POA: Diagnosis not present

## 2019-05-15 DIAGNOSIS — Z882 Allergy status to sulfonamides status: Secondary | ICD-10-CM | POA: Diagnosis not present

## 2019-05-15 DIAGNOSIS — Z471 Aftercare following joint replacement surgery: Secondary | ICD-10-CM | POA: Diagnosis not present

## 2019-05-15 DIAGNOSIS — Z780 Asymptomatic menopausal state: Secondary | ICD-10-CM | POA: Diagnosis not present

## 2019-05-15 DIAGNOSIS — Z888 Allergy status to other drugs, medicaments and biological substances status: Secondary | ICD-10-CM | POA: Diagnosis not present

## 2019-05-15 DIAGNOSIS — R262 Difficulty in walking, not elsewhere classified: Secondary | ICD-10-CM | POA: Diagnosis not present

## 2019-05-15 DIAGNOSIS — G459 Transient cerebral ischemic attack, unspecified: Secondary | ICD-10-CM | POA: Diagnosis not present

## 2019-05-15 DIAGNOSIS — Z79899 Other long term (current) drug therapy: Secondary | ICD-10-CM | POA: Diagnosis not present

## 2019-05-15 DIAGNOSIS — R260 Ataxic gait: Secondary | ICD-10-CM | POA: Diagnosis not present

## 2019-05-15 DIAGNOSIS — Z7902 Long term (current) use of antithrombotics/antiplatelets: Secondary | ICD-10-CM | POA: Diagnosis not present

## 2019-05-15 DIAGNOSIS — Z7984 Long term (current) use of oral hypoglycemic drugs: Secondary | ICD-10-CM | POA: Diagnosis not present

## 2019-05-15 DIAGNOSIS — Z881 Allergy status to other antibiotic agents status: Secondary | ICD-10-CM | POA: Diagnosis not present

## 2019-05-15 DIAGNOSIS — Z96652 Presence of left artificial knee joint: Secondary | ICD-10-CM | POA: Diagnosis not present

## 2019-05-15 DIAGNOSIS — M6281 Muscle weakness (generalized): Secondary | ICD-10-CM | POA: Diagnosis not present

## 2019-05-15 DIAGNOSIS — Z981 Arthrodesis status: Secondary | ICD-10-CM | POA: Diagnosis not present

## 2019-05-15 DIAGNOSIS — M4800 Spinal stenosis, site unspecified: Secondary | ICD-10-CM | POA: Diagnosis not present

## 2019-05-15 DIAGNOSIS — E119 Type 2 diabetes mellitus without complications: Secondary | ICD-10-CM | POA: Diagnosis not present

## 2019-05-15 DIAGNOSIS — Z96651 Presence of right artificial knee joint: Secondary | ICD-10-CM | POA: Diagnosis not present

## 2019-05-15 DIAGNOSIS — E860 Dehydration: Secondary | ICD-10-CM | POA: Diagnosis not present

## 2019-05-15 DIAGNOSIS — Z9181 History of falling: Secondary | ICD-10-CM | POA: Diagnosis not present

## 2019-05-15 DIAGNOSIS — Z886 Allergy status to analgesic agent status: Secondary | ICD-10-CM | POA: Diagnosis not present

## 2019-05-15 DIAGNOSIS — Z87891 Personal history of nicotine dependence: Secondary | ICD-10-CM | POA: Diagnosis not present

## 2019-05-15 DIAGNOSIS — I10 Essential (primary) hypertension: Secondary | ICD-10-CM | POA: Diagnosis not present

## 2019-05-16 DIAGNOSIS — Z888 Allergy status to other drugs, medicaments and biological substances status: Secondary | ICD-10-CM | POA: Diagnosis not present

## 2019-05-16 DIAGNOSIS — Z87891 Personal history of nicotine dependence: Secondary | ICD-10-CM | POA: Diagnosis not present

## 2019-05-16 DIAGNOSIS — R260 Ataxic gait: Secondary | ICD-10-CM | POA: Diagnosis not present

## 2019-05-16 DIAGNOSIS — Z886 Allergy status to analgesic agent status: Secondary | ICD-10-CM | POA: Diagnosis not present

## 2019-05-16 DIAGNOSIS — Z882 Allergy status to sulfonamides status: Secondary | ICD-10-CM | POA: Diagnosis not present

## 2019-05-16 DIAGNOSIS — Z881 Allergy status to other antibiotic agents status: Secondary | ICD-10-CM | POA: Diagnosis not present

## 2019-05-16 DIAGNOSIS — Z7902 Long term (current) use of antithrombotics/antiplatelets: Secondary | ICD-10-CM | POA: Diagnosis not present

## 2019-05-16 DIAGNOSIS — E119 Type 2 diabetes mellitus without complications: Secondary | ICD-10-CM | POA: Diagnosis not present

## 2019-05-16 DIAGNOSIS — Z9181 History of falling: Secondary | ICD-10-CM | POA: Diagnosis not present

## 2019-05-16 DIAGNOSIS — M170 Bilateral primary osteoarthritis of knee: Secondary | ICD-10-CM | POA: Diagnosis not present

## 2019-05-16 DIAGNOSIS — Z7984 Long term (current) use of oral hypoglycemic drugs: Secondary | ICD-10-CM | POA: Diagnosis not present

## 2019-05-16 DIAGNOSIS — Z79899 Other long term (current) drug therapy: Secondary | ICD-10-CM | POA: Diagnosis not present

## 2019-05-16 DIAGNOSIS — Z981 Arthrodesis status: Secondary | ICD-10-CM | POA: Diagnosis not present

## 2019-05-16 DIAGNOSIS — I10 Essential (primary) hypertension: Secondary | ICD-10-CM | POA: Diagnosis not present

## 2019-05-16 DIAGNOSIS — Z96651 Presence of right artificial knee joint: Secondary | ICD-10-CM | POA: Diagnosis not present

## 2019-05-16 DIAGNOSIS — M6281 Muscle weakness (generalized): Secondary | ICD-10-CM | POA: Diagnosis not present

## 2019-05-16 DIAGNOSIS — G459 Transient cerebral ischemic attack, unspecified: Secondary | ICD-10-CM | POA: Diagnosis not present

## 2019-05-16 DIAGNOSIS — Z780 Asymptomatic menopausal state: Secondary | ICD-10-CM | POA: Diagnosis not present

## 2019-05-16 DIAGNOSIS — E860 Dehydration: Secondary | ICD-10-CM | POA: Diagnosis not present

## 2019-05-16 DIAGNOSIS — R262 Difficulty in walking, not elsewhere classified: Secondary | ICD-10-CM | POA: Diagnosis not present

## 2019-05-16 DIAGNOSIS — M4800 Spinal stenosis, site unspecified: Secondary | ICD-10-CM | POA: Diagnosis not present

## 2019-05-17 DIAGNOSIS — E860 Dehydration: Secondary | ICD-10-CM | POA: Diagnosis not present

## 2019-05-17 DIAGNOSIS — E119 Type 2 diabetes mellitus without complications: Secondary | ICD-10-CM | POA: Diagnosis not present

## 2019-05-17 DIAGNOSIS — Z881 Allergy status to other antibiotic agents status: Secondary | ICD-10-CM | POA: Diagnosis not present

## 2019-05-17 DIAGNOSIS — Z7984 Long term (current) use of oral hypoglycemic drugs: Secondary | ICD-10-CM | POA: Diagnosis not present

## 2019-05-17 DIAGNOSIS — M6281 Muscle weakness (generalized): Secondary | ICD-10-CM | POA: Diagnosis not present

## 2019-05-17 DIAGNOSIS — Z79899 Other long term (current) drug therapy: Secondary | ICD-10-CM | POA: Diagnosis not present

## 2019-05-17 DIAGNOSIS — R262 Difficulty in walking, not elsewhere classified: Secondary | ICD-10-CM | POA: Diagnosis not present

## 2019-05-17 DIAGNOSIS — R260 Ataxic gait: Secondary | ICD-10-CM | POA: Diagnosis not present

## 2019-05-17 DIAGNOSIS — Z882 Allergy status to sulfonamides status: Secondary | ICD-10-CM | POA: Diagnosis not present

## 2019-05-17 DIAGNOSIS — Z780 Asymptomatic menopausal state: Secondary | ICD-10-CM | POA: Diagnosis not present

## 2019-05-17 DIAGNOSIS — Z96651 Presence of right artificial knee joint: Secondary | ICD-10-CM | POA: Diagnosis not present

## 2019-05-17 DIAGNOSIS — M4800 Spinal stenosis, site unspecified: Secondary | ICD-10-CM | POA: Diagnosis not present

## 2019-05-17 DIAGNOSIS — Z981 Arthrodesis status: Secondary | ICD-10-CM | POA: Diagnosis not present

## 2019-05-17 DIAGNOSIS — I10 Essential (primary) hypertension: Secondary | ICD-10-CM | POA: Diagnosis not present

## 2019-05-17 DIAGNOSIS — Z9181 History of falling: Secondary | ICD-10-CM | POA: Diagnosis not present

## 2019-05-17 DIAGNOSIS — Z7902 Long term (current) use of antithrombotics/antiplatelets: Secondary | ICD-10-CM | POA: Diagnosis not present

## 2019-05-17 DIAGNOSIS — Z888 Allergy status to other drugs, medicaments and biological substances status: Secondary | ICD-10-CM | POA: Diagnosis not present

## 2019-05-17 DIAGNOSIS — Z886 Allergy status to analgesic agent status: Secondary | ICD-10-CM | POA: Diagnosis not present

## 2019-05-17 DIAGNOSIS — G459 Transient cerebral ischemic attack, unspecified: Secondary | ICD-10-CM | POA: Diagnosis not present

## 2019-05-17 DIAGNOSIS — M170 Bilateral primary osteoarthritis of knee: Secondary | ICD-10-CM | POA: Diagnosis not present

## 2019-05-17 DIAGNOSIS — Z87891 Personal history of nicotine dependence: Secondary | ICD-10-CM | POA: Diagnosis not present

## 2019-05-19 ENCOUNTER — Inpatient Hospital Stay (HOSPITAL_COMMUNITY)
Admission: EM | Admit: 2019-05-19 | Discharge: 2019-05-21 | DRG: 315 | Disposition: A | Discharge status: Home / Self Care | Payer: Medicare Other | Attending: Hospitalist | Admitting: Hospitalist

## 2019-05-19 ENCOUNTER — Other Ambulatory Visit: Payer: Self-pay

## 2019-05-19 ENCOUNTER — Emergency Department (HOSPITAL_COMMUNITY): Payer: Medicare Other

## 2019-05-19 ENCOUNTER — Encounter (HOSPITAL_COMMUNITY): Payer: Self-pay | Admitting: Emergency Medicine

## 2019-05-19 DIAGNOSIS — N179 Acute kidney failure, unspecified: Secondary | ICD-10-CM | POA: Diagnosis not present

## 2019-05-19 DIAGNOSIS — J449 Chronic obstructive pulmonary disease, unspecified: Secondary | ICD-10-CM | POA: Diagnosis present

## 2019-05-19 DIAGNOSIS — I878 Other specified disorders of veins: Secondary | ICD-10-CM | POA: Diagnosis present

## 2019-05-19 DIAGNOSIS — E119 Type 2 diabetes mellitus without complications: Secondary | ICD-10-CM | POA: Diagnosis present

## 2019-05-19 DIAGNOSIS — R39198 Other difficulties with micturition: Secondary | ICD-10-CM | POA: Diagnosis not present

## 2019-05-19 DIAGNOSIS — R410 Disorientation, unspecified: Secondary | ICD-10-CM | POA: Diagnosis not present

## 2019-05-19 DIAGNOSIS — I872 Venous insufficiency (chronic) (peripheral): Secondary | ICD-10-CM | POA: Diagnosis not present

## 2019-05-19 DIAGNOSIS — R55 Syncope and collapse: Secondary | ICD-10-CM | POA: Diagnosis not present

## 2019-05-19 DIAGNOSIS — Z20828 Contact with and (suspected) exposure to other viral communicable diseases: Secondary | ICD-10-CM | POA: Diagnosis present

## 2019-05-19 DIAGNOSIS — Z7902 Long term (current) use of antithrombotics/antiplatelets: Secondary | ICD-10-CM | POA: Diagnosis not present

## 2019-05-19 DIAGNOSIS — Z8249 Family history of ischemic heart disease and other diseases of the circulatory system: Secondary | ICD-10-CM | POA: Diagnosis not present

## 2019-05-19 DIAGNOSIS — Z96651 Presence of right artificial knee joint: Secondary | ICD-10-CM | POA: Diagnosis present

## 2019-05-19 DIAGNOSIS — R52 Pain, unspecified: Secondary | ICD-10-CM | POA: Diagnosis not present

## 2019-05-19 DIAGNOSIS — Z7982 Long term (current) use of aspirin: Secondary | ICD-10-CM

## 2019-05-19 DIAGNOSIS — Z9181 History of falling: Secondary | ICD-10-CM

## 2019-05-19 DIAGNOSIS — I959 Hypotension, unspecified: Principal | ICD-10-CM | POA: Diagnosis present

## 2019-05-19 DIAGNOSIS — Z800 Family history of malignant neoplasm of digestive organs: Secondary | ICD-10-CM | POA: Diagnosis not present

## 2019-05-19 DIAGNOSIS — I10 Essential (primary) hypertension: Secondary | ICD-10-CM | POA: Diagnosis present

## 2019-05-19 DIAGNOSIS — E785 Hyperlipidemia, unspecified: Secondary | ICD-10-CM | POA: Diagnosis present

## 2019-05-19 DIAGNOSIS — S0083XA Contusion of other part of head, initial encounter: Secondary | ICD-10-CM | POA: Diagnosis not present

## 2019-05-19 DIAGNOSIS — R5383 Other fatigue: Secondary | ICD-10-CM | POA: Diagnosis not present

## 2019-05-19 DIAGNOSIS — R4020 Unspecified coma: Secondary | ICD-10-CM | POA: Diagnosis not present

## 2019-05-19 DIAGNOSIS — Z743 Need for continuous supervision: Secondary | ICD-10-CM | POA: Diagnosis not present

## 2019-05-19 DIAGNOSIS — Z0389 Encounter for observation for other suspected diseases and conditions ruled out: Secondary | ICD-10-CM | POA: Diagnosis not present

## 2019-05-19 DIAGNOSIS — L03115 Cellulitis of right lower limb: Secondary | ICD-10-CM | POA: Diagnosis not present

## 2019-05-19 DIAGNOSIS — R404 Transient alteration of awareness: Secondary | ICD-10-CM | POA: Diagnosis not present

## 2019-05-19 LAB — CBC
HCT: 38.7 % (ref 36.0–46.0)
Hemoglobin: 11.9 g/dL — ABNORMAL LOW (ref 12.0–15.0)
MCH: 29.1 pg (ref 26.0–34.0)
MCHC: 30.7 g/dL (ref 30.0–36.0)
MCV: 94.6 fL (ref 80.0–100.0)
Platelets: 165 10*3/uL (ref 150–400)
RBC: 4.09 MIL/uL (ref 3.87–5.11)
RDW: 14.8 % (ref 11.5–15.5)
WBC: 6.2 10*3/uL (ref 4.0–10.5)
nRBC: 0 % (ref 0.0–0.2)

## 2019-05-19 LAB — BASIC METABOLIC PANEL
Anion gap: 16 — ABNORMAL HIGH (ref 5–15)
BUN: 17 mg/dL (ref 8–23)
CO2: 22 mmol/L (ref 22–32)
Calcium: 9.1 mg/dL (ref 8.9–10.3)
Chloride: 102 mmol/L (ref 98–111)
Creatinine, Ser: 1.21 mg/dL — ABNORMAL HIGH (ref 0.44–1.00)
GFR calc Af Amer: 50 mL/min — ABNORMAL LOW (ref >60)
GFR calc non Af Amer: 43 mL/min — ABNORMAL LOW (ref >60)
Glucose, Bld: 172 mg/dL — ABNORMAL HIGH (ref 70–99)
Potassium: 3.8 mmol/L (ref 3.5–5.1)
Sodium: 140 mmol/L (ref 135–145)

## 2019-05-19 LAB — TROPONIN I (HIGH SENSITIVITY)
Troponin I (High Sensitivity): 6 ng/L (ref <18)
Troponin I (High Sensitivity): 6 ng/L (ref <18)

## 2019-05-19 LAB — LACTIC ACID, PLASMA
Lactic Acid, Venous: 2.5 mmol/L (ref 0.5–1.9)
Lactic Acid, Venous: 2.2 mmol/L (ref 0.5–1.9)

## 2019-05-19 LAB — CBG MONITORING, ED: Glucose-Capillary: 152 mg/dL — ABNORMAL HIGH (ref 70–99)

## 2019-05-19 MED ORDER — SODIUM CHLORIDE 0.9% FLUSH
3.0000 mL | Freq: Once | INTRAVENOUS | Status: AC
  Administered 2019-05-19: 3 mL via INTRAVENOUS

## 2019-05-19 MED ORDER — SODIUM CHLORIDE 0.9 % IV BOLUS
1000.0000 mL | Freq: Once | INTRAVENOUS | Status: AC
  Administered 2019-05-19: 1000 mL via INTRAVENOUS

## 2019-05-19 NOTE — ED Triage Notes (Signed)
Pt was sitting in chair @ daughters house when she "passed out". EMS was called and when they arrived, pt was still unconscious but was arousable with a sternal rub. Pt eventually came around on her own and was transported here. Pt's blood glucose was 208 per EMS and pt's BP was reading 76/64 when checked by EMS. Pt also had pinpoint pupils per EMS. Pt recently discharged from [facility] after a fall and was recovering at daughters house. Pt is taking pain meds for this. Pt also on BP meds. Pt states this is a new occurrence for her.

## 2019-05-19 NOTE — ED Notes (Signed)
Date and time results received: 05/19/19 2119 (use smartphrase ".now" to insert current time)  Test: lactic acid Critical Value:2.5  Name of Provider Notified: [email or web address]  Orders Received? Or Actions Taken?:

## 2019-05-19 NOTE — ED Notes (Signed)
Date and time results received: 05/19/19 2318 (use smartphrase ".now" to insert current time)  Test:lactic acid Critical Value: 2.2  Name of Provider Notified: venter,pa  Orders Received? Or Actions Taken?:

## 2019-05-19 NOTE — ED Provider Notes (Signed)
Medical screening examination/treatment/procedure(s) were conducted as a shared visit with non-physician practitioner(s) and myself.  I personally evaluated the patient during the encounter.  EKG Interpretation  Date/Time:  Wednesday May 19 2019 20:17:21 EST Ventricular Rate:  67 PR Interval:    QRS Duration: 109 QT Interval:  413 QTC Calculation: 436 R Axis:   27 Text Interpretation: Sinus rhythm Prolonged PR interval Confirmed by Fredia Sorrow [phone]) on 05/19/2019 9:40:28 PM   Results for orders placed or performed during the hospital encounter of 0000000  Basic metabolic panel  Result Value Ref Range   Sodium 140 135 - 145 mmol/L   Potassium 3.8 3.5 - 5.1 mmol/L   Chloride 102 98 - 111 mmol/L   CO2 22 22 - 32 mmol/L   Glucose, Bld 172 (H) 70 - 99 mg/dL   BUN 17 8 - 23 mg/dL   Creatinine, Ser 1.21 (H) 0.44 - 1.00 mg/dL   Calcium 9.1 8.9 - 10.3 mg/dL   GFR calc non Af Amer 43 (L) >60 mL/min   GFR calc Af Amer 50 (L) >60 mL/min   Anion gap 16 (H) 5 - 15  CBC  Result Value Ref Range   WBC 6.2 4.0 - 10.5 K/uL   RBC 4.09 3.87 - 5.11 MIL/uL   Hemoglobin 11.9 (L) 12.0 - 15.0 g/dL   HCT 38.7 36.0 - 46.0 %   MCV 94.6 80.0 - 100.0 fL   MCH 29.1 26.0 - 34.0 pg   MCHC 30.7 30.0 - 36.0 g/dL   RDW 14.8 11.5 - 15.5 %   Platelets 165 150 - 400 K/uL   nRBC 0.0 0.0 - 0.2 %  Lactic acid, plasma  Result Value Ref Range   Lactic Acid, Venous 2.5 (HH) 0.5 - 1.9 mmol/L  CBG monitoring, ED  Result Value Ref Range   Glucose-Capillary 152 (H) 70 - 99 mg/dL  Troponin I (High Sensitivity)  Result Value Ref Range   Troponin I (High Sensitivity) 6 <18 ng/L   Dg Chest 2 View  Result Date: 05/19/2019 CLINICAL DATA:  Rule out detection EXAM: CHEST - 2 VIEW COMPARISON:  10/12/2018 FINDINGS: The heart size and mediastinal contours are within normal limits. Both lungs are clear. The visualized skeletal structures are unremarkable. IMPRESSION: No active cardiopulmonary disease.  Electronically Signed   By: Franchot Gallo M.D.   On: 05/19/2019 21:24   Ct Head Wo Contrast  Result Date: 05/19/2019 CLINICAL DATA:  Altered level of consciousness. EXAM: CT HEAD WITHOUT CONTRAST TECHNIQUE: Contiguous axial images were obtained from the base of the skull through the vertex without intravenous contrast. COMPARISON:  CT head 05/14/2019 FINDINGS: Brain: Moderate atrophy. Moderate white matter disease bilaterally is stable. Negative for acute infarct, hemorrhage, mass. Vascular: Negative for hyperdense vessel Skull: Negative Sinuses/Orbits: Negative Other: Mild soft tissue swelling over the right maxilla. Gas in the right eyelid. Question laceration or contusion. IMPRESSION: Atrophy and chronic microvascular ischemia. Question right facial contusion. Electronically Signed   By: Franchot Gallo M.D.   On: 05/19/2019 21:28    Patient seen by me along with physician assistant.  Patient was sitting in chair daughter's house and passed out.  EMS was called and when they arrived patient was still unconscious but arousable with sternal rub.  Patient eventually came around on her own and was transported here.  Blood glucose was 208 per EMS and patient's blood pressure reading was 76/64 when checked by EMS.  Patient also reportedly had pinpoint pupils per EMS.  Patient recently  discharged from [facility] after a fall and was recovering at daughters house.  Patient is taking pain meds for this.  Patient also on blood pressure meds.  Patient here is alert.  But certainly had some kind of syncopal event which will require monitoring.  Work-up here head CT negative patient has a little bit elevated lactic acid.  Blood pressures here are good 124/69 not febrile.  However lactic acid is above 2 but less than 4 blood cultures will be done.  Patient will require admission for monitoring.   Fredia Sorrow, MD 05/19/19 [phone]

## 2019-05-19 NOTE — ED Provider Notes (Signed)
[facility] EMERGENCY DEPARTMENT Provider Note   CSN: [id number] Arrival date & time: 05/19/19  1946     History   Chief Complaint Chief Complaint  Patient presents with   Loss of Consciousness    HPI [name] is a 78 y.o. female with PMHx COPD, diabetes, HTN who presents to the ED today via EMS for syncopal episode that occurred just prior to arrival.  Per report patient was sitting in chair at daughter's house when she had syncopal episode that lasted approximately 10 minutes.  When EMS arrived on scene patient was still unconscious but arousable with sternal rub.  CBG on arrival 208 and blood pressure soft 76/64.  EMS had also reported pinpoint pupils.  It does appear patient was recently discharged from [facility] after a fall and was recovering at daughter's house.  Patient is currently taking pain medication for this.  Patient cannot recall events at all.  She states she does not think she passed out.  Has no physical complaints at this time.  She states that she was eating dinner when she just felt generally weak all over and that is last thing she can remember.   Per daughter [name] - patient recently had a fall last week and was seen in the ED at [facility] with findings of fractures to her face although daughter cannot specifically say what bones were broken and unable to see this in chart review.  She reports that she was discharged home from the emergency department but continued to ask repetitive questions throughout the week.  Daughter called patient's PCP who admitted her on Friday 12/4 with discharge 12/7.  Daughter reports that since being home patient has continued to act more confused than her normal self.  She does report that there is concern for TIAs during her work-up in the hospital.  Is currently on Plavix and a baby aspirin daily.  Daughter reports that they were eating dinner when the patient looked flush and then passed out.  She began leaning to the right  in her chair they were able to catch her before she hit the ground.  No head injury.      The history is provided by the patient, a relative and the EMS personnel.    Past Medical History:  Diagnosis Date   Aortic stenosis    Carpal tunnel syndrome, right    COPD (chronic obstructive pulmonary disease) (HCC)    DJD (degenerative joint disease)    Eczematous dermatitis    Essential hypertension    Gallstones    Hyperlipidemia    Osteoarthritis    Rotator cuff syndrome    Spinal stenosis of lumbar region    Squamous cell carcinoma    Type 2 diabetes mellitus ([place])     Patient Active Problem List   Diagnosis Date Noted   Syncope 05/20/2019   TIA (transient ischemic attack) 11/04/2012   Postop Transfusion history 10/03/2011   Septic arthritis of knee, right ([place]) 10/02/2011   Acute blood loss anemia 04/22/2011   CARCINOMA, SQUAMOUS CELL 09/26/2008   DYSLIPIDEMIA 09/26/2008   HYPERTENSION 09/26/2008   DEGENERATIVE JOINT DISEASE, GENERALIZED 09/26/2008   OSTEOARTHRITIS 09/26/2008   ROTATOR CUFF SYNDROME 09/26/2008   DIABETES MELLITUS, BORDERLINE 09/26/2008   DYSPNEA 09/23/2008    Past Surgical History:  Procedure Laterality Date   BACK SURGERY     CATARACT EXTRACTION  2011   bilateral   JOINT REPLACEMENT  09-27-11   '04/ right with resection for infection,  now antibiotic spacer planned   JOINT REPLACEMENT     not replaced- only surgery 2007/2011/2012.   LUMBAR LAMINECTOMY/DECOMPRESSION MICRODISCECTOMY  04/19/2011   Procedure: LUMBAR LAMINECTOMY/DECOMPRESSION MICRODISCECTOMY;  Surgeon: Tobi Bastos;  Location: [facility];  Service: Orthopedics;  Laterality: N/A;  Central decompression Lumbar two to lumbar three and Three to Lumbar Four    (xray)   TOTAL KNEE REVISION  12/04/2011   Procedure: TOTAL KNEE REVISION;  Surgeon: Gearlean Alf, MD;  Location: [facility];  Service: Orthopedics;  Laterality: Right;  Reimplantation of a Right Total Knee  Arthroplasty   TUBAL LIGATION       OB History   No obstetric history on file.      Home Medications    Prior to Admission medications   Medication Sig Start Date End Date Taking? Authorizing Provider  aspirin EC 81 MG tablet Take 81 mg by mouth daily.    [provider]  clopidogrel (PLAVIX) 75 MG tablet Take 75 mg by mouth daily.  10/19/12   [provider]  diclofenac sodium (VOLTAREN) 1 % GEL Apply 2 g topically 4 (four) times daily as needed. 11/03/18   Long, Wonda Olds, MD  Ferrous Sulfate (IRON) 325 (65 FE) MG TABS Take 65 mg by mouth. Occasionally    [provider]  fish oil-omega-3 fatty acids 1000 MG capsule Take 2 g by mouth daily.    [provider]  labetalol (NORMODYNE) 300 MG tablet Take 300 mg by mouth 2 (two) times daily.    [provider]  metFORMIN (GLUCOPHAGE) 500 MG tablet Take 1 tablet by mouth 2 (two) times daily. 11/28/16   [provider]  Multiple Vitamin (MULTIVITAMIN) tablet Take 1 tablet by mouth daily.    [provider]  Multiple Vitamins-Minerals (VITAMIN D3 COMPLETE PO) Take 1 tablet by mouth daily.    [provider]  oxyCODONE-acetaminophen (PERCOCET/ROXICET) 5-325 MG per tablet Take 2 tablets by mouth every 4 (four) hours as needed for severe pain. 08/10/14   Linton Flemings, MD  quinapril-hydrochlorothiazide (ACCURETIC) 20-12.5 MG per tablet Take 1 tablet by mouth every morning.     [provider]  vitamin C (ASCORBIC ACID) 500 MG tablet Take 500 mg by mouth daily.    [provider]  VITAMIN E PO Take 1 tablet by mouth daily.    [provider]    Family History Family History  Problem Relation Age of Onset   Heart attack Mother    Cancer Mother        cholangiocarcinoma   Heart disease Father    Heart attack Brother    Heart attack Brother    Heart attack Brother    Coronary artery disease Other    Diabetes Other     Social  History Social History   Tobacco Use   Smoking status: Never Smoker   Smokeless tobacco: Never Used  Substance Use Topics   Alcohol use: No   Drug use: No     Allergies   Ciprofloxacin   Review of Systems Review of Systems  Constitutional: Positive for fatigue.  Neurological: Positive for syncope.  Psychiatric/Behavioral: Positive for confusion.  All other systems reviewed and are negative.    Physical Exam Updated Vital Signs BP (!) 94/56    Pulse 66    Temp 98.4 F (36.9 C) (Oral)    Resp 20    Ht 5' 5.5" (1.664 m)    Wt 87.5 kg    SpO2  97%    BMI 31.63 kg/m   Physical Exam Vitals signs and nursing note reviewed.  Constitutional:      Appearance: She is not ill-appearing or diaphoretic.  HENT:     Head: Normocephalic.     Comments: Ecchymosis noted to right cheek.     Right Ear: Tympanic membrane normal.     Left Ear: Tympanic membrane normal.  Eyes:     Extraocular Movements: Extraocular movements intact.     Pupils: Pupils are equal, round, and reactive to light.     Comments: Conjunctival hemorrhage noted to right eye.  Neck:     Musculoskeletal: Neck supple.  Cardiovascular:     Rate and Rhythm: Normal rate and regular rhythm.     Pulses: Normal pulses.  Pulmonary:     Effort: Pulmonary effort is normal.     Breath sounds: Normal breath sounds. No wheezing, rhonchi or rales.  Abdominal:     Palpations: Abdomen is soft.     Tenderness: There is no abdominal tenderness. There is no guarding or rebound.  Musculoskeletal:     Right lower leg: No edema.     Left lower leg: No edema.  Skin:    General: Skin is warm and dry.  Neurological:     Mental Status: She is alert.     Comments: CN 3-12 grossly intact A&O x4 GCS 15 Sensation and strength intact Coordination with finger-to-nose WNL Neg romberg, neg pronator drift      ED Treatments / Results  Labs (all labs ordered are listed, but only abnormal results are displayed) Labs Reviewed   BASIC METABOLIC PANEL - Abnormal; Notable for the following components:      Result Value   Glucose, Bld 172 (*)    Creatinine, Ser 1.21 (*)    GFR calc non Af Amer 43 (*)    GFR calc Af Amer 50 (*)    Anion gap 16 (*)    All other components within normal limits  CBC - Abnormal; Notable for the following components:   Hemoglobin 11.9 (*)    All other components within normal limits  URINALYSIS, ROUTINE W REFLEX MICROSCOPIC - Abnormal; Notable for the following components:   APPearance CLOUDY (*)    Leukocytes,Ua SMALL (*)    Bacteria, UA RARE (*)    All other components within normal limits  LACTIC ACID, PLASMA - Abnormal; Notable for the following components:   Lactic Acid, Venous 2.5 (*)    All other components within normal limits  LACTIC ACID, PLASMA - Abnormal; Notable for the following components:   Lactic Acid, Venous 2.2 (*)    All other components within normal limits  CBG MONITORING, ED - Abnormal; Notable for the following components:   Glucose-Capillary 152 (*)    All other components within normal limits  CULTURE, BLOOD (ROUTINE X 2)  CULTURE, BLOOD (ROUTINE X 2)  SARS CORONAVIRUS 2 (TAT 6-24 HRS)  TROPONIN I (HIGH SENSITIVITY)  TROPONIN I (HIGH SENSITIVITY)    EKG EKG Interpretation  Date/Time:  Wednesday May 19 2019 20:17:21 EST Ventricular Rate:  67 PR Interval:    QRS Duration: 109 QT Interval:  413 QTC Calculation: 436 R Axis:   27 Text Interpretation: Sinus rhythm Prolonged PR interval Confirmed by Fredia Sorrow [phone]) on 05/19/2019 9:40:28 PM   Radiology Dg Chest 2 View  Result Date: 05/19/2019 CLINICAL DATA:  Rule out detection EXAM: CHEST - 2 VIEW COMPARISON:  10/12/2018 FINDINGS: The heart size and mediastinal contours  are within normal limits. Both lungs are clear. The visualized skeletal structures are unremarkable. IMPRESSION: No active cardiopulmonary disease. Electronically Signed   By: Franchot Gallo M.D.   On: 05/19/2019 21:24    Ct Head Wo Contrast  Result Date: 05/19/2019 CLINICAL DATA:  Altered level of consciousness. EXAM: CT HEAD WITHOUT CONTRAST TECHNIQUE: Contiguous axial images were obtained from the base of the skull through the vertex without intravenous contrast. COMPARISON:  CT head 05/14/2019 FINDINGS: Brain: Moderate atrophy. Moderate white matter disease bilaterally is stable. Negative for acute infarct, hemorrhage, mass. Vascular: Negative for hyperdense vessel Skull: Negative Sinuses/Orbits: Negative Other: Mild soft tissue swelling over the right maxilla. Gas in the right eyelid. Question laceration or contusion. IMPRESSION: Atrophy and chronic microvascular ischemia. Question right facial contusion. Electronically Signed   By: Franchot Gallo M.D.   On: 05/19/2019 21:28    Procedures Procedures (including critical care time)  Medications Ordered in ED Medications  sodium chloride flush (NS) 0.9 % injection 3 mL (3 mLs Intravenous Given 05/19/19 2019)  sodium chloride 0.9 % bolus 1,000 mL (0 mLs Intravenous Stopped 05/20/19 0033)     Initial Impression / Assessment and Plan / ED Course  I have reviewed the triage vital signs and the nursing notes.  Pertinent labs & imaging results that were available during my care of the patient were reviewed by me and considered in my medical decision making (see chart for details).  78 year old female presents to the ED today with a syncopal episode unprovoked while eating dinner tonight.  Per triage report patient now for approximately 10 minutes which daughter confirms.  On arrival she has no recollection of the incident.  She was hypotensive at 94/56, will give fluids.  Patient afebrile without tachycardia.  Has no physical complaints currently.  It does appear patient had a fall last week and was seen at [facility] ED with fractures of some facial bones although unable to see this in our system and daughter unable to give further information.  Reports that she  came home after ED visit was not acting right so daughter called PCP and patient was admitted directly.  Will try to obtain records from [facility] for further evaluation.  She has no focal neuro deficits on exam.  Will obtain CT head and work-up for infection.  Regardless patient will need to be admitted for syncope work-up.  CBC without leukocytosis.  Hemoglobin stable and much improved from recent.  Creatinine mildly elevated at 1.21.  CBG 152.  Lactic acid 2.5.  Patient already receiving fluids.  Given this will obtain blood cultures.  Chest x-ray negative.  Waiting urinalysis.  Troponins negative.   Pete lactic acid 2.2.  Patient receiving fluids.  To evaluate patient and she was in the restroom.  It does appear that she has been in the restroom for quite some time trying to urinate.  Daughter does report she has been having some difficulty urinating today.  Will obtain bladder scan.   Unable to urinate after being in the bathroom for quite some time.  Bladder scan afterwards 84.  Awaiting urinalysis at this time.  Given plan for epic downtime soon will consult for admission prior to urinalysis.  Discussed case with Dr. Darrick Meigs with hospitalist who agrees to accept patient for admission.  Clinical Course as of May 19 46  Thu May 20, 2019  0022 Bladder scan 184   [MV]    Clinical Course User Index [MV] Eustaquio Maize, [place]  Final Clinical Impressions(s) / ED Diagnoses   Final diagnoses:  Syncope and collapse    ED Discharge Orders    None       Eustaquio Maize, PA-C 05/20/19 0047    Fredia Sorrow, MD 05/20/19 [phone]

## 2019-05-20 DIAGNOSIS — I959 Hypotension, unspecified: Secondary | ICD-10-CM | POA: Diagnosis present

## 2019-05-20 DIAGNOSIS — I878 Other specified disorders of veins: Secondary | ICD-10-CM | POA: Diagnosis present

## 2019-05-20 DIAGNOSIS — R55 Syncope and collapse: Secondary | ICD-10-CM | POA: Diagnosis not present

## 2019-05-20 DIAGNOSIS — I872 Venous insufficiency (chronic) (peripheral): Secondary | ICD-10-CM | POA: Diagnosis present

## 2019-05-20 DIAGNOSIS — Z9181 History of falling: Secondary | ICD-10-CM | POA: Diagnosis not present

## 2019-05-20 DIAGNOSIS — N179 Acute kidney failure, unspecified: Secondary | ICD-10-CM | POA: Diagnosis present

## 2019-05-20 DIAGNOSIS — E119 Type 2 diabetes mellitus without complications: Secondary | ICD-10-CM | POA: Diagnosis present

## 2019-05-20 DIAGNOSIS — Z7902 Long term (current) use of antithrombotics/antiplatelets: Secondary | ICD-10-CM | POA: Diagnosis not present

## 2019-05-20 DIAGNOSIS — E785 Hyperlipidemia, unspecified: Secondary | ICD-10-CM | POA: Diagnosis present

## 2019-05-20 DIAGNOSIS — Z20828 Contact with and (suspected) exposure to other viral communicable diseases: Secondary | ICD-10-CM | POA: Diagnosis present

## 2019-05-20 DIAGNOSIS — I10 Essential (primary) hypertension: Secondary | ICD-10-CM | POA: Diagnosis present

## 2019-05-20 DIAGNOSIS — Z7982 Long term (current) use of aspirin: Secondary | ICD-10-CM | POA: Diagnosis not present

## 2019-05-20 DIAGNOSIS — L03115 Cellulitis of right lower limb: Secondary | ICD-10-CM | POA: Diagnosis present

## 2019-05-20 DIAGNOSIS — J449 Chronic obstructive pulmonary disease, unspecified: Secondary | ICD-10-CM | POA: Diagnosis present

## 2019-05-20 DIAGNOSIS — Z96651 Presence of right artificial knee joint: Secondary | ICD-10-CM | POA: Diagnosis present

## 2019-05-20 DIAGNOSIS — Z8249 Family history of ischemic heart disease and other diseases of the circulatory system: Secondary | ICD-10-CM | POA: Diagnosis not present

## 2019-05-20 DIAGNOSIS — Z800 Family history of malignant neoplasm of digestive organs: Secondary | ICD-10-CM | POA: Diagnosis not present

## 2019-05-20 LAB — URINALYSIS, ROUTINE W REFLEX MICROSCOPIC
Bilirubin Urine: NEGATIVE
Glucose, UA: NEGATIVE mg/dL
Hgb urine dipstick: NEGATIVE
Ketones, ur: NEGATIVE mg/dL
Nitrite: NEGATIVE
Protein, ur: NEGATIVE mg/dL
Specific Gravity, Urine: 1.019 (ref 1.005–1.030)
pH: 5 (ref 5.0–8.0)

## 2019-05-20 LAB — COMPREHENSIVE METABOLIC PANEL
ALT: 16 U/L (ref 0–44)
AST: 18 U/L (ref 15–41)
Albumin: 3.5 g/dL (ref 3.5–5.0)
Alkaline Phosphatase: 64 U/L (ref 38–126)
Anion gap: 11 (ref 5–15)
BUN: 18 mg/dL (ref 8–23)
CO2: 24 mmol/L (ref 22–32)
Calcium: 8.6 mg/dL — ABNORMAL LOW (ref 8.9–10.3)
Chloride: 106 mmol/L (ref 98–111)
Creatinine, Ser: 0.98 mg/dL (ref 0.44–1.00)
GFR calc Af Amer: >60 mL/min (ref >60)
GFR calc non Af Amer: 56 mL/min — ABNORMAL LOW (ref >60)
Glucose, Bld: 156 mg/dL — ABNORMAL HIGH (ref 70–99)
Potassium: 3.4 mmol/L — ABNORMAL LOW (ref 3.5–5.1)
Sodium: 141 mmol/L (ref 135–145)
Total Bilirubin: 0.5 mg/dL (ref 0.3–1.2)
Total Protein: 6.1 g/dL — ABNORMAL LOW (ref 6.5–8.1)

## 2019-05-20 LAB — CBC
HCT: 33.6 % — ABNORMAL LOW (ref 36.0–46.0)
Hemoglobin: 10.8 g/dL — ABNORMAL LOW (ref 12.0–15.0)
MCH: 29.8 pg (ref 26.0–34.0)
MCHC: 32.1 g/dL (ref 30.0–36.0)
MCV: 92.6 fL (ref 80.0–100.0)
Platelets: 173 10*3/uL (ref 150–400)
RBC: 3.63 MIL/uL — ABNORMAL LOW (ref 3.87–5.11)
RDW: 14.8 % (ref 11.5–15.5)
WBC: 5 10*3/uL (ref 4.0–10.5)
nRBC: 0 % (ref 0.0–0.2)

## 2019-05-20 LAB — GLUCOSE, CAPILLARY
Glucose-Capillary: 91 mg/dL (ref 70–99)
Glucose-Capillary: 107 mg/dL — ABNORMAL HIGH (ref 70–99)
Glucose-Capillary: 99 mg/dL (ref 70–99)
Glucose-Capillary: 112 mg/dL — ABNORMAL HIGH (ref 70–99)

## 2019-05-20 LAB — SARS CORONAVIRUS 2 (TAT 6-24 HRS): SARS Coronavirus 2: NEGATIVE

## 2019-05-20 LAB — HEMOGLOBIN A1C
Hgb A1c MFr Bld: 6.4 % — ABNORMAL HIGH (ref 4.8–5.6)
Mean Plasma Glucose: 136.98 mg/dL

## 2019-05-20 LAB — TROPONIN I (HIGH SENSITIVITY)
Troponin I (High Sensitivity): 7 ng/L (ref <18)
Troponin I (High Sensitivity): 7 ng/L (ref <18)

## 2019-05-20 MED ORDER — ONDANSETRON HCL 4 MG/2ML IJ SOLN: 4.0000 mg | Freq: Four times a day (QID) | INTRAMUSCULAR | Status: DC | PRN

## 2019-05-20 MED ORDER — ADULT MULTIVITAMIN W/MINERALS CH
1.0000 | ORAL_TABLET | Freq: Every day | ORAL | Status: DC
  Administered 2019-05-20 – 2019-05-21 (×2): 1 via ORAL
  Filled 2019-05-20 (×2): qty 1

## 2019-05-20 MED ORDER — ENOXAPARIN SODIUM 40 MG/0.4ML ~~LOC~~ SOLN
40.0000 mg | SUBCUTANEOUS | Status: DC
  Administered 2019-05-20: 40 mg via SUBCUTANEOUS
  Filled 2019-05-20 (×2): qty 0.4

## 2019-05-20 MED ORDER — LABETALOL HCL 200 MG PO TABS
300.0000 mg | ORAL_TABLET | Freq: Two times a day (BID) | ORAL | Status: DC
  Administered 2019-05-20 – 2019-05-21 (×3): 300 mg via ORAL
  Filled 2019-05-20 (×3): qty 2

## 2019-05-20 MED ORDER — ONDANSETRON HCL 4 MG PO TABS: 4.0000 mg | ORAL_TABLET | Freq: Four times a day (QID) | ORAL | Status: DC | PRN

## 2019-05-20 MED ORDER — CLINDAMYCIN PHOSPHATE 600 MG/50ML IV SOLN
600.0000 mg | Freq: Three times a day (TID) | INTRAVENOUS | Status: DC
  Administered 2019-05-20 – 2019-05-21 (×4): 600 mg via INTRAVENOUS
  Filled 2019-05-20 (×4): qty 50

## 2019-05-20 MED ORDER — ACETAMINOPHEN 325 MG PO TABS: 650.0000 mg | ORAL_TABLET | Freq: Four times a day (QID) | ORAL | Status: DC | PRN

## 2019-05-20 MED ORDER — INSULIN ASPART 100 UNIT/ML ~~LOC~~ SOLN
0.0000 [IU] | Freq: Three times a day (TID) | SUBCUTANEOUS | Status: DC
  Administered 2019-05-21: 1 [IU] via SUBCUTANEOUS

## 2019-05-20 MED ORDER — ACETAMINOPHEN 650 MG RE SUPP: 650.0000 mg | Freq: Four times a day (QID) | RECTAL | Status: DC | PRN

## 2019-05-20 MED ORDER — CLOPIDOGREL BISULFATE 75 MG PO TABS
75.0000 mg | ORAL_TABLET | Freq: Every day | ORAL | Status: DC
  Administered 2019-05-20 – 2019-05-21 (×2): 75 mg via ORAL
  Filled 2019-05-20 (×2): qty 1

## 2019-05-20 MED ORDER — OXYCODONE-ACETAMINOPHEN 5-325 MG PO TABS: 2.0000 | ORAL_TABLET | ORAL | Status: DC | PRN

## 2019-05-20 MED ORDER — ASPIRIN EC 81 MG PO TBEC
81.0000 mg | DELAYED_RELEASE_TABLET | Freq: Every day | ORAL | Status: DC
  Administered 2019-05-20 – 2019-05-21 (×2): 81 mg via ORAL
  Filled 2019-05-20 (×2): qty 1

## 2019-05-20 MED ORDER — SODIUM CHLORIDE 0.9 % IV SOLN: INTRAVENOUS | Status: DC

## 2019-05-20 MED ORDER — VITAMIN C 500 MG PO TABS
500.0000 mg | ORAL_TABLET | Freq: Every day | ORAL | Status: DC
  Administered 2019-05-20 – 2019-05-21 (×2): 500 mg via ORAL
  Filled 2019-05-20 (×2): qty 1

## 2019-05-20 NOTE — Evaluation (Signed)
Physical Therapy Evaluation Patient Details Name: [name] MRN: [id number] DOB: [birth year] Today's Date: 05/20/2019   History of Present Illness  [name]  is a 78 y.o. female, with past medical history of COPD, diabetes mellitus, hypertension who came to ED with chief complaints of syncope.  Patient was sitting in chair daughter's house when suddenly she passed out for approximately 10 minutes.  When EMS arrived patient was still unconscious but arousable with sternal rub.  CBG on arrival was 208, blood pressure was 76/64.  Patient was recently discharged from our hospital after evaluation for syncope.  There was no seizure-like activity noted.  No tongue biting or bladder incontinence noted.    Clinical Impression  Patient functioning at baseline for functional mobility and gait.  Patient demonstrates good return for transferring to commode in bathroom, washing hands standing in front of sink, ambulation on level, inclined, declined surfaces and on steps using 1 side rail and cane.  Plan:  Patient discharged from physical therapy to care of nursing for ambulation daily as tolerated for length of stay.     Follow Up Recommendations No PT follow up    Equipment Recommendations  None recommended by PT    Recommendations for Other Services       Precautions / Restrictions Precautions Precautions: None Restrictions Weight Bearing Restrictions: No      Mobility  Bed Mobility Overal bed mobility: Modified Independent             General bed mobility comments: increased time  Transfers Overall transfer level: Modified independent Equipment used: Straight cane             General transfer comment: slightly increased time  Ambulation/Gait Ambulation/Gait assistance: Modified independent (Device/Increase time) Gait Distance (Feet): 200 Feet Assistive device: Straight cane Gait Pattern/deviations: Step-through pattern;WFL(Within Functional Limits) Gait velocity:  decreased   General Gait Details: demonstrates good return for ambulation on level, inclined and declined surfaces without loss of balance  Stairs Stairs: Yes Stairs assistance: Modified independent (Device/Increase time) Stair Management: One rail Right;One rail Left;Step to pattern;With cane Number of Stairs: 3 General stair comments: after verbal cues for proper sequencing using SPC, patient demonstrates good return for going up/down steps using 1 siderail without loss of balance  Wheelchair Mobility    Modified Rankin (Stroke Patients Only)       Balance Overall balance assessment: Needs assistance Sitting-balance support: Feet supported;No upper extremity supported Sitting balance-Leahy Scale: Good Sitting balance - Comments: seated at EOB   Standing balance support: During functional activity;No upper extremity supported Standing balance-Leahy Scale: Fair Standing balance comment: fair/good using SPC                             Pertinent Vitals/Pain Pain Assessment: Faces Faces Pain Scale: Hurts a little bit Pain Location: bilateral feet mostly toes Pain Descriptors / Indicators: Numbness;Pins and needles Pain Intervention(s): Limited activity within patient's tolerance;Monitored during session    [place] expects to be discharged to:: Private residence Living Arrangements: Alone Available Help at Discharge: Family;Available PRN/intermittently Type of Home: House Home Access: Stairs to enter Entrance Stairs-Rails: Right Entrance Stairs-Number of Steps: 3 Home Layout: Two level;Able to live on main level with bedroom/bathroom Home Equipment: [name] - single point;Walker - 4 wheels;Shower seat      Prior Function Level of Independence: Independent with assistive device(s)         Comments: Household ambulator with SPC or  Rollator     Hand Dominance        Extremity/Trunk Assessment   Upper Extremity Assessment Upper  Extremity Assessment: Overall WFL for tasks assessed    Lower Extremity Assessment Lower Extremity Assessment: Overall WFL for tasks assessed    Cervical / Trunk Assessment Cervical / Trunk Assessment: Kyphotic  Communication   Communication: No difficulties  Cognition Arousal/Alertness: Awake/alert Behavior During Therapy: WFL for tasks assessed/performed Overall Cognitive Status: Within Functional Limits for tasks assessed                                        General Comments      Exercises     Assessment/Plan    PT Assessment Patent does not need any further PT services  PT Problem List         PT Treatment Interventions      PT Goals (Current goals can be found in the Care Plan section)  Acute Rehab PT Goals Patient Stated Goal: return home with family to assist as needed PT Goal Formulation: With patient Time For Goal Achievement: 05/20/19 Potential to Achieve Goals: Good    Frequency     Barriers to discharge        Co-evaluation               AM-PAC PT "6 Clicks" Mobility  Outcome Measure Help needed turning from your back to your side while in a flat bed without using bedrails?: None Help needed moving from lying on your back to sitting on the side of a flat bed without using bedrails?: None Help needed moving to and from a bed to a chair (including a wheelchair)?: None Help needed standing up from a chair using your arms (e.g., wheelchair or bedside chair)?: None Help needed to walk in hospital room?: None Help needed climbing 3-5 steps with a railing? : None 6 Click Score: 24    End of Session   Activity Tolerance: Patient tolerated treatment well;Patient limited by fatigue Patient left: in chair;with call bell/phone within reach Nurse Communication: Mobility status PT Visit Diagnosis: Unsteadiness on feet (R26.81);Other abnormalities of gait and mobility (R26.89);Muscle weakness (generalized) (M62.81)    Time:  [id number] PT Time Calculation (min) (ACUTE ONLY): 32 min   Charges:     PT Treatments $Gait Training: 23-37 mins        4:11 PM, 05/20/19 Lonell Grandchild, MPT Physical Therapist with [facility] [phone] office [phone] mobile phone

## 2019-05-20 NOTE — H&P (Signed)
TRH H&P    Patient Demographics:    [name], is a 78 y.o. female  MRN: [id number]  DOB - 03/06/1941  Admit Date - 05/19/2019  Referring MD/NP/PA: Aundria Mems  Outpatient Primary MD for the patient is Neale Burly, MD  Patient coming from: Home  Chief complaint-passed out   HPI:    [name]  is a 78 y.o. female, with past medical history of COPD, diabetes mellitus, hypertension who came to ED with chief complaints of syncope.  Patient was sitting in chair daughter's house when suddenly she passed out for approximately 10 minutes.  When EMS arrived patient was still unconscious but arousable with sternal rub.  CBG on arrival was 208, blood pressure was 76/64.  Patient was recently discharged from our hospital after evaluation for syncope.  There was no seizure-like activity noted.  No tongue biting or bladder incontinence noted. Patient denies chest pain or shortness of breath. Denies nausea vomiting or diarrhea. Denies palpitations. Denies fever or chills. Denies coughing up any phlegm.   Review of systems:    In addition to the HPI above,    All other systems reviewed and are negative.    Past History of the following :    Past Medical History:  Diagnosis Date  . Aortic stenosis   . Carpal tunnel syndrome, right   . COPD (chronic obstructive pulmonary disease) ([place])   . DJD (degenerative joint disease)   . Eczematous dermatitis   . Essential hypertension   . Gallstones   . Hyperlipidemia   . Osteoarthritis   . Rotator cuff syndrome   . Spinal stenosis of lumbar region   . Squamous cell carcinoma   . Type 2 diabetes mellitus ([place])       Past Surgical History:  Procedure Laterality Date  . BACK SURGERY    . CATARACT EXTRACTION  2011   bilateral  . JOINT REPLACEMENT  09-27-11   '04/ right with resection for infection, now antibiotic spacer planned  . JOINT REPLACEMENT      not replaced- only surgery 2007/2011/2012.  . LUMBAR LAMINECTOMY/DECOMPRESSION MICRODISCECTOMY  04/19/2011   Procedure: LUMBAR LAMINECTOMY/DECOMPRESSION MICRODISCECTOMY;  Surgeon: Tobi Bastos;  Location: [facility];  Service: Orthopedics;  Laterality: N/A;  Central decompression Lumbar two to lumbar three and Three to Lumbar Four    (xray)  . TOTAL KNEE REVISION  12/04/2011   Procedure: TOTAL KNEE REVISION;  Surgeon: Gearlean Alf, MD;  Location: [facility];  Service: Orthopedics;  Laterality: Right;  Reimplantation of a Right Total Knee Arthroplasty  . TUBAL LIGATION        Social History:      Social History   Tobacco Use  . Smoking status: Never Smoker  . Smokeless tobacco: Never Used  Substance Use Topics  . Alcohol use: No       Family History :     Family History  Problem Relation Age of Onset  . Heart attack Mother   . Cancer Mother        cholangiocarcinoma  .  Heart disease Father   . Heart attack Brother   . Heart attack Brother   . Heart attack Brother   . Coronary artery disease Other   . Diabetes Other       Home Medications:   Prior to Admission medications   Medication Sig Start Date End Date Taking? Authorizing Provider  aspirin EC 81 MG tablet Take 81 mg by mouth daily.    [provider]  clopidogrel (PLAVIX) 75 MG tablet Take 75 mg by mouth daily.  10/19/12   [provider]  diclofenac sodium (VOLTAREN) 1 % GEL Apply 2 g topically 4 (four) times daily as needed. 11/03/18   Long, Wonda Olds, MD  Ferrous Sulfate (IRON) 325 (65 FE) MG TABS Take 65 mg by mouth. Occasionally    [provider]  fish oil-omega-3 fatty acids 1000 MG capsule Take 2 g by mouth daily.    [provider]  labetalol (NORMODYNE) 300 MG tablet Take 300 mg by mouth 2 (two) times daily.    [provider]  metFORMIN (GLUCOPHAGE) 500 MG tablet Take 1 tablet by mouth 2 (two) times daily. 11/28/16   [provider]  Multiple Vitamin  (MULTIVITAMIN) tablet Take 1 tablet by mouth daily.    [provider]  Multiple Vitamins-Minerals (VITAMIN D3 COMPLETE PO) Take 1 tablet by mouth daily.    [provider]  oxyCODONE-acetaminophen (PERCOCET/ROXICET) 5-325 MG per tablet Take 2 tablets by mouth every 4 (four) hours as needed for severe pain. 08/10/14   Linton Flemings, MD  quinapril-hydrochlorothiazide (ACCURETIC) 20-12.5 MG per tablet Take 1 tablet by mouth every morning.     [provider]  vitamin C (ASCORBIC ACID) 500 MG tablet Take 500 mg by mouth daily.    [provider]  VITAMIN E PO Take 1 tablet by mouth daily.    [provider]     Allergies:     Allergies  Allergen Reactions  . Ciprofloxacin Rash     Physical Exam:   Vitals  Blood pressure 112/82, pulse 66, temperature 97.8 F (36.6 C), temperature source Oral, resp. rate (!) 21, height 5' 5.5" (1.664 m), weight 88.9 kg, SpO2 98 %.  1.  General: Appears in no acute distress  2. Psychiatric: Alert, oriented x3, intact insight and judgment  3. Neurologic: Cranial nerves II through XII grossly intact, moving all extremities  4. HEENMT:  Atraumatic normocephalic, extraocular muscles are intact  5. Respiratory : Clear to auscultation bilaterally, no wheezing or crackles auscultated  6. Cardiovascular : S1-S2, regular, no murmur auscultated  7. Gastrointestinal:  Abdomen is soft, nontender, no organomegaly    Data Review:    CBC Recent Labs  Lab 05/19/19 2046  WBC 6.2  HGB 11.9*  HCT 38.7  PLT 165  MCV 94.6  MCH 29.1  MCHC 30.7  RDW 14.8   ------------------------------------------------------------------------------------------------------------------  Results for orders placed or performed during the hospital encounter of 05/19/19 (from the past 48 hour(s))  Basic metabolic panel     Status: Abnormal   Collection Time: 05/19/19  8:46 PM  Result Value Ref Range   Sodium 140 135 - 145  mmol/L   Potassium 3.8 3.5 - 5.1 mmol/L   Chloride 102 98 - 111 mmol/L   CO2 22 22 - 32 mmol/L   Glucose, Bld 172 (H) 70 - 99 mg/dL   BUN 17 8 - 23 mg/dL   Creatinine, Ser 1.21 (H) 0.44 - 1.00 mg/dL   Calcium 9.1  8.9 - 10.3 mg/dL   GFR calc non Af Amer 43 (L) >60 mL/min   GFR calc Af Amer 50 (L) >60 mL/min   Anion gap 16 (H) 5 - 15    Comment: Performed at [facility], [address], [place], [place] [address]  CBC     Status: Abnormal   Collection Time: 05/19/19  8:46 PM  Result Value Ref Range   WBC 6.2 4.0 - 10.5 K/uL   RBC 4.09 3.87 - 5.11 MIL/uL   Hemoglobin 11.9 (L) 12.0 - 15.0 g/dL   HCT 38.7 36.0 - 46.0 %   MCV 94.6 80.0 - 100.0 fL   MCH 29.1 26.0 - 34.0 pg   MCHC 30.7 30.0 - 36.0 g/dL   RDW 14.8 11.5 - 15.5 %   Platelets 165 150 - 400 K/uL   nRBC 0.0 0.0 - 0.2 %    Comment: Performed at [facility], [address] [place], [place] [address]  Lactic acid, plasma     Status: Abnormal   Collection Time: 05/19/19  8:46 PM  Result Value Ref Range   Lactic Acid, Venous 2.5 (HH) 0.5 - 1.9 mmol/L    Comment: CRITICAL RESULT CALLED TO, READ BACK BY AND VERIFIED WITH: WESTON,L ON 05/19/19 AT 2120 BY LOY,C Performed at [facility], [address] [place], [place] [address]   Troponin I (High Sensitivity)     Status: None   Collection Time: 05/19/19  8:46 PM  Result Value Ref Range   Troponin I (High Sensitivity) 6 <18 ng/L    Comment: (NOTE) Elevated high sensitivity troponin I (hsTnI) values and significant  changes across serial measurements may suggest ACS but many other  chronic and acute conditions are known to elevate hsTnI results.  Refer to the "Links" section for chest pain algorithms and additional  guidance. Performed at [facility], [address], [place], [place] [address]   CBG monitoring, ED     Status: Abnormal   Collection Time: 05/19/19  9:21 PM  Result Value Ref Range   Glucose-Capillary 152 (H) 70 - 99 mg/dL  Lactic acid, plasma     Status:  Abnormal   Collection Time: 05/19/19 10:30 PM  Result Value Ref Range   Lactic Acid, Venous 2.2 (HH) 0.5 - 1.9 mmol/L    Comment: CRITICAL RESULT CALLED TO, READ BACK BY AND VERIFIED WITH: L WESTON,RN @2318  05/19/19 MKELLY Performed at [facility], [address] [place], [place] [address]   Troponin I (High Sensitivity)     Status: None   Collection Time: 05/19/19 10:30 PM  Result Value Ref Range   Troponin I (High Sensitivity) 6 <18 ng/L    Comment: (NOTE) Elevated high sensitivity troponin I (hsTnI) values and significant  changes across serial measurements may suggest ACS but many other  chronic and acute conditions are known to elevate hsTnI results.  Refer to the "Links" section for chest pain algorithms and additional  guidance. Performed at [facility], [address] [place], [place] [address]   Urinalysis, Routine w reflex microscopic     Status: Abnormal   Collection Time: 05/19/19 10:44 PM  Result Value Ref Range   Color, Urine YELLOW YELLOW   APPearance CLOUDY (A) CLEAR   Specific Gravity, Urine 1.019 1.005 - 1.030   pH 5.0 5.0 - 8.0   Glucose, UA NEGATIVE NEGATIVE mg/dL   Hgb urine dipstick NEGATIVE NEGATIVE   Bilirubin Urine NEGATIVE NEGATIVE   Ketones, ur NEGATIVE NEGATIVE mg/dL  Protein, ur NEGATIVE NEGATIVE mg/dL   Nitrite NEGATIVE NEGATIVE   Leukocytes,Ua SMALL (A) NEGATIVE   RBC / HPF 0-5 0 - 5 RBC/hpf   WBC, UA 11-20 0 - 5 WBC/hpf   Bacteria, UA RARE (A) NONE SEEN   Squamous Epithelial / LPF 11-20 0 - 5   Mucus PRESENT    Hyaline Casts, UA PRESENT     Comment: Performed at [facility], [address], [place], [place] [address]    Chemistries  Recent Labs  Lab 05/19/19 2046  NA 140  K 3.8  CL 102  CO2 22  GLUCOSE 172*  BUN 17  CREATININE 1.21*  CALCIUM 9.1   ------------------------------------------------------------------------------------------------------------------   ------------------------------------------------------------------------------------------------------------------ GFR: Estimated Creatinine Clearance: 44 mL/min (A) (by C-G formula based on SCr of 1.21 mg/dL (H)). Liver Function Tests: No results for input(s): AST, ALT, ALKPHOS, BILITOT, PROT, ALBUMIN in the last 168 hours. No results for input(s): LIPASE, AMYLASE in the last 168 hours. No results for input(s): AMMONIA in the last 168 hours. Coagulation Profile: No results for input(s): INR, PROTIME in the last 168 hours. Cardiac Enzymes: No results for input(s): CKTOTAL, CKMB, CKMBINDEX, TROPONINI in the last 168 hours. BNP (last 3 results) No results for input(s): PROBNP in the last 8760 hours. HbA1C: No results for input(s): HGBA1C in the last 72 hours. CBG: Recent Labs  Lab 05/19/19 2121  GLUCAP 152*   Lipid Profile: No results for input(s): CHOL, HDL, LDLCALC, TRIG, CHOLHDL, LDLDIRECT in the last 72 hours. Thyroid Function Tests: No results for input(s): TSH, T4TOTAL, FREET4, T3FREE, THYROIDAB in the last 72 hours. Anemia Panel: No results for input(s): VITAMINB12, FOLATE, FERRITIN, TIBC, IRON, RETICCTPCT in the last 72 hours.  --------------------------------------------------------------------------------------------------------------- Urine analysis:    Component Value Date/Time   COLORURINE YELLOW 05/19/2019 2244   APPEARANCEUR CLOUDY (A) 05/19/2019 2244   LABSPEC 1.019 05/19/2019 2244   PHURINE 5.0 05/19/2019 2244   GLUCOSEU NEGATIVE 05/19/2019 2244   HGBUR NEGATIVE 05/19/2019 2244   BILIRUBINUR NEGATIVE 05/19/2019 2244   KETONESUR NEGATIVE 05/19/2019 2244   PROTEINUR NEGATIVE 05/19/2019 2244   UROBILINOGEN 0.2 08/10/2014 0054   NITRITE NEGATIVE 05/19/2019 2244   LEUKOCYTESUR SMALL (A) 05/19/2019 2244      Imaging Results:    DG Chest 2 View  Result Date: 05/19/2019 CLINICAL DATA:  Rule out detection EXAM: CHEST - 2 VIEW COMPARISON:  10/12/2018 FINDINGS:  The heart size and mediastinal contours are within normal limits. Both lungs are clear. The visualized skeletal structures are unremarkable. IMPRESSION: No active cardiopulmonary disease. Electronically Signed   By: Franchot Gallo M.D.   On: 05/19/2019 21:24   CT Head Wo Contrast  Result Date: 05/19/2019 CLINICAL DATA:  Altered level of consciousness. EXAM: CT HEAD WITHOUT CONTRAST TECHNIQUE: Contiguous axial images were obtained from the base of the skull through the vertex without intravenous contrast. COMPARISON:  CT head 05/14/2019 FINDINGS: Brain: Moderate atrophy. Moderate white matter disease bilaterally is stable. Negative for acute infarct, hemorrhage, mass. Vascular: Negative for hyperdense vessel Skull: Negative Sinuses/Orbits: Negative Other: Mild soft tissue swelling over the right maxilla. Gas in the right eyelid. Question laceration or contusion. IMPRESSION: Atrophy and chronic microvascular ischemia. Question right facial contusion. Electronically Signed   By: Franchot Gallo M.D.   On: 05/19/2019 21:28    My personal review of EKG: Rhythm NSR, no ST changes.    Assessment & Plan:    Active Problems:   Syncope   1. Syncope-unclear etiology, patient was found to be  extremely hypotensive by EMS 76/64 at home.  Patient is on labetalol, HCTZ, quinapril.  Will hold HCTZ and ACE inhibitor.  Will obtain orthostatic vital signs every shift.  Patient was recently mated to [facility] where she had work-up for syncope.  Will try to obtain records from [facility].  Would not order echo if it was done at [facility].  2. Acute kidney injury-patient baseline creatinine is 0.61 as of 2013.  Today creatinine is 1.21.  Unclear baseline.  Records requested from [facility].  Started on gentle IV hydration with normal saline at 100 mL/h.  3. Hypertension-we will continue with labetalol, will hold HCTZ/quinapril.  4. Diabetes mellitus type 2-hold Metformin, will start sliding scale insulin with  NovoLog.  5. Patient is on dual antiplatelet therapy, unclear indication.  We will continue both Plavix and aspirin at this time.  There is question of TIA during her recent work-up at [facility]    DVT Prophylaxis-   Lovenox   AM Labs Ordered, also please review Full Orders  Family Communication: Admission, patients condition and plan of care including tests being ordered have been discussed with the patient and her daughter at bedside who indicate understanding and agree with the plan and Code Status.  Code Status: Full code  Admission status: Inpatient: Based on patients clinical presentation and evaluation of above clinical data, I have made determination that patient meets Inpatient criteria at this time.  Time spent in minutes : 60 minutes   Nedim Oki S Graciano Batson M.D

## 2019-05-20 NOTE — Progress Notes (Signed)
PROGRESS NOTE    [name]  [id number] DOB: [birth year] DOA: 05/19/2019 PCP: Neale Burly, MD    Assessment & Plan:   Active Problems:   Syncope   [name]  is a 78 y.o. Caucasian female, with past medical history of COPD, diabetes mellitus, hypertension who came to ED with chief complaints of syncope.  Patient was sitting in chair daughter's house when suddenly she passed out for approximately 10 minutes.    # Syncope likely due to hypotension -First episode.  No prior hx of syncope.  patient was found to be extremely hypotensive by EMS 76/64 at home.  Patient is on labetalol, HCTZ, quinapril.  Review of tele records showed no arrhythmia.    --hold HCTZ and ACE inhibitor.    # Acute kidney injury -patient baseline creatinine is 0.61 as of 2013.  On presentation, creatinine is 1.21.  Unclear baseline.  Started on gentle IV hydration with normal saline at 100 mL/h.  # RLE cellulitis # Chronic venous stasis and stasis dermatitis --Start IV clinda today  # Hypertension -continue with labetalol --hold HCTZ/quinapril.  # Diabetes mellitus type 2-hold Metformin, sliding scale insulin with NovoLog.  # Patient is on dual antiplatelet therapy, unclear indication.   --continue both Plavix and aspirin at this time.  There is question of TIA during her recent work-up at [facility].   DVT prophylaxis: Lovenox SQ Code Status: Full code  Family Communication: none today Disposition Plan: tomorrow    Subjective: No more syncope episode.  No dizziness, light headedness.  Pt has been up and walking without difficulty, though felt weak, especially in her right leg.  No fever, dyspnea, chest pain, abdominal pain, N/V/D, dysuria.  Pt mentioned chronic swelling in her legs, R>L.     Objective: Vitals:   05/20/19 0030 05/20/19 0225 05/20/19 0631 05/20/19 1351  BP: (!) 164/83 112/82 (!) 151/80 138/70  Pulse: 66 66 72 69  Resp: 18 (!) 21    Temp:  97.8 F (36.6 C)  98.6 F (37 C)   TempSrc:  Oral Oral   SpO2: 96% 98% 97%   Weight:  88.9 kg    Height:        Intake/Output Summary (Last 24 hours) at 05/20/2019 2056 Last data filed at 05/20/2019 1100 Gross per 24 hour  Intake 1480 ml  Output --  Net 1480 ml   Filed Weights   05/19/19 1955 05/20/19 0225  Weight: 87.5 kg 88.9 kg    Examination:  Constitutional: NAD, AAOx3 HEENT: conjunctivae and lids normal, EOMI CV: RRR no M,R,G. Distal pulses +2.  No cyanosis.   RESP: CTA B/L, normal respiratory effort  GI: +BS, NTND Extremities: RLE erythematous, more edematous, and felt warm, see pic SKIN: warm, dry Neuro: II - XII grossly intact.  Sensation intact Psych: Normal mood and affect.  Appropriate judgement and reason      Data Reviewed: I have personally reviewed following labs and imaging studies  CBC: Recent Labs  Lab 05/19/19 2046 05/20/19 0510  WBC 6.2 5.0  HGB 11.9* 10.8*  HCT 38.7 33.6*  MCV 94.6 92.6  PLT 165 [id number]   Basic Metabolic Panel: Recent Labs  Lab 05/19/19 2046 05/20/19 0510  NA 140 141  K 3.8 3.4*  CL 102 106  CO2 22 24  GLUCOSE 172* 156*  BUN 17 18  CREATININE 1.21* 0.98  CALCIUM 9.1 8.6*   GFR: Estimated Creatinine Clearance: 54.4 mL/min (by C-G formula based on SCr of 0.98  mg/dL). Liver Function Tests: Recent Labs  Lab 05/20/19 0510  AST 18  ALT 16  ALKPHOS 64  BILITOT 0.5  PROT 6.1*  ALBUMIN 3.5   No results for input(s): LIPASE, AMYLASE in the last 168 hours. No results for input(s): AMMONIA in the last 168 hours. Coagulation Profile: No results for input(s): INR, PROTIME in the last 168 hours. Cardiac Enzymes: No results for input(s): CKTOTAL, CKMB, CKMBINDEX, TROPONINI in the last 168 hours. BNP (last 3 results) No results for input(s): PROBNP in the last 8760 hours. HbA1C: Recent Labs    05/19/19 2349  HGBA1C 6.4*   CBG: Recent Labs  Lab 05/19/19 2121 05/20/19 0814 05/20/19 1133 05/20/19 1613  GLUCAP 152* 91 107*  99   Lipid Profile: No results for input(s): CHOL, HDL, LDLCALC, TRIG, CHOLHDL, LDLDIRECT in the last 72 hours. Thyroid Function Tests: No results for input(s): TSH, T4TOTAL, FREET4, T3FREE, THYROIDAB in the last 72 hours. Anemia Panel: No results for input(s): VITAMINB12, FOLATE, FERRITIN, TIBC, IRON, RETICCTPCT in the last 72 hours. Sepsis Labs: Recent Labs  Lab 05/19/19 2046 05/19/19 2230  LATICACIDVEN 2.5* 2.2*    Recent Results (from the past 240 hour(s))  SARS CORONAVIRUS 2 (TAT 6-24 HRS) Nasopharyngeal Nasopharyngeal Swab     Status: None   Collection Time: 05/19/19 11:11 PM   Specimen: Nasopharyngeal Swab  Result Value Ref Range Status   SARS Coronavirus 2 NEGATIVE NEGATIVE Final    Comment: (NOTE) SARS-CoV-2 target nucleic acids are NOT DETECTED. The SARS-CoV-2 RNA is generally detectable in upper and lower respiratory specimens during the acute phase of infection. Negative results do not preclude SARS-CoV-2 infection, do not rule out co-infections with other pathogens, and should not be used as the sole basis for treatment or other patient management decisions. Negative results must be combined with clinical observations, patient history, and epidemiological information. The expected result is Negative. Fact Sheet for Patients: SugarRoll.be Fact Sheet for Healthcare Providers: https://www.woods-mathews.com/ This test is not yet approved or cleared by the [place] FDA and  has been authorized for detection and/or diagnosis of SARS-CoV-2 by FDA under an Emergency Use Authorization (EUA). This EUA will remain  in effect (meaning this test can be used) for the duration of the COVID-19 declaration under Section 56 4(b)(1) of the Act, 21 U.S.C. section 360bbb-3(b)(1), unless the authorization is terminated or revoked sooner. Performed at [facility], [place] [address], [place], [place] [address]   Culture, blood  (routine x 2)     Status: None (Preliminary result)   Collection Time: 05/19/19 11:49 PM   Specimen: BLOOD RIGHT HAND  Result Value Ref Range Status   Specimen Description BLOOD RIGHT HAND  Final   Special Requests   Final    BOTTLES DRAWN AEROBIC AND ANAEROBIC Blood Culture adequate volume   Culture   Final    NO GROWTH < 12 HOURS Performed at [facility], [address] [place], [place] [address]    Report Status PENDING  Incomplete  Culture, blood (routine x 2)     Status: None (Preliminary result)   Collection Time: 05/20/19  1:10 AM   Specimen: BLOOD RIGHT HAND  Result Value Ref Range Status   Specimen Description BLOOD RIGHT HAND  Final   Special Requests   Final    BOTTLES DRAWN AEROBIC AND ANAEROBIC Blood Culture adequate volume   Culture   Final    NO GROWTH < 12 HOURS Performed at [facility], [address], [place],  [place] [address]    Report Status PENDING  Incomplete         Radiology Studies: DG Chest 2 View  Result Date: 05/19/2019 CLINICAL DATA:  Rule out detection EXAM: CHEST - 2 VIEW COMPARISON:  10/12/2018 FINDINGS: The heart size and mediastinal contours are within normal limits. Both lungs are clear. The visualized skeletal structures are unremarkable. IMPRESSION: No active cardiopulmonary disease. Electronically Signed   By: Franchot Gallo M.D.   On: 05/19/2019 21:24   CT Head Wo Contrast  Result Date: 05/19/2019 CLINICAL DATA:  Altered level of consciousness. EXAM: CT HEAD WITHOUT CONTRAST TECHNIQUE: Contiguous axial images were obtained from the base of the skull through the vertex without intravenous contrast. COMPARISON:  CT head 05/14/2019 FINDINGS: Brain: Moderate atrophy. Moderate white matter disease bilaterally is stable. Negative for acute infarct, hemorrhage, mass. Vascular: Negative for hyperdense vessel Skull: Negative Sinuses/Orbits: Negative Other: Mild soft tissue swelling over the right maxilla. Gas in the right eyelid. Question  laceration or contusion. IMPRESSION: Atrophy and chronic microvascular ischemia. Question right facial contusion. Electronically Signed   By: Franchot Gallo M.D.   On: 05/19/2019 21:28        Scheduled Meds: . aspirin EC  81 mg Oral Daily  . clopidogrel  75 mg Oral Daily  . enoxaparin (LOVENOX) injection  40 mg Subcutaneous Q24H  . insulin aspart  0-9 Units Subcutaneous TID WC  . labetalol  300 mg Oral BID  . multivitamin with minerals  1 tablet Oral Daily  . vitamin C  500 mg Oral Daily   Continuous Infusions: . clindamycin (CLEOCIN) IV 600 mg (05/20/19 2100)     LOS: 0 days    Enzo Bi, MD [facility] If 7PM-7AM, please contact night-coverage 05/20/2019, 8:56 PM

## 2019-05-21 LAB — LACTIC ACID, PLASMA: Lactic Acid, Venous: 1.4 mmol/L (ref 0.5–1.9)

## 2019-05-21 LAB — CBC
HCT: 34.2 % — ABNORMAL LOW (ref 36.0–46.0)
Hemoglobin: 10.4 g/dL — ABNORMAL LOW (ref 12.0–15.0)
MCH: 28.7 pg (ref 26.0–34.0)
MCHC: 30.4 g/dL (ref 30.0–36.0)
MCV: 94.2 fL (ref 80.0–100.0)
Platelets: 182 10*3/uL (ref 150–400)
RBC: 3.63 MIL/uL — ABNORMAL LOW (ref 3.87–5.11)
RDW: 14.9 % (ref 11.5–15.5)
WBC: 4 10*3/uL (ref 4.0–10.5)
nRBC: 0 % (ref 0.0–0.2)

## 2019-05-21 LAB — BASIC METABOLIC PANEL
Anion gap: 8 (ref 5–15)
BUN: 16 mg/dL (ref 8–23)
CO2: 29 mmol/L (ref 22–32)
Calcium: 9.1 mg/dL (ref 8.9–10.3)
Chloride: 105 mmol/L (ref 98–111)
Creatinine, Ser: 0.91 mg/dL (ref 0.44–1.00)
GFR calc Af Amer: >60 mL/min (ref >60)
GFR calc non Af Amer: >60 mL/min (ref >60)
Glucose, Bld: 140 mg/dL — ABNORMAL HIGH (ref 70–99)
Potassium: 4.1 mmol/L (ref 3.5–5.1)
Sodium: 142 mmol/L (ref 135–145)

## 2019-05-21 LAB — GLUCOSE, CAPILLARY
Glucose-Capillary: 90 mg/dL (ref 70–99)
Glucose-Capillary: 126 mg/dL — ABNORMAL HIGH (ref 70–99)
Glucose-Capillary: 99 mg/dL (ref 70–99)

## 2019-05-21 LAB — MAGNESIUM: Magnesium: 2.1 mg/dL (ref 1.7–2.4)

## 2019-05-21 MED ORDER — CEPHALEXIN 500 MG PO CAPS: 500.0000 mg | ORAL_CAPSULE | Freq: Two times a day (BID) | ORAL | 0 refills | Status: AC

## 2019-05-21 MED ORDER — BENAZEPRIL-HYDROCHLOROTHIAZIDE 20-25 MG PO TABS: ORAL_TABLET | ORAL | Status: AC

## 2019-05-21 MED ORDER — CEPHALEXIN 500 MG PO CAPS
500.0000 mg | ORAL_CAPSULE | Freq: Two times a day (BID) | ORAL | Status: DC
  Administered 2019-05-21: 500 mg via ORAL
  Filled 2019-05-21: qty 1

## 2019-05-21 NOTE — Progress Notes (Signed)
Pt received discharge summary. Reviewed new antibiotic with patient and how she will take. Also reviewed with patient that MD advised to hold BP meds but to record BP three times a day. IV was removed by off going nurse. All patient belongings and home meds are with patient. Pt verbalizes understanding of discharge orders.

## 2019-05-21 NOTE — Discharge Summary (Signed)
Physician Discharge Summary   [name]  female DOB: 12-Jul-1940  [id number]  PCP: Neale Burly, MD  Admit date: 05/19/2019 Discharge date: 05/26/2019  Admitted From: home Disposition:  home  Discharge Instructions:  Discharge Instructions    Diet - low sodium heart healthy   Complete by: As directed    Discharge instructions   Complete by: As directed    You will take antibiotic Keflex twice a day for 10 more days for your right leg cellulitis.    We think you may have passed out because your blood pressure was too low.  Please hold your Lotensin (benazepril-hydrochlorthiazide combo pill) until you follow up with you PCP in 2 weeks.  During the 2 weeks, please check your blood pressure 3 times a day and record them for your PCP's review.  You will continue to take your other blood pressure medication Labetalol.  Dr. Enzo Bi      Discharge Condition: Stable CODE STATUS: Full code   Hospital Course:  For full details, please see H&P, progress notes, consult notes and ancillary notes.  Briefly,  [name] a78 y.o.Caucasian female,with past medical history of COPD, diabetes mellitus, hypertension who came to ED with chief complaints of syncope. Patient was sitting in chair daughter's house when suddenly she passed out for approximately 10 minutes.    # Syncope likely due to hypotension First episode.  No prior hx of syncope.  patient was found to be extremely hypotensive by EMS 76/64 on arrival. Patient is on labetalol, HCTZ, quinapril.  Review of tele records showed no arrhythmia.  Home HCTZ and ACE inhibitor were held while hospitalized, and will be held after discharge until PCP followup.  Pt was advised to check blood pressure 3 times a day and record them for PCP's review.  Home Labetalol is continued.  # Acute kidney injury, improved Patient baseline creatinine is 0.61 as of 2013. On presentation, creatinine is 1.21. Started on gentle IV  hydration with normal saline at 100 mL/h.  Cr improved to 0.91 on the day of discharge.  AKI likely due to hypotension and/or volume depletion from home BP medications.  # RLE cellulitis, POA # Chronic venous stasis and stasis dermatitis This was not one of pt's presenting complaint, but was noted on exam.  RLE was significantly more erythematous, edematous and warm to the touch.  Pt received 1 full day of IV clinda with already some improvement with decrease in erythema, edema and no more warmth.  Pt didn't want to stay inpatient longer, so was discharged on Keflex twice a day for 10 days.  # Hx of Hypertension Pt presented with hypotension, so her home BP regimen may be too much for her now.  Pt was continued with labetalol, but home HCTZ/quinapril is held until PCP followup.  Pt was advised to check blood pressure 3 times a day and record them for PCP's review.  # Diabetes mellitus type 2 Metformin held, and pt received sliding scale insulin with NovoLog.  # On dual antiplatelet therapy, unclear indication Continued both Plavix and aspirin at this time. There is question of TIA during her recent work-up at [facility].   Discharge Diagnoses:  Active Problems:   Syncope    Allergies as of 05/21/2019      Reactions   Ciprofloxacin Rash      Medication List    STOP taking these medications   diclofenac sodium 1 % Gel Commonly known as: VOLTAREN   oxyCODONE-acetaminophen 5-325  MG tablet Commonly known as: PERCOCET/ROXICET     TAKE these medications   aspirin EC 81 MG tablet Take 81 mg by mouth daily.   benazepril-hydrochlorthiazide 20-25 MG tablet Commonly known as: LOTENSIN HCT Hold this medication until you follow up with PCP because your blood pressure was too low. What changed:   how much to take  how to take this  when to take this  additional instructions   cephALEXin 500 MG capsule Commonly known as: KEFLEX Take 1 capsule (500 mg total) by  mouth every 12 (twelve) hours for 10 days. This is antibiotic for your cellulitis infection.   clopidogrel 75 MG tablet Commonly known as: PLAVIX Take 75 mg by mouth daily.   fish oil-omega-3 fatty acids 1000 MG capsule Take 2 g by mouth daily.   gabapentin 300 MG capsule Commonly known as: NEURONTIN Take 300 mg by mouth 3 (three) times daily.   Iron 325 (65 Fe) MG Tabs Take 65 mg by mouth. Occasionally   labetalol 300 MG tablet Commonly known as: NORMODYNE Take 300 mg by mouth 2 (two) times daily.   metFORMIN 500 MG tablet Commonly known as: GLUCOPHAGE Take 1 tablet by mouth 2 (two) times daily.   multivitamin tablet Take 1 tablet by mouth daily.   vitamin C 500 MG tablet Commonly known as: ASCORBIC ACID Take 500 mg by mouth daily.   VITAMIN D3 COMPLETE PO Take 1 tablet by mouth daily.   VITAMIN E PO Take 1 tablet by mouth daily.         Allergies  Allergen Reactions  . Ciprofloxacin Rash     The results of significant diagnostics from this hospitalization (including imaging, microbiology, ancillary and laboratory) are listed below for reference.   Consultations:   Procedures/Studies: DG Chest 2 View  Result Date: 05/19/2019 CLINICAL DATA:  Rule out detection EXAM: CHEST - 2 VIEW COMPARISON:  10/12/2018 FINDINGS: The heart size and mediastinal contours are within normal limits. Both lungs are clear. The visualized skeletal structures are unremarkable. IMPRESSION: No active cardiopulmonary disease. Electronically Signed   By: Franchot Gallo M.D.   On: 05/19/2019 21:24   CT Head Wo Contrast  Result Date: 05/19/2019 CLINICAL DATA:  Altered level of consciousness. EXAM: CT HEAD WITHOUT CONTRAST TECHNIQUE: Contiguous axial images were obtained from the base of the skull through the vertex without intravenous contrast. COMPARISON:  CT head 05/14/2019 FINDINGS: Brain: Moderate atrophy. Moderate white matter disease bilaterally is stable. Negative for acute  infarct, hemorrhage, mass. Vascular: Negative for hyperdense vessel Skull: Negative Sinuses/Orbits: Negative Other: Mild soft tissue swelling over the right maxilla. Gas in the right eyelid. Question laceration or contusion. IMPRESSION: Atrophy and chronic microvascular ischemia. Question right facial contusion. Electronically Signed   By: Franchot Gallo M.D.   On: 05/19/2019 21:28      Labs: BNP (last 3 results) No results for input(s): BNP in the last 8760 hours. Basic Metabolic Panel: Recent Labs  Lab 05/19/19 2046 05/20/19 0510 05/21/19 1336  NA 140 141 142  K 3.8 3.4* 4.1  CL 102 106 105  CO2 22 24 29   GLUCOSE 172* 156* 140*  BUN 17 18 16   CREATININE 1.21* 0.98 0.91  CALCIUM 9.1 8.6* 9.1  MG  --   --  2.1   Liver Function Tests: Recent Labs  Lab 05/20/19 0510  AST 18  ALT 16  ALKPHOS 64  BILITOT 0.5  PROT 6.1*  ALBUMIN 3.5   No results for input(s): LIPASE, AMYLASE  in the last 168 hours. No results for input(s): AMMONIA in the last 168 hours. CBC: Recent Labs  Lab 05/19/19 2046 05/20/19 0510 05/21/19 1336  WBC 6.2 5.0 4.0  HGB 11.9* 10.8* 10.4*  HCT 38.7 33.6* 34.2*  MCV 94.6 92.6 94.2  PLT 165 173 182   Cardiac Enzymes: No results for input(s): CKTOTAL, CKMB, CKMBINDEX, TROPONINI in the last 168 hours. BNP: Invalid input(s): POCBNP CBG: Recent Labs  Lab 05/20/19 1613 05/20/19 2109 05/21/19 0756 05/21/19 1156 05/21/19 1635  GLUCAP 99 112* 90 126* 99   D-Dimer No results for input(s): DDIMER in the last 72 hours. Hgb A1c No results for input(s): HGBA1C in the last 72 hours. Lipid Profile No results for input(s): CHOL, HDL, LDLCALC, TRIG, CHOLHDL, LDLDIRECT in the last 72 hours. Thyroid function studies No results for input(s): TSH, T4TOTAL, T3FREE, THYROIDAB in the last 72 hours.  Invalid input(s): FREET3 Anemia work up No results for input(s): VITAMINB12, FOLATE, FERRITIN, TIBC, IRON, RETICCTPCT in the last 72 hours. Urinalysis      Component Value Date/Time   COLORURINE YELLOW 05/19/2019 2244   APPEARANCEUR CLOUDY (A) 05/19/2019 2244   LABSPEC 1.019 05/19/2019 2244   PHURINE 5.0 05/19/2019 2244   GLUCOSEU NEGATIVE 05/19/2019 2244   HGBUR NEGATIVE 05/19/2019 2244   BILIRUBINUR NEGATIVE 05/19/2019 2244   KETONESUR NEGATIVE 05/19/2019 2244   PROTEINUR NEGATIVE 05/19/2019 2244   UROBILINOGEN 0.2 08/10/2014 0054   NITRITE NEGATIVE 05/19/2019 2244   LEUKOCYTESUR SMALL (A) 05/19/2019 2244   Sepsis Labs Invalid input(s): PROCALCITONIN,  WBC,  LACTICIDVEN Microbiology Recent Results (from the past 240 hour(s))  SARS CORONAVIRUS 2 (TAT 6-24 HRS) Nasopharyngeal Nasopharyngeal Swab     Status: None   Collection Time: 05/19/19 11:11 PM   Specimen: Nasopharyngeal Swab  Result Value Ref Range Status   SARS Coronavirus 2 NEGATIVE NEGATIVE Final    Comment: (NOTE) SARS-CoV-2 target nucleic acids are NOT DETECTED. The SARS-CoV-2 RNA is generally detectable in upper and lower respiratory specimens during the acute phase of infection. Negative results do not preclude SARS-CoV-2 infection, do not rule out co-infections with other pathogens, and should not be used as the sole basis for treatment or other patient management decisions. Negative results must be combined with clinical observations, patient history, and epidemiological information. The expected result is Negative. Fact Sheet for Patients: SugarRoll.be Fact Sheet for Healthcare Providers: https://www.woods-mathews.com/ This test is not yet approved or cleared by the [place] FDA and  has been authorized for detection and/or diagnosis of SARS-CoV-2 by FDA under an Emergency Use Authorization (EUA). This EUA will remain  in effect (meaning this test can be used) for the duration of the COVID-19 declaration under Section 56 4(b)(1) of the Act, 21 U.S.C. section 360bbb-3(b)(1), unless the authorization is terminated  or revoked sooner. Performed at [facility], [place] [address] [place], [place] [address]   Culture, blood (routine x 2)     Status: None   Collection Time: 05/19/19 11:49 PM   Specimen: BLOOD RIGHT HAND  Result Value Ref Range Status   Specimen Description BLOOD RIGHT HAND  Final   Special Requests   Final    BOTTLES DRAWN AEROBIC AND ANAEROBIC Blood Culture adequate volume   Culture   Final    NO GROWTH 5 DAYS Performed at [facility], [address] [place], [place] [address]    Report Status 05/25/2019 FINAL  Final  Culture, blood (routine x 2)     Status: None  Collection Time: 05/20/19  1:10 AM   Specimen: BLOOD RIGHT HAND  Result Value Ref Range Status   Specimen Description BLOOD RIGHT HAND  Final   Special Requests   Final    BOTTLES DRAWN AEROBIC AND ANAEROBIC Blood Culture adequate volume   Culture   Final    NO GROWTH 5 DAYS Performed at [facility], [address] [place], [place] [address]    Report Status 05/25/2019 FINAL  Final     Total time spend on discharging this patient, including the last patient exam, discussing the hospital stay, instructions for ongoing care as it relates to all pertinent caregivers, as well as preparing the medical discharge records, prescriptions, and/or referrals as applicable, is 35 minutes.    Enzo Bi, MD  [facility] 05/26/2019, 6:29 PM  If 7PM-7AM, please contact night-coverage

## 2019-05-21 NOTE — Care Management Important Message (Signed)
Important Message  Patient Details  Name: [name] MRN: [id number] Date of Birth: [birth year]   Medicare Important Message Given:  Yes     [name] Medal 05/21/2019, 3:41 PM

## 2019-05-25 LAB — CULTURE, BLOOD (ROUTINE X 2)
Culture: NO GROWTH
Culture: NO GROWTH
Special Requests: ADEQUATE
Special Requests: ADEQUATE

## 2019-05-31 DIAGNOSIS — E860 Dehydration: Secondary | ICD-10-CM | POA: Diagnosis not present

## 2019-05-31 DIAGNOSIS — I959 Hypotension, unspecified: Secondary | ICD-10-CM | POA: Diagnosis not present

## 2019-06-07 DIAGNOSIS — E7849 Other hyperlipidemia: Secondary | ICD-10-CM | POA: Diagnosis not present

## 2019-06-07 DIAGNOSIS — I10 Essential (primary) hypertension: Secondary | ICD-10-CM | POA: Diagnosis not present

## 2019-06-07 DIAGNOSIS — E119 Type 2 diabetes mellitus without complications: Secondary | ICD-10-CM | POA: Diagnosis not present

## 2019-06-17 DIAGNOSIS — L658 Other specified nonscarring hair loss: Secondary | ICD-10-CM | POA: Diagnosis not present

## 2019-07-01 DIAGNOSIS — E7849 Other hyperlipidemia: Secondary | ICD-10-CM | POA: Diagnosis not present

## 2019-07-01 DIAGNOSIS — I10 Essential (primary) hypertension: Secondary | ICD-10-CM | POA: Diagnosis not present

## 2019-07-01 DIAGNOSIS — E119 Type 2 diabetes mellitus without complications: Secondary | ICD-10-CM | POA: Diagnosis not present

## 2019-07-20 DIAGNOSIS — Z0000 Encounter for general adult medical examination without abnormal findings: Secondary | ICD-10-CM | POA: Diagnosis not present

## 2019-07-20 DIAGNOSIS — E119 Type 2 diabetes mellitus without complications: Secondary | ICD-10-CM | POA: Diagnosis not present

## 2019-07-20 DIAGNOSIS — E7849 Other hyperlipidemia: Secondary | ICD-10-CM | POA: Diagnosis not present

## 2019-07-20 DIAGNOSIS — G622 Polyneuropathy due to other toxic agents: Secondary | ICD-10-CM | POA: Diagnosis not present

## 2019-07-20 DIAGNOSIS — I10 Essential (primary) hypertension: Secondary | ICD-10-CM | POA: Diagnosis not present

## 2019-07-20 DIAGNOSIS — Z1389 Encounter for screening for other disorder: Secondary | ICD-10-CM | POA: Diagnosis not present

## 2019-07-27 DIAGNOSIS — G622 Polyneuropathy due to other toxic agents: Secondary | ICD-10-CM | POA: Diagnosis not present

## 2019-07-27 DIAGNOSIS — M25561 Pain in right knee: Secondary | ICD-10-CM | POA: Diagnosis not present

## 2019-07-27 DIAGNOSIS — M25562 Pain in left knee: Secondary | ICD-10-CM | POA: Diagnosis not present

## 2019-07-27 DIAGNOSIS — M799 Soft tissue disorder, unspecified: Secondary | ICD-10-CM | POA: Diagnosis not present

## 2019-07-27 DIAGNOSIS — M6281 Muscle weakness (generalized): Secondary | ICD-10-CM | POA: Diagnosis not present

## 2019-07-30 DIAGNOSIS — G622 Polyneuropathy due to other toxic agents: Secondary | ICD-10-CM | POA: Diagnosis not present

## 2019-07-30 DIAGNOSIS — M6281 Muscle weakness (generalized): Secondary | ICD-10-CM | POA: Diagnosis not present

## 2019-07-30 DIAGNOSIS — M25562 Pain in left knee: Secondary | ICD-10-CM | POA: Diagnosis not present

## 2019-07-30 DIAGNOSIS — M799 Soft tissue disorder, unspecified: Secondary | ICD-10-CM | POA: Diagnosis not present

## 2019-07-30 DIAGNOSIS — M25561 Pain in right knee: Secondary | ICD-10-CM | POA: Diagnosis not present

## 2019-08-03 DIAGNOSIS — M799 Soft tissue disorder, unspecified: Secondary | ICD-10-CM | POA: Diagnosis not present

## 2019-08-03 DIAGNOSIS — M25561 Pain in right knee: Secondary | ICD-10-CM | POA: Diagnosis not present

## 2019-08-03 DIAGNOSIS — M6281 Muscle weakness (generalized): Secondary | ICD-10-CM | POA: Diagnosis not present

## 2019-08-03 DIAGNOSIS — G622 Polyneuropathy due to other toxic agents: Secondary | ICD-10-CM | POA: Diagnosis not present

## 2019-08-03 DIAGNOSIS — M25562 Pain in left knee: Secondary | ICD-10-CM | POA: Diagnosis not present

## 2019-08-06 DIAGNOSIS — G622 Polyneuropathy due to other toxic agents: Secondary | ICD-10-CM | POA: Diagnosis not present

## 2019-08-06 DIAGNOSIS — M799 Soft tissue disorder, unspecified: Secondary | ICD-10-CM | POA: Diagnosis not present

## 2019-08-06 DIAGNOSIS — M6281 Muscle weakness (generalized): Secondary | ICD-10-CM | POA: Diagnosis not present

## 2019-08-06 DIAGNOSIS — M25561 Pain in right knee: Secondary | ICD-10-CM | POA: Diagnosis not present

## 2019-08-06 DIAGNOSIS — M25562 Pain in left knee: Secondary | ICD-10-CM | POA: Diagnosis not present

## 2019-08-09 DIAGNOSIS — M25562 Pain in left knee: Secondary | ICD-10-CM | POA: Diagnosis not present

## 2019-08-09 DIAGNOSIS — M799 Soft tissue disorder, unspecified: Secondary | ICD-10-CM | POA: Diagnosis not present

## 2019-08-09 DIAGNOSIS — M6281 Muscle weakness (generalized): Secondary | ICD-10-CM | POA: Diagnosis not present

## 2019-08-09 DIAGNOSIS — M25561 Pain in right knee: Secondary | ICD-10-CM | POA: Diagnosis not present

## 2019-08-09 DIAGNOSIS — G622 Polyneuropathy due to other toxic agents: Secondary | ICD-10-CM | POA: Diagnosis not present

## 2019-08-11 DIAGNOSIS — M25561 Pain in right knee: Secondary | ICD-10-CM | POA: Diagnosis not present

## 2019-08-11 DIAGNOSIS — M25562 Pain in left knee: Secondary | ICD-10-CM | POA: Diagnosis not present

## 2019-08-11 DIAGNOSIS — M799 Soft tissue disorder, unspecified: Secondary | ICD-10-CM | POA: Diagnosis not present

## 2019-08-11 DIAGNOSIS — G622 Polyneuropathy due to other toxic agents: Secondary | ICD-10-CM | POA: Diagnosis not present

## 2019-08-11 DIAGNOSIS — M6281 Muscle weakness (generalized): Secondary | ICD-10-CM | POA: Diagnosis not present

## 2019-08-12 DIAGNOSIS — E119 Type 2 diabetes mellitus without complications: Secondary | ICD-10-CM | POA: Diagnosis not present

## 2019-08-12 DIAGNOSIS — I10 Essential (primary) hypertension: Secondary | ICD-10-CM | POA: Diagnosis not present

## 2019-08-12 DIAGNOSIS — E7849 Other hyperlipidemia: Secondary | ICD-10-CM | POA: Diagnosis not present

## 2019-08-16 DIAGNOSIS — G622 Polyneuropathy due to other toxic agents: Secondary | ICD-10-CM | POA: Diagnosis not present

## 2019-08-16 DIAGNOSIS — M25561 Pain in right knee: Secondary | ICD-10-CM | POA: Diagnosis not present

## 2019-08-16 DIAGNOSIS — M799 Soft tissue disorder, unspecified: Secondary | ICD-10-CM | POA: Diagnosis not present

## 2019-08-16 DIAGNOSIS — M6281 Muscle weakness (generalized): Secondary | ICD-10-CM | POA: Diagnosis not present

## 2019-08-16 DIAGNOSIS — M25562 Pain in left knee: Secondary | ICD-10-CM | POA: Diagnosis not present

## 2019-08-19 DIAGNOSIS — G622 Polyneuropathy due to other toxic agents: Secondary | ICD-10-CM | POA: Diagnosis not present

## 2019-08-19 DIAGNOSIS — M25561 Pain in right knee: Secondary | ICD-10-CM | POA: Diagnosis not present

## 2019-08-19 DIAGNOSIS — M25562 Pain in left knee: Secondary | ICD-10-CM | POA: Diagnosis not present

## 2019-08-19 DIAGNOSIS — M6281 Muscle weakness (generalized): Secondary | ICD-10-CM | POA: Diagnosis not present

## 2019-08-19 DIAGNOSIS — M799 Soft tissue disorder, unspecified: Secondary | ICD-10-CM | POA: Diagnosis not present

## 2019-08-23 DIAGNOSIS — G622 Polyneuropathy due to other toxic agents: Secondary | ICD-10-CM | POA: Diagnosis not present

## 2019-08-23 DIAGNOSIS — M25562 Pain in left knee: Secondary | ICD-10-CM | POA: Diagnosis not present

## 2019-08-23 DIAGNOSIS — M25561 Pain in right knee: Secondary | ICD-10-CM | POA: Diagnosis not present

## 2019-08-23 DIAGNOSIS — M6281 Muscle weakness (generalized): Secondary | ICD-10-CM | POA: Diagnosis not present

## 2019-08-23 DIAGNOSIS — M799 Soft tissue disorder, unspecified: Secondary | ICD-10-CM | POA: Diagnosis not present

## 2019-08-25 DIAGNOSIS — M25561 Pain in right knee: Secondary | ICD-10-CM | POA: Diagnosis not present

## 2019-08-25 DIAGNOSIS — G622 Polyneuropathy due to other toxic agents: Secondary | ICD-10-CM | POA: Diagnosis not present

## 2019-08-25 DIAGNOSIS — M25562 Pain in left knee: Secondary | ICD-10-CM | POA: Diagnosis not present

## 2019-08-25 DIAGNOSIS — M6281 Muscle weakness (generalized): Secondary | ICD-10-CM | POA: Diagnosis not present

## 2019-08-25 DIAGNOSIS — M799 Soft tissue disorder, unspecified: Secondary | ICD-10-CM | POA: Diagnosis not present

## 2019-08-30 DIAGNOSIS — M6281 Muscle weakness (generalized): Secondary | ICD-10-CM | POA: Diagnosis not present

## 2019-08-30 DIAGNOSIS — G622 Polyneuropathy due to other toxic agents: Secondary | ICD-10-CM | POA: Diagnosis not present

## 2019-08-30 DIAGNOSIS — M25562 Pain in left knee: Secondary | ICD-10-CM | POA: Diagnosis not present

## 2019-08-30 DIAGNOSIS — M799 Soft tissue disorder, unspecified: Secondary | ICD-10-CM | POA: Diagnosis not present

## 2019-08-30 DIAGNOSIS — M25561 Pain in right knee: Secondary | ICD-10-CM | POA: Diagnosis not present

## 2019-09-01 DIAGNOSIS — M25562 Pain in left knee: Secondary | ICD-10-CM | POA: Diagnosis not present

## 2019-09-01 DIAGNOSIS — M799 Soft tissue disorder, unspecified: Secondary | ICD-10-CM | POA: Diagnosis not present

## 2019-09-01 DIAGNOSIS — M25561 Pain in right knee: Secondary | ICD-10-CM | POA: Diagnosis not present

## 2019-09-01 DIAGNOSIS — M6281 Muscle weakness (generalized): Secondary | ICD-10-CM | POA: Diagnosis not present

## 2019-09-01 DIAGNOSIS — G622 Polyneuropathy due to other toxic agents: Secondary | ICD-10-CM | POA: Diagnosis not present

## 2019-09-05 DIAGNOSIS — R531 Weakness: Secondary | ICD-10-CM | POA: Diagnosis not present

## 2019-09-05 DIAGNOSIS — R29810 Facial weakness: Secondary | ICD-10-CM | POA: Diagnosis not present

## 2019-09-05 DIAGNOSIS — I629 Nontraumatic intracranial hemorrhage, unspecified: Secondary | ICD-10-CM | POA: Diagnosis not present

## 2019-09-05 DIAGNOSIS — J449 Chronic obstructive pulmonary disease, unspecified: Secondary | ICD-10-CM | POA: Diagnosis not present

## 2019-09-05 DIAGNOSIS — M48061 Spinal stenosis, lumbar region without neurogenic claudication: Secondary | ICD-10-CM | POA: Diagnosis not present

## 2019-09-05 DIAGNOSIS — R9082 White matter disease, unspecified: Secondary | ICD-10-CM | POA: Diagnosis not present

## 2019-09-05 DIAGNOSIS — Z7902 Long term (current) use of antithrombotics/antiplatelets: Secondary | ICD-10-CM | POA: Diagnosis not present

## 2019-09-05 DIAGNOSIS — M79604 Pain in right leg: Secondary | ICD-10-CM | POA: Diagnosis not present

## 2019-09-05 DIAGNOSIS — Z85828 Personal history of other malignant neoplasm of skin: Secondary | ICD-10-CM | POA: Diagnosis not present

## 2019-09-05 DIAGNOSIS — R112 Nausea with vomiting, unspecified: Secondary | ICD-10-CM | POA: Diagnosis not present

## 2019-09-05 DIAGNOSIS — R140 Abdominal distension (gaseous): Secondary | ICD-10-CM | POA: Diagnosis not present

## 2019-09-05 DIAGNOSIS — I10 Essential (primary) hypertension: Secondary | ICD-10-CM | POA: Diagnosis not present

## 2019-09-05 DIAGNOSIS — Z743 Need for continuous supervision: Secondary | ICD-10-CM | POA: Diagnosis not present

## 2019-09-05 DIAGNOSIS — G8929 Other chronic pain: Secondary | ICD-10-CM | POA: Diagnosis not present

## 2019-09-05 DIAGNOSIS — R2243 Localized swelling, mass and lump, lower limb, bilateral: Secondary | ICD-10-CM | POA: Diagnosis not present

## 2019-09-05 DIAGNOSIS — E871 Hypo-osmolality and hyponatremia: Secondary | ICD-10-CM | POA: Diagnosis not present

## 2019-09-05 DIAGNOSIS — I878 Other specified disorders of veins: Secondary | ICD-10-CM | POA: Diagnosis not present

## 2019-09-05 DIAGNOSIS — Z881 Allergy status to other antibiotic agents status: Secondary | ICD-10-CM | POA: Diagnosis not present

## 2019-09-05 DIAGNOSIS — Z79899 Other long term (current) drug therapy: Secondary | ICD-10-CM | POA: Diagnosis not present

## 2019-09-05 DIAGNOSIS — I739 Peripheral vascular disease, unspecified: Secondary | ICD-10-CM | POA: Diagnosis not present

## 2019-09-05 DIAGNOSIS — G894 Chronic pain syndrome: Secondary | ICD-10-CM | POA: Diagnosis not present

## 2019-09-05 DIAGNOSIS — M1991 Primary osteoarthritis, unspecified site: Secondary | ICD-10-CM | POA: Diagnosis not present

## 2019-09-05 DIAGNOSIS — Z79891 Long term (current) use of opiate analgesic: Secondary | ICD-10-CM | POA: Diagnosis not present

## 2019-09-05 DIAGNOSIS — M6281 Muscle weakness (generalized): Secondary | ICD-10-CM | POA: Diagnosis not present

## 2019-09-05 DIAGNOSIS — I619 Nontraumatic intracerebral hemorrhage, unspecified: Secondary | ICD-10-CM | POA: Diagnosis not present

## 2019-09-05 DIAGNOSIS — Z7984 Long term (current) use of oral hypoglycemic drugs: Secondary | ICD-10-CM | POA: Diagnosis not present

## 2019-09-05 DIAGNOSIS — E1140 Type 2 diabetes mellitus with diabetic neuropathy, unspecified: Secondary | ICD-10-CM | POA: Diagnosis not present

## 2019-09-05 DIAGNOSIS — E1142 Type 2 diabetes mellitus with diabetic polyneuropathy: Secondary | ICD-10-CM | POA: Diagnosis not present

## 2019-09-05 DIAGNOSIS — M545 Low back pain: Secondary | ICD-10-CM | POA: Diagnosis not present

## 2019-09-05 DIAGNOSIS — N3000 Acute cystitis without hematuria: Secondary | ICD-10-CM | POA: Diagnosis not present

## 2019-09-05 DIAGNOSIS — K219 Gastro-esophageal reflux disease without esophagitis: Secondary | ICD-10-CM | POA: Diagnosis not present

## 2019-09-05 DIAGNOSIS — I610 Nontraumatic intracerebral hemorrhage in hemisphere, subcortical: Secondary | ICD-10-CM | POA: Diagnosis not present

## 2019-09-05 DIAGNOSIS — Z888 Allergy status to other drugs, medicaments and biological substances status: Secondary | ICD-10-CM | POA: Diagnosis not present

## 2019-09-05 DIAGNOSIS — R29898 Other symptoms and signs involving the musculoskeletal system: Secondary | ICD-10-CM | POA: Diagnosis not present

## 2019-09-05 DIAGNOSIS — I639 Cerebral infarction, unspecified: Secondary | ICD-10-CM | POA: Diagnosis not present

## 2019-09-05 DIAGNOSIS — Z8673 Personal history of transient ischemic attack (TIA), and cerebral infarction without residual deficits: Secondary | ICD-10-CM | POA: Diagnosis not present

## 2019-09-05 DIAGNOSIS — R0602 Shortness of breath: Secondary | ICD-10-CM | POA: Diagnosis not present

## 2019-09-05 DIAGNOSIS — R2689 Other abnormalities of gait and mobility: Secondary | ICD-10-CM | POA: Diagnosis not present

## 2019-09-05 DIAGNOSIS — M353 Polymyalgia rheumatica: Secondary | ICD-10-CM | POA: Diagnosis not present

## 2019-09-05 DIAGNOSIS — I618 Other nontraumatic intracerebral hemorrhage: Secondary | ICD-10-CM | POA: Diagnosis not present

## 2019-09-05 DIAGNOSIS — I615 Nontraumatic intracerebral hemorrhage, intraventricular: Secondary | ICD-10-CM | POA: Diagnosis not present

## 2019-09-05 DIAGNOSIS — M79605 Pain in left leg: Secondary | ICD-10-CM | POA: Diagnosis not present

## 2019-09-05 DIAGNOSIS — D649 Anemia, unspecified: Secondary | ICD-10-CM | POA: Diagnosis not present

## 2019-09-05 DIAGNOSIS — B961 Klebsiella pneumoniae [K. pneumoniae] as the cause of diseases classified elsewhere: Secondary | ICD-10-CM | POA: Diagnosis not present

## 2019-09-05 DIAGNOSIS — E119 Type 2 diabetes mellitus without complications: Secondary | ICD-10-CM | POA: Diagnosis not present

## 2019-09-05 DIAGNOSIS — E876 Hypokalemia: Secondary | ICD-10-CM | POA: Diagnosis not present

## 2019-09-05 DIAGNOSIS — K5900 Constipation, unspecified: Secondary | ICD-10-CM | POA: Diagnosis not present

## 2019-09-05 DIAGNOSIS — R269 Unspecified abnormalities of gait and mobility: Secondary | ICD-10-CM | POA: Diagnosis not present

## 2019-09-05 DIAGNOSIS — Z882 Allergy status to sulfonamides status: Secondary | ICD-10-CM | POA: Diagnosis not present

## 2019-09-05 DIAGNOSIS — E785 Hyperlipidemia, unspecified: Secondary | ICD-10-CM | POA: Diagnosis not present

## 2019-09-05 DIAGNOSIS — R509 Fever, unspecified: Secondary | ICD-10-CM | POA: Diagnosis not present

## 2019-09-05 DIAGNOSIS — I517 Cardiomegaly: Secondary | ICD-10-CM | POA: Diagnosis not present

## 2019-09-05 DIAGNOSIS — M1990 Unspecified osteoarthritis, unspecified site: Secondary | ICD-10-CM | POA: Diagnosis not present

## 2019-09-05 DIAGNOSIS — G8194 Hemiplegia, unspecified affecting left nondominant side: Secondary | ICD-10-CM | POA: Diagnosis not present

## 2019-09-05 DIAGNOSIS — Z20822 Contact with and (suspected) exposure to covid-19: Secondary | ICD-10-CM | POA: Diagnosis not present

## 2019-09-05 DIAGNOSIS — I161 Hypertensive emergency: Secondary | ICD-10-CM | POA: Diagnosis not present

## 2019-09-14 DIAGNOSIS — M6281 Muscle weakness (generalized): Secondary | ICD-10-CM | POA: Diagnosis not present

## 2019-09-14 DIAGNOSIS — R778 Other specified abnormalities of plasma proteins: Secondary | ICD-10-CM | POA: Diagnosis not present

## 2019-09-14 DIAGNOSIS — M479 Spondylosis, unspecified: Secondary | ICD-10-CM | POA: Diagnosis not present

## 2019-09-14 DIAGNOSIS — Z96651 Presence of right artificial knee joint: Secondary | ICD-10-CM | POA: Diagnosis not present

## 2019-09-14 DIAGNOSIS — E1122 Type 2 diabetes mellitus with diabetic chronic kidney disease: Secondary | ICD-10-CM | POA: Diagnosis not present

## 2019-09-14 DIAGNOSIS — I619 Nontraumatic intracerebral hemorrhage, unspecified: Secondary | ICD-10-CM | POA: Diagnosis not present

## 2019-09-14 DIAGNOSIS — I959 Hypotension, unspecified: Secondary | ICD-10-CM | POA: Diagnosis not present

## 2019-09-14 DIAGNOSIS — R404 Transient alteration of awareness: Secondary | ICD-10-CM | POA: Diagnosis not present

## 2019-09-14 DIAGNOSIS — I129 Hypertensive chronic kidney disease with stage 1 through stage 4 chronic kidney disease, or unspecified chronic kidney disease: Secondary | ICD-10-CM | POA: Diagnosis not present

## 2019-09-14 DIAGNOSIS — Z7902 Long term (current) use of antithrombotics/antiplatelets: Secondary | ICD-10-CM | POA: Diagnosis not present

## 2019-09-14 DIAGNOSIS — J449 Chronic obstructive pulmonary disease, unspecified: Secondary | ICD-10-CM | POA: Diagnosis not present

## 2019-09-14 DIAGNOSIS — M170 Bilateral primary osteoarthritis of knee: Secondary | ICD-10-CM | POA: Diagnosis not present

## 2019-09-14 DIAGNOSIS — I629 Nontraumatic intracranial hemorrhage, unspecified: Secondary | ICD-10-CM | POA: Diagnosis not present

## 2019-09-14 DIAGNOSIS — G894 Chronic pain syndrome: Secondary | ICD-10-CM | POA: Diagnosis not present

## 2019-09-14 DIAGNOSIS — Z20822 Contact with and (suspected) exposure to covid-19: Secondary | ICD-10-CM | POA: Diagnosis not present

## 2019-09-14 DIAGNOSIS — Z87891 Personal history of nicotine dependence: Secondary | ICD-10-CM | POA: Diagnosis not present

## 2019-09-14 DIAGNOSIS — E785 Hyperlipidemia, unspecified: Secondary | ICD-10-CM | POA: Diagnosis not present

## 2019-09-14 DIAGNOSIS — E119 Type 2 diabetes mellitus without complications: Secondary | ICD-10-CM | POA: Diagnosis not present

## 2019-09-14 DIAGNOSIS — E860 Dehydration: Secondary | ICD-10-CM | POA: Diagnosis not present

## 2019-09-14 DIAGNOSIS — R0689 Other abnormalities of breathing: Secondary | ICD-10-CM | POA: Diagnosis not present

## 2019-09-14 DIAGNOSIS — Z7984 Long term (current) use of oral hypoglycemic drugs: Secondary | ICD-10-CM | POA: Diagnosis not present

## 2019-09-14 DIAGNOSIS — Z743 Need for continuous supervision: Secondary | ICD-10-CM | POA: Diagnosis not present

## 2019-09-14 DIAGNOSIS — Z981 Arthrodesis status: Secondary | ICD-10-CM | POA: Diagnosis not present

## 2019-09-14 DIAGNOSIS — N189 Chronic kidney disease, unspecified: Secondary | ICD-10-CM | POA: Diagnosis not present

## 2019-09-14 DIAGNOSIS — R55 Syncope and collapse: Secondary | ICD-10-CM | POA: Diagnosis not present

## 2019-09-14 DIAGNOSIS — N3000 Acute cystitis without hematuria: Secondary | ICD-10-CM | POA: Diagnosis not present

## 2019-09-14 DIAGNOSIS — R29898 Other symptoms and signs involving the musculoskeletal system: Secondary | ICD-10-CM | POA: Diagnosis not present

## 2019-09-14 DIAGNOSIS — E871 Hypo-osmolality and hyponatremia: Secondary | ICD-10-CM | POA: Diagnosis not present

## 2019-09-14 DIAGNOSIS — R2689 Other abnormalities of gait and mobility: Secondary | ICD-10-CM | POA: Diagnosis not present

## 2019-09-14 DIAGNOSIS — G8194 Hemiplegia, unspecified affecting left nondominant side: Secondary | ICD-10-CM | POA: Diagnosis not present

## 2019-09-14 DIAGNOSIS — E876 Hypokalemia: Secondary | ICD-10-CM | POA: Diagnosis not present

## 2019-09-14 DIAGNOSIS — I161 Hypertensive emergency: Secondary | ICD-10-CM | POA: Diagnosis not present

## 2019-09-14 DIAGNOSIS — N179 Acute kidney failure, unspecified: Secondary | ICD-10-CM | POA: Diagnosis not present

## 2019-09-14 DIAGNOSIS — E1142 Type 2 diabetes mellitus with diabetic polyneuropathy: Secondary | ICD-10-CM | POA: Diagnosis not present

## 2019-09-15 DIAGNOSIS — E876 Hypokalemia: Secondary | ICD-10-CM | POA: Diagnosis not present

## 2019-09-15 DIAGNOSIS — I629 Nontraumatic intracranial hemorrhage, unspecified: Secondary | ICD-10-CM | POA: Diagnosis not present

## 2019-09-15 DIAGNOSIS — N3000 Acute cystitis without hematuria: Secondary | ICD-10-CM | POA: Diagnosis not present

## 2019-09-15 DIAGNOSIS — E119 Type 2 diabetes mellitus without complications: Secondary | ICD-10-CM | POA: Diagnosis not present

## 2019-09-15 DIAGNOSIS — E871 Hypo-osmolality and hyponatremia: Secondary | ICD-10-CM | POA: Diagnosis not present

## 2019-09-16 DIAGNOSIS — I129 Hypertensive chronic kidney disease with stage 1 through stage 4 chronic kidney disease, or unspecified chronic kidney disease: Secondary | ICD-10-CM | POA: Diagnosis not present

## 2019-09-16 DIAGNOSIS — Z20822 Contact with and (suspected) exposure to covid-19: Secondary | ICD-10-CM | POA: Diagnosis not present

## 2019-09-16 DIAGNOSIS — Z7902 Long term (current) use of antithrombotics/antiplatelets: Secondary | ICD-10-CM | POA: Diagnosis not present

## 2019-09-16 DIAGNOSIS — R69 Illness, unspecified: Secondary | ICD-10-CM | POA: Diagnosis not present

## 2019-09-16 DIAGNOSIS — E1122 Type 2 diabetes mellitus with diabetic chronic kidney disease: Secondary | ICD-10-CM | POA: Diagnosis not present

## 2019-09-16 DIAGNOSIS — I959 Hypotension, unspecified: Secondary | ICD-10-CM | POA: Diagnosis not present

## 2019-09-16 DIAGNOSIS — R55 Syncope and collapse: Secondary | ICD-10-CM | POA: Diagnosis not present

## 2019-09-16 DIAGNOSIS — Z7984 Long term (current) use of oral hypoglycemic drugs: Secondary | ICD-10-CM | POA: Diagnosis not present

## 2019-09-16 DIAGNOSIS — R2689 Other abnormalities of gait and mobility: Secondary | ICD-10-CM | POA: Diagnosis not present

## 2019-09-16 DIAGNOSIS — I629 Nontraumatic intracranial hemorrhage, unspecified: Secondary | ICD-10-CM | POA: Diagnosis not present

## 2019-09-16 DIAGNOSIS — M170 Bilateral primary osteoarthritis of knee: Secondary | ICD-10-CM | POA: Diagnosis not present

## 2019-09-16 DIAGNOSIS — R0689 Other abnormalities of breathing: Secondary | ICD-10-CM | POA: Diagnosis not present

## 2019-09-16 DIAGNOSIS — Z743 Need for continuous supervision: Secondary | ICD-10-CM | POA: Diagnosis not present

## 2019-09-16 DIAGNOSIS — M479 Spondylosis, unspecified: Secondary | ICD-10-CM | POA: Diagnosis not present

## 2019-09-16 DIAGNOSIS — G894 Chronic pain syndrome: Secondary | ICD-10-CM | POA: Diagnosis not present

## 2019-09-16 DIAGNOSIS — N179 Acute kidney failure, unspecified: Secondary | ICD-10-CM | POA: Diagnosis not present

## 2019-09-16 DIAGNOSIS — I10 Essential (primary) hypertension: Secondary | ICD-10-CM | POA: Diagnosis not present

## 2019-09-16 DIAGNOSIS — G8194 Hemiplegia, unspecified affecting left nondominant side: Secondary | ICD-10-CM | POA: Diagnosis not present

## 2019-09-16 DIAGNOSIS — N189 Chronic kidney disease, unspecified: Secondary | ICD-10-CM | POA: Diagnosis not present

## 2019-09-16 DIAGNOSIS — E1142 Type 2 diabetes mellitus with diabetic polyneuropathy: Secondary | ICD-10-CM | POA: Diagnosis not present

## 2019-09-16 DIAGNOSIS — Z87891 Personal history of nicotine dependence: Secondary | ICD-10-CM | POA: Diagnosis not present

## 2019-09-16 DIAGNOSIS — E860 Dehydration: Secondary | ICD-10-CM | POA: Diagnosis not present

## 2019-09-16 DIAGNOSIS — M6281 Muscle weakness (generalized): Secondary | ICD-10-CM | POA: Diagnosis not present

## 2019-09-16 DIAGNOSIS — Z96651 Presence of right artificial knee joint: Secondary | ICD-10-CM | POA: Diagnosis not present

## 2019-09-16 DIAGNOSIS — N3000 Acute cystitis without hematuria: Secondary | ICD-10-CM | POA: Diagnosis not present

## 2019-09-16 DIAGNOSIS — E785 Hyperlipidemia, unspecified: Secondary | ICD-10-CM | POA: Diagnosis not present

## 2019-09-16 DIAGNOSIS — J449 Chronic obstructive pulmonary disease, unspecified: Secondary | ICD-10-CM | POA: Diagnosis not present

## 2019-09-16 DIAGNOSIS — Z7401 Bed confinement status: Secondary | ICD-10-CM | POA: Diagnosis not present

## 2019-09-16 DIAGNOSIS — Z981 Arthrodesis status: Secondary | ICD-10-CM | POA: Diagnosis not present

## 2019-09-16 DIAGNOSIS — R404 Transient alteration of awareness: Secondary | ICD-10-CM | POA: Diagnosis not present

## 2019-09-16 DIAGNOSIS — R778 Other specified abnormalities of plasma proteins: Secondary | ICD-10-CM | POA: Diagnosis not present

## 2019-09-16 DIAGNOSIS — R5381 Other malaise: Secondary | ICD-10-CM | POA: Diagnosis not present

## 2019-09-16 DIAGNOSIS — I619 Nontraumatic intracerebral hemorrhage, unspecified: Secondary | ICD-10-CM | POA: Diagnosis not present

## 2019-09-20 DIAGNOSIS — E1122 Type 2 diabetes mellitus with diabetic chronic kidney disease: Secondary | ICD-10-CM | POA: Diagnosis not present

## 2019-09-20 DIAGNOSIS — N3000 Acute cystitis without hematuria: Secondary | ICD-10-CM | POA: Diagnosis not present

## 2019-09-20 DIAGNOSIS — M6281 Muscle weakness (generalized): Secondary | ICD-10-CM | POA: Diagnosis not present

## 2019-09-20 DIAGNOSIS — Z743 Need for continuous supervision: Secondary | ICD-10-CM | POA: Diagnosis not present

## 2019-09-20 DIAGNOSIS — R5381 Other malaise: Secondary | ICD-10-CM | POA: Diagnosis not present

## 2019-09-20 DIAGNOSIS — E1142 Type 2 diabetes mellitus with diabetic polyneuropathy: Secondary | ICD-10-CM | POA: Diagnosis not present

## 2019-09-20 DIAGNOSIS — R2689 Other abnormalities of gait and mobility: Secondary | ICD-10-CM | POA: Diagnosis not present

## 2019-09-20 DIAGNOSIS — R69 Illness, unspecified: Secondary | ICD-10-CM | POA: Diagnosis not present

## 2019-09-20 DIAGNOSIS — R55 Syncope and collapse: Secondary | ICD-10-CM | POA: Diagnosis not present

## 2019-09-20 DIAGNOSIS — G8194 Hemiplegia, unspecified affecting left nondominant side: Secondary | ICD-10-CM | POA: Diagnosis not present

## 2019-09-20 DIAGNOSIS — G894 Chronic pain syndrome: Secondary | ICD-10-CM | POA: Diagnosis not present

## 2019-09-20 DIAGNOSIS — I10 Essential (primary) hypertension: Secondary | ICD-10-CM | POA: Diagnosis not present

## 2019-09-20 DIAGNOSIS — N189 Chronic kidney disease, unspecified: Secondary | ICD-10-CM | POA: Diagnosis not present

## 2019-09-20 DIAGNOSIS — Z7401 Bed confinement status: Secondary | ICD-10-CM | POA: Diagnosis not present

## 2019-09-20 DIAGNOSIS — I629 Nontraumatic intracranial hemorrhage, unspecified: Secondary | ICD-10-CM | POA: Diagnosis not present

## 2019-09-20 DIAGNOSIS — J449 Chronic obstructive pulmonary disease, unspecified: Secondary | ICD-10-CM | POA: Diagnosis not present

## 2019-09-20 DIAGNOSIS — E860 Dehydration: Secondary | ICD-10-CM | POA: Diagnosis not present

## 2019-09-20 DIAGNOSIS — I619 Nontraumatic intracerebral hemorrhage, unspecified: Secondary | ICD-10-CM | POA: Diagnosis not present

## 2019-09-20 DIAGNOSIS — I129 Hypertensive chronic kidney disease with stage 1 through stage 4 chronic kidney disease, or unspecified chronic kidney disease: Secondary | ICD-10-CM | POA: Diagnosis not present

## 2019-10-05 DIAGNOSIS — N189 Chronic kidney disease, unspecified: Secondary | ICD-10-CM | POA: Diagnosis not present

## 2019-10-05 DIAGNOSIS — R55 Syncope and collapse: Secondary | ICD-10-CM | POA: Diagnosis not present

## 2019-10-05 DIAGNOSIS — I629 Nontraumatic intracranial hemorrhage, unspecified: Secondary | ICD-10-CM | POA: Diagnosis not present

## 2019-10-05 DIAGNOSIS — E860 Dehydration: Secondary | ICD-10-CM | POA: Diagnosis not present

## 2019-10-13 DIAGNOSIS — E785 Hyperlipidemia, unspecified: Secondary | ICD-10-CM | POA: Diagnosis not present

## 2019-10-13 DIAGNOSIS — E1122 Type 2 diabetes mellitus with diabetic chronic kidney disease: Secondary | ICD-10-CM | POA: Diagnosis not present

## 2019-10-13 DIAGNOSIS — M479 Spondylosis, unspecified: Secondary | ICD-10-CM | POA: Diagnosis not present

## 2019-10-13 DIAGNOSIS — Z87891 Personal history of nicotine dependence: Secondary | ICD-10-CM | POA: Diagnosis not present

## 2019-10-13 DIAGNOSIS — J449 Chronic obstructive pulmonary disease, unspecified: Secondary | ICD-10-CM | POA: Diagnosis not present

## 2019-10-13 DIAGNOSIS — E1151 Type 2 diabetes mellitus with diabetic peripheral angiopathy without gangrene: Secondary | ICD-10-CM | POA: Diagnosis not present

## 2019-10-13 DIAGNOSIS — Z7984 Long term (current) use of oral hypoglycemic drugs: Secondary | ICD-10-CM | POA: Diagnosis not present

## 2019-10-13 DIAGNOSIS — Z79891 Long term (current) use of opiate analgesic: Secondary | ICD-10-CM | POA: Diagnosis not present

## 2019-10-13 DIAGNOSIS — R42 Dizziness and giddiness: Secondary | ICD-10-CM | POA: Diagnosis not present

## 2019-10-13 DIAGNOSIS — Z87440 Personal history of urinary (tract) infections: Secondary | ICD-10-CM | POA: Diagnosis not present

## 2019-10-13 DIAGNOSIS — I129 Hypertensive chronic kidney disease with stage 1 through stage 4 chronic kidney disease, or unspecified chronic kidney disease: Secondary | ICD-10-CM | POA: Diagnosis not present

## 2019-10-13 DIAGNOSIS — M1712 Unilateral primary osteoarthritis, left knee: Secondary | ICD-10-CM | POA: Diagnosis not present

## 2019-10-13 DIAGNOSIS — G8929 Other chronic pain: Secondary | ICD-10-CM | POA: Diagnosis not present

## 2019-10-13 DIAGNOSIS — Z7902 Long term (current) use of antithrombotics/antiplatelets: Secondary | ICD-10-CM | POA: Diagnosis not present

## 2019-10-13 DIAGNOSIS — Z981 Arthrodesis status: Secondary | ICD-10-CM | POA: Diagnosis not present

## 2019-10-13 DIAGNOSIS — Z96651 Presence of right artificial knee joint: Secondary | ICD-10-CM | POA: Diagnosis not present

## 2019-10-13 DIAGNOSIS — N189 Chronic kidney disease, unspecified: Secondary | ICD-10-CM | POA: Diagnosis not present

## 2019-10-13 DIAGNOSIS — I69254 Hemiplegia and hemiparesis following other nontraumatic intracranial hemorrhage affecting left non-dominant side: Secondary | ICD-10-CM | POA: Diagnosis not present

## 2019-10-15 DIAGNOSIS — I129 Hypertensive chronic kidney disease with stage 1 through stage 4 chronic kidney disease, or unspecified chronic kidney disease: Secondary | ICD-10-CM | POA: Diagnosis not present

## 2019-10-15 DIAGNOSIS — Z981 Arthrodesis status: Secondary | ICD-10-CM | POA: Diagnosis not present

## 2019-10-15 DIAGNOSIS — Z87891 Personal history of nicotine dependence: Secondary | ICD-10-CM | POA: Diagnosis not present

## 2019-10-15 DIAGNOSIS — M1712 Unilateral primary osteoarthritis, left knee: Secondary | ICD-10-CM | POA: Diagnosis not present

## 2019-10-15 DIAGNOSIS — R42 Dizziness and giddiness: Secondary | ICD-10-CM | POA: Diagnosis not present

## 2019-10-15 DIAGNOSIS — E785 Hyperlipidemia, unspecified: Secondary | ICD-10-CM | POA: Diagnosis not present

## 2019-10-15 DIAGNOSIS — E1122 Type 2 diabetes mellitus with diabetic chronic kidney disease: Secondary | ICD-10-CM | POA: Diagnosis not present

## 2019-10-15 DIAGNOSIS — Z96651 Presence of right artificial knee joint: Secondary | ICD-10-CM | POA: Diagnosis not present

## 2019-10-15 DIAGNOSIS — G8929 Other chronic pain: Secondary | ICD-10-CM | POA: Diagnosis not present

## 2019-10-15 DIAGNOSIS — Z7902 Long term (current) use of antithrombotics/antiplatelets: Secondary | ICD-10-CM | POA: Diagnosis not present

## 2019-10-15 DIAGNOSIS — E1151 Type 2 diabetes mellitus with diabetic peripheral angiopathy without gangrene: Secondary | ICD-10-CM | POA: Diagnosis not present

## 2019-10-15 DIAGNOSIS — M479 Spondylosis, unspecified: Secondary | ICD-10-CM | POA: Diagnosis not present

## 2019-10-15 DIAGNOSIS — Z79891 Long term (current) use of opiate analgesic: Secondary | ICD-10-CM | POA: Diagnosis not present

## 2019-10-15 DIAGNOSIS — Z87440 Personal history of urinary (tract) infections: Secondary | ICD-10-CM | POA: Diagnosis not present

## 2019-10-15 DIAGNOSIS — Z7984 Long term (current) use of oral hypoglycemic drugs: Secondary | ICD-10-CM | POA: Diagnosis not present

## 2019-10-15 DIAGNOSIS — J449 Chronic obstructive pulmonary disease, unspecified: Secondary | ICD-10-CM | POA: Diagnosis not present

## 2019-10-15 DIAGNOSIS — N189 Chronic kidney disease, unspecified: Secondary | ICD-10-CM | POA: Diagnosis not present

## 2019-10-15 DIAGNOSIS — I69254 Hemiplegia and hemiparesis following other nontraumatic intracranial hemorrhage affecting left non-dominant side: Secondary | ICD-10-CM | POA: Diagnosis not present

## 2019-10-19 DIAGNOSIS — G622 Polyneuropathy due to other toxic agents: Secondary | ICD-10-CM | POA: Diagnosis not present

## 2019-10-19 DIAGNOSIS — E7849 Other hyperlipidemia: Secondary | ICD-10-CM | POA: Diagnosis not present

## 2019-10-19 DIAGNOSIS — I10 Essential (primary) hypertension: Secondary | ICD-10-CM | POA: Diagnosis not present

## 2019-10-19 DIAGNOSIS — Z8673 Personal history of transient ischemic attack (TIA), and cerebral infarction without residual deficits: Secondary | ICD-10-CM | POA: Diagnosis not present

## 2019-10-19 DIAGNOSIS — Z0000 Encounter for general adult medical examination without abnormal findings: Secondary | ICD-10-CM | POA: Diagnosis not present

## 2019-10-19 DIAGNOSIS — E119 Type 2 diabetes mellitus without complications: Secondary | ICD-10-CM | POA: Diagnosis not present

## 2019-10-20 DIAGNOSIS — G8929 Other chronic pain: Secondary | ICD-10-CM | POA: Diagnosis not present

## 2019-10-20 DIAGNOSIS — Z7984 Long term (current) use of oral hypoglycemic drugs: Secondary | ICD-10-CM | POA: Diagnosis not present

## 2019-10-20 DIAGNOSIS — E1122 Type 2 diabetes mellitus with diabetic chronic kidney disease: Secondary | ICD-10-CM | POA: Diagnosis not present

## 2019-10-20 DIAGNOSIS — Z981 Arthrodesis status: Secondary | ICD-10-CM | POA: Diagnosis not present

## 2019-10-20 DIAGNOSIS — I69254 Hemiplegia and hemiparesis following other nontraumatic intracranial hemorrhage affecting left non-dominant side: Secondary | ICD-10-CM | POA: Diagnosis not present

## 2019-10-20 DIAGNOSIS — R42 Dizziness and giddiness: Secondary | ICD-10-CM | POA: Diagnosis not present

## 2019-10-20 DIAGNOSIS — Z87440 Personal history of urinary (tract) infections: Secondary | ICD-10-CM | POA: Diagnosis not present

## 2019-10-20 DIAGNOSIS — Z7902 Long term (current) use of antithrombotics/antiplatelets: Secondary | ICD-10-CM | POA: Diagnosis not present

## 2019-10-20 DIAGNOSIS — E1151 Type 2 diabetes mellitus with diabetic peripheral angiopathy without gangrene: Secondary | ICD-10-CM | POA: Diagnosis not present

## 2019-10-20 DIAGNOSIS — M479 Spondylosis, unspecified: Secondary | ICD-10-CM | POA: Diagnosis not present

## 2019-10-20 DIAGNOSIS — Z87891 Personal history of nicotine dependence: Secondary | ICD-10-CM | POA: Diagnosis not present

## 2019-10-20 DIAGNOSIS — Z96651 Presence of right artificial knee joint: Secondary | ICD-10-CM | POA: Diagnosis not present

## 2019-10-20 DIAGNOSIS — Z79891 Long term (current) use of opiate analgesic: Secondary | ICD-10-CM | POA: Diagnosis not present

## 2019-10-20 DIAGNOSIS — I129 Hypertensive chronic kidney disease with stage 1 through stage 4 chronic kidney disease, or unspecified chronic kidney disease: Secondary | ICD-10-CM | POA: Diagnosis not present

## 2019-10-20 DIAGNOSIS — N189 Chronic kidney disease, unspecified: Secondary | ICD-10-CM | POA: Diagnosis not present

## 2019-10-20 DIAGNOSIS — M1712 Unilateral primary osteoarthritis, left knee: Secondary | ICD-10-CM | POA: Diagnosis not present

## 2019-10-20 DIAGNOSIS — J449 Chronic obstructive pulmonary disease, unspecified: Secondary | ICD-10-CM | POA: Diagnosis not present

## 2019-10-20 DIAGNOSIS — E785 Hyperlipidemia, unspecified: Secondary | ICD-10-CM | POA: Diagnosis not present

## 2019-10-22 DIAGNOSIS — N189 Chronic kidney disease, unspecified: Secondary | ICD-10-CM | POA: Diagnosis not present

## 2019-10-22 DIAGNOSIS — Z981 Arthrodesis status: Secondary | ICD-10-CM | POA: Diagnosis not present

## 2019-10-22 DIAGNOSIS — Z96651 Presence of right artificial knee joint: Secondary | ICD-10-CM | POA: Diagnosis not present

## 2019-10-22 DIAGNOSIS — I69254 Hemiplegia and hemiparesis following other nontraumatic intracranial hemorrhage affecting left non-dominant side: Secondary | ICD-10-CM | POA: Diagnosis not present

## 2019-10-22 DIAGNOSIS — Z87891 Personal history of nicotine dependence: Secondary | ICD-10-CM | POA: Diagnosis not present

## 2019-10-22 DIAGNOSIS — E1151 Type 2 diabetes mellitus with diabetic peripheral angiopathy without gangrene: Secondary | ICD-10-CM | POA: Diagnosis not present

## 2019-10-22 DIAGNOSIS — M1712 Unilateral primary osteoarthritis, left knee: Secondary | ICD-10-CM | POA: Diagnosis not present

## 2019-10-22 DIAGNOSIS — I129 Hypertensive chronic kidney disease with stage 1 through stage 4 chronic kidney disease, or unspecified chronic kidney disease: Secondary | ICD-10-CM | POA: Diagnosis not present

## 2019-10-22 DIAGNOSIS — E1122 Type 2 diabetes mellitus with diabetic chronic kidney disease: Secondary | ICD-10-CM | POA: Diagnosis not present

## 2019-10-22 DIAGNOSIS — Z7984 Long term (current) use of oral hypoglycemic drugs: Secondary | ICD-10-CM | POA: Diagnosis not present

## 2019-10-22 DIAGNOSIS — G8929 Other chronic pain: Secondary | ICD-10-CM | POA: Diagnosis not present

## 2019-10-22 DIAGNOSIS — Z79891 Long term (current) use of opiate analgesic: Secondary | ICD-10-CM | POA: Diagnosis not present

## 2019-10-22 DIAGNOSIS — J449 Chronic obstructive pulmonary disease, unspecified: Secondary | ICD-10-CM | POA: Diagnosis not present

## 2019-10-22 DIAGNOSIS — Z87440 Personal history of urinary (tract) infections: Secondary | ICD-10-CM | POA: Diagnosis not present

## 2019-10-22 DIAGNOSIS — E785 Hyperlipidemia, unspecified: Secondary | ICD-10-CM | POA: Diagnosis not present

## 2019-10-22 DIAGNOSIS — R42 Dizziness and giddiness: Secondary | ICD-10-CM | POA: Diagnosis not present

## 2019-10-22 DIAGNOSIS — M479 Spondylosis, unspecified: Secondary | ICD-10-CM | POA: Diagnosis not present

## 2019-10-22 DIAGNOSIS — Z7902 Long term (current) use of antithrombotics/antiplatelets: Secondary | ICD-10-CM | POA: Diagnosis not present

## 2019-10-27 DIAGNOSIS — E1151 Type 2 diabetes mellitus with diabetic peripheral angiopathy without gangrene: Secondary | ICD-10-CM | POA: Diagnosis not present

## 2019-10-27 DIAGNOSIS — N189 Chronic kidney disease, unspecified: Secondary | ICD-10-CM | POA: Diagnosis not present

## 2019-10-27 DIAGNOSIS — M479 Spondylosis, unspecified: Secondary | ICD-10-CM | POA: Diagnosis not present

## 2019-10-27 DIAGNOSIS — E1122 Type 2 diabetes mellitus with diabetic chronic kidney disease: Secondary | ICD-10-CM | POA: Diagnosis not present

## 2019-10-27 DIAGNOSIS — Z7984 Long term (current) use of oral hypoglycemic drugs: Secondary | ICD-10-CM | POA: Diagnosis not present

## 2019-10-27 DIAGNOSIS — Z87440 Personal history of urinary (tract) infections: Secondary | ICD-10-CM | POA: Diagnosis not present

## 2019-10-27 DIAGNOSIS — Z981 Arthrodesis status: Secondary | ICD-10-CM | POA: Diagnosis not present

## 2019-10-27 DIAGNOSIS — M1712 Unilateral primary osteoarthritis, left knee: Secondary | ICD-10-CM | POA: Diagnosis not present

## 2019-10-27 DIAGNOSIS — Z87891 Personal history of nicotine dependence: Secondary | ICD-10-CM | POA: Diagnosis not present

## 2019-10-27 DIAGNOSIS — I69254 Hemiplegia and hemiparesis following other nontraumatic intracranial hemorrhage affecting left non-dominant side: Secondary | ICD-10-CM | POA: Diagnosis not present

## 2019-10-27 DIAGNOSIS — I129 Hypertensive chronic kidney disease with stage 1 through stage 4 chronic kidney disease, or unspecified chronic kidney disease: Secondary | ICD-10-CM | POA: Diagnosis not present

## 2019-10-27 DIAGNOSIS — Z79891 Long term (current) use of opiate analgesic: Secondary | ICD-10-CM | POA: Diagnosis not present

## 2019-10-27 DIAGNOSIS — R42 Dizziness and giddiness: Secondary | ICD-10-CM | POA: Diagnosis not present

## 2019-10-27 DIAGNOSIS — Z7902 Long term (current) use of antithrombotics/antiplatelets: Secondary | ICD-10-CM | POA: Diagnosis not present

## 2019-10-27 DIAGNOSIS — J449 Chronic obstructive pulmonary disease, unspecified: Secondary | ICD-10-CM | POA: Diagnosis not present

## 2019-10-27 DIAGNOSIS — E785 Hyperlipidemia, unspecified: Secondary | ICD-10-CM | POA: Diagnosis not present

## 2019-10-27 DIAGNOSIS — G8929 Other chronic pain: Secondary | ICD-10-CM | POA: Diagnosis not present

## 2019-10-27 DIAGNOSIS — Z96651 Presence of right artificial knee joint: Secondary | ICD-10-CM | POA: Diagnosis not present

## 2019-10-28 DIAGNOSIS — I129 Hypertensive chronic kidney disease with stage 1 through stage 4 chronic kidney disease, or unspecified chronic kidney disease: Secondary | ICD-10-CM | POA: Diagnosis not present

## 2019-10-28 DIAGNOSIS — M1712 Unilateral primary osteoarthritis, left knee: Secondary | ICD-10-CM | POA: Diagnosis not present

## 2019-10-28 DIAGNOSIS — I69254 Hemiplegia and hemiparesis following other nontraumatic intracranial hemorrhage affecting left non-dominant side: Secondary | ICD-10-CM | POA: Diagnosis not present

## 2019-10-28 DIAGNOSIS — Z96651 Presence of right artificial knee joint: Secondary | ICD-10-CM | POA: Diagnosis not present

## 2019-10-28 DIAGNOSIS — M479 Spondylosis, unspecified: Secondary | ICD-10-CM | POA: Diagnosis not present

## 2019-10-28 DIAGNOSIS — R42 Dizziness and giddiness: Secondary | ICD-10-CM | POA: Diagnosis not present

## 2019-10-28 DIAGNOSIS — N189 Chronic kidney disease, unspecified: Secondary | ICD-10-CM | POA: Diagnosis not present

## 2019-10-28 DIAGNOSIS — Z981 Arthrodesis status: Secondary | ICD-10-CM | POA: Diagnosis not present

## 2019-10-28 DIAGNOSIS — G8929 Other chronic pain: Secondary | ICD-10-CM | POA: Diagnosis not present

## 2019-10-28 DIAGNOSIS — E1122 Type 2 diabetes mellitus with diabetic chronic kidney disease: Secondary | ICD-10-CM | POA: Diagnosis not present

## 2019-10-28 DIAGNOSIS — J449 Chronic obstructive pulmonary disease, unspecified: Secondary | ICD-10-CM | POA: Diagnosis not present

## 2019-10-28 DIAGNOSIS — E785 Hyperlipidemia, unspecified: Secondary | ICD-10-CM | POA: Diagnosis not present

## 2019-10-28 DIAGNOSIS — Z7902 Long term (current) use of antithrombotics/antiplatelets: Secondary | ICD-10-CM | POA: Diagnosis not present

## 2019-10-28 DIAGNOSIS — Z79891 Long term (current) use of opiate analgesic: Secondary | ICD-10-CM | POA: Diagnosis not present

## 2019-10-28 DIAGNOSIS — Z87440 Personal history of urinary (tract) infections: Secondary | ICD-10-CM | POA: Diagnosis not present

## 2019-10-28 DIAGNOSIS — E1151 Type 2 diabetes mellitus with diabetic peripheral angiopathy without gangrene: Secondary | ICD-10-CM | POA: Diagnosis not present

## 2019-10-28 DIAGNOSIS — Z7984 Long term (current) use of oral hypoglycemic drugs: Secondary | ICD-10-CM | POA: Diagnosis not present

## 2019-10-28 DIAGNOSIS — Z87891 Personal history of nicotine dependence: Secondary | ICD-10-CM | POA: Diagnosis not present

## 2019-10-29 DIAGNOSIS — R42 Dizziness and giddiness: Secondary | ICD-10-CM | POA: Diagnosis not present

## 2019-10-29 DIAGNOSIS — Z7984 Long term (current) use of oral hypoglycemic drugs: Secondary | ICD-10-CM | POA: Diagnosis not present

## 2019-10-29 DIAGNOSIS — Z96651 Presence of right artificial knee joint: Secondary | ICD-10-CM | POA: Diagnosis not present

## 2019-10-29 DIAGNOSIS — E1151 Type 2 diabetes mellitus with diabetic peripheral angiopathy without gangrene: Secondary | ICD-10-CM | POA: Diagnosis not present

## 2019-10-29 DIAGNOSIS — Z79891 Long term (current) use of opiate analgesic: Secondary | ICD-10-CM | POA: Diagnosis not present

## 2019-10-29 DIAGNOSIS — J449 Chronic obstructive pulmonary disease, unspecified: Secondary | ICD-10-CM | POA: Diagnosis not present

## 2019-10-29 DIAGNOSIS — Z7902 Long term (current) use of antithrombotics/antiplatelets: Secondary | ICD-10-CM | POA: Diagnosis not present

## 2019-10-29 DIAGNOSIS — E1122 Type 2 diabetes mellitus with diabetic chronic kidney disease: Secondary | ICD-10-CM | POA: Diagnosis not present

## 2019-10-29 DIAGNOSIS — G8929 Other chronic pain: Secondary | ICD-10-CM | POA: Diagnosis not present

## 2019-10-29 DIAGNOSIS — I129 Hypertensive chronic kidney disease with stage 1 through stage 4 chronic kidney disease, or unspecified chronic kidney disease: Secondary | ICD-10-CM | POA: Diagnosis not present

## 2019-10-29 DIAGNOSIS — M1712 Unilateral primary osteoarthritis, left knee: Secondary | ICD-10-CM | POA: Diagnosis not present

## 2019-10-29 DIAGNOSIS — Z981 Arthrodesis status: Secondary | ICD-10-CM | POA: Diagnosis not present

## 2019-10-29 DIAGNOSIS — I69254 Hemiplegia and hemiparesis following other nontraumatic intracranial hemorrhage affecting left non-dominant side: Secondary | ICD-10-CM | POA: Diagnosis not present

## 2019-10-29 DIAGNOSIS — N189 Chronic kidney disease, unspecified: Secondary | ICD-10-CM | POA: Diagnosis not present

## 2019-10-29 DIAGNOSIS — Z87440 Personal history of urinary (tract) infections: Secondary | ICD-10-CM | POA: Diagnosis not present

## 2019-10-29 DIAGNOSIS — M479 Spondylosis, unspecified: Secondary | ICD-10-CM | POA: Diagnosis not present

## 2019-10-29 DIAGNOSIS — E785 Hyperlipidemia, unspecified: Secondary | ICD-10-CM | POA: Diagnosis not present

## 2019-10-29 DIAGNOSIS — Z87891 Personal history of nicotine dependence: Secondary | ICD-10-CM | POA: Diagnosis not present

## 2019-11-01 DIAGNOSIS — R42 Dizziness and giddiness: Secondary | ICD-10-CM | POA: Diagnosis not present

## 2019-11-01 DIAGNOSIS — M479 Spondylosis, unspecified: Secondary | ICD-10-CM | POA: Diagnosis not present

## 2019-11-01 DIAGNOSIS — E1151 Type 2 diabetes mellitus with diabetic peripheral angiopathy without gangrene: Secondary | ICD-10-CM | POA: Diagnosis not present

## 2019-11-01 DIAGNOSIS — Z7984 Long term (current) use of oral hypoglycemic drugs: Secondary | ICD-10-CM | POA: Diagnosis not present

## 2019-11-01 DIAGNOSIS — M1712 Unilateral primary osteoarthritis, left knee: Secondary | ICD-10-CM | POA: Diagnosis not present

## 2019-11-01 DIAGNOSIS — Z79891 Long term (current) use of opiate analgesic: Secondary | ICD-10-CM | POA: Diagnosis not present

## 2019-11-01 DIAGNOSIS — Z7902 Long term (current) use of antithrombotics/antiplatelets: Secondary | ICD-10-CM | POA: Diagnosis not present

## 2019-11-01 DIAGNOSIS — I69254 Hemiplegia and hemiparesis following other nontraumatic intracranial hemorrhage affecting left non-dominant side: Secondary | ICD-10-CM | POA: Diagnosis not present

## 2019-11-01 DIAGNOSIS — E785 Hyperlipidemia, unspecified: Secondary | ICD-10-CM | POA: Diagnosis not present

## 2019-11-01 DIAGNOSIS — Z981 Arthrodesis status: Secondary | ICD-10-CM | POA: Diagnosis not present

## 2019-11-01 DIAGNOSIS — N189 Chronic kidney disease, unspecified: Secondary | ICD-10-CM | POA: Diagnosis not present

## 2019-11-01 DIAGNOSIS — G8929 Other chronic pain: Secondary | ICD-10-CM | POA: Diagnosis not present

## 2019-11-01 DIAGNOSIS — Z96651 Presence of right artificial knee joint: Secondary | ICD-10-CM | POA: Diagnosis not present

## 2019-11-01 DIAGNOSIS — Z87891 Personal history of nicotine dependence: Secondary | ICD-10-CM | POA: Diagnosis not present

## 2019-11-01 DIAGNOSIS — J449 Chronic obstructive pulmonary disease, unspecified: Secondary | ICD-10-CM | POA: Diagnosis not present

## 2019-11-01 DIAGNOSIS — Z87440 Personal history of urinary (tract) infections: Secondary | ICD-10-CM | POA: Diagnosis not present

## 2019-11-01 DIAGNOSIS — I129 Hypertensive chronic kidney disease with stage 1 through stage 4 chronic kidney disease, or unspecified chronic kidney disease: Secondary | ICD-10-CM | POA: Diagnosis not present

## 2019-11-01 DIAGNOSIS — E1122 Type 2 diabetes mellitus with diabetic chronic kidney disease: Secondary | ICD-10-CM | POA: Diagnosis not present

## 2019-11-02 DIAGNOSIS — Z7984 Long term (current) use of oral hypoglycemic drugs: Secondary | ICD-10-CM | POA: Diagnosis not present

## 2019-11-02 DIAGNOSIS — E785 Hyperlipidemia, unspecified: Secondary | ICD-10-CM | POA: Diagnosis not present

## 2019-11-02 DIAGNOSIS — Z87891 Personal history of nicotine dependence: Secondary | ICD-10-CM | POA: Diagnosis not present

## 2019-11-02 DIAGNOSIS — G8929 Other chronic pain: Secondary | ICD-10-CM | POA: Diagnosis not present

## 2019-11-02 DIAGNOSIS — R42 Dizziness and giddiness: Secondary | ICD-10-CM | POA: Diagnosis not present

## 2019-11-02 DIAGNOSIS — I69254 Hemiplegia and hemiparesis following other nontraumatic intracranial hemorrhage affecting left non-dominant side: Secondary | ICD-10-CM | POA: Diagnosis not present

## 2019-11-02 DIAGNOSIS — J449 Chronic obstructive pulmonary disease, unspecified: Secondary | ICD-10-CM | POA: Diagnosis not present

## 2019-11-02 DIAGNOSIS — Z7902 Long term (current) use of antithrombotics/antiplatelets: Secondary | ICD-10-CM | POA: Diagnosis not present

## 2019-11-02 DIAGNOSIS — Z96651 Presence of right artificial knee joint: Secondary | ICD-10-CM | POA: Diagnosis not present

## 2019-11-02 DIAGNOSIS — N189 Chronic kidney disease, unspecified: Secondary | ICD-10-CM | POA: Diagnosis not present

## 2019-11-02 DIAGNOSIS — Z79891 Long term (current) use of opiate analgesic: Secondary | ICD-10-CM | POA: Diagnosis not present

## 2019-11-02 DIAGNOSIS — I129 Hypertensive chronic kidney disease with stage 1 through stage 4 chronic kidney disease, or unspecified chronic kidney disease: Secondary | ICD-10-CM | POA: Diagnosis not present

## 2019-11-02 DIAGNOSIS — Z981 Arthrodesis status: Secondary | ICD-10-CM | POA: Diagnosis not present

## 2019-11-02 DIAGNOSIS — Z87440 Personal history of urinary (tract) infections: Secondary | ICD-10-CM | POA: Diagnosis not present

## 2019-11-02 DIAGNOSIS — E1151 Type 2 diabetes mellitus with diabetic peripheral angiopathy without gangrene: Secondary | ICD-10-CM | POA: Diagnosis not present

## 2019-11-02 DIAGNOSIS — M1712 Unilateral primary osteoarthritis, left knee: Secondary | ICD-10-CM | POA: Diagnosis not present

## 2019-11-02 DIAGNOSIS — E1122 Type 2 diabetes mellitus with diabetic chronic kidney disease: Secondary | ICD-10-CM | POA: Diagnosis not present

## 2019-11-02 DIAGNOSIS — M479 Spondylosis, unspecified: Secondary | ICD-10-CM | POA: Diagnosis not present

## 2019-11-03 DIAGNOSIS — Z79891 Long term (current) use of opiate analgesic: Secondary | ICD-10-CM | POA: Diagnosis not present

## 2019-11-03 DIAGNOSIS — E785 Hyperlipidemia, unspecified: Secondary | ICD-10-CM | POA: Diagnosis not present

## 2019-11-03 DIAGNOSIS — Z7984 Long term (current) use of oral hypoglycemic drugs: Secondary | ICD-10-CM | POA: Diagnosis not present

## 2019-11-03 DIAGNOSIS — J449 Chronic obstructive pulmonary disease, unspecified: Secondary | ICD-10-CM | POA: Diagnosis not present

## 2019-11-03 DIAGNOSIS — Z981 Arthrodesis status: Secondary | ICD-10-CM | POA: Diagnosis not present

## 2019-11-03 DIAGNOSIS — E1151 Type 2 diabetes mellitus with diabetic peripheral angiopathy without gangrene: Secondary | ICD-10-CM | POA: Diagnosis not present

## 2019-11-03 DIAGNOSIS — N189 Chronic kidney disease, unspecified: Secondary | ICD-10-CM | POA: Diagnosis not present

## 2019-11-03 DIAGNOSIS — R42 Dizziness and giddiness: Secondary | ICD-10-CM | POA: Diagnosis not present

## 2019-11-03 DIAGNOSIS — I129 Hypertensive chronic kidney disease with stage 1 through stage 4 chronic kidney disease, or unspecified chronic kidney disease: Secondary | ICD-10-CM | POA: Diagnosis not present

## 2019-11-03 DIAGNOSIS — M1712 Unilateral primary osteoarthritis, left knee: Secondary | ICD-10-CM | POA: Diagnosis not present

## 2019-11-03 DIAGNOSIS — Z96651 Presence of right artificial knee joint: Secondary | ICD-10-CM | POA: Diagnosis not present

## 2019-11-03 DIAGNOSIS — G8929 Other chronic pain: Secondary | ICD-10-CM | POA: Diagnosis not present

## 2019-11-03 DIAGNOSIS — I69254 Hemiplegia and hemiparesis following other nontraumatic intracranial hemorrhage affecting left non-dominant side: Secondary | ICD-10-CM | POA: Diagnosis not present

## 2019-11-03 DIAGNOSIS — Z87891 Personal history of nicotine dependence: Secondary | ICD-10-CM | POA: Diagnosis not present

## 2019-11-03 DIAGNOSIS — E1122 Type 2 diabetes mellitus with diabetic chronic kidney disease: Secondary | ICD-10-CM | POA: Diagnosis not present

## 2019-11-03 DIAGNOSIS — M479 Spondylosis, unspecified: Secondary | ICD-10-CM | POA: Diagnosis not present

## 2019-11-03 DIAGNOSIS — Z7902 Long term (current) use of antithrombotics/antiplatelets: Secondary | ICD-10-CM | POA: Diagnosis not present

## 2019-11-03 DIAGNOSIS — Z87440 Personal history of urinary (tract) infections: Secondary | ICD-10-CM | POA: Diagnosis not present

## 2019-11-09 DIAGNOSIS — N189 Chronic kidney disease, unspecified: Secondary | ICD-10-CM | POA: Diagnosis not present

## 2019-11-09 DIAGNOSIS — Z87440 Personal history of urinary (tract) infections: Secondary | ICD-10-CM | POA: Diagnosis not present

## 2019-11-09 DIAGNOSIS — Z96651 Presence of right artificial knee joint: Secondary | ICD-10-CM | POA: Diagnosis not present

## 2019-11-09 DIAGNOSIS — I69254 Hemiplegia and hemiparesis following other nontraumatic intracranial hemorrhage affecting left non-dominant side: Secondary | ICD-10-CM | POA: Diagnosis not present

## 2019-11-09 DIAGNOSIS — E1151 Type 2 diabetes mellitus with diabetic peripheral angiopathy without gangrene: Secondary | ICD-10-CM | POA: Diagnosis not present

## 2019-11-09 DIAGNOSIS — J449 Chronic obstructive pulmonary disease, unspecified: Secondary | ICD-10-CM | POA: Diagnosis not present

## 2019-11-09 DIAGNOSIS — M479 Spondylosis, unspecified: Secondary | ICD-10-CM | POA: Diagnosis not present

## 2019-11-09 DIAGNOSIS — E1122 Type 2 diabetes mellitus with diabetic chronic kidney disease: Secondary | ICD-10-CM | POA: Diagnosis not present

## 2019-11-09 DIAGNOSIS — Z981 Arthrodesis status: Secondary | ICD-10-CM | POA: Diagnosis not present

## 2019-11-09 DIAGNOSIS — Z7902 Long term (current) use of antithrombotics/antiplatelets: Secondary | ICD-10-CM | POA: Diagnosis not present

## 2019-11-09 DIAGNOSIS — G8929 Other chronic pain: Secondary | ICD-10-CM | POA: Diagnosis not present

## 2019-11-09 DIAGNOSIS — M1712 Unilateral primary osteoarthritis, left knee: Secondary | ICD-10-CM | POA: Diagnosis not present

## 2019-11-09 DIAGNOSIS — Z7984 Long term (current) use of oral hypoglycemic drugs: Secondary | ICD-10-CM | POA: Diagnosis not present

## 2019-11-09 DIAGNOSIS — Z79891 Long term (current) use of opiate analgesic: Secondary | ICD-10-CM | POA: Diagnosis not present

## 2019-11-09 DIAGNOSIS — E785 Hyperlipidemia, unspecified: Secondary | ICD-10-CM | POA: Diagnosis not present

## 2019-11-09 DIAGNOSIS — I129 Hypertensive chronic kidney disease with stage 1 through stage 4 chronic kidney disease, or unspecified chronic kidney disease: Secondary | ICD-10-CM | POA: Diagnosis not present

## 2019-11-09 DIAGNOSIS — Z87891 Personal history of nicotine dependence: Secondary | ICD-10-CM | POA: Diagnosis not present

## 2019-11-09 DIAGNOSIS — R42 Dizziness and giddiness: Secondary | ICD-10-CM | POA: Diagnosis not present

## 2019-11-10 DIAGNOSIS — I69254 Hemiplegia and hemiparesis following other nontraumatic intracranial hemorrhage affecting left non-dominant side: Secondary | ICD-10-CM | POA: Diagnosis not present

## 2019-11-10 DIAGNOSIS — Z7984 Long term (current) use of oral hypoglycemic drugs: Secondary | ICD-10-CM | POA: Diagnosis not present

## 2019-11-10 DIAGNOSIS — E785 Hyperlipidemia, unspecified: Secondary | ICD-10-CM | POA: Diagnosis not present

## 2019-11-10 DIAGNOSIS — Z79891 Long term (current) use of opiate analgesic: Secondary | ICD-10-CM | POA: Diagnosis not present

## 2019-11-10 DIAGNOSIS — Z87440 Personal history of urinary (tract) infections: Secondary | ICD-10-CM | POA: Diagnosis not present

## 2019-11-10 DIAGNOSIS — Z87891 Personal history of nicotine dependence: Secondary | ICD-10-CM | POA: Diagnosis not present

## 2019-11-10 DIAGNOSIS — R42 Dizziness and giddiness: Secondary | ICD-10-CM | POA: Diagnosis not present

## 2019-11-10 DIAGNOSIS — Z981 Arthrodesis status: Secondary | ICD-10-CM | POA: Diagnosis not present

## 2019-11-10 DIAGNOSIS — N189 Chronic kidney disease, unspecified: Secondary | ICD-10-CM | POA: Diagnosis not present

## 2019-11-10 DIAGNOSIS — G8929 Other chronic pain: Secondary | ICD-10-CM | POA: Diagnosis not present

## 2019-11-10 DIAGNOSIS — M1712 Unilateral primary osteoarthritis, left knee: Secondary | ICD-10-CM | POA: Diagnosis not present

## 2019-11-10 DIAGNOSIS — Z7902 Long term (current) use of antithrombotics/antiplatelets: Secondary | ICD-10-CM | POA: Diagnosis not present

## 2019-11-10 DIAGNOSIS — E1151 Type 2 diabetes mellitus with diabetic peripheral angiopathy without gangrene: Secondary | ICD-10-CM | POA: Diagnosis not present

## 2019-11-10 DIAGNOSIS — I129 Hypertensive chronic kidney disease with stage 1 through stage 4 chronic kidney disease, or unspecified chronic kidney disease: Secondary | ICD-10-CM | POA: Diagnosis not present

## 2019-11-10 DIAGNOSIS — E1122 Type 2 diabetes mellitus with diabetic chronic kidney disease: Secondary | ICD-10-CM | POA: Diagnosis not present

## 2019-11-10 DIAGNOSIS — M479 Spondylosis, unspecified: Secondary | ICD-10-CM | POA: Diagnosis not present

## 2019-11-10 DIAGNOSIS — J449 Chronic obstructive pulmonary disease, unspecified: Secondary | ICD-10-CM | POA: Diagnosis not present

## 2019-11-10 DIAGNOSIS — Z96651 Presence of right artificial knee joint: Secondary | ICD-10-CM | POA: Diagnosis not present

## 2019-11-11 DIAGNOSIS — E7849 Other hyperlipidemia: Secondary | ICD-10-CM | POA: Diagnosis not present

## 2019-11-11 DIAGNOSIS — I10 Essential (primary) hypertension: Secondary | ICD-10-CM | POA: Diagnosis not present

## 2019-11-11 DIAGNOSIS — M6281 Muscle weakness (generalized): Secondary | ICD-10-CM | POA: Diagnosis not present

## 2019-11-11 DIAGNOSIS — E119 Type 2 diabetes mellitus without complications: Secondary | ICD-10-CM | POA: Diagnosis not present

## 2019-11-12 DIAGNOSIS — M6281 Muscle weakness (generalized): Secondary | ICD-10-CM | POA: Diagnosis not present

## 2019-11-16 DIAGNOSIS — Z79891 Long term (current) use of opiate analgesic: Secondary | ICD-10-CM | POA: Diagnosis not present

## 2019-11-16 DIAGNOSIS — J449 Chronic obstructive pulmonary disease, unspecified: Secondary | ICD-10-CM | POA: Diagnosis not present

## 2019-11-16 DIAGNOSIS — E1122 Type 2 diabetes mellitus with diabetic chronic kidney disease: Secondary | ICD-10-CM | POA: Diagnosis not present

## 2019-11-16 DIAGNOSIS — I69254 Hemiplegia and hemiparesis following other nontraumatic intracranial hemorrhage affecting left non-dominant side: Secondary | ICD-10-CM | POA: Diagnosis not present

## 2019-11-16 DIAGNOSIS — I129 Hypertensive chronic kidney disease with stage 1 through stage 4 chronic kidney disease, or unspecified chronic kidney disease: Secondary | ICD-10-CM | POA: Diagnosis not present

## 2019-11-16 DIAGNOSIS — Z87440 Personal history of urinary (tract) infections: Secondary | ICD-10-CM | POA: Diagnosis not present

## 2019-11-16 DIAGNOSIS — M1712 Unilateral primary osteoarthritis, left knee: Secondary | ICD-10-CM | POA: Diagnosis not present

## 2019-11-16 DIAGNOSIS — E1151 Type 2 diabetes mellitus with diabetic peripheral angiopathy without gangrene: Secondary | ICD-10-CM | POA: Diagnosis not present

## 2019-11-16 DIAGNOSIS — N189 Chronic kidney disease, unspecified: Secondary | ICD-10-CM | POA: Diagnosis not present

## 2019-11-16 DIAGNOSIS — Z7902 Long term (current) use of antithrombotics/antiplatelets: Secondary | ICD-10-CM | POA: Diagnosis not present

## 2019-11-16 DIAGNOSIS — Z96651 Presence of right artificial knee joint: Secondary | ICD-10-CM | POA: Diagnosis not present

## 2019-11-16 DIAGNOSIS — R42 Dizziness and giddiness: Secondary | ICD-10-CM | POA: Diagnosis not present

## 2019-11-16 DIAGNOSIS — M479 Spondylosis, unspecified: Secondary | ICD-10-CM | POA: Diagnosis not present

## 2019-11-16 DIAGNOSIS — Z87891 Personal history of nicotine dependence: Secondary | ICD-10-CM | POA: Diagnosis not present

## 2019-11-16 DIAGNOSIS — G8929 Other chronic pain: Secondary | ICD-10-CM | POA: Diagnosis not present

## 2019-11-16 DIAGNOSIS — E785 Hyperlipidemia, unspecified: Secondary | ICD-10-CM | POA: Diagnosis not present

## 2019-11-16 DIAGNOSIS — Z981 Arthrodesis status: Secondary | ICD-10-CM | POA: Diagnosis not present

## 2019-11-16 DIAGNOSIS — Z7984 Long term (current) use of oral hypoglycemic drugs: Secondary | ICD-10-CM | POA: Diagnosis not present

## 2019-11-17 DIAGNOSIS — E1151 Type 2 diabetes mellitus with diabetic peripheral angiopathy without gangrene: Secondary | ICD-10-CM | POA: Diagnosis not present

## 2019-11-17 DIAGNOSIS — M479 Spondylosis, unspecified: Secondary | ICD-10-CM | POA: Diagnosis not present

## 2019-11-17 DIAGNOSIS — I129 Hypertensive chronic kidney disease with stage 1 through stage 4 chronic kidney disease, or unspecified chronic kidney disease: Secondary | ICD-10-CM | POA: Diagnosis not present

## 2019-11-17 DIAGNOSIS — Z7984 Long term (current) use of oral hypoglycemic drugs: Secondary | ICD-10-CM | POA: Diagnosis not present

## 2019-11-17 DIAGNOSIS — E1122 Type 2 diabetes mellitus with diabetic chronic kidney disease: Secondary | ICD-10-CM | POA: Diagnosis not present

## 2019-11-17 DIAGNOSIS — G8929 Other chronic pain: Secondary | ICD-10-CM | POA: Diagnosis not present

## 2019-11-17 DIAGNOSIS — N189 Chronic kidney disease, unspecified: Secondary | ICD-10-CM | POA: Diagnosis not present

## 2019-11-17 DIAGNOSIS — Z79891 Long term (current) use of opiate analgesic: Secondary | ICD-10-CM | POA: Diagnosis not present

## 2019-11-17 DIAGNOSIS — Z981 Arthrodesis status: Secondary | ICD-10-CM | POA: Diagnosis not present

## 2019-11-17 DIAGNOSIS — Z87891 Personal history of nicotine dependence: Secondary | ICD-10-CM | POA: Diagnosis not present

## 2019-11-17 DIAGNOSIS — Z96651 Presence of right artificial knee joint: Secondary | ICD-10-CM | POA: Diagnosis not present

## 2019-11-17 DIAGNOSIS — Z7902 Long term (current) use of antithrombotics/antiplatelets: Secondary | ICD-10-CM | POA: Diagnosis not present

## 2019-11-17 DIAGNOSIS — J449 Chronic obstructive pulmonary disease, unspecified: Secondary | ICD-10-CM | POA: Diagnosis not present

## 2019-11-17 DIAGNOSIS — M1712 Unilateral primary osteoarthritis, left knee: Secondary | ICD-10-CM | POA: Diagnosis not present

## 2019-11-17 DIAGNOSIS — E785 Hyperlipidemia, unspecified: Secondary | ICD-10-CM | POA: Diagnosis not present

## 2019-11-17 DIAGNOSIS — I69254 Hemiplegia and hemiparesis following other nontraumatic intracranial hemorrhage affecting left non-dominant side: Secondary | ICD-10-CM | POA: Diagnosis not present

## 2019-11-17 DIAGNOSIS — R42 Dizziness and giddiness: Secondary | ICD-10-CM | POA: Diagnosis not present

## 2019-11-17 DIAGNOSIS — Z87440 Personal history of urinary (tract) infections: Secondary | ICD-10-CM | POA: Diagnosis not present

## 2019-11-18 DIAGNOSIS — Z7984 Long term (current) use of oral hypoglycemic drugs: Secondary | ICD-10-CM | POA: Diagnosis not present

## 2019-11-18 DIAGNOSIS — Z7902 Long term (current) use of antithrombotics/antiplatelets: Secondary | ICD-10-CM | POA: Diagnosis not present

## 2019-11-18 DIAGNOSIS — E1151 Type 2 diabetes mellitus with diabetic peripheral angiopathy without gangrene: Secondary | ICD-10-CM | POA: Diagnosis not present

## 2019-11-18 DIAGNOSIS — Z87891 Personal history of nicotine dependence: Secondary | ICD-10-CM | POA: Diagnosis not present

## 2019-11-18 DIAGNOSIS — J449 Chronic obstructive pulmonary disease, unspecified: Secondary | ICD-10-CM | POA: Diagnosis not present

## 2019-11-18 DIAGNOSIS — I129 Hypertensive chronic kidney disease with stage 1 through stage 4 chronic kidney disease, or unspecified chronic kidney disease: Secondary | ICD-10-CM | POA: Diagnosis not present

## 2019-11-18 DIAGNOSIS — E1122 Type 2 diabetes mellitus with diabetic chronic kidney disease: Secondary | ICD-10-CM | POA: Diagnosis not present

## 2019-11-18 DIAGNOSIS — G8929 Other chronic pain: Secondary | ICD-10-CM | POA: Diagnosis not present

## 2019-11-18 DIAGNOSIS — Z981 Arthrodesis status: Secondary | ICD-10-CM | POA: Diagnosis not present

## 2019-11-18 DIAGNOSIS — R42 Dizziness and giddiness: Secondary | ICD-10-CM | POA: Diagnosis not present

## 2019-11-18 DIAGNOSIS — Z87440 Personal history of urinary (tract) infections: Secondary | ICD-10-CM | POA: Diagnosis not present

## 2019-11-18 DIAGNOSIS — N189 Chronic kidney disease, unspecified: Secondary | ICD-10-CM | POA: Diagnosis not present

## 2019-11-18 DIAGNOSIS — M1712 Unilateral primary osteoarthritis, left knee: Secondary | ICD-10-CM | POA: Diagnosis not present

## 2019-11-18 DIAGNOSIS — I69254 Hemiplegia and hemiparesis following other nontraumatic intracranial hemorrhage affecting left non-dominant side: Secondary | ICD-10-CM | POA: Diagnosis not present

## 2019-11-18 DIAGNOSIS — Z79891 Long term (current) use of opiate analgesic: Secondary | ICD-10-CM | POA: Diagnosis not present

## 2019-11-18 DIAGNOSIS — M479 Spondylosis, unspecified: Secondary | ICD-10-CM | POA: Diagnosis not present

## 2019-11-18 DIAGNOSIS — E785 Hyperlipidemia, unspecified: Secondary | ICD-10-CM | POA: Diagnosis not present

## 2019-11-18 DIAGNOSIS — Z96651 Presence of right artificial knee joint: Secondary | ICD-10-CM | POA: Diagnosis not present

## 2019-11-23 DIAGNOSIS — I69254 Hemiplegia and hemiparesis following other nontraumatic intracranial hemorrhage affecting left non-dominant side: Secondary | ICD-10-CM | POA: Diagnosis not present

## 2019-11-23 DIAGNOSIS — E1151 Type 2 diabetes mellitus with diabetic peripheral angiopathy without gangrene: Secondary | ICD-10-CM | POA: Diagnosis not present

## 2019-11-23 DIAGNOSIS — Z7902 Long term (current) use of antithrombotics/antiplatelets: Secondary | ICD-10-CM | POA: Diagnosis not present

## 2019-11-23 DIAGNOSIS — G8929 Other chronic pain: Secondary | ICD-10-CM | POA: Diagnosis not present

## 2019-11-23 DIAGNOSIS — Z7984 Long term (current) use of oral hypoglycemic drugs: Secondary | ICD-10-CM | POA: Diagnosis not present

## 2019-11-23 DIAGNOSIS — Z87440 Personal history of urinary (tract) infections: Secondary | ICD-10-CM | POA: Diagnosis not present

## 2019-11-23 DIAGNOSIS — Z87891 Personal history of nicotine dependence: Secondary | ICD-10-CM | POA: Diagnosis not present

## 2019-11-23 DIAGNOSIS — E785 Hyperlipidemia, unspecified: Secondary | ICD-10-CM | POA: Diagnosis not present

## 2019-11-23 DIAGNOSIS — J449 Chronic obstructive pulmonary disease, unspecified: Secondary | ICD-10-CM | POA: Diagnosis not present

## 2019-11-23 DIAGNOSIS — M479 Spondylosis, unspecified: Secondary | ICD-10-CM | POA: Diagnosis not present

## 2019-11-23 DIAGNOSIS — N189 Chronic kidney disease, unspecified: Secondary | ICD-10-CM | POA: Diagnosis not present

## 2019-11-23 DIAGNOSIS — M1712 Unilateral primary osteoarthritis, left knee: Secondary | ICD-10-CM | POA: Diagnosis not present

## 2019-11-23 DIAGNOSIS — E1122 Type 2 diabetes mellitus with diabetic chronic kidney disease: Secondary | ICD-10-CM | POA: Diagnosis not present

## 2019-11-23 DIAGNOSIS — Z96651 Presence of right artificial knee joint: Secondary | ICD-10-CM | POA: Diagnosis not present

## 2019-11-23 DIAGNOSIS — Z981 Arthrodesis status: Secondary | ICD-10-CM | POA: Diagnosis not present

## 2019-11-23 DIAGNOSIS — Z79891 Long term (current) use of opiate analgesic: Secondary | ICD-10-CM | POA: Diagnosis not present

## 2019-11-23 DIAGNOSIS — I129 Hypertensive chronic kidney disease with stage 1 through stage 4 chronic kidney disease, or unspecified chronic kidney disease: Secondary | ICD-10-CM | POA: Diagnosis not present

## 2019-11-23 DIAGNOSIS — R42 Dizziness and giddiness: Secondary | ICD-10-CM | POA: Diagnosis not present

## 2019-11-24 DIAGNOSIS — Z981 Arthrodesis status: Secondary | ICD-10-CM | POA: Diagnosis not present

## 2019-11-24 DIAGNOSIS — G8929 Other chronic pain: Secondary | ICD-10-CM | POA: Diagnosis not present

## 2019-11-24 DIAGNOSIS — Z87891 Personal history of nicotine dependence: Secondary | ICD-10-CM | POA: Diagnosis not present

## 2019-11-24 DIAGNOSIS — I69254 Hemiplegia and hemiparesis following other nontraumatic intracranial hemorrhage affecting left non-dominant side: Secondary | ICD-10-CM | POA: Diagnosis not present

## 2019-11-24 DIAGNOSIS — Z7902 Long term (current) use of antithrombotics/antiplatelets: Secondary | ICD-10-CM | POA: Diagnosis not present

## 2019-11-24 DIAGNOSIS — I129 Hypertensive chronic kidney disease with stage 1 through stage 4 chronic kidney disease, or unspecified chronic kidney disease: Secondary | ICD-10-CM | POA: Diagnosis not present

## 2019-11-24 DIAGNOSIS — N189 Chronic kidney disease, unspecified: Secondary | ICD-10-CM | POA: Diagnosis not present

## 2019-11-24 DIAGNOSIS — R42 Dizziness and giddiness: Secondary | ICD-10-CM | POA: Diagnosis not present

## 2019-11-24 DIAGNOSIS — J449 Chronic obstructive pulmonary disease, unspecified: Secondary | ICD-10-CM | POA: Diagnosis not present

## 2019-11-24 DIAGNOSIS — M479 Spondylosis, unspecified: Secondary | ICD-10-CM | POA: Diagnosis not present

## 2019-11-24 DIAGNOSIS — Z79891 Long term (current) use of opiate analgesic: Secondary | ICD-10-CM | POA: Diagnosis not present

## 2019-11-24 DIAGNOSIS — E1122 Type 2 diabetes mellitus with diabetic chronic kidney disease: Secondary | ICD-10-CM | POA: Diagnosis not present

## 2019-11-24 DIAGNOSIS — E1151 Type 2 diabetes mellitus with diabetic peripheral angiopathy without gangrene: Secondary | ICD-10-CM | POA: Diagnosis not present

## 2019-11-24 DIAGNOSIS — M1712 Unilateral primary osteoarthritis, left knee: Secondary | ICD-10-CM | POA: Diagnosis not present

## 2019-11-24 DIAGNOSIS — E785 Hyperlipidemia, unspecified: Secondary | ICD-10-CM | POA: Diagnosis not present

## 2019-11-24 DIAGNOSIS — Z7984 Long term (current) use of oral hypoglycemic drugs: Secondary | ICD-10-CM | POA: Diagnosis not present

## 2019-11-24 DIAGNOSIS — Z96651 Presence of right artificial knee joint: Secondary | ICD-10-CM | POA: Diagnosis not present

## 2019-11-24 DIAGNOSIS — Z87440 Personal history of urinary (tract) infections: Secondary | ICD-10-CM | POA: Diagnosis not present

## 2019-11-30 DIAGNOSIS — I69254 Hemiplegia and hemiparesis following other nontraumatic intracranial hemorrhage affecting left non-dominant side: Secondary | ICD-10-CM | POA: Diagnosis not present

## 2019-11-30 DIAGNOSIS — Z981 Arthrodesis status: Secondary | ICD-10-CM | POA: Diagnosis not present

## 2019-11-30 DIAGNOSIS — Z7902 Long term (current) use of antithrombotics/antiplatelets: Secondary | ICD-10-CM | POA: Diagnosis not present

## 2019-11-30 DIAGNOSIS — M1712 Unilateral primary osteoarthritis, left knee: Secondary | ICD-10-CM | POA: Diagnosis not present

## 2019-11-30 DIAGNOSIS — Z87891 Personal history of nicotine dependence: Secondary | ICD-10-CM | POA: Diagnosis not present

## 2019-11-30 DIAGNOSIS — Z7984 Long term (current) use of oral hypoglycemic drugs: Secondary | ICD-10-CM | POA: Diagnosis not present

## 2019-11-30 DIAGNOSIS — J449 Chronic obstructive pulmonary disease, unspecified: Secondary | ICD-10-CM | POA: Diagnosis not present

## 2019-11-30 DIAGNOSIS — E785 Hyperlipidemia, unspecified: Secondary | ICD-10-CM | POA: Diagnosis not present

## 2019-11-30 DIAGNOSIS — E1151 Type 2 diabetes mellitus with diabetic peripheral angiopathy without gangrene: Secondary | ICD-10-CM | POA: Diagnosis not present

## 2019-11-30 DIAGNOSIS — Z96651 Presence of right artificial knee joint: Secondary | ICD-10-CM | POA: Diagnosis not present

## 2019-11-30 DIAGNOSIS — M479 Spondylosis, unspecified: Secondary | ICD-10-CM | POA: Diagnosis not present

## 2019-11-30 DIAGNOSIS — I129 Hypertensive chronic kidney disease with stage 1 through stage 4 chronic kidney disease, or unspecified chronic kidney disease: Secondary | ICD-10-CM | POA: Diagnosis not present

## 2019-11-30 DIAGNOSIS — N189 Chronic kidney disease, unspecified: Secondary | ICD-10-CM | POA: Diagnosis not present

## 2019-11-30 DIAGNOSIS — Z87440 Personal history of urinary (tract) infections: Secondary | ICD-10-CM | POA: Diagnosis not present

## 2019-11-30 DIAGNOSIS — R42 Dizziness and giddiness: Secondary | ICD-10-CM | POA: Diagnosis not present

## 2019-11-30 DIAGNOSIS — Z79891 Long term (current) use of opiate analgesic: Secondary | ICD-10-CM | POA: Diagnosis not present

## 2019-11-30 DIAGNOSIS — E1122 Type 2 diabetes mellitus with diabetic chronic kidney disease: Secondary | ICD-10-CM | POA: Diagnosis not present

## 2019-11-30 DIAGNOSIS — G8929 Other chronic pain: Secondary | ICD-10-CM | POA: Diagnosis not present

## 2019-12-01 DIAGNOSIS — E1151 Type 2 diabetes mellitus with diabetic peripheral angiopathy without gangrene: Secondary | ICD-10-CM | POA: Diagnosis not present

## 2019-12-01 DIAGNOSIS — J449 Chronic obstructive pulmonary disease, unspecified: Secondary | ICD-10-CM | POA: Diagnosis not present

## 2019-12-01 DIAGNOSIS — E1122 Type 2 diabetes mellitus with diabetic chronic kidney disease: Secondary | ICD-10-CM | POA: Diagnosis not present

## 2019-12-01 DIAGNOSIS — N189 Chronic kidney disease, unspecified: Secondary | ICD-10-CM | POA: Diagnosis not present

## 2019-12-01 DIAGNOSIS — I69254 Hemiplegia and hemiparesis following other nontraumatic intracranial hemorrhage affecting left non-dominant side: Secondary | ICD-10-CM | POA: Diagnosis not present

## 2019-12-01 DIAGNOSIS — E785 Hyperlipidemia, unspecified: Secondary | ICD-10-CM | POA: Diagnosis not present

## 2019-12-01 DIAGNOSIS — Z96651 Presence of right artificial knee joint: Secondary | ICD-10-CM | POA: Diagnosis not present

## 2019-12-01 DIAGNOSIS — Z79891 Long term (current) use of opiate analgesic: Secondary | ICD-10-CM | POA: Diagnosis not present

## 2019-12-01 DIAGNOSIS — Z7902 Long term (current) use of antithrombotics/antiplatelets: Secondary | ICD-10-CM | POA: Diagnosis not present

## 2019-12-01 DIAGNOSIS — G8929 Other chronic pain: Secondary | ICD-10-CM | POA: Diagnosis not present

## 2019-12-01 DIAGNOSIS — Z7984 Long term (current) use of oral hypoglycemic drugs: Secondary | ICD-10-CM | POA: Diagnosis not present

## 2019-12-01 DIAGNOSIS — R42 Dizziness and giddiness: Secondary | ICD-10-CM | POA: Diagnosis not present

## 2019-12-01 DIAGNOSIS — M479 Spondylosis, unspecified: Secondary | ICD-10-CM | POA: Diagnosis not present

## 2019-12-01 DIAGNOSIS — I129 Hypertensive chronic kidney disease with stage 1 through stage 4 chronic kidney disease, or unspecified chronic kidney disease: Secondary | ICD-10-CM | POA: Diagnosis not present

## 2019-12-01 DIAGNOSIS — Z87891 Personal history of nicotine dependence: Secondary | ICD-10-CM | POA: Diagnosis not present

## 2019-12-01 DIAGNOSIS — M1712 Unilateral primary osteoarthritis, left knee: Secondary | ICD-10-CM | POA: Diagnosis not present

## 2019-12-01 DIAGNOSIS — Z981 Arthrodesis status: Secondary | ICD-10-CM | POA: Diagnosis not present

## 2019-12-01 DIAGNOSIS — Z87440 Personal history of urinary (tract) infections: Secondary | ICD-10-CM | POA: Diagnosis not present

## 2019-12-07 DIAGNOSIS — I69254 Hemiplegia and hemiparesis following other nontraumatic intracranial hemorrhage affecting left non-dominant side: Secondary | ICD-10-CM | POA: Diagnosis not present

## 2019-12-07 DIAGNOSIS — J449 Chronic obstructive pulmonary disease, unspecified: Secondary | ICD-10-CM | POA: Diagnosis not present

## 2019-12-07 DIAGNOSIS — M479 Spondylosis, unspecified: Secondary | ICD-10-CM | POA: Diagnosis not present

## 2019-12-07 DIAGNOSIS — Z981 Arthrodesis status: Secondary | ICD-10-CM | POA: Diagnosis not present

## 2019-12-07 DIAGNOSIS — R42 Dizziness and giddiness: Secondary | ICD-10-CM | POA: Diagnosis not present

## 2019-12-07 DIAGNOSIS — Z87440 Personal history of urinary (tract) infections: Secondary | ICD-10-CM | POA: Diagnosis not present

## 2019-12-07 DIAGNOSIS — Z7902 Long term (current) use of antithrombotics/antiplatelets: Secondary | ICD-10-CM | POA: Diagnosis not present

## 2019-12-07 DIAGNOSIS — G8929 Other chronic pain: Secondary | ICD-10-CM | POA: Diagnosis not present

## 2019-12-07 DIAGNOSIS — Z79891 Long term (current) use of opiate analgesic: Secondary | ICD-10-CM | POA: Diagnosis not present

## 2019-12-07 DIAGNOSIS — Z96651 Presence of right artificial knee joint: Secondary | ICD-10-CM | POA: Diagnosis not present

## 2019-12-07 DIAGNOSIS — M1712 Unilateral primary osteoarthritis, left knee: Secondary | ICD-10-CM | POA: Diagnosis not present

## 2019-12-07 DIAGNOSIS — I129 Hypertensive chronic kidney disease with stage 1 through stage 4 chronic kidney disease, or unspecified chronic kidney disease: Secondary | ICD-10-CM | POA: Diagnosis not present

## 2019-12-07 DIAGNOSIS — Z87891 Personal history of nicotine dependence: Secondary | ICD-10-CM | POA: Diagnosis not present

## 2019-12-07 DIAGNOSIS — E785 Hyperlipidemia, unspecified: Secondary | ICD-10-CM | POA: Diagnosis not present

## 2019-12-07 DIAGNOSIS — N189 Chronic kidney disease, unspecified: Secondary | ICD-10-CM | POA: Diagnosis not present

## 2019-12-07 DIAGNOSIS — Z7984 Long term (current) use of oral hypoglycemic drugs: Secondary | ICD-10-CM | POA: Diagnosis not present

## 2019-12-07 DIAGNOSIS — E1122 Type 2 diabetes mellitus with diabetic chronic kidney disease: Secondary | ICD-10-CM | POA: Diagnosis not present

## 2019-12-07 DIAGNOSIS — E1151 Type 2 diabetes mellitus with diabetic peripheral angiopathy without gangrene: Secondary | ICD-10-CM | POA: Diagnosis not present

## 2019-12-08 DIAGNOSIS — Z96651 Presence of right artificial knee joint: Secondary | ICD-10-CM | POA: Diagnosis not present

## 2019-12-08 DIAGNOSIS — Z7984 Long term (current) use of oral hypoglycemic drugs: Secondary | ICD-10-CM | POA: Diagnosis not present

## 2019-12-08 DIAGNOSIS — J449 Chronic obstructive pulmonary disease, unspecified: Secondary | ICD-10-CM | POA: Diagnosis not present

## 2019-12-08 DIAGNOSIS — E1151 Type 2 diabetes mellitus with diabetic peripheral angiopathy without gangrene: Secondary | ICD-10-CM | POA: Diagnosis not present

## 2019-12-08 DIAGNOSIS — R42 Dizziness and giddiness: Secondary | ICD-10-CM | POA: Diagnosis not present

## 2019-12-08 DIAGNOSIS — Z87440 Personal history of urinary (tract) infections: Secondary | ICD-10-CM | POA: Diagnosis not present

## 2019-12-08 DIAGNOSIS — Z79891 Long term (current) use of opiate analgesic: Secondary | ICD-10-CM | POA: Diagnosis not present

## 2019-12-08 DIAGNOSIS — M479 Spondylosis, unspecified: Secondary | ICD-10-CM | POA: Diagnosis not present

## 2019-12-08 DIAGNOSIS — G8929 Other chronic pain: Secondary | ICD-10-CM | POA: Diagnosis not present

## 2019-12-08 DIAGNOSIS — I69254 Hemiplegia and hemiparesis following other nontraumatic intracranial hemorrhage affecting left non-dominant side: Secondary | ICD-10-CM | POA: Diagnosis not present

## 2019-12-08 DIAGNOSIS — Z981 Arthrodesis status: Secondary | ICD-10-CM | POA: Diagnosis not present

## 2019-12-08 DIAGNOSIS — E785 Hyperlipidemia, unspecified: Secondary | ICD-10-CM | POA: Diagnosis not present

## 2019-12-08 DIAGNOSIS — M1712 Unilateral primary osteoarthritis, left knee: Secondary | ICD-10-CM | POA: Diagnosis not present

## 2019-12-08 DIAGNOSIS — I129 Hypertensive chronic kidney disease with stage 1 through stage 4 chronic kidney disease, or unspecified chronic kidney disease: Secondary | ICD-10-CM | POA: Diagnosis not present

## 2019-12-08 DIAGNOSIS — E1122 Type 2 diabetes mellitus with diabetic chronic kidney disease: Secondary | ICD-10-CM | POA: Diagnosis not present

## 2019-12-08 DIAGNOSIS — Z7902 Long term (current) use of antithrombotics/antiplatelets: Secondary | ICD-10-CM | POA: Diagnosis not present

## 2019-12-08 DIAGNOSIS — N189 Chronic kidney disease, unspecified: Secondary | ICD-10-CM | POA: Diagnosis not present

## 2019-12-08 DIAGNOSIS — Z87891 Personal history of nicotine dependence: Secondary | ICD-10-CM | POA: Diagnosis not present

## 2019-12-09 DIAGNOSIS — M1712 Unilateral primary osteoarthritis, left knee: Secondary | ICD-10-CM | POA: Diagnosis not present

## 2019-12-09 DIAGNOSIS — N189 Chronic kidney disease, unspecified: Secondary | ICD-10-CM | POA: Diagnosis not present

## 2019-12-09 DIAGNOSIS — J449 Chronic obstructive pulmonary disease, unspecified: Secondary | ICD-10-CM | POA: Diagnosis not present

## 2019-12-09 DIAGNOSIS — Z79891 Long term (current) use of opiate analgesic: Secondary | ICD-10-CM | POA: Diagnosis not present

## 2019-12-09 DIAGNOSIS — Z7984 Long term (current) use of oral hypoglycemic drugs: Secondary | ICD-10-CM | POA: Diagnosis not present

## 2019-12-09 DIAGNOSIS — I69254 Hemiplegia and hemiparesis following other nontraumatic intracranial hemorrhage affecting left non-dominant side: Secondary | ICD-10-CM | POA: Diagnosis not present

## 2019-12-09 DIAGNOSIS — E1122 Type 2 diabetes mellitus with diabetic chronic kidney disease: Secondary | ICD-10-CM | POA: Diagnosis not present

## 2019-12-09 DIAGNOSIS — E1151 Type 2 diabetes mellitus with diabetic peripheral angiopathy without gangrene: Secondary | ICD-10-CM | POA: Diagnosis not present

## 2019-12-09 DIAGNOSIS — Z981 Arthrodesis status: Secondary | ICD-10-CM | POA: Diagnosis not present

## 2019-12-09 DIAGNOSIS — G8929 Other chronic pain: Secondary | ICD-10-CM | POA: Diagnosis not present

## 2019-12-09 DIAGNOSIS — Z7902 Long term (current) use of antithrombotics/antiplatelets: Secondary | ICD-10-CM | POA: Diagnosis not present

## 2019-12-09 DIAGNOSIS — M479 Spondylosis, unspecified: Secondary | ICD-10-CM | POA: Diagnosis not present

## 2019-12-09 DIAGNOSIS — I129 Hypertensive chronic kidney disease with stage 1 through stage 4 chronic kidney disease, or unspecified chronic kidney disease: Secondary | ICD-10-CM | POA: Diagnosis not present

## 2019-12-09 DIAGNOSIS — Z96651 Presence of right artificial knee joint: Secondary | ICD-10-CM | POA: Diagnosis not present

## 2019-12-09 DIAGNOSIS — Z87440 Personal history of urinary (tract) infections: Secondary | ICD-10-CM | POA: Diagnosis not present

## 2019-12-09 DIAGNOSIS — R42 Dizziness and giddiness: Secondary | ICD-10-CM | POA: Diagnosis not present

## 2019-12-09 DIAGNOSIS — E785 Hyperlipidemia, unspecified: Secondary | ICD-10-CM | POA: Diagnosis not present

## 2019-12-09 DIAGNOSIS — Z87891 Personal history of nicotine dependence: Secondary | ICD-10-CM | POA: Diagnosis not present

## 2019-12-11 DIAGNOSIS — M6281 Muscle weakness (generalized): Secondary | ICD-10-CM | POA: Diagnosis not present

## 2019-12-12 DIAGNOSIS — M6281 Muscle weakness (generalized): Secondary | ICD-10-CM | POA: Diagnosis not present

## 2019-12-12 DIAGNOSIS — R55 Syncope and collapse: Secondary | ICD-10-CM | POA: Diagnosis not present

## 2019-12-23 DIAGNOSIS — I10 Essential (primary) hypertension: Secondary | ICD-10-CM | POA: Diagnosis not present

## 2019-12-23 DIAGNOSIS — E119 Type 2 diabetes mellitus without complications: Secondary | ICD-10-CM | POA: Diagnosis not present

## 2019-12-23 DIAGNOSIS — E7849 Other hyperlipidemia: Secondary | ICD-10-CM | POA: Diagnosis not present

## 2019-12-30 DIAGNOSIS — I10 Essential (primary) hypertension: Secondary | ICD-10-CM | POA: Diagnosis not present

## 2019-12-30 DIAGNOSIS — D0470 Carcinoma in situ of skin of unspecified lower limb, including hip: Secondary | ICD-10-CM | POA: Diagnosis not present

## 2019-12-30 DIAGNOSIS — E7849 Other hyperlipidemia: Secondary | ICD-10-CM | POA: Diagnosis not present

## 2019-12-30 DIAGNOSIS — L278 Dermatitis due to other substances taken internally: Secondary | ICD-10-CM | POA: Diagnosis not present

## 2019-12-30 DIAGNOSIS — E119 Type 2 diabetes mellitus without complications: Secondary | ICD-10-CM | POA: Diagnosis not present

## 2020-01-11 DIAGNOSIS — Z981 Arthrodesis status: Secondary | ICD-10-CM | POA: Diagnosis not present

## 2020-01-11 DIAGNOSIS — Z96653 Presence of artificial knee joint, bilateral: Secondary | ICD-10-CM | POA: Diagnosis not present

## 2020-01-11 DIAGNOSIS — R9082 White matter disease, unspecified: Secondary | ICD-10-CM | POA: Diagnosis not present

## 2020-01-11 DIAGNOSIS — Z7984 Long term (current) use of oral hypoglycemic drugs: Secondary | ICD-10-CM | POA: Diagnosis not present

## 2020-01-11 DIAGNOSIS — Z7901 Long term (current) use of anticoagulants: Secondary | ICD-10-CM | POA: Diagnosis not present

## 2020-01-11 DIAGNOSIS — I10 Essential (primary) hypertension: Secondary | ICD-10-CM | POA: Diagnosis not present

## 2020-01-11 DIAGNOSIS — I69354 Hemiplegia and hemiparesis following cerebral infarction affecting left non-dominant side: Secondary | ICD-10-CM | POA: Diagnosis not present

## 2020-01-11 DIAGNOSIS — M6281 Muscle weakness (generalized): Secondary | ICD-10-CM | POA: Diagnosis not present

## 2020-01-11 DIAGNOSIS — J449 Chronic obstructive pulmonary disease, unspecified: Secondary | ICD-10-CM | POA: Diagnosis not present

## 2020-01-11 DIAGNOSIS — Z79899 Other long term (current) drug therapy: Secondary | ICD-10-CM | POA: Diagnosis not present

## 2020-01-11 DIAGNOSIS — G9389 Other specified disorders of brain: Secondary | ICD-10-CM | POA: Diagnosis not present

## 2020-01-11 DIAGNOSIS — E119 Type 2 diabetes mellitus without complications: Secondary | ICD-10-CM | POA: Diagnosis not present

## 2020-01-11 DIAGNOSIS — R531 Weakness: Secondary | ICD-10-CM | POA: Diagnosis not present

## 2020-01-11 DIAGNOSIS — I618 Other nontraumatic intracerebral hemorrhage: Secondary | ICD-10-CM | POA: Diagnosis not present

## 2020-01-12 DIAGNOSIS — R55 Syncope and collapse: Secondary | ICD-10-CM | POA: Diagnosis not present

## 2020-01-12 DIAGNOSIS — M6281 Muscle weakness (generalized): Secondary | ICD-10-CM | POA: Diagnosis not present

## 2020-01-20 DIAGNOSIS — E7849 Other hyperlipidemia: Secondary | ICD-10-CM | POA: Diagnosis not present

## 2020-01-20 DIAGNOSIS — I10 Essential (primary) hypertension: Secondary | ICD-10-CM | POA: Diagnosis not present

## 2020-01-20 DIAGNOSIS — E119 Type 2 diabetes mellitus without complications: Secondary | ICD-10-CM | POA: Diagnosis not present

## 2020-01-27 DIAGNOSIS — J45909 Unspecified asthma, uncomplicated: Secondary | ICD-10-CM | POA: Diagnosis not present

## 2020-01-27 DIAGNOSIS — I509 Heart failure, unspecified: Secondary | ICD-10-CM | POA: Diagnosis not present

## 2020-02-11 DIAGNOSIS — M6281 Muscle weakness (generalized): Secondary | ICD-10-CM | POA: Diagnosis not present

## 2020-02-12 DIAGNOSIS — M6281 Muscle weakness (generalized): Secondary | ICD-10-CM | POA: Diagnosis not present

## 2020-02-15 DIAGNOSIS — E7849 Other hyperlipidemia: Secondary | ICD-10-CM | POA: Diagnosis not present

## 2020-02-15 DIAGNOSIS — E119 Type 2 diabetes mellitus without complications: Secondary | ICD-10-CM | POA: Diagnosis not present

## 2020-02-15 DIAGNOSIS — I10 Essential (primary) hypertension: Secondary | ICD-10-CM | POA: Diagnosis not present

## 2020-03-09 DIAGNOSIS — R262 Difficulty in walking, not elsewhere classified: Secondary | ICD-10-CM | POA: Diagnosis not present

## 2020-03-09 DIAGNOSIS — E1140 Type 2 diabetes mellitus with diabetic neuropathy, unspecified: Secondary | ICD-10-CM | POA: Diagnosis not present

## 2020-03-09 DIAGNOSIS — R296 Repeated falls: Secondary | ICD-10-CM | POA: Diagnosis not present

## 2020-03-09 DIAGNOSIS — M6281 Muscle weakness (generalized): Secondary | ICD-10-CM | POA: Diagnosis not present

## 2020-03-12 DIAGNOSIS — R55 Syncope and collapse: Secondary | ICD-10-CM | POA: Diagnosis not present

## 2020-03-12 DIAGNOSIS — M6281 Muscle weakness (generalized): Secondary | ICD-10-CM | POA: Diagnosis not present

## 2020-03-13 DIAGNOSIS — E1140 Type 2 diabetes mellitus with diabetic neuropathy, unspecified: Secondary | ICD-10-CM | POA: Diagnosis not present

## 2020-03-13 DIAGNOSIS — R296 Repeated falls: Secondary | ICD-10-CM | POA: Diagnosis not present

## 2020-03-13 DIAGNOSIS — R262 Difficulty in walking, not elsewhere classified: Secondary | ICD-10-CM | POA: Diagnosis not present

## 2020-03-13 DIAGNOSIS — M6281 Muscle weakness (generalized): Secondary | ICD-10-CM | POA: Diagnosis not present

## 2020-03-15 DIAGNOSIS — M6281 Muscle weakness (generalized): Secondary | ICD-10-CM | POA: Diagnosis not present

## 2020-03-15 DIAGNOSIS — E1140 Type 2 diabetes mellitus with diabetic neuropathy, unspecified: Secondary | ICD-10-CM | POA: Diagnosis not present

## 2020-03-15 DIAGNOSIS — R262 Difficulty in walking, not elsewhere classified: Secondary | ICD-10-CM | POA: Diagnosis not present

## 2020-03-15 DIAGNOSIS — R296 Repeated falls: Secondary | ICD-10-CM | POA: Diagnosis not present

## 2020-03-21 DIAGNOSIS — M6281 Muscle weakness (generalized): Secondary | ICD-10-CM | POA: Diagnosis not present

## 2020-03-21 DIAGNOSIS — E1140 Type 2 diabetes mellitus with diabetic neuropathy, unspecified: Secondary | ICD-10-CM | POA: Diagnosis not present

## 2020-03-21 DIAGNOSIS — R262 Difficulty in walking, not elsewhere classified: Secondary | ICD-10-CM | POA: Diagnosis not present

## 2020-03-21 DIAGNOSIS — R296 Repeated falls: Secondary | ICD-10-CM | POA: Diagnosis not present

## 2020-03-23 DIAGNOSIS — R262 Difficulty in walking, not elsewhere classified: Secondary | ICD-10-CM | POA: Diagnosis not present

## 2020-03-23 DIAGNOSIS — E1140 Type 2 diabetes mellitus with diabetic neuropathy, unspecified: Secondary | ICD-10-CM | POA: Diagnosis not present

## 2020-03-23 DIAGNOSIS — R296 Repeated falls: Secondary | ICD-10-CM | POA: Diagnosis not present

## 2020-03-23 DIAGNOSIS — M6281 Muscle weakness (generalized): Secondary | ICD-10-CM | POA: Diagnosis not present

## 2020-03-27 DIAGNOSIS — R296 Repeated falls: Secondary | ICD-10-CM | POA: Diagnosis not present

## 2020-03-27 DIAGNOSIS — E1140 Type 2 diabetes mellitus with diabetic neuropathy, unspecified: Secondary | ICD-10-CM | POA: Diagnosis not present

## 2020-03-27 DIAGNOSIS — M6281 Muscle weakness (generalized): Secondary | ICD-10-CM | POA: Diagnosis not present

## 2020-03-27 DIAGNOSIS — R262 Difficulty in walking, not elsewhere classified: Secondary | ICD-10-CM | POA: Diagnosis not present

## 2020-03-29 DIAGNOSIS — R296 Repeated falls: Secondary | ICD-10-CM | POA: Diagnosis not present

## 2020-03-29 DIAGNOSIS — M6281 Muscle weakness (generalized): Secondary | ICD-10-CM | POA: Diagnosis not present

## 2020-03-29 DIAGNOSIS — E1140 Type 2 diabetes mellitus with diabetic neuropathy, unspecified: Secondary | ICD-10-CM | POA: Diagnosis not present

## 2020-03-29 DIAGNOSIS — R262 Difficulty in walking, not elsewhere classified: Secondary | ICD-10-CM | POA: Diagnosis not present

## 2020-04-03 DIAGNOSIS — R262 Difficulty in walking, not elsewhere classified: Secondary | ICD-10-CM | POA: Diagnosis not present

## 2020-04-03 DIAGNOSIS — E1140 Type 2 diabetes mellitus with diabetic neuropathy, unspecified: Secondary | ICD-10-CM | POA: Diagnosis not present

## 2020-04-03 DIAGNOSIS — R296 Repeated falls: Secondary | ICD-10-CM | POA: Diagnosis not present

## 2020-04-03 DIAGNOSIS — M6281 Muscle weakness (generalized): Secondary | ICD-10-CM | POA: Diagnosis not present

## 2020-04-05 DIAGNOSIS — R296 Repeated falls: Secondary | ICD-10-CM | POA: Diagnosis not present

## 2020-04-05 DIAGNOSIS — M6281 Muscle weakness (generalized): Secondary | ICD-10-CM | POA: Diagnosis not present

## 2020-04-05 DIAGNOSIS — E1140 Type 2 diabetes mellitus with diabetic neuropathy, unspecified: Secondary | ICD-10-CM | POA: Diagnosis not present

## 2020-04-05 DIAGNOSIS — R262 Difficulty in walking, not elsewhere classified: Secondary | ICD-10-CM | POA: Diagnosis not present

## 2020-04-06 DIAGNOSIS — E119 Type 2 diabetes mellitus without complications: Secondary | ICD-10-CM | POA: Diagnosis not present

## 2020-04-06 DIAGNOSIS — I10 Essential (primary) hypertension: Secondary | ICD-10-CM | POA: Diagnosis not present

## 2020-04-06 DIAGNOSIS — E7849 Other hyperlipidemia: Secondary | ICD-10-CM | POA: Diagnosis not present

## 2020-04-10 DIAGNOSIS — M6281 Muscle weakness (generalized): Secondary | ICD-10-CM | POA: Diagnosis not present

## 2020-04-10 DIAGNOSIS — R296 Repeated falls: Secondary | ICD-10-CM | POA: Diagnosis not present

## 2020-04-10 DIAGNOSIS — E1140 Type 2 diabetes mellitus with diabetic neuropathy, unspecified: Secondary | ICD-10-CM | POA: Diagnosis not present

## 2020-04-10 DIAGNOSIS — R262 Difficulty in walking, not elsewhere classified: Secondary | ICD-10-CM | POA: Diagnosis not present

## 2020-04-12 DIAGNOSIS — M6281 Muscle weakness (generalized): Secondary | ICD-10-CM | POA: Diagnosis not present

## 2020-04-12 DIAGNOSIS — R262 Difficulty in walking, not elsewhere classified: Secondary | ICD-10-CM | POA: Diagnosis not present

## 2020-04-12 DIAGNOSIS — R296 Repeated falls: Secondary | ICD-10-CM | POA: Diagnosis not present

## 2020-04-12 DIAGNOSIS — E1140 Type 2 diabetes mellitus with diabetic neuropathy, unspecified: Secondary | ICD-10-CM | POA: Diagnosis not present

## 2020-04-13 DIAGNOSIS — M6281 Muscle weakness (generalized): Secondary | ICD-10-CM | POA: Diagnosis not present

## 2020-04-13 DIAGNOSIS — G8194 Hemiplegia, unspecified affecting left nondominant side: Secondary | ICD-10-CM | POA: Diagnosis not present

## 2020-04-19 DIAGNOSIS — R296 Repeated falls: Secondary | ICD-10-CM | POA: Diagnosis not present

## 2020-04-19 DIAGNOSIS — R262 Difficulty in walking, not elsewhere classified: Secondary | ICD-10-CM | POA: Diagnosis not present

## 2020-04-19 DIAGNOSIS — M6281 Muscle weakness (generalized): Secondary | ICD-10-CM | POA: Diagnosis not present

## 2020-04-19 DIAGNOSIS — E1140 Type 2 diabetes mellitus with diabetic neuropathy, unspecified: Secondary | ICD-10-CM | POA: Diagnosis not present

## 2020-04-25 DIAGNOSIS — I700 Atherosclerosis of aorta: Secondary | ICD-10-CM | POA: Diagnosis not present

## 2020-04-25 DIAGNOSIS — Z981 Arthrodesis status: Secondary | ICD-10-CM | POA: Diagnosis not present

## 2020-04-25 DIAGNOSIS — Z881 Allergy status to other antibiotic agents status: Secondary | ICD-10-CM | POA: Diagnosis not present

## 2020-04-25 DIAGNOSIS — K8020 Calculus of gallbladder without cholecystitis without obstruction: Secondary | ICD-10-CM | POA: Diagnosis not present

## 2020-04-25 DIAGNOSIS — E119 Type 2 diabetes mellitus without complications: Secondary | ICD-10-CM | POA: Diagnosis not present

## 2020-04-25 DIAGNOSIS — Z8673 Personal history of transient ischemic attack (TIA), and cerebral infarction without residual deficits: Secondary | ICD-10-CM | POA: Diagnosis not present

## 2020-04-25 DIAGNOSIS — R296 Repeated falls: Secondary | ICD-10-CM | POA: Diagnosis not present

## 2020-04-25 DIAGNOSIS — M48061 Spinal stenosis, lumbar region without neurogenic claudication: Secondary | ICD-10-CM | POA: Diagnosis not present

## 2020-04-25 DIAGNOSIS — I10 Essential (primary) hypertension: Secondary | ICD-10-CM | POA: Diagnosis not present

## 2020-04-25 DIAGNOSIS — E1140 Type 2 diabetes mellitus with diabetic neuropathy, unspecified: Secondary | ICD-10-CM | POA: Diagnosis not present

## 2020-04-25 DIAGNOSIS — R531 Weakness: Secondary | ICD-10-CM | POA: Diagnosis not present

## 2020-04-25 DIAGNOSIS — G9389 Other specified disorders of brain: Secondary | ICD-10-CM | POA: Diagnosis not present

## 2020-04-25 DIAGNOSIS — M6281 Muscle weakness (generalized): Secondary | ICD-10-CM | POA: Diagnosis not present

## 2020-04-25 DIAGNOSIS — R109 Unspecified abdominal pain: Secondary | ICD-10-CM | POA: Diagnosis not present

## 2020-04-25 DIAGNOSIS — R262 Difficulty in walking, not elsewhere classified: Secondary | ICD-10-CM | POA: Diagnosis not present

## 2020-04-25 DIAGNOSIS — R400 Somnolence: Secondary | ICD-10-CM | POA: Diagnosis not present

## 2020-04-25 DIAGNOSIS — Z9882 Breast implant status: Secondary | ICD-10-CM | POA: Diagnosis not present

## 2020-04-26 DIAGNOSIS — R109 Unspecified abdominal pain: Secondary | ICD-10-CM | POA: Diagnosis not present

## 2020-04-27 DIAGNOSIS — R296 Repeated falls: Secondary | ICD-10-CM | POA: Diagnosis not present

## 2020-04-27 DIAGNOSIS — R262 Difficulty in walking, not elsewhere classified: Secondary | ICD-10-CM | POA: Diagnosis not present

## 2020-04-27 DIAGNOSIS — M6281 Muscle weakness (generalized): Secondary | ICD-10-CM | POA: Diagnosis not present

## 2020-04-27 DIAGNOSIS — E1140 Type 2 diabetes mellitus with diabetic neuropathy, unspecified: Secondary | ICD-10-CM | POA: Diagnosis not present

## 2020-05-02 DIAGNOSIS — R262 Difficulty in walking, not elsewhere classified: Secondary | ICD-10-CM | POA: Diagnosis not present

## 2020-05-02 DIAGNOSIS — E1140 Type 2 diabetes mellitus with diabetic neuropathy, unspecified: Secondary | ICD-10-CM | POA: Diagnosis not present

## 2020-05-02 DIAGNOSIS — R296 Repeated falls: Secondary | ICD-10-CM | POA: Diagnosis not present

## 2020-05-02 DIAGNOSIS — M6281 Muscle weakness (generalized): Secondary | ICD-10-CM | POA: Diagnosis not present

## 2020-05-05 DIAGNOSIS — M6281 Muscle weakness (generalized): Secondary | ICD-10-CM | POA: Diagnosis not present

## 2020-05-05 DIAGNOSIS — R262 Difficulty in walking, not elsewhere classified: Secondary | ICD-10-CM | POA: Diagnosis not present

## 2020-05-05 DIAGNOSIS — R296 Repeated falls: Secondary | ICD-10-CM | POA: Diagnosis not present

## 2020-05-05 DIAGNOSIS — E1140 Type 2 diabetes mellitus with diabetic neuropathy, unspecified: Secondary | ICD-10-CM | POA: Diagnosis not present

## 2020-05-09 DIAGNOSIS — J680 Bronchitis and pneumonitis due to chemicals, gases, fumes and vapors: Secondary | ICD-10-CM | POA: Diagnosis not present

## 2020-05-12 DIAGNOSIS — R55 Syncope and collapse: Secondary | ICD-10-CM | POA: Diagnosis not present

## 2020-05-12 DIAGNOSIS — M6281 Muscle weakness (generalized): Secondary | ICD-10-CM | POA: Diagnosis not present

## 2020-05-22 DIAGNOSIS — E7849 Other hyperlipidemia: Secondary | ICD-10-CM | POA: Diagnosis not present

## 2020-05-22 DIAGNOSIS — I10 Essential (primary) hypertension: Secondary | ICD-10-CM | POA: Diagnosis not present

## 2020-05-22 DIAGNOSIS — E119 Type 2 diabetes mellitus without complications: Secondary | ICD-10-CM | POA: Diagnosis not present

## 2020-06-12 DIAGNOSIS — M6281 Muscle weakness (generalized): Secondary | ICD-10-CM | POA: Diagnosis not present

## 2020-07-13 DIAGNOSIS — M6281 Muscle weakness (generalized): Secondary | ICD-10-CM | POA: Diagnosis not present

## 2020-07-13 DIAGNOSIS — R55 Syncope and collapse: Secondary | ICD-10-CM | POA: Diagnosis not present

## 2020-08-10 DIAGNOSIS — R55 Syncope and collapse: Secondary | ICD-10-CM | POA: Diagnosis not present

## 2020-08-10 DIAGNOSIS — M6281 Muscle weakness (generalized): Secondary | ICD-10-CM | POA: Diagnosis not present

## 2020-08-31 DIAGNOSIS — Z8673 Personal history of transient ischemic attack (TIA), and cerebral infarction without residual deficits: Secondary | ICD-10-CM | POA: Diagnosis not present

## 2020-08-31 DIAGNOSIS — J4540 Moderate persistent asthma, uncomplicated: Secondary | ICD-10-CM | POA: Diagnosis not present

## 2020-08-31 DIAGNOSIS — E1169 Type 2 diabetes mellitus with other specified complication: Secondary | ICD-10-CM | POA: Diagnosis not present

## 2020-08-31 DIAGNOSIS — I10 Essential (primary) hypertension: Secondary | ICD-10-CM | POA: Diagnosis not present

## 2020-09-10 DIAGNOSIS — M6281 Muscle weakness (generalized): Secondary | ICD-10-CM | POA: Diagnosis not present

## 2020-09-10 DIAGNOSIS — R55 Syncope and collapse: Secondary | ICD-10-CM | POA: Diagnosis not present

## 2020-10-03 ENCOUNTER — Encounter (HOSPITAL_COMMUNITY): Payer: Self-pay

## 2020-10-03 ENCOUNTER — Emergency Department (HOSPITAL_COMMUNITY): Payer: Medicare Other

## 2020-10-03 ENCOUNTER — Other Ambulatory Visit: Payer: Self-pay

## 2020-10-03 ENCOUNTER — Emergency Department (HOSPITAL_COMMUNITY)
Admission: EM | Admit: 2020-10-03 | Discharge: 2020-10-03 | Disposition: A | Discharge status: Home / Self Care | Payer: Medicare Other | Attending: Emergency Medicine | Admitting: Emergency Medicine

## 2020-10-03 DIAGNOSIS — W01198A Fall on same level from slipping, tripping and stumbling with subsequent striking against other object, initial encounter: Secondary | ICD-10-CM | POA: Insufficient documentation

## 2020-10-03 DIAGNOSIS — Z79899 Other long term (current) drug therapy: Secondary | ICD-10-CM | POA: Insufficient documentation

## 2020-10-03 DIAGNOSIS — Z7902 Long term (current) use of antithrombotics/antiplatelets: Secondary | ICD-10-CM | POA: Diagnosis not present

## 2020-10-03 DIAGNOSIS — W19XXXA Unspecified fall, initial encounter: Secondary | ICD-10-CM | POA: Diagnosis not present

## 2020-10-03 DIAGNOSIS — I10 Essential (primary) hypertension: Secondary | ICD-10-CM | POA: Diagnosis not present

## 2020-10-03 DIAGNOSIS — Z743 Need for continuous supervision: Secondary | ICD-10-CM | POA: Diagnosis not present

## 2020-10-03 DIAGNOSIS — Z96651 Presence of right artificial knee joint: Secondary | ICD-10-CM | POA: Diagnosis not present

## 2020-10-03 DIAGNOSIS — E1169 Type 2 diabetes mellitus with other specified complication: Secondary | ICD-10-CM | POA: Insufficient documentation

## 2020-10-03 DIAGNOSIS — E785 Hyperlipidemia, unspecified: Secondary | ICD-10-CM | POA: Diagnosis not present

## 2020-10-03 DIAGNOSIS — J449 Chronic obstructive pulmonary disease, unspecified: Secondary | ICD-10-CM | POA: Diagnosis not present

## 2020-10-03 DIAGNOSIS — Z85828 Personal history of other malignant neoplasm of skin: Secondary | ICD-10-CM | POA: Insufficient documentation

## 2020-10-03 DIAGNOSIS — S0990XA Unspecified injury of head, initial encounter: Secondary | ICD-10-CM | POA: Insufficient documentation

## 2020-10-03 DIAGNOSIS — Z7984 Long term (current) use of oral hypoglycemic drugs: Secondary | ICD-10-CM | POA: Diagnosis not present

## 2020-10-03 DIAGNOSIS — G319 Degenerative disease of nervous system, unspecified: Secondary | ICD-10-CM | POA: Diagnosis not present

## 2020-10-03 DIAGNOSIS — M47812 Spondylosis without myelopathy or radiculopathy, cervical region: Secondary | ICD-10-CM | POA: Diagnosis not present

## 2020-10-03 DIAGNOSIS — Z7982 Long term (current) use of aspirin: Secondary | ICD-10-CM | POA: Diagnosis not present

## 2020-10-03 DIAGNOSIS — Z8673 Personal history of transient ischemic attack (TIA), and cerebral infarction without residual deficits: Secondary | ICD-10-CM | POA: Diagnosis not present

## 2020-10-03 DIAGNOSIS — M7989 Other specified soft tissue disorders: Secondary | ICD-10-CM | POA: Diagnosis not present

## 2020-10-03 DIAGNOSIS — S199XXA Unspecified injury of neck, initial encounter: Secondary | ICD-10-CM | POA: Diagnosis not present

## 2020-10-03 HISTORY — DX: Cerebral infarction, unspecified: I63.9

## 2020-10-03 NOTE — ED Provider Notes (Signed)
[facility] EMERGENCY DEPARTMENT Provider Note   CSN: [id number] Arrival date & time: 10/03/20  1505     History Chief Complaint  Patient presents with  . Fall    [name] is a 80 y.o. female.  Patient fell and hit her head.  No loss consciousness.  Patient complains of pain to the back of her head  The history is provided by the patient and medical records. No language interpreter was used.  Fall This is a new problem. The current episode started 6 to 12 hours ago. The problem occurs rarely. The problem has not changed since onset.Pertinent negatives include no chest pain, no abdominal pain and no headaches. Nothing aggravates the symptoms. Nothing relieves the symptoms. She has tried nothing for the symptoms. The treatment provided no relief.       Past Medical History:  Diagnosis Date  . Aortic stenosis   . Carpal tunnel syndrome, right   . COPD (chronic obstructive pulmonary disease) ([place])   . DJD (degenerative joint disease)   . Eczematous dermatitis   . Essential hypertension   . Gallstones   . Hyperlipidemia   . Osteoarthritis   . Rotator cuff syndrome   . Spinal stenosis of lumbar region   . Squamous cell carcinoma   . Stroke ([place])   . Type 2 diabetes mellitus [facility])     Patient Active Problem List   Diagnosis Date Noted  . Syncope 05/20/2019  . TIA (transient ischemic attack) 11/04/2012  . Postop Transfusion history 10/03/2011  . Septic arthritis of knee, right ([place]) 10/02/2011  . Acute blood loss anemia 04/22/2011  . CARCINOMA, SQUAMOUS CELL 09/26/2008  . DYSLIPIDEMIA 09/26/2008  . HYPERTENSION 09/26/2008  . DEGENERATIVE JOINT DISEASE, GENERALIZED 09/26/2008  . OSTEOARTHRITIS 09/26/2008  . ROTATOR CUFF SYNDROME 09/26/2008  . DIABETES MELLITUS, BORDERLINE 09/26/2008  . DYSPNEA 09/23/2008    Past Surgical History:  Procedure Laterality Date  . BACK SURGERY    . CATARACT EXTRACTION  2011   bilateral  . JOINT REPLACEMENT  09-27-11   '04/  right with resection for infection, now antibiotic spacer planned  . JOINT REPLACEMENT     not replaced- only surgery 2007/2011/2012.  . LUMBAR LAMINECTOMY/DECOMPRESSION MICRODISCECTOMY  04/19/2011   Procedure: LUMBAR LAMINECTOMY/DECOMPRESSION MICRODISCECTOMY;  Surgeon: Tobi Bastos;  Location: [facility] ORS;  Service: Orthopedics;  Laterality: N/A;  Central decompression Lumbar two to lumbar three and Three to Lumbar Four    (xray)  . TOTAL KNEE REVISION  12/04/2011   Procedure: TOTAL KNEE REVISION;  Surgeon: Gearlean Alf, MD;  Location: [facility] ORS;  Service: Orthopedics;  Laterality: Right;  Reimplantation of a Right Total Knee Arthroplasty  . TUBAL LIGATION       OB History   No obstetric history on file.     Family History  Problem Relation Age of Onset  . Heart attack Mother   . Cancer Mother        cholangiocarcinoma  . Heart disease Father   . Heart attack Brother   . Heart attack Brother   . Heart attack Brother   . Coronary artery disease Other   . Diabetes Other     Social History   Tobacco Use  . Smoking status: Never Smoker  . Smokeless tobacco: Never Used  Substance Use Topics  . Alcohol use: No  . Drug use: No    Home Medications Prior to Admission medications   Medication Sig Start Date End Date Taking? Authorizing Provider  albuterol (  VENTOLIN HFA) 108 (90 Base) MCG/ACT inhaler Inhale 1 puff into the lungs every 4 (four) hours as needed for shortness of breath. 08/31/20  Yes [provider]  benazepril-hydrochlorthiazide (LOTENSIN HCT) 20-25 MG tablet Hold this medication until you follow up with PCP because your blood pressure was too low. 05/21/19  Yes Enzo Bi, MD  clopidogrel (PLAVIX) 75 MG tablet Take 75 mg by mouth daily. 10/19/12  Yes [provider]  fish oil-omega-3 fatty acids 1000 MG capsule Take 2 g by mouth daily.   Yes [provider]  gabapentin (NEURONTIN) 100 MG capsule Take 100 mg by mouth 2 (two) times daily. 07/05/20   Yes [provider]  labetalol (NORMODYNE) 300 MG tablet Take 300 mg by mouth 2 (two) times daily.   Yes [provider]  metFORMIN (GLUCOPHAGE) 500 MG tablet Take 1 tablet by mouth 2 (two) times daily. 11/28/16  Yes [provider]  Multiple Vitamins-Minerals (VITAMIN D3 COMPLETE PO) Take 1 tablet by mouth daily.   Yes [provider]  pravastatin (PRAVACHOL) 40 MG tablet Take 40 mg by mouth daily. 08/02/19  Yes [provider]  vitamin B-12 (CYANOCOBALAMIN) 100 MCG tablet Take 100 mcg by mouth daily.   Yes [provider]  vitamin C (ASCORBIC ACID) 500 MG tablet Take 500 mg by mouth daily.   Yes [provider]  VITAMIN E PO Take 1 tablet by mouth daily.   Yes [provider]  aspirin EC 81 MG tablet Take 81 mg by mouth daily. Patient not taking: Reported on 10/03/2020    [provider]  Ferrous Sulfate (IRON) 325 (65 FE) MG TABS Take 65 mg by mouth. Occasionally Patient not taking: Reported on 10/03/2020    [provider]  gabapentin (NEURONTIN) 300 MG capsule Take 300 mg by mouth 3 (three) times daily. Patient not taking: Reported on 10/03/2020 05/10/19   [provider]  hydrochlorothiazide (HYDRODIURIL) 25 MG tablet Take by mouth. Patient not taking: Reported on 10/03/2020 09/15/19   [provider]  lisinopril (ZESTRIL) 20 MG tablet Take by mouth. Patient not taking: Reported on 10/03/2020 09/14/19   [provider]  Multiple Vitamin (MULTIVITAMIN) tablet Take 1 tablet by mouth daily. Patient not taking: Reported on 10/03/2020    [provider]  NIFEdipine (ADALAT CC) 30 MG 24 hr tablet Take by mouth. Patient not taking: Reported on 10/03/2020 09/15/19   [provider]  tiZANidine (ZANAFLEX) 2 MG tablet Take by mouth. Patient not taking: Reported on 10/03/2020    [provider]    Allergies    Ciprofloxacin  Review of Systems   Review of Systems   Constitutional: Negative for appetite change and fatigue.  HENT: Negative for congestion, ear discharge and sinus pressure.        Headache  Eyes: Negative for discharge.  Respiratory: Negative for cough.   Cardiovascular: Negative for chest pain.  Gastrointestinal: Negative for abdominal pain and diarrhea.  Genitourinary: Negative for frequency and hematuria.  Musculoskeletal: Negative for back pain.  Skin: Negative for rash.  Neurological: Negative for seizures and headaches.  Psychiatric/Behavioral: Negative for hallucinations.    Physical Exam Updated Vital Signs BP 123/86   Pulse 73   Temp 98.3 F (36.8 C) (Oral)   Resp 20   Ht 5' 5.5" (1.664 m)   Wt 88.5 kg   SpO2 94%   BMI 31.96 kg/m   Physical Exam Vitals reviewed.  Constitutional:      Appearance:  She is well-developed.  HENT:     Head: Normocephalic.     Comments: Mild tenderness posterior head    Nose: Nose normal.  Eyes:     General: No scleral icterus.    Conjunctiva/sclera: Conjunctivae normal.  Neck:     Thyroid: No thyromegaly.  Cardiovascular:     Rate and Rhythm: Normal rate and regular rhythm.     Heart sounds: No murmur heard. No friction rub. No gallop.   Pulmonary:     Breath sounds: No stridor. No wheezing or rales.  Chest:     Chest wall: No tenderness.  Abdominal:     General: There is no distension.     Tenderness: There is no abdominal tenderness. There is no rebound.  Musculoskeletal:        General: Normal range of motion.     Cervical back: Neck supple.  Lymphadenopathy:     Cervical: No cervical adenopathy.  Skin:    Findings: No erythema or rash.  Neurological:     Mental Status: She is oriented to person, place, and time.     Motor: No abnormal muscle tone.     Coordination: Coordination normal.  Psychiatric:        Behavior: Behavior normal.     ED Results / Procedures / Treatments   Labs (all labs ordered are listed, but only abnormal results are  displayed) Labs Reviewed - No data to display  EKG None  Radiology CT Head Wo Contrast  Result Date: 10/03/2020 CLINICAL DATA:  Head trauma, minor. Neck trauma. Additional provided: Fall (hitting back of head) today. Patient reports history of stroke 2 years ago affecting left side. EXAM: CT HEAD WITHOUT CONTRAST CT CERVICAL SPINE WITHOUT CONTRAST TECHNIQUE: Multidetector CT imaging of the head and cervical spine was performed following the standard protocol without intravenous contrast. Multiplanar CT image reconstructions of the cervical spine were also generated. COMPARISON:  Head CT 05/19/2019. Brain MRI 01/11/2020. Report from radiographs of the cervical spine 01/02/2018 (images unavailable). FINDINGS: CT HEAD FINDINGS Brain: Mild cerebral atrophy. Commensurate prominence of the ventricles and sulci. Severe patchy and ill-defined hypoattenuation within the cerebral white matter, nonspecific but compatible with chronic small vessel ischemic disease. There is no acute intracranial hemorrhage. No demarcated cortical infarct. No extra-axial fluid collection. No evidence of intracranial mass. No midline shift. Vascular: No hyperdense vessel.  Atherosclerotic calcifications Skull: Normal. Negative for fracture or focal lesion. Sinuses/Orbits: Visualized orbits show no acute finding. No significant paranasal sinus disease at the imaged levels. Other: Parietooccipital scalp soft tissue swelling. CT CERVICAL SPINE FINDINGS Alignment: Straightening of the expected cervical lordosis. 4 mm C3-C4 grade 1 anterolisthesis. 3 mm C6-C7 grade 1 anterolisthesis. 5 mm T1-T2 grade 1 anterolisthesis. Trace T2-T3 grade 1 anterolisthesis. Skull base and vertebrae: The basion-dental and atlanto-dental intervals are maintained.No evidence of acute fracture to the cervical spine. Soft tissues and spinal canal: No prevertebral fluid or swelling. No visible canal hematoma. Disc levels: Cervical spondylosis with multilevel disc  space narrowing, disc bulges vertebral osteophytosis, uncovertebral hypertrophy and facet arthrosis. No appreciable high-grade spinal canal stenosis. Multilevel bony neural foraminal narrowing. ACDF hardware at C3-C4 and C5-C6. No evidence of hardware compromise. Apparent fusion across the C4-C5 disc space. Additionally, there is right C4-C5 facet joint ankylosis. Advanced disc space narrowing and possible osseous fusion across the C6-C7 disc space. Also of note, disc space narrowing is severe at C7-T1. Upper chest: No consolidation within the imaged lung apices. No visible pneumothorax. IMPRESSION: CT head: 1.  No evidence of acute intracranial abnormality. 2. Parietooccipital scalp soft tissue swelling. 3. Redemonstrated severe cerebral white matter chronic small vessel ischemic disease and mild cerebral atrophy. CT cervical spine: 1. No evidence of acute fracture to the cervical spine. 2. Straightening of the expected cervical lordosis. 3. C3-C4, C6-C7, T1-T2 and T2-T3 grade 1 anterolisthesis. 4. Cervical spondylosis and multilevel fusion as described. ACDF hardware is present at C3-C4 and C6-C7. No evidence of hardware compromise. Electronically Signed   By: Kellie Simmering DO   On: 10/03/2020 16:16   CT Cervical Spine Wo Contrast  Result Date: 10/03/2020 CLINICAL DATA:  Head trauma, minor. Neck trauma. Additional provided: Fall (hitting back of head) today. Patient reports history of stroke 2 years ago affecting left side. EXAM: CT HEAD WITHOUT CONTRAST CT CERVICAL SPINE WITHOUT CONTRAST TECHNIQUE: Multidetector CT imaging of the head and cervical spine was performed following the standard protocol without intravenous contrast. Multiplanar CT image reconstructions of the cervical spine were also generated. COMPARISON:  Head CT 05/19/2019. Brain MRI 01/11/2020. Report from radiographs of the cervical spine 01/02/2018 (images unavailable). FINDINGS: CT HEAD FINDINGS Brain: Mild cerebral atrophy. Commensurate  prominence of the ventricles and sulci. Severe patchy and ill-defined hypoattenuation within the cerebral white matter, nonspecific but compatible with chronic small vessel ischemic disease. There is no acute intracranial hemorrhage. No demarcated cortical infarct. No extra-axial fluid collection. No evidence of intracranial mass. No midline shift. Vascular: No hyperdense vessel.  Atherosclerotic calcifications Skull: Normal. Negative for fracture or focal lesion. Sinuses/Orbits: Visualized orbits show no acute finding. No significant paranasal sinus disease at the imaged levels. Other: Parietooccipital scalp soft tissue swelling. CT CERVICAL SPINE FINDINGS Alignment: Straightening of the expected cervical lordosis. 4 mm C3-C4 grade 1 anterolisthesis. 3 mm C6-C7 grade 1 anterolisthesis. 5 mm T1-T2 grade 1 anterolisthesis. Trace T2-T3 grade 1 anterolisthesis. Skull base and vertebrae: The basion-dental and atlanto-dental intervals are maintained.No evidence of acute fracture to the cervical spine. Soft tissues and spinal canal: No prevertebral fluid or swelling. No visible canal hematoma. Disc levels: Cervical spondylosis with multilevel disc space narrowing, disc bulges vertebral osteophytosis, uncovertebral hypertrophy and facet arthrosis. No appreciable high-grade spinal canal stenosis. Multilevel bony neural foraminal narrowing. ACDF hardware at C3-C4 and C5-C6. No evidence of hardware compromise. Apparent fusion across the C4-C5 disc space. Additionally, there is right C4-C5 facet joint ankylosis. Advanced disc space narrowing and possible osseous fusion across the C6-C7 disc space. Also of note, disc space narrowing is severe at C7-T1. Upper chest: No consolidation within the imaged lung apices. No visible pneumothorax. IMPRESSION: CT head: 1. No evidence of acute intracranial abnormality. 2. Parietooccipital scalp soft tissue swelling. 3. Redemonstrated severe cerebral white matter chronic small vessel  ischemic disease and mild cerebral atrophy. CT cervical spine: 1. No evidence of acute fracture to the cervical spine. 2. Straightening of the expected cervical lordosis. 3. C3-C4, C6-C7, T1-T2 and T2-T3 grade 1 anterolisthesis. 4. Cervical spondylosis and multilevel fusion as described. ACDF hardware is present at C3-C4 and C6-C7. No evidence of hardware compromise. Electronically Signed   By: Kellie Simmering DO   On: 10/03/2020 16:16    Procedures Procedures   Medications Ordered in ED Medications - No data to display  ED Course  I have reviewed the triage vital signs and the nursing notes.  Pertinent labs & imaging results that were available during my care of the patient were reviewed by me and considered in my medical decision making (see chart for details). T scan negative for  acute disease   MDM Rules/Calculators/A&P                          Fall with contusion to head.  Patient will be discharged home Final Clinical Impression(s) / ED Diagnoses Final diagnoses:  Injury of head, initial encounter    Rx / DC Orders ED Discharge Orders    None       Milton Ferguson, MD 10/03/20 [phone]

## 2020-10-03 NOTE — ED Triage Notes (Signed)
Pt presents to ED from home for fall. Pt hit back of her head, denies LOC. Pt with history of left sided weakness from previous stroke and fell and hit the back of her head on fireplace due to unsteadiness on her feet.

## 2020-10-03 NOTE — ED Notes (Signed)
Patient assisted on bedpan at this time.

## 2020-10-03 NOTE — Discharge Instructions (Signed)
Take Tylenol for pain and follow-up with your doctor if any problem ?

## 2020-10-03 NOTE — ED Notes (Signed)
Dr Zammit at bedside prior to triage complete for MSE.  

## 2020-10-29 IMAGING — DX DG CHEST 2V
2 series · 2 of 2 positions shown · non-contrast
Comparison: 10/12/2018

CLINICAL DATA: Rule out detection

EXAM:
CHEST - 2 VIEW

[chest pa]
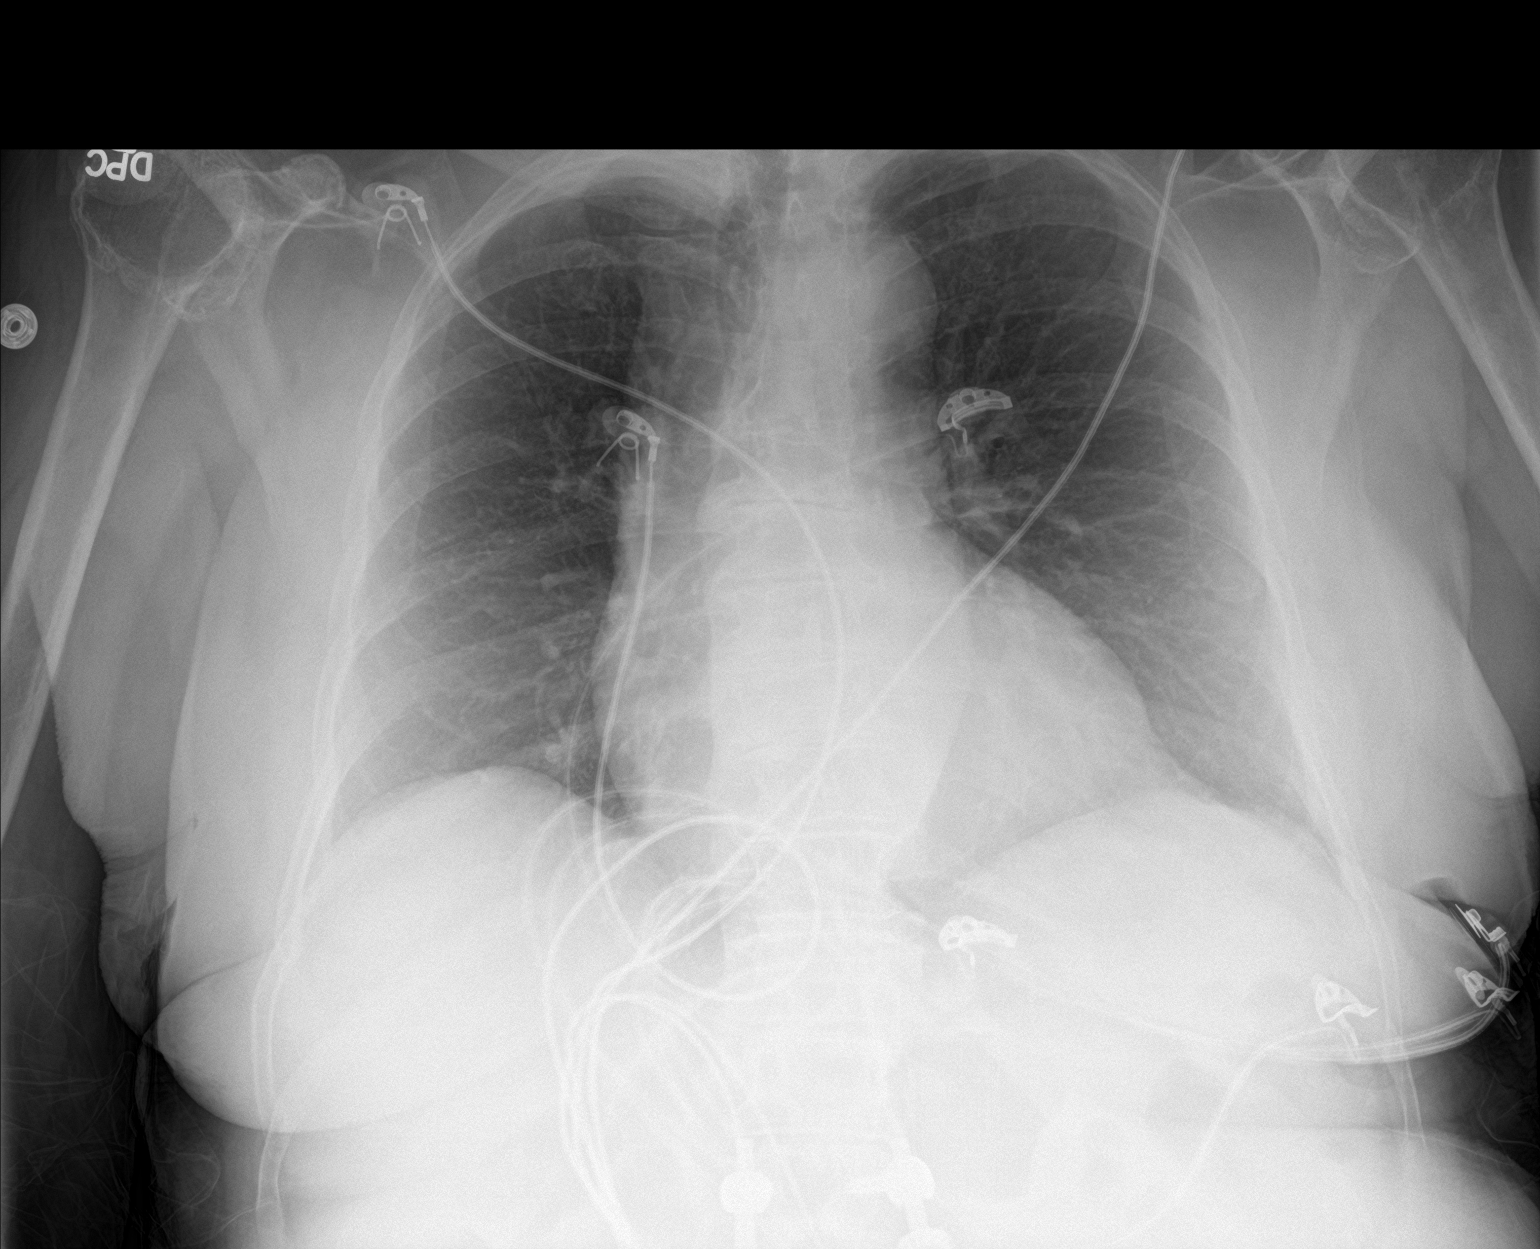

[chest lat]
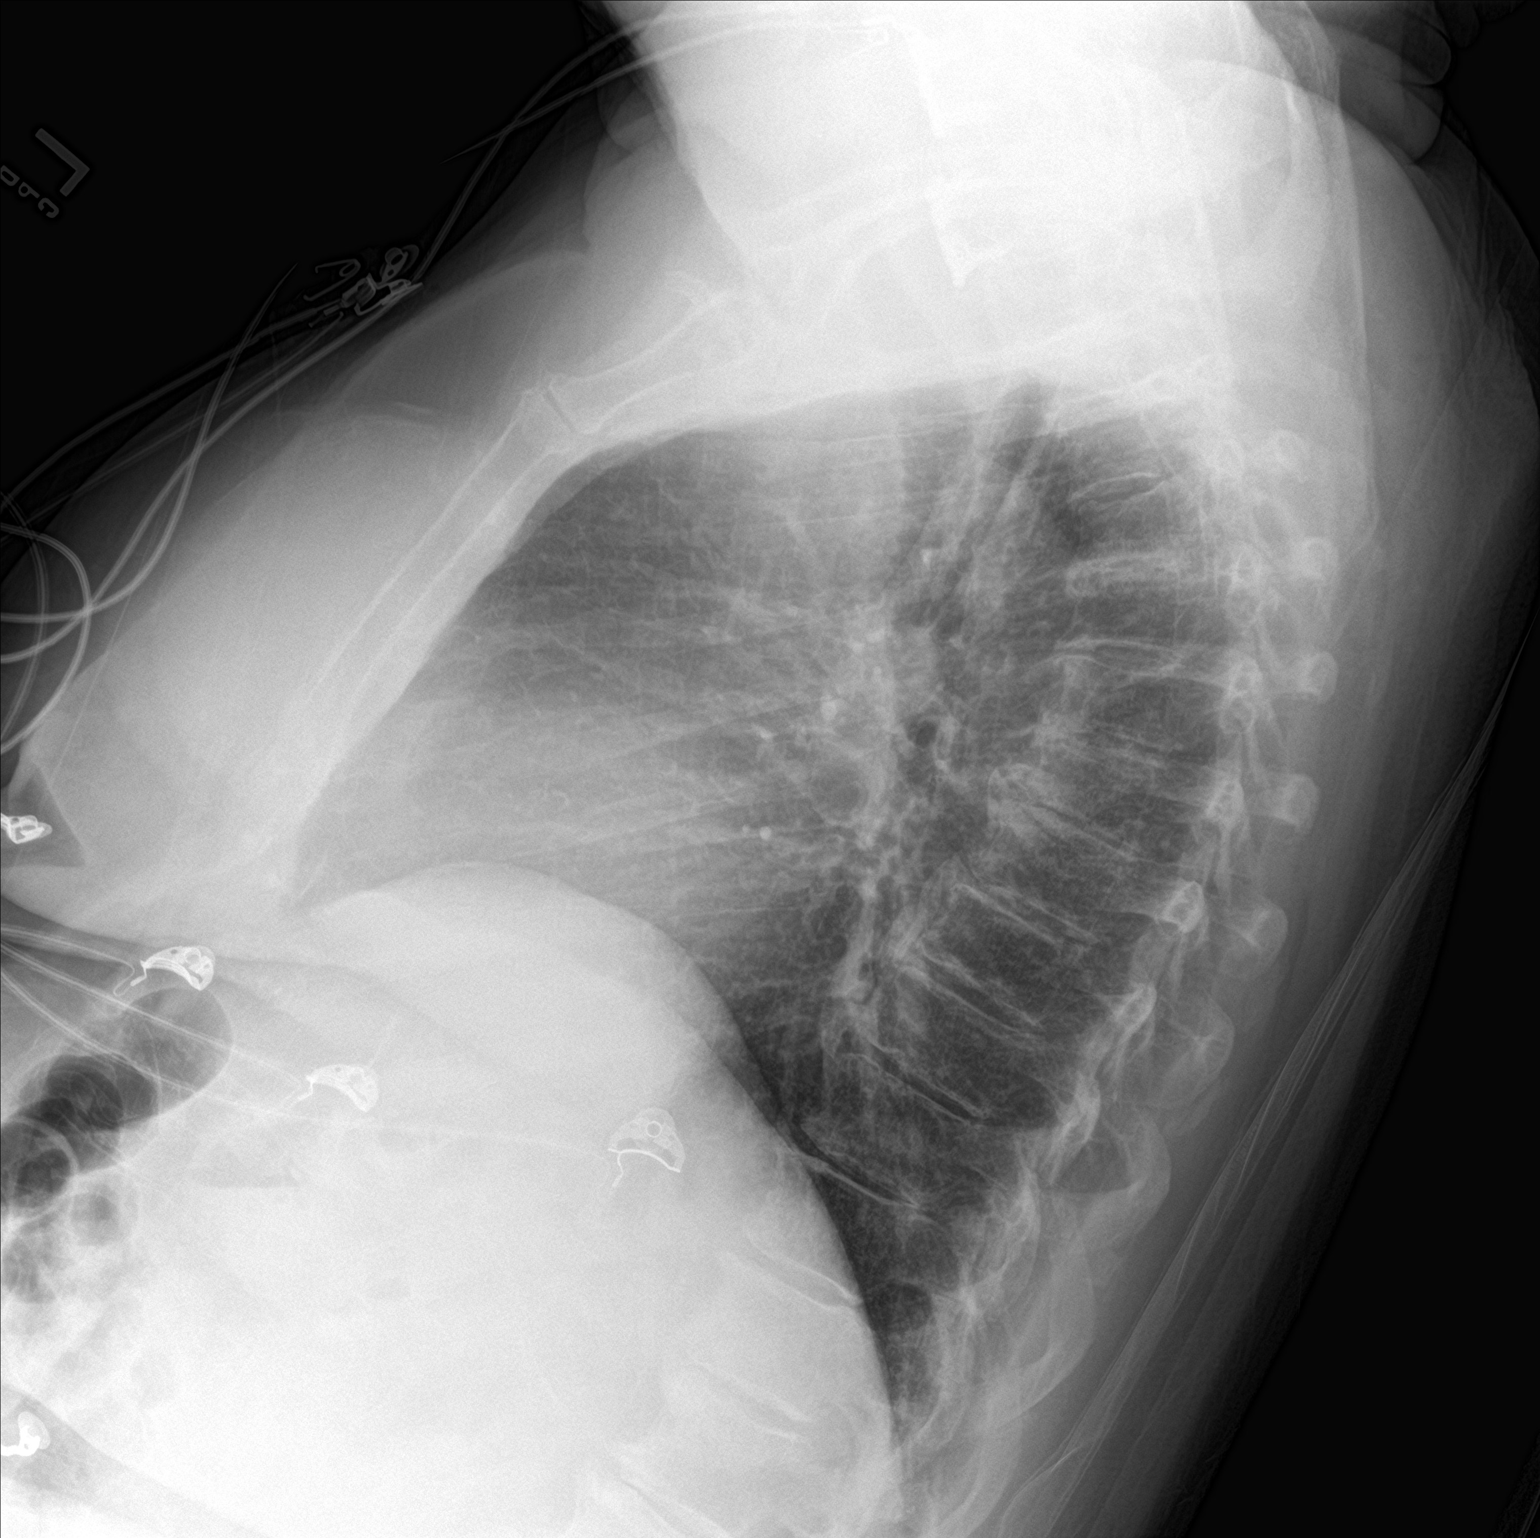

[2 of 2 positions shown; findings below may reference images not displayed]

FINDINGS: The heart size and mediastinal contours are within normal limits.
Both lungs are clear. The visualized skeletal structures are
unremarkable.
IMPRESSION: No active cardiopulmonary disease.

## 2020-11-23 DIAGNOSIS — Z8673 Personal history of transient ischemic attack (TIA), and cerebral infarction without residual deficits: Secondary | ICD-10-CM | POA: Diagnosis not present

## 2020-11-23 DIAGNOSIS — M1710 Unilateral primary osteoarthritis, unspecified knee: Secondary | ICD-10-CM | POA: Diagnosis not present

## 2021-01-01 DIAGNOSIS — I10 Essential (primary) hypertension: Secondary | ICD-10-CM | POA: Diagnosis not present

## 2021-01-01 DIAGNOSIS — Z96652 Presence of left artificial knee joint: Secondary | ICD-10-CM | POA: Diagnosis not present

## 2021-01-01 DIAGNOSIS — N390 Urinary tract infection, site not specified: Secondary | ICD-10-CM | POA: Diagnosis not present

## 2021-01-01 DIAGNOSIS — R531 Weakness: Secondary | ICD-10-CM | POA: Diagnosis not present

## 2021-01-01 DIAGNOSIS — L03317 Cellulitis of buttock: Secondary | ICD-10-CM | POA: Diagnosis not present

## 2021-01-01 DIAGNOSIS — Z8673 Personal history of transient ischemic attack (TIA), and cerebral infarction without residual deficits: Secondary | ICD-10-CM | POA: Diagnosis not present

## 2021-01-01 DIAGNOSIS — J449 Chronic obstructive pulmonary disease, unspecified: Secondary | ICD-10-CM | POA: Diagnosis not present

## 2021-01-01 DIAGNOSIS — E119 Type 2 diabetes mellitus without complications: Secondary | ICD-10-CM | POA: Diagnosis not present

## 2021-01-01 DIAGNOSIS — I440 Atrioventricular block, first degree: Secondary | ICD-10-CM | POA: Diagnosis not present

## 2021-01-01 DIAGNOSIS — R296 Repeated falls: Secondary | ICD-10-CM | POA: Diagnosis not present

## 2021-01-01 DIAGNOSIS — Z743 Need for continuous supervision: Secondary | ICD-10-CM | POA: Diagnosis not present

## 2021-01-01 DIAGNOSIS — G319 Degenerative disease of nervous system, unspecified: Secondary | ICD-10-CM | POA: Diagnosis not present

## 2021-01-04 DIAGNOSIS — L0291 Cutaneous abscess, unspecified: Secondary | ICD-10-CM | POA: Diagnosis not present

## 2021-02-27 DIAGNOSIS — M25551 Pain in right hip: Secondary | ICD-10-CM | POA: Diagnosis not present

## 2021-02-27 DIAGNOSIS — R296 Repeated falls: Secondary | ICD-10-CM | POA: Diagnosis not present

## 2021-02-28 DIAGNOSIS — M461 Sacroiliitis, not elsewhere classified: Secondary | ICD-10-CM | POA: Diagnosis not present

## 2021-03-14 ENCOUNTER — Encounter (HOSPITAL_COMMUNITY): Payer: Self-pay

## 2021-03-14 ENCOUNTER — Emergency Department (HOSPITAL_COMMUNITY): Payer: Medicare Other

## 2021-03-14 ENCOUNTER — Other Ambulatory Visit: Payer: Self-pay

## 2021-03-14 ENCOUNTER — Emergency Department (HOSPITAL_COMMUNITY)
Admission: EM | Admit: 2021-03-14 | Discharge: 2021-03-14 | Disposition: A | Discharge status: Home / Self Care | Payer: Medicare Other | Attending: Emergency Medicine | Admitting: Emergency Medicine

## 2021-03-14 DIAGNOSIS — Z7984 Long term (current) use of oral hypoglycemic drugs: Secondary | ICD-10-CM | POA: Diagnosis not present

## 2021-03-14 DIAGNOSIS — I10 Essential (primary) hypertension: Secondary | ICD-10-CM | POA: Diagnosis not present

## 2021-03-14 DIAGNOSIS — I629 Nontraumatic intracranial hemorrhage, unspecified: Secondary | ICD-10-CM | POA: Insufficient documentation

## 2021-03-14 DIAGNOSIS — R262 Difficulty in walking, not elsewhere classified: Secondary | ICD-10-CM | POA: Diagnosis not present

## 2021-03-14 DIAGNOSIS — J449 Chronic obstructive pulmonary disease, unspecified: Secondary | ICD-10-CM | POA: Insufficient documentation

## 2021-03-14 DIAGNOSIS — R4182 Altered mental status, unspecified: Secondary | ICD-10-CM | POA: Diagnosis not present

## 2021-03-14 DIAGNOSIS — G319 Degenerative disease of nervous system, unspecified: Secondary | ICD-10-CM | POA: Diagnosis not present

## 2021-03-14 DIAGNOSIS — Z7982 Long term (current) use of aspirin: Secondary | ICD-10-CM | POA: Diagnosis not present

## 2021-03-14 DIAGNOSIS — I639 Cerebral infarction, unspecified: Secondary | ICD-10-CM | POA: Diagnosis not present

## 2021-03-14 DIAGNOSIS — Z96641 Presence of right artificial hip joint: Secondary | ICD-10-CM | POA: Diagnosis not present

## 2021-03-14 DIAGNOSIS — Z20822 Contact with and (suspected) exposure to covid-19: Secondary | ICD-10-CM | POA: Insufficient documentation

## 2021-03-14 DIAGNOSIS — Z79899 Other long term (current) drug therapy: Secondary | ICD-10-CM | POA: Diagnosis not present

## 2021-03-14 DIAGNOSIS — E119 Type 2 diabetes mellitus without complications: Secondary | ICD-10-CM | POA: Diagnosis not present

## 2021-03-14 DIAGNOSIS — R531 Weakness: Secondary | ICD-10-CM | POA: Insufficient documentation

## 2021-03-14 LAB — RESP PANEL BY RT-PCR (FLU A&B, COVID) ARPGX2
Influenza A by PCR: NEGATIVE
Influenza B by PCR: NEGATIVE
SARS Coronavirus 2 by RT PCR: NEGATIVE

## 2021-03-14 LAB — CBC
HCT: 39.3 % (ref 36.0–46.0)
Hemoglobin: 12.3 g/dL (ref 12.0–15.0)
MCH: 27.4 pg (ref 26.0–34.0)
MCHC: 31.3 g/dL (ref 30.0–36.0)
MCV: 87.5 fL (ref 80.0–100.0)
Platelets: 224 10*3/uL (ref 150–400)
RBC: 4.49 MIL/uL (ref 3.87–5.11)
RDW: 16.9 % — ABNORMAL HIGH (ref 11.5–15.5)
WBC: 7.5 10*3/uL (ref 4.0–10.5)
nRBC: 0 % (ref 0.0–0.2)

## 2021-03-14 LAB — URINALYSIS, ROUTINE W REFLEX MICROSCOPIC
Bilirubin Urine: NEGATIVE
Glucose, UA: NEGATIVE mg/dL
Hgb urine dipstick: NEGATIVE
Ketones, ur: 5 mg/dL — AB
Leukocytes,Ua: NEGATIVE
Nitrite: NEGATIVE
Protein, ur: NEGATIVE mg/dL
Specific Gravity, Urine: 1.018 (ref 1.005–1.030)
pH: 5 (ref 5.0–8.0)

## 2021-03-14 LAB — COMPREHENSIVE METABOLIC PANEL
ALT: 15 U/L (ref 0–44)
AST: 16 U/L (ref 15–41)
Albumin: 4.2 g/dL (ref 3.5–5.0)
Alkaline Phosphatase: 82 U/L (ref 38–126)
Anion gap: 9 (ref 5–15)
BUN: 19 mg/dL (ref 8–23)
CO2: 28 mmol/L (ref 22–32)
Calcium: 9.1 mg/dL (ref 8.9–10.3)
Chloride: 102 mmol/L (ref 98–111)
Creatinine, Ser: 0.67 mg/dL (ref 0.44–1.00)
GFR, Estimated: >60 mL/min (ref >60)
Glucose, Bld: 129 mg/dL — ABNORMAL HIGH (ref 70–99)
Potassium: 3.7 mmol/L (ref 3.5–5.1)
Sodium: 139 mmol/L (ref 135–145)
Total Bilirubin: 0.7 mg/dL (ref 0.3–1.2)
Total Protein: 6.9 g/dL (ref 6.5–8.1)

## 2021-03-14 LAB — CBG MONITORING, ED: Glucose-Capillary: 136 mg/dL — ABNORMAL HIGH (ref 70–99)

## 2021-03-14 NOTE — ED Notes (Signed)
Pt to CT

## 2021-03-14 NOTE — ED Notes (Signed)
Daughter states that pt has been progressively getting weaker over the past few weeks after having a fall around 5-6 weeks ago and hurting right hip. She states that she had a stroke last year and was able to use her walker to get around with some left sided weakness at baseline. However, over the 24-48 hours pt has not been able to use walker due to weakness and noticeable "drop" in right foot per daughter. Daughter states pt has been more confused than usual and talking about dead family members. Daughter is also concerned stating over the past week pt's color has looked "pale and dusky"

## 2021-03-14 NOTE — ED Notes (Signed)
EDP at bedside  

## 2021-03-14 NOTE — ED Triage Notes (Signed)
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 ~last 24 hours daughter states she is unable to stand at all, states pt is leaning to the left and is extremely weak. Daughter states hallucinations of mother seeing deceased mother. States speech is at baseline.

## 2021-03-14 NOTE — ED Provider Notes (Signed)
Patient's MRI without any acute findings.  Urinalysis normal.  Patient stable for discharge home.  They will contact their primary care doctor may be some consideration for some home health needs.  No indication for admission.   Fredia Sorrow, MD 03/14/21 [phone]

## 2021-03-14 NOTE — Discharge Instructions (Addendum)
Work-up here today with no explanation for the generalized weakness.  MRI was negative.  Labs without any significant abnormalities.  Would follow-up with primary care doctor.  May be some consideration for some home health needs.  Return for any new or worse symptoms.

## 2021-03-14 NOTE — ED Provider Notes (Signed)
[facility] EMERGENCY DEPARTMENT Provider Note   CSN: [id number] Arrival date & time: 03/14/21  1304     History Chief Complaint  Patient presents with   Altered Mental Status    [name] is a 80 y.o. female.  Pt c/o generalized weakness for the past several weeks - symptoms gradual onset, moderate, persistent, slowly worse. Daughter indicates in past couple days has seemed much worse in terms of severe, generalized weakness, less able to assist with walking, no energy, and that her color is poor.  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 Pt denies headache. No chest pain or discomfort. No sob or unusual doe. No cough or uri symptoms. No abd pain or nvd. Pt indicates is eating and drinking normally, denies wt loss. No recent trauma/fall. No syncope.  ~ 1 week ago was placed on abx for possible uti, and started on an antidepressant med. Pt indicates feels down at times, no hx severe depression.   The history is provided by the patient, a relative and medical records.  Altered Mental Status Associated symptoms: weakness   Associated symptoms: no abdominal pain, no agitation, no fever, no headaches, no rash and no vomiting       Past Medical History:  Diagnosis Date   Aortic stenosis    Carpal tunnel syndrome, right    COPD (chronic obstructive pulmonary disease) (HCC)    DJD (degenerative joint disease)    Eczematous dermatitis    Essential hypertension    Gallstones    Hyperlipidemia    Osteoarthritis    Rotator cuff syndrome    Spinal stenosis of lumbar region    Squamous cell carcinoma    Stroke ([place])    Type 2 diabetes mellitus ([place])     Patient Active Problem List   Diagnosis Date Noted   Syncope 05/20/2019   TIA (transient ischemic attack) 11/04/2012   Postop Transfusion history 10/03/2011   Septic arthritis of knee, right ([place])  10/02/2011   Acute blood loss anemia 04/22/2011   CARCINOMA, SQUAMOUS CELL 09/26/2008   DYSLIPIDEMIA 09/26/2008   HYPERTENSION 09/26/2008   DEGENERATIVE JOINT DISEASE, GENERALIZED 09/26/2008   OSTEOARTHRITIS 09/26/2008   ROTATOR CUFF SYNDROME 09/26/2008   DIABETES MELLITUS, BORDERLINE 09/26/2008   DYSPNEA 09/23/2008    Past Surgical History:  Procedure Laterality Date   BACK SURGERY     CATARACT EXTRACTION  2011   bilateral   JOINT REPLACEMENT  09-27-11   '04/ right with resection for infection, now antibiotic spacer planned   JOINT REPLACEMENT     not replaced- only surgery 2007/2011/2012.   LUMBAR LAMINECTOMY/DECOMPRESSION MICRODISCECTOMY  04/19/2011   Procedure: LUMBAR LAMINECTOMY/DECOMPRESSION MICRODISCECTOMY;  Surgeon: Tobi Bastos;  Location: [facility];  Service: Orthopedics;  Laterality: N/A;  Central decompression Lumbar two to lumbar three and Three to Lumbar Four    (xray)   TOTAL KNEE REVISION  12/04/2011   Procedure: TOTAL KNEE REVISION;  Surgeon: Gearlean Alf, MD;  Location: [facility];  Service: Orthopedics;  Laterality: Right;  Reimplantation of a Right Total Knee Arthroplasty   TUBAL LIGATION       OB History   No obstetric history on file.     Family History  Problem Relation Age of Onset   Heart attack Mother    Cancer Mother        cholangiocarcinoma   Heart  disease Father    Heart attack Brother    Heart attack Brother    Heart attack Brother    Coronary artery disease Other    Diabetes Other     Social History   Tobacco Use   Smoking status: Never   Smokeless tobacco: Never  Substance Use Topics   Alcohol use: No   Drug use: No    Home Medications Prior to Admission medications   Medication Sig Start Date End Date Taking? Authorizing Provider  albuterol (VENTOLIN HFA) 108 (90 Base) MCG/ACT inhaler Inhale 1 puff into the lungs every 4 (four) hours as needed for shortness of breath. 08/31/20   [provider]  aspirin EC 81 MG tablet  Take 81 mg by mouth daily. Patient not taking: Reported on 10/03/2020    [provider]  benazepril-hydrochlorthiazide (LOTENSIN HCT) 20-25 MG tablet Hold this medication until you follow up with PCP because your blood pressure was too low. 05/21/19   Enzo Bi, MD  clopidogrel (PLAVIX) 75 MG tablet Take 75 mg by mouth daily. 10/19/12   [provider]  Ferrous Sulfate (IRON) 325 (65 FE) MG TABS Take 65 mg by mouth. Occasionally Patient not taking: Reported on 10/03/2020    [provider]  fish oil-omega-3 fatty acids 1000 MG capsule Take 2 g by mouth daily.    [provider]  gabapentin (NEURONTIN) 100 MG capsule Take 100 mg by mouth 2 (two) times daily. 07/05/20   [provider]  gabapentin (NEURONTIN) 300 MG capsule Take 300 mg by mouth 3 (three) times daily. Patient not taking: Reported on 10/03/2020 05/10/19   [provider]  hydrochlorothiazide (HYDRODIURIL) 25 MG tablet Take by mouth. Patient not taking: Reported on 10/03/2020 09/15/19   [provider]  labetalol (NORMODYNE) 300 MG tablet Take 300 mg by mouth 2 (two) times daily.    [provider]  lisinopril (ZESTRIL) 20 MG tablet Take by mouth. Patient not taking: Reported on 10/03/2020 09/14/19   [provider]  metFORMIN (GLUCOPHAGE) 500 MG tablet Take 1 tablet by mouth 2 (two) times daily. 11/28/16   [provider]  Multiple Vitamin (MULTIVITAMIN) tablet Take 1 tablet by mouth daily. Patient not taking: Reported on 10/03/2020    [provider]  Multiple Vitamins-Minerals (VITAMIN D3 COMPLETE PO) Take 1 tablet by mouth daily.    [provider]  NIFEdipine (ADALAT CC) 30 MG 24 hr tablet Take by mouth. Patient not taking: Reported on 10/03/2020 09/15/19   [provider]  pravastatin (PRAVACHOL) 40 MG tablet Take 40 mg by mouth daily. 08/02/19   [provider]  tiZANidine (ZANAFLEX) 2 MG tablet Take by  mouth. Patient not taking: Reported on 10/03/2020    [provider]  vitamin B-12 (CYANOCOBALAMIN) 100 MCG tablet Take 100 mcg by mouth daily.    [provider]  vitamin C (ASCORBIC ACID) 500 MG tablet Take 500 mg by mouth daily.    [provider]  VITAMIN E PO Take 1 tablet by mouth daily.    [provider]    Allergies    Ciprofloxacin  Review of Systems   Review of Systems  Constitutional:  Negative for chills and fever.  HENT:  Negative for sore throat.   Eyes:  Negative for visual disturbance.  Respiratory:  Negative for cough and shortness of breath.   Cardiovascular:  Negative for chest pain and leg swelling.  Gastrointestinal:  Negative for abdominal pain, diarrhea and vomiting.  Genitourinary:  Negative for dysuria and flank pain.  Musculoskeletal:  Negative for back pain and neck pain.  Skin:  Negative for rash.  Neurological:  Positive for weakness. Negative for speech difficulty, numbness and headaches.  Hematological:  Does not bruise/bleed easily.  Psychiatric/Behavioral:  Negative for agitation.    Physical Exam Updated Vital Signs BP 129/74 (BP Location: Left Arm)   Pulse 66   Temp 98.6 F (37 C) (Oral)   Resp 20   Ht 1.664 m (5' 5.5")   Wt 93 kg   SpO2 96%   BMI 33.59 kg/m   Physical Exam Vitals and nursing note reviewed.  Constitutional:      Appearance: Normal appearance. She is well-developed.  HENT:     Head: Atraumatic.     Nose: Nose normal.     Mouth/Throat:     Mouth: Mucous membranes are moist.  Eyes:     General: No scleral icterus.    Conjunctiva/sclera: Conjunctivae normal.     Pupils: Pupils are equal, round, and reactive to light.  Neck:     Vascular: No carotid bruit.     Trachea: No tracheal deviation.  Cardiovascular:     Rate and Rhythm: Normal rate and regular rhythm.     Pulses: Normal pulses.     Heart sounds: Normal heart sounds. No murmur heard.   No friction rub. No gallop.   Pulmonary:     Effort: Pulmonary effort is normal. No respiratory distress.     Breath sounds: Normal breath sounds.  Abdominal:     General: Bowel sounds are normal. There is no distension.     Palpations: Abdomen is soft. There is no mass.     Tenderness: There is no abdominal tenderness. There is no guarding or rebound.     Hernia: No hernia is present.  Genitourinary:    Comments: No cva tenderness.  Musculoskeletal:        General: No swelling or tenderness.     Cervical back: Normal range of motion and neck supple. No rigidity. No muscular tenderness.     Right lower leg: No edema.     Left lower leg: No edema.  Skin:    General: Skin is warm and dry.     Findings: No rash.  Neurological:     Mental Status: She is alert.     Comments: Alert, responds briefly to questions and speech is quiet, but no gross asphasia or dysarthria noted.  Left pronator drift, left weakness.   Psychiatric:        Mood and Affect: Mood normal.    ED Results / Procedures / Treatments   Labs (all labs ordered are listed, but only abnormal results are displayed) Results for orders placed or performed during the hospital encounter of 03/14/21  Comprehensive metabolic panel  Result Value Ref Range   Sodium 139 135 - 145 mmol/L   Potassium 3.7 3.5 - 5.1 mmol/L   Chloride 102 98 - 111 mmol/L   CO2 28 22 - 32 mmol/L   Glucose, Bld 129 (H) 70 - 99 mg/dL   BUN 19 8 - 23 mg/dL   Creatinine, Ser 0.67 0.44 - 1.00 mg/dL   Calcium 9.1 8.9 - 10.3 mg/dL   Total Protein 6.9 6.5 - 8.1 g/dL   Albumin 4.2 3.5 - 5.0 g/dL   AST 16 15 - 41 U/L   ALT 15 0 - 44 U/L   Alkaline Phosphatase 82 38 - 126 U/L  Total Bilirubin 0.7 0.3 - 1.2 mg/dL   GFR, Estimated >60 >60 mL/min   Anion gap 9 5 - 15  CBC  Result Value Ref Range   WBC 7.5 4.0 - 10.5 K/uL   RBC 4.49 3.87 - 5.11 MIL/uL   Hemoglobin 12.3 12.0 - 15.0 g/dL   HCT 39.3 36.0 - 46.0 %   MCV 87.5 80.0 - 100.0 fL   MCH 27.4 26.0 - 34.0 pg   MCHC 31.3  30.0 - 36.0 g/dL   RDW 16.9 (H) 11.5 - 15.5 %   Platelets 224 150 - 400 K/uL   nRBC 0.0 0.0 - 0.2 %  CBG monitoring, ED  Result Value Ref Range   Glucose-Capillary 136 (H) 70 - 99 mg/dL     EKG None  Radiology DG Chest 1 View  Result Date: 03/14/2021 CLINICAL DATA:  March 2021 pt had hemorrhagic stroke. APPROX.last 24 hours daughter states she is unable to stand at all, states pt is leaning to the left and is extremely weak. Hx COPD, nonsmoker EXAM: CHEST  1 VIEW COMPARISON:  01/01/2021 FINDINGS: Cardiac silhouette is normal in size. No mediastinal or hilar masses. Clear lungs.  No pleural effusion or pneumothorax. Skeletal structures are grossly intact. IMPRESSION: No active disease. Electronically Signed   By: Lajean Manes M.D.   On: 03/14/2021 14:40   CT HEAD WO CONTRAST (5MM)  Result Date: 03/14/2021 CLINICAL DATA:  Mental status change, unknown cause EXAM: CT HEAD WITHOUT CONTRAST TECHNIQUE: Contiguous axial images were obtained from the base of the skull through the vertex without intravenous contrast. COMPARISON:  10/03/2020 FINDINGS: Brain: No evidence of acute infarction, hemorrhage, hydrocephalus, extra-axial collection or mass lesion/mass effect. Moderate low-density changes within the periventricular and subcortical white matter compatible with chronic microvascular ischemic change. Mild diffuse cerebral volume loss. Vascular: Atherosclerotic calcifications involving the large vessels of the skull base. No unexpected hyperdense vessel. Skull: Normal. Negative for fracture or focal lesion. Sinuses/Orbits: No acute finding. Other: None. IMPRESSION: 1. No acute intracranial findings. 2. Chronic microvascular ischemic change and cerebral volume loss. Electronically Signed   By: Davina Poke D.O.   On: 03/14/2021 14:43    Procedures Procedures   Medications Ordered in ED Medications - No data to display  ED Course  I have reviewed the triage vital signs and the nursing  notes.  Pertinent labs & imaging results that were available during my care of the patient were reviewed by me and considered in my medical decision making (see chart for details).    MDM Rules/Calculators/A&P                           Iv ns. Continuous pulse ox and cardiac monitoring. Stat labs. Imaging ordered.   Reviewed nursing notes and prior charts for additional history.   Labs reviewed/interpreted by me - wbc and hgb normal. Chem normal. Ua pending.   CT reviewed/interpreted by me  -  no hem  CXR reviewed/interpreted by me - no pna.   Recheck pt, no change from initial.  1530 UA and MRI pending - signed out to Dr Rogene Houston.     Final Clinical Impression(s) / ED Diagnoses Final diagnoses:  None    Rx / DC Orders ED Discharge Orders     None        Lajean Saver, MD 03/14/21 1531

## 2021-03-14 NOTE — ED Notes (Signed)
EDP notified of neurological findings/deficit. En route to assess pt

## 2021-03-22 DIAGNOSIS — M6281 Muscle weakness (generalized): Secondary | ICD-10-CM | POA: Diagnosis not present

## 2021-03-22 DIAGNOSIS — R262 Difficulty in walking, not elsewhere classified: Secondary | ICD-10-CM | POA: Diagnosis not present

## 2021-03-22 DIAGNOSIS — E1140 Type 2 diabetes mellitus with diabetic neuropathy, unspecified: Secondary | ICD-10-CM | POA: Diagnosis not present

## 2021-03-22 DIAGNOSIS — R296 Repeated falls: Secondary | ICD-10-CM | POA: Diagnosis not present

## 2021-03-27 DIAGNOSIS — E1140 Type 2 diabetes mellitus with diabetic neuropathy, unspecified: Secondary | ICD-10-CM | POA: Diagnosis not present

## 2021-03-27 DIAGNOSIS — R296 Repeated falls: Secondary | ICD-10-CM | POA: Diagnosis not present

## 2021-03-27 DIAGNOSIS — R262 Difficulty in walking, not elsewhere classified: Secondary | ICD-10-CM | POA: Diagnosis not present

## 2021-03-27 DIAGNOSIS — M6281 Muscle weakness (generalized): Secondary | ICD-10-CM | POA: Diagnosis not present

## 2021-03-29 DIAGNOSIS — R262 Difficulty in walking, not elsewhere classified: Secondary | ICD-10-CM | POA: Diagnosis not present

## 2021-03-29 DIAGNOSIS — E1140 Type 2 diabetes mellitus with diabetic neuropathy, unspecified: Secondary | ICD-10-CM | POA: Diagnosis not present

## 2021-03-29 DIAGNOSIS — R296 Repeated falls: Secondary | ICD-10-CM | POA: Diagnosis not present

## 2021-03-29 DIAGNOSIS — M6281 Muscle weakness (generalized): Secondary | ICD-10-CM | POA: Diagnosis not present

## 2021-04-03 DIAGNOSIS — E1140 Type 2 diabetes mellitus with diabetic neuropathy, unspecified: Secondary | ICD-10-CM | POA: Diagnosis not present

## 2021-04-03 DIAGNOSIS — M6281 Muscle weakness (generalized): Secondary | ICD-10-CM | POA: Diagnosis not present

## 2021-04-03 DIAGNOSIS — R262 Difficulty in walking, not elsewhere classified: Secondary | ICD-10-CM | POA: Diagnosis not present

## 2021-04-03 DIAGNOSIS — R296 Repeated falls: Secondary | ICD-10-CM | POA: Diagnosis not present

## 2021-04-10 DIAGNOSIS — R262 Difficulty in walking, not elsewhere classified: Secondary | ICD-10-CM | POA: Diagnosis not present

## 2021-04-10 DIAGNOSIS — E1140 Type 2 diabetes mellitus with diabetic neuropathy, unspecified: Secondary | ICD-10-CM | POA: Diagnosis not present

## 2021-04-10 DIAGNOSIS — R296 Repeated falls: Secondary | ICD-10-CM | POA: Diagnosis not present

## 2021-04-10 DIAGNOSIS — M6281 Muscle weakness (generalized): Secondary | ICD-10-CM | POA: Diagnosis not present

## 2021-04-13 DIAGNOSIS — R262 Difficulty in walking, not elsewhere classified: Secondary | ICD-10-CM | POA: Diagnosis not present

## 2021-04-13 DIAGNOSIS — E1140 Type 2 diabetes mellitus with diabetic neuropathy, unspecified: Secondary | ICD-10-CM | POA: Diagnosis not present

## 2021-04-13 DIAGNOSIS — R296 Repeated falls: Secondary | ICD-10-CM | POA: Diagnosis not present

## 2021-04-13 DIAGNOSIS — M6281 Muscle weakness (generalized): Secondary | ICD-10-CM | POA: Diagnosis not present

## 2021-04-17 DIAGNOSIS — E1140 Type 2 diabetes mellitus with diabetic neuropathy, unspecified: Secondary | ICD-10-CM | POA: Diagnosis not present

## 2021-04-17 DIAGNOSIS — R296 Repeated falls: Secondary | ICD-10-CM | POA: Diagnosis not present

## 2021-04-17 DIAGNOSIS — R262 Difficulty in walking, not elsewhere classified: Secondary | ICD-10-CM | POA: Diagnosis not present

## 2021-04-17 DIAGNOSIS — M6281 Muscle weakness (generalized): Secondary | ICD-10-CM | POA: Diagnosis not present

## 2021-04-22 DIAGNOSIS — R296 Repeated falls: Secondary | ICD-10-CM | POA: Diagnosis not present

## 2021-04-22 DIAGNOSIS — E1140 Type 2 diabetes mellitus with diabetic neuropathy, unspecified: Secondary | ICD-10-CM | POA: Diagnosis not present

## 2021-04-22 DIAGNOSIS — R262 Difficulty in walking, not elsewhere classified: Secondary | ICD-10-CM | POA: Diagnosis not present

## 2021-04-22 DIAGNOSIS — M6281 Muscle weakness (generalized): Secondary | ICD-10-CM | POA: Diagnosis not present

## 2021-04-24 DIAGNOSIS — E1140 Type 2 diabetes mellitus with diabetic neuropathy, unspecified: Secondary | ICD-10-CM | POA: Diagnosis not present

## 2021-04-24 DIAGNOSIS — R262 Difficulty in walking, not elsewhere classified: Secondary | ICD-10-CM | POA: Diagnosis not present

## 2021-04-24 DIAGNOSIS — M6281 Muscle weakness (generalized): Secondary | ICD-10-CM | POA: Diagnosis not present

## 2021-04-24 DIAGNOSIS — R296 Repeated falls: Secondary | ICD-10-CM | POA: Diagnosis not present

## 2021-04-27 DIAGNOSIS — R262 Difficulty in walking, not elsewhere classified: Secondary | ICD-10-CM | POA: Diagnosis not present

## 2021-04-27 DIAGNOSIS — M6281 Muscle weakness (generalized): Secondary | ICD-10-CM | POA: Diagnosis not present

## 2021-04-27 DIAGNOSIS — R296 Repeated falls: Secondary | ICD-10-CM | POA: Diagnosis not present

## 2021-04-27 DIAGNOSIS — E1140 Type 2 diabetes mellitus with diabetic neuropathy, unspecified: Secondary | ICD-10-CM | POA: Diagnosis not present

## 2021-05-10 DIAGNOSIS — M6281 Muscle weakness (generalized): Secondary | ICD-10-CM | POA: Diagnosis not present

## 2021-05-10 DIAGNOSIS — R262 Difficulty in walking, not elsewhere classified: Secondary | ICD-10-CM | POA: Diagnosis not present

## 2021-05-10 DIAGNOSIS — E1140 Type 2 diabetes mellitus with diabetic neuropathy, unspecified: Secondary | ICD-10-CM | POA: Diagnosis not present

## 2021-05-10 DIAGNOSIS — R296 Repeated falls: Secondary | ICD-10-CM | POA: Diagnosis not present

## 2021-05-15 DIAGNOSIS — E1140 Type 2 diabetes mellitus with diabetic neuropathy, unspecified: Secondary | ICD-10-CM | POA: Diagnosis not present

## 2021-05-15 DIAGNOSIS — R296 Repeated falls: Secondary | ICD-10-CM | POA: Diagnosis not present

## 2021-05-15 DIAGNOSIS — R262 Difficulty in walking, not elsewhere classified: Secondary | ICD-10-CM | POA: Diagnosis not present

## 2021-05-15 DIAGNOSIS — M6281 Muscle weakness (generalized): Secondary | ICD-10-CM | POA: Diagnosis not present

## 2021-05-30 DIAGNOSIS — R262 Difficulty in walking, not elsewhere classified: Secondary | ICD-10-CM | POA: Diagnosis not present

## 2021-05-30 DIAGNOSIS — M6281 Muscle weakness (generalized): Secondary | ICD-10-CM | POA: Diagnosis not present

## 2021-05-30 DIAGNOSIS — E1140 Type 2 diabetes mellitus with diabetic neuropathy, unspecified: Secondary | ICD-10-CM | POA: Diagnosis not present

## 2021-05-30 DIAGNOSIS — R296 Repeated falls: Secondary | ICD-10-CM | POA: Diagnosis not present

## 2021-06-06 DIAGNOSIS — M6281 Muscle weakness (generalized): Secondary | ICD-10-CM | POA: Diagnosis not present

## 2021-06-06 DIAGNOSIS — R296 Repeated falls: Secondary | ICD-10-CM | POA: Diagnosis not present

## 2021-06-06 DIAGNOSIS — E1140 Type 2 diabetes mellitus with diabetic neuropathy, unspecified: Secondary | ICD-10-CM | POA: Diagnosis not present

## 2021-06-06 DIAGNOSIS — R262 Difficulty in walking, not elsewhere classified: Secondary | ICD-10-CM | POA: Diagnosis not present

## 2021-06-13 DIAGNOSIS — R296 Repeated falls: Secondary | ICD-10-CM | POA: Diagnosis not present

## 2021-06-13 DIAGNOSIS — E1140 Type 2 diabetes mellitus with diabetic neuropathy, unspecified: Secondary | ICD-10-CM | POA: Diagnosis not present

## 2021-06-13 DIAGNOSIS — M6281 Muscle weakness (generalized): Secondary | ICD-10-CM | POA: Diagnosis not present

## 2021-06-13 DIAGNOSIS — R262 Difficulty in walking, not elsewhere classified: Secondary | ICD-10-CM | POA: Diagnosis not present

## 2021-06-22 DIAGNOSIS — C44722 Squamous cell carcinoma of skin of right lower limb, including hip: Secondary | ICD-10-CM | POA: Diagnosis not present

## 2021-06-22 DIAGNOSIS — D485 Neoplasm of uncertain behavior of skin: Secondary | ICD-10-CM | POA: Diagnosis not present

## 2021-07-12 DIAGNOSIS — D485 Neoplasm of uncertain behavior of skin: Secondary | ICD-10-CM | POA: Diagnosis not present

## 2021-07-12 DIAGNOSIS — C44722 Squamous cell carcinoma of skin of right lower limb, including hip: Secondary | ICD-10-CM | POA: Diagnosis not present

## 2021-07-12 DIAGNOSIS — I872 Venous insufficiency (chronic) (peripheral): Secondary | ICD-10-CM | POA: Diagnosis not present

## 2021-07-13 DIAGNOSIS — J40 Bronchitis, not specified as acute or chronic: Secondary | ICD-10-CM | POA: Diagnosis not present

## 2021-07-13 DIAGNOSIS — E1143 Type 2 diabetes mellitus with diabetic autonomic (poly)neuropathy: Secondary | ICD-10-CM | POA: Diagnosis not present

## 2021-07-13 DIAGNOSIS — N3021 Other chronic cystitis with hematuria: Secondary | ICD-10-CM | POA: Diagnosis not present

## 2021-07-13 DIAGNOSIS — L303 Infective dermatitis: Secondary | ICD-10-CM | POA: Diagnosis not present

## 2021-07-13 DIAGNOSIS — I872 Venous insufficiency (chronic) (peripheral): Secondary | ICD-10-CM | POA: Diagnosis not present

## 2021-07-17 DIAGNOSIS — N390 Urinary tract infection, site not specified: Secondary | ICD-10-CM | POA: Diagnosis not present

## 2021-07-17 DIAGNOSIS — E781 Pure hyperglyceridemia: Secondary | ICD-10-CM | POA: Diagnosis not present

## 2021-07-25 DIAGNOSIS — D485 Neoplasm of uncertain behavior of skin: Secondary | ICD-10-CM | POA: Diagnosis not present

## 2021-07-25 DIAGNOSIS — Z4801 Encounter for change or removal of surgical wound dressing: Secondary | ICD-10-CM | POA: Diagnosis not present

## 2021-07-25 DIAGNOSIS — C44622 Squamous cell carcinoma of skin of right upper limb, including shoulder: Secondary | ICD-10-CM | POA: Diagnosis not present

## 2021-07-25 DIAGNOSIS — C44722 Squamous cell carcinoma of skin of right lower limb, including hip: Secondary | ICD-10-CM | POA: Diagnosis not present

## 2021-07-25 DIAGNOSIS — L821 Other seborrheic keratosis: Secondary | ICD-10-CM | POA: Diagnosis not present

## 2021-07-30 DIAGNOSIS — S81801A Unspecified open wound, right lower leg, initial encounter: Secondary | ICD-10-CM | POA: Diagnosis not present

## 2021-07-30 DIAGNOSIS — Z48817 Encounter for surgical aftercare following surgery on the skin and subcutaneous tissue: Secondary | ICD-10-CM | POA: Diagnosis not present

## 2021-08-10 DIAGNOSIS — I872 Venous insufficiency (chronic) (peripheral): Secondary | ICD-10-CM | POA: Diagnosis not present

## 2021-08-24 DIAGNOSIS — Z4801 Encounter for change or removal of surgical wound dressing: Secondary | ICD-10-CM | POA: Diagnosis not present

## 2021-08-24 DIAGNOSIS — S81801A Unspecified open wound, right lower leg, initial encounter: Secondary | ICD-10-CM | POA: Diagnosis not present

## 2021-09-07 DIAGNOSIS — C44622 Squamous cell carcinoma of skin of right upper limb, including shoulder: Secondary | ICD-10-CM | POA: Diagnosis not present

## 2021-09-07 DIAGNOSIS — S81801A Unspecified open wound, right lower leg, initial encounter: Secondary | ICD-10-CM | POA: Diagnosis not present

## 2021-09-07 DIAGNOSIS — D0471 Carcinoma in situ of skin of right lower limb, including hip: Secondary | ICD-10-CM | POA: Diagnosis not present

## 2021-09-07 DIAGNOSIS — L905 Scar conditions and fibrosis of skin: Secondary | ICD-10-CM | POA: Diagnosis not present

## 2021-10-18 DIAGNOSIS — L218 Other seborrheic dermatitis: Secondary | ICD-10-CM | POA: Diagnosis not present

## 2021-10-18 DIAGNOSIS — I872 Venous insufficiency (chronic) (peripheral): Secondary | ICD-10-CM | POA: Diagnosis not present

## 2021-10-18 DIAGNOSIS — D0471 Carcinoma in situ of skin of right lower limb, including hip: Secondary | ICD-10-CM | POA: Diagnosis not present

## 2021-11-06 DIAGNOSIS — E1143 Type 2 diabetes mellitus with diabetic autonomic (poly)neuropathy: Secondary | ICD-10-CM | POA: Diagnosis not present

## 2021-11-06 DIAGNOSIS — I10 Essential (primary) hypertension: Secondary | ICD-10-CM | POA: Diagnosis not present

## 2021-11-20 DIAGNOSIS — I872 Venous insufficiency (chronic) (peripheral): Secondary | ICD-10-CM | POA: Diagnosis not present

## 2022-01-25 ENCOUNTER — Other Ambulatory Visit: Payer: Self-pay | Admitting: *Deleted

## 2022-01-25 NOTE — Patient Outreach (Signed)
  Care Coordination   01/25/2022 Name: [name] MRN: [id number] DOB: [birth year]   Care Coordination Outreach Attempts:  Contact was made with the patient today to offer care coordination services as a benefit of their health plan. The patient requested a return call on a later date.   Follow Up Plan:  Additional outreach attempts will be made to offer the patient care coordination information and services.   Encounter Outcome:  Pt. Request to Call Back  Care Coordination Interventions Activated:  No   Care Coordination Interventions:  No, not indicated    Valente David, RN, MSN, [facility] Care Coordinator [phone]

## 2022-01-31 ENCOUNTER — Other Ambulatory Visit: Payer: Self-pay | Admitting: *Deleted

## 2022-01-31 NOTE — Patient Outreach (Signed)
  Care Coordination   01/31/2022 Name: [name] MRN: [id number] DOB: 1941-02-05   Care Coordination Outreach Attempts:  A second unsuccessful outreach was attempted today to offer the patient with information about available care coordination services as a benefit of their health plan.     Follow Up Plan:  Additional outreach attempts will be made to offer the patient care coordination information and services.   Encounter Outcome:  No Answer  Care Coordination Interventions Activated:  No   Care Coordination Interventions:  No, not indicated    Valente David, RN, MSN, [facility] Care Management Care Management Coordinator [phone]

## 2022-02-04 ENCOUNTER — Other Ambulatory Visit: Payer: Self-pay | Admitting: *Deleted

## 2022-02-04 NOTE — Patient Outreach (Addendum)
  Care Coordination   02/04/2022 Name: [name] MRN: [id number] DOB: 1940-10-18   Care Coordination Outreach Attempts:  A third unsuccessful outreach was attempted today to offer the patient with information about available care coordination services as a benefit of their health plan.   Follow Up Plan:  No further outreach attempts will be made at this time. We have been unable to contact the patient to offer or enroll patient in care coordination services  Encounter Outcome:  No Answer  Care Coordination Interventions Activated:  No   Care Coordination Interventions:  No, not indicated    Valente David, RN, MSN, [facility] Care Management Care Management Coordinator [phone]

## 2022-04-11 DIAGNOSIS — Z8673 Personal history of transient ischemic attack (TIA), and cerebral infarction without residual deficits: Secondary | ICD-10-CM | POA: Diagnosis not present

## 2022-04-11 DIAGNOSIS — E1143 Type 2 diabetes mellitus with diabetic autonomic (poly)neuropathy: Secondary | ICD-10-CM | POA: Diagnosis not present

## 2022-04-11 DIAGNOSIS — E1165 Type 2 diabetes mellitus with hyperglycemia: Secondary | ICD-10-CM | POA: Diagnosis not present

## 2022-04-11 DIAGNOSIS — E7849 Other hyperlipidemia: Secondary | ICD-10-CM | POA: Diagnosis not present

## 2022-04-11 DIAGNOSIS — I10 Essential (primary) hypertension: Secondary | ICD-10-CM | POA: Diagnosis not present

## 2022-04-22 DIAGNOSIS — R262 Difficulty in walking, not elsewhere classified: Secondary | ICD-10-CM | POA: Diagnosis not present

## 2022-04-22 DIAGNOSIS — M6281 Muscle weakness (generalized): Secondary | ICD-10-CM | POA: Diagnosis not present

## 2022-04-22 DIAGNOSIS — R296 Repeated falls: Secondary | ICD-10-CM | POA: Diagnosis not present

## 2022-04-24 DIAGNOSIS — R262 Difficulty in walking, not elsewhere classified: Secondary | ICD-10-CM | POA: Diagnosis not present

## 2022-04-24 DIAGNOSIS — M6281 Muscle weakness (generalized): Secondary | ICD-10-CM | POA: Diagnosis not present

## 2022-04-24 DIAGNOSIS — R296 Repeated falls: Secondary | ICD-10-CM | POA: Diagnosis not present

## 2022-04-30 DIAGNOSIS — M6281 Muscle weakness (generalized): Secondary | ICD-10-CM | POA: Diagnosis not present

## 2022-04-30 DIAGNOSIS — R296 Repeated falls: Secondary | ICD-10-CM | POA: Diagnosis not present

## 2022-04-30 DIAGNOSIS — R262 Difficulty in walking, not elsewhere classified: Secondary | ICD-10-CM | POA: Diagnosis not present

## 2022-05-08 DIAGNOSIS — R262 Difficulty in walking, not elsewhere classified: Secondary | ICD-10-CM | POA: Diagnosis not present

## 2022-05-08 DIAGNOSIS — M6281 Muscle weakness (generalized): Secondary | ICD-10-CM | POA: Diagnosis not present

## 2022-05-08 DIAGNOSIS — R296 Repeated falls: Secondary | ICD-10-CM | POA: Diagnosis not present

## 2022-05-10 DIAGNOSIS — M6281 Muscle weakness (generalized): Secondary | ICD-10-CM | POA: Diagnosis not present

## 2022-05-10 DIAGNOSIS — R296 Repeated falls: Secondary | ICD-10-CM | POA: Diagnosis not present

## 2022-05-10 DIAGNOSIS — R262 Difficulty in walking, not elsewhere classified: Secondary | ICD-10-CM | POA: Diagnosis not present

## 2022-05-14 DIAGNOSIS — M6281 Muscle weakness (generalized): Secondary | ICD-10-CM | POA: Diagnosis not present

## 2022-05-14 DIAGNOSIS — R262 Difficulty in walking, not elsewhere classified: Secondary | ICD-10-CM | POA: Diagnosis not present

## 2022-05-14 DIAGNOSIS — R296 Repeated falls: Secondary | ICD-10-CM | POA: Diagnosis not present

## 2022-05-16 DIAGNOSIS — M6281 Muscle weakness (generalized): Secondary | ICD-10-CM | POA: Diagnosis not present

## 2022-05-16 DIAGNOSIS — R296 Repeated falls: Secondary | ICD-10-CM | POA: Diagnosis not present

## 2022-05-16 DIAGNOSIS — R262 Difficulty in walking, not elsewhere classified: Secondary | ICD-10-CM | POA: Diagnosis not present

## 2022-05-20 DIAGNOSIS — M6281 Muscle weakness (generalized): Secondary | ICD-10-CM | POA: Diagnosis not present

## 2022-05-20 DIAGNOSIS — R296 Repeated falls: Secondary | ICD-10-CM | POA: Diagnosis not present

## 2022-05-20 DIAGNOSIS — R262 Difficulty in walking, not elsewhere classified: Secondary | ICD-10-CM | POA: Diagnosis not present

## 2022-05-21 DIAGNOSIS — R296 Repeated falls: Secondary | ICD-10-CM | POA: Diagnosis not present

## 2022-05-21 DIAGNOSIS — R262 Difficulty in walking, not elsewhere classified: Secondary | ICD-10-CM | POA: Diagnosis not present

## 2022-05-21 DIAGNOSIS — M6281 Muscle weakness (generalized): Secondary | ICD-10-CM | POA: Diagnosis not present

## 2022-05-24 DIAGNOSIS — R531 Weakness: Secondary | ICD-10-CM | POA: Diagnosis not present

## 2022-05-24 DIAGNOSIS — M5136 Other intervertebral disc degeneration, lumbar region: Secondary | ICD-10-CM | POA: Diagnosis not present

## 2022-05-24 DIAGNOSIS — R569 Unspecified convulsions: Secondary | ICD-10-CM | POA: Diagnosis not present

## 2022-05-24 DIAGNOSIS — R4781 Slurred speech: Secondary | ICD-10-CM | POA: Diagnosis not present

## 2022-05-24 DIAGNOSIS — G459 Transient cerebral ischemic attack, unspecified: Secondary | ICD-10-CM | POA: Diagnosis not present

## 2022-05-24 DIAGNOSIS — R079 Chest pain, unspecified: Secondary | ICD-10-CM | POA: Diagnosis not present

## 2022-05-24 DIAGNOSIS — I10 Essential (primary) hypertension: Secondary | ICD-10-CM | POA: Diagnosis not present

## 2022-05-24 DIAGNOSIS — J449 Chronic obstructive pulmonary disease, unspecified: Secondary | ICD-10-CM | POA: Diagnosis not present

## 2022-05-24 DIAGNOSIS — R54 Age-related physical debility: Secondary | ICD-10-CM | POA: Diagnosis not present

## 2022-05-24 DIAGNOSIS — R410 Disorientation, unspecified: Secondary | ICD-10-CM | POA: Diagnosis not present

## 2022-05-24 DIAGNOSIS — E119 Type 2 diabetes mellitus without complications: Secondary | ICD-10-CM | POA: Diagnosis not present

## 2022-05-24 DIAGNOSIS — Z7984 Long term (current) use of oral hypoglycemic drugs: Secondary | ICD-10-CM | POA: Diagnosis not present

## 2022-05-24 DIAGNOSIS — Z79899 Other long term (current) drug therapy: Secondary | ICD-10-CM | POA: Diagnosis not present

## 2022-05-25 DIAGNOSIS — Z7984 Long term (current) use of oral hypoglycemic drugs: Secondary | ICD-10-CM | POA: Diagnosis not present

## 2022-05-25 DIAGNOSIS — G459 Transient cerebral ischemic attack, unspecified: Secondary | ICD-10-CM | POA: Diagnosis not present

## 2022-05-25 DIAGNOSIS — M5136 Other intervertebral disc degeneration, lumbar region: Secondary | ICD-10-CM | POA: Diagnosis not present

## 2022-05-25 DIAGNOSIS — R54 Age-related physical debility: Secondary | ICD-10-CM | POA: Diagnosis not present

## 2022-05-25 DIAGNOSIS — J449 Chronic obstructive pulmonary disease, unspecified: Secondary | ICD-10-CM | POA: Diagnosis not present

## 2022-05-25 DIAGNOSIS — Z79899 Other long term (current) drug therapy: Secondary | ICD-10-CM | POA: Diagnosis not present

## 2022-05-25 DIAGNOSIS — E119 Type 2 diabetes mellitus without complications: Secondary | ICD-10-CM | POA: Diagnosis not present

## 2022-05-26 DIAGNOSIS — Z7984 Long term (current) use of oral hypoglycemic drugs: Secondary | ICD-10-CM | POA: Diagnosis not present

## 2022-05-26 DIAGNOSIS — M5136 Other intervertebral disc degeneration, lumbar region: Secondary | ICD-10-CM | POA: Diagnosis not present

## 2022-05-26 DIAGNOSIS — R54 Age-related physical debility: Secondary | ICD-10-CM | POA: Diagnosis not present

## 2022-05-26 DIAGNOSIS — E119 Type 2 diabetes mellitus without complications: Secondary | ICD-10-CM | POA: Diagnosis not present

## 2022-05-26 DIAGNOSIS — G459 Transient cerebral ischemic attack, unspecified: Secondary | ICD-10-CM | POA: Diagnosis not present

## 2022-05-26 DIAGNOSIS — J449 Chronic obstructive pulmonary disease, unspecified: Secondary | ICD-10-CM | POA: Diagnosis not present

## 2022-05-26 DIAGNOSIS — Z79899 Other long term (current) drug therapy: Secondary | ICD-10-CM | POA: Diagnosis not present

## 2022-05-27 DIAGNOSIS — M5136 Other intervertebral disc degeneration, lumbar region: Secondary | ICD-10-CM | POA: Diagnosis not present

## 2022-05-27 DIAGNOSIS — I10 Essential (primary) hypertension: Secondary | ICD-10-CM | POA: Diagnosis not present

## 2022-05-27 DIAGNOSIS — E785 Hyperlipidemia, unspecified: Secondary | ICD-10-CM | POA: Diagnosis not present

## 2022-05-27 DIAGNOSIS — E119 Type 2 diabetes mellitus without complications: Secondary | ICD-10-CM | POA: Diagnosis not present

## 2022-05-27 DIAGNOSIS — J449 Chronic obstructive pulmonary disease, unspecified: Secondary | ICD-10-CM | POA: Diagnosis not present

## 2022-05-27 DIAGNOSIS — G459 Transient cerebral ischemic attack, unspecified: Secondary | ICD-10-CM | POA: Diagnosis not present

## 2022-05-27 DIAGNOSIS — R54 Age-related physical debility: Secondary | ICD-10-CM | POA: Diagnosis not present

## 2022-05-28 DIAGNOSIS — J449 Chronic obstructive pulmonary disease, unspecified: Secondary | ICD-10-CM | POA: Diagnosis not present

## 2022-05-28 DIAGNOSIS — E876 Hypokalemia: Secondary | ICD-10-CM | POA: Diagnosis not present

## 2022-05-28 DIAGNOSIS — Z881 Allergy status to other antibiotic agents status: Secondary | ICD-10-CM | POA: Diagnosis not present

## 2022-05-28 DIAGNOSIS — G8190 Hemiplegia, unspecified affecting unspecified side: Secondary | ICD-10-CM | POA: Diagnosis not present

## 2022-05-28 DIAGNOSIS — E119 Type 2 diabetes mellitus without complications: Secondary | ICD-10-CM | POA: Diagnosis not present

## 2022-05-28 DIAGNOSIS — R531 Weakness: Secondary | ICD-10-CM | POA: Diagnosis not present

## 2022-05-28 DIAGNOSIS — I10 Essential (primary) hypertension: Secondary | ICD-10-CM | POA: Diagnosis not present

## 2022-05-28 DIAGNOSIS — Z7984 Long term (current) use of oral hypoglycemic drugs: Secondary | ICD-10-CM | POA: Diagnosis not present

## 2022-05-28 DIAGNOSIS — Z7902 Long term (current) use of antithrombotics/antiplatelets: Secondary | ICD-10-CM | POA: Diagnosis not present

## 2022-05-28 DIAGNOSIS — Z79899 Other long term (current) drug therapy: Secondary | ICD-10-CM | POA: Diagnosis not present

## 2022-05-28 DIAGNOSIS — N390 Urinary tract infection, site not specified: Secondary | ICD-10-CM | POA: Diagnosis not present

## 2022-05-28 DIAGNOSIS — Z743 Need for continuous supervision: Secondary | ICD-10-CM | POA: Diagnosis not present

## 2022-05-28 DIAGNOSIS — Z7401 Bed confinement status: Secondary | ICD-10-CM | POA: Diagnosis not present

## 2022-05-28 DIAGNOSIS — Z8673 Personal history of transient ischemic attack (TIA), and cerebral infarction without residual deficits: Secondary | ICD-10-CM | POA: Diagnosis not present

## 2022-05-28 DIAGNOSIS — Z7982 Long term (current) use of aspirin: Secondary | ICD-10-CM | POA: Diagnosis not present

## 2022-05-28 DIAGNOSIS — R404 Transient alteration of awareness: Secondary | ICD-10-CM | POA: Diagnosis not present

## 2022-05-28 DIAGNOSIS — G928 Other toxic encephalopathy: Secondary | ICD-10-CM | POA: Diagnosis not present

## 2022-05-28 DIAGNOSIS — Z888 Allergy status to other drugs, medicaments and biological substances status: Secondary | ICD-10-CM | POA: Diagnosis not present

## 2022-05-28 DIAGNOSIS — Z7901 Long term (current) use of anticoagulants: Secondary | ICD-10-CM | POA: Diagnosis not present

## 2022-05-28 DIAGNOSIS — E785 Hyperlipidemia, unspecified: Secondary | ICD-10-CM | POA: Diagnosis not present

## 2022-05-29 DIAGNOSIS — Z792 Long term (current) use of antibiotics: Secondary | ICD-10-CM | POA: Diagnosis not present

## 2022-05-29 DIAGNOSIS — L03116 Cellulitis of left lower limb: Secondary | ICD-10-CM | POA: Diagnosis not present

## 2022-05-29 DIAGNOSIS — E119 Type 2 diabetes mellitus without complications: Secondary | ICD-10-CM | POA: Diagnosis not present

## 2022-05-29 DIAGNOSIS — I69351 Hemiplegia and hemiparesis following cerebral infarction affecting right dominant side: Secondary | ICD-10-CM | POA: Diagnosis not present

## 2022-05-29 DIAGNOSIS — N3001 Acute cystitis with hematuria: Secondary | ICD-10-CM | POA: Diagnosis not present

## 2022-05-29 DIAGNOSIS — G928 Other toxic encephalopathy: Secondary | ICD-10-CM | POA: Diagnosis not present

## 2022-05-29 DIAGNOSIS — Z993 Dependence on wheelchair: Secondary | ICD-10-CM | POA: Diagnosis not present

## 2022-05-29 DIAGNOSIS — Z79899 Other long term (current) drug therapy: Secondary | ICD-10-CM | POA: Diagnosis not present

## 2022-05-29 DIAGNOSIS — L03115 Cellulitis of right lower limb: Secondary | ICD-10-CM | POA: Diagnosis not present

## 2022-05-29 DIAGNOSIS — I10 Essential (primary) hypertension: Secondary | ICD-10-CM | POA: Diagnosis not present

## 2022-05-29 DIAGNOSIS — J449 Chronic obstructive pulmonary disease, unspecified: Secondary | ICD-10-CM | POA: Diagnosis not present

## 2022-05-29 DIAGNOSIS — Z7984 Long term (current) use of oral hypoglycemic drugs: Secondary | ICD-10-CM | POA: Diagnosis not present

## 2022-05-30 DIAGNOSIS — N3001 Acute cystitis with hematuria: Secondary | ICD-10-CM | POA: Diagnosis not present

## 2022-05-30 DIAGNOSIS — G928 Other toxic encephalopathy: Secondary | ICD-10-CM | POA: Diagnosis not present

## 2022-05-30 DIAGNOSIS — L03116 Cellulitis of left lower limb: Secondary | ICD-10-CM | POA: Diagnosis not present

## 2022-05-30 DIAGNOSIS — L03115 Cellulitis of right lower limb: Secondary | ICD-10-CM | POA: Diagnosis not present

## 2022-05-30 DIAGNOSIS — I10 Essential (primary) hypertension: Secondary | ICD-10-CM | POA: Diagnosis not present

## 2022-07-25 DIAGNOSIS — E1143 Type 2 diabetes mellitus with diabetic autonomic (poly)neuropathy: Secondary | ICD-10-CM | POA: Diagnosis not present

## 2022-07-25 DIAGNOSIS — I10 Essential (primary) hypertension: Secondary | ICD-10-CM | POA: Diagnosis not present

## 2022-07-25 DIAGNOSIS — E7849 Other hyperlipidemia: Secondary | ICD-10-CM | POA: Diagnosis not present

## 2022-07-25 DIAGNOSIS — J4540 Moderate persistent asthma, uncomplicated: Secondary | ICD-10-CM | POA: Diagnosis not present

## 2022-07-25 DIAGNOSIS — Z8673 Personal history of transient ischemic attack (TIA), and cerebral infarction without residual deficits: Secondary | ICD-10-CM | POA: Diagnosis not present

## 2022-08-01 DIAGNOSIS — I2510 Atherosclerotic heart disease of native coronary artery without angina pectoris: Secondary | ICD-10-CM | POA: Diagnosis not present

## 2022-08-01 DIAGNOSIS — Z7984 Long term (current) use of oral hypoglycemic drugs: Secondary | ICD-10-CM | POA: Diagnosis not present

## 2022-08-01 DIAGNOSIS — Z87891 Personal history of nicotine dependence: Secondary | ICD-10-CM | POA: Diagnosis not present

## 2022-08-01 DIAGNOSIS — I10 Essential (primary) hypertension: Secondary | ICD-10-CM | POA: Diagnosis not present

## 2022-08-01 DIAGNOSIS — J4540 Moderate persistent asthma, uncomplicated: Secondary | ICD-10-CM | POA: Diagnosis not present

## 2022-08-01 DIAGNOSIS — E1143 Type 2 diabetes mellitus with diabetic autonomic (poly)neuropathy: Secondary | ICD-10-CM | POA: Diagnosis not present

## 2022-08-01 DIAGNOSIS — Z8673 Personal history of transient ischemic attack (TIA), and cerebral infarction without residual deficits: Secondary | ICD-10-CM | POA: Diagnosis not present

## 2022-08-01 DIAGNOSIS — E11319 Type 2 diabetes mellitus with unspecified diabetic retinopathy without macular edema: Secondary | ICD-10-CM | POA: Diagnosis not present

## 2022-08-01 DIAGNOSIS — E1121 Type 2 diabetes mellitus with diabetic nephropathy: Secondary | ICD-10-CM | POA: Diagnosis not present

## 2022-08-01 DIAGNOSIS — E7849 Other hyperlipidemia: Secondary | ICD-10-CM | POA: Diagnosis not present

## 2022-08-09 DIAGNOSIS — Z7984 Long term (current) use of oral hypoglycemic drugs: Secondary | ICD-10-CM | POA: Diagnosis not present

## 2022-08-09 DIAGNOSIS — J4540 Moderate persistent asthma, uncomplicated: Secondary | ICD-10-CM | POA: Diagnosis not present

## 2022-08-09 DIAGNOSIS — I2510 Atherosclerotic heart disease of native coronary artery without angina pectoris: Secondary | ICD-10-CM | POA: Diagnosis not present

## 2022-08-09 DIAGNOSIS — E7849 Other hyperlipidemia: Secondary | ICD-10-CM | POA: Diagnosis not present

## 2022-08-09 DIAGNOSIS — E11319 Type 2 diabetes mellitus with unspecified diabetic retinopathy without macular edema: Secondary | ICD-10-CM | POA: Diagnosis not present

## 2022-08-09 DIAGNOSIS — E1121 Type 2 diabetes mellitus with diabetic nephropathy: Secondary | ICD-10-CM | POA: Diagnosis not present

## 2022-08-09 DIAGNOSIS — Z8673 Personal history of transient ischemic attack (TIA), and cerebral infarction without residual deficits: Secondary | ICD-10-CM | POA: Diagnosis not present

## 2022-08-09 DIAGNOSIS — E1143 Type 2 diabetes mellitus with diabetic autonomic (poly)neuropathy: Secondary | ICD-10-CM | POA: Diagnosis not present

## 2022-08-09 DIAGNOSIS — I10 Essential (primary) hypertension: Secondary | ICD-10-CM | POA: Diagnosis not present

## 2022-08-09 DIAGNOSIS — Z87891 Personal history of nicotine dependence: Secondary | ICD-10-CM | POA: Diagnosis not present

## 2022-08-12 DIAGNOSIS — Z87891 Personal history of nicotine dependence: Secondary | ICD-10-CM | POA: Diagnosis not present

## 2022-08-12 DIAGNOSIS — E7849 Other hyperlipidemia: Secondary | ICD-10-CM | POA: Diagnosis not present

## 2022-08-12 DIAGNOSIS — E1121 Type 2 diabetes mellitus with diabetic nephropathy: Secondary | ICD-10-CM | POA: Diagnosis not present

## 2022-08-12 DIAGNOSIS — Z7984 Long term (current) use of oral hypoglycemic drugs: Secondary | ICD-10-CM | POA: Diagnosis not present

## 2022-08-12 DIAGNOSIS — Z8673 Personal history of transient ischemic attack (TIA), and cerebral infarction without residual deficits: Secondary | ICD-10-CM | POA: Diagnosis not present

## 2022-08-12 DIAGNOSIS — I10 Essential (primary) hypertension: Secondary | ICD-10-CM | POA: Diagnosis not present

## 2022-08-12 DIAGNOSIS — E1143 Type 2 diabetes mellitus with diabetic autonomic (poly)neuropathy: Secondary | ICD-10-CM | POA: Diagnosis not present

## 2022-08-12 DIAGNOSIS — I2510 Atherosclerotic heart disease of native coronary artery without angina pectoris: Secondary | ICD-10-CM | POA: Diagnosis not present

## 2022-08-12 DIAGNOSIS — J4540 Moderate persistent asthma, uncomplicated: Secondary | ICD-10-CM | POA: Diagnosis not present

## 2022-08-12 DIAGNOSIS — E11319 Type 2 diabetes mellitus with unspecified diabetic retinopathy without macular edema: Secondary | ICD-10-CM | POA: Diagnosis not present

## 2022-08-14 DIAGNOSIS — E1143 Type 2 diabetes mellitus with diabetic autonomic (poly)neuropathy: Secondary | ICD-10-CM | POA: Diagnosis not present

## 2022-08-14 DIAGNOSIS — Z8673 Personal history of transient ischemic attack (TIA), and cerebral infarction without residual deficits: Secondary | ICD-10-CM | POA: Diagnosis not present

## 2022-08-14 DIAGNOSIS — Z7984 Long term (current) use of oral hypoglycemic drugs: Secondary | ICD-10-CM | POA: Diagnosis not present

## 2022-08-14 DIAGNOSIS — I10 Essential (primary) hypertension: Secondary | ICD-10-CM | POA: Diagnosis not present

## 2022-08-14 DIAGNOSIS — J4540 Moderate persistent asthma, uncomplicated: Secondary | ICD-10-CM | POA: Diagnosis not present

## 2022-08-14 DIAGNOSIS — E11319 Type 2 diabetes mellitus with unspecified diabetic retinopathy without macular edema: Secondary | ICD-10-CM | POA: Diagnosis not present

## 2022-08-14 DIAGNOSIS — E1121 Type 2 diabetes mellitus with diabetic nephropathy: Secondary | ICD-10-CM | POA: Diagnosis not present

## 2022-08-14 DIAGNOSIS — E7849 Other hyperlipidemia: Secondary | ICD-10-CM | POA: Diagnosis not present

## 2022-08-14 DIAGNOSIS — I2510 Atherosclerotic heart disease of native coronary artery without angina pectoris: Secondary | ICD-10-CM | POA: Diagnosis not present

## 2022-08-14 DIAGNOSIS — Z87891 Personal history of nicotine dependence: Secondary | ICD-10-CM | POA: Diagnosis not present

## 2022-08-20 DIAGNOSIS — E1121 Type 2 diabetes mellitus with diabetic nephropathy: Secondary | ICD-10-CM | POA: Diagnosis not present

## 2022-08-20 DIAGNOSIS — Z7984 Long term (current) use of oral hypoglycemic drugs: Secondary | ICD-10-CM | POA: Diagnosis not present

## 2022-08-20 DIAGNOSIS — I2510 Atherosclerotic heart disease of native coronary artery without angina pectoris: Secondary | ICD-10-CM | POA: Diagnosis not present

## 2022-08-20 DIAGNOSIS — Z87891 Personal history of nicotine dependence: Secondary | ICD-10-CM | POA: Diagnosis not present

## 2022-08-20 DIAGNOSIS — Z8673 Personal history of transient ischemic attack (TIA), and cerebral infarction without residual deficits: Secondary | ICD-10-CM | POA: Diagnosis not present

## 2022-08-20 DIAGNOSIS — J4540 Moderate persistent asthma, uncomplicated: Secondary | ICD-10-CM | POA: Diagnosis not present

## 2022-08-20 DIAGNOSIS — I10 Essential (primary) hypertension: Secondary | ICD-10-CM | POA: Diagnosis not present

## 2022-08-20 DIAGNOSIS — E7849 Other hyperlipidemia: Secondary | ICD-10-CM | POA: Diagnosis not present

## 2022-08-20 DIAGNOSIS — E1143 Type 2 diabetes mellitus with diabetic autonomic (poly)neuropathy: Secondary | ICD-10-CM | POA: Diagnosis not present

## 2022-08-20 DIAGNOSIS — E11319 Type 2 diabetes mellitus with unspecified diabetic retinopathy without macular edema: Secondary | ICD-10-CM | POA: Diagnosis not present

## 2022-08-22 DIAGNOSIS — E1121 Type 2 diabetes mellitus with diabetic nephropathy: Secondary | ICD-10-CM | POA: Diagnosis not present

## 2022-08-22 DIAGNOSIS — E11319 Type 2 diabetes mellitus with unspecified diabetic retinopathy without macular edema: Secondary | ICD-10-CM | POA: Diagnosis not present

## 2022-08-22 DIAGNOSIS — I2510 Atherosclerotic heart disease of native coronary artery without angina pectoris: Secondary | ICD-10-CM | POA: Diagnosis not present

## 2022-08-22 DIAGNOSIS — Z7984 Long term (current) use of oral hypoglycemic drugs: Secondary | ICD-10-CM | POA: Diagnosis not present

## 2022-08-22 DIAGNOSIS — E1143 Type 2 diabetes mellitus with diabetic autonomic (poly)neuropathy: Secondary | ICD-10-CM | POA: Diagnosis not present

## 2022-08-22 DIAGNOSIS — J4540 Moderate persistent asthma, uncomplicated: Secondary | ICD-10-CM | POA: Diagnosis not present

## 2022-08-22 DIAGNOSIS — I10 Essential (primary) hypertension: Secondary | ICD-10-CM | POA: Diagnosis not present

## 2022-08-22 DIAGNOSIS — E7849 Other hyperlipidemia: Secondary | ICD-10-CM | POA: Diagnosis not present

## 2022-08-22 DIAGNOSIS — Z87891 Personal history of nicotine dependence: Secondary | ICD-10-CM | POA: Diagnosis not present

## 2022-08-22 DIAGNOSIS — Z8673 Personal history of transient ischemic attack (TIA), and cerebral infarction without residual deficits: Secondary | ICD-10-CM | POA: Diagnosis not present

## 2022-08-30 DIAGNOSIS — E1121 Type 2 diabetes mellitus with diabetic nephropathy: Secondary | ICD-10-CM | POA: Diagnosis not present

## 2022-08-30 DIAGNOSIS — Z87891 Personal history of nicotine dependence: Secondary | ICD-10-CM | POA: Diagnosis not present

## 2022-08-30 DIAGNOSIS — I2510 Atherosclerotic heart disease of native coronary artery without angina pectoris: Secondary | ICD-10-CM | POA: Diagnosis not present

## 2022-08-30 DIAGNOSIS — I10 Essential (primary) hypertension: Secondary | ICD-10-CM | POA: Diagnosis not present

## 2022-08-30 DIAGNOSIS — E1143 Type 2 diabetes mellitus with diabetic autonomic (poly)neuropathy: Secondary | ICD-10-CM | POA: Diagnosis not present

## 2022-08-30 DIAGNOSIS — Z8673 Personal history of transient ischemic attack (TIA), and cerebral infarction without residual deficits: Secondary | ICD-10-CM | POA: Diagnosis not present

## 2022-08-30 DIAGNOSIS — E7849 Other hyperlipidemia: Secondary | ICD-10-CM | POA: Diagnosis not present

## 2022-08-30 DIAGNOSIS — J4540 Moderate persistent asthma, uncomplicated: Secondary | ICD-10-CM | POA: Diagnosis not present

## 2022-08-30 DIAGNOSIS — Z7984 Long term (current) use of oral hypoglycemic drugs: Secondary | ICD-10-CM | POA: Diagnosis not present

## 2022-08-30 DIAGNOSIS — E11319 Type 2 diabetes mellitus with unspecified diabetic retinopathy without macular edema: Secondary | ICD-10-CM | POA: Diagnosis not present

## 2022-09-25 DIAGNOSIS — I10 Essential (primary) hypertension: Secondary | ICD-10-CM | POA: Diagnosis not present

## 2022-09-25 DIAGNOSIS — E1121 Type 2 diabetes mellitus with diabetic nephropathy: Secondary | ICD-10-CM | POA: Diagnosis not present

## 2022-09-25 DIAGNOSIS — I2510 Atherosclerotic heart disease of native coronary artery without angina pectoris: Secondary | ICD-10-CM | POA: Diagnosis not present

## 2022-09-25 DIAGNOSIS — E1143 Type 2 diabetes mellitus with diabetic autonomic (poly)neuropathy: Secondary | ICD-10-CM | POA: Diagnosis not present

## 2022-09-25 DIAGNOSIS — E11319 Type 2 diabetes mellitus with unspecified diabetic retinopathy without macular edema: Secondary | ICD-10-CM | POA: Diagnosis not present

## 2022-09-25 DIAGNOSIS — J4540 Moderate persistent asthma, uncomplicated: Secondary | ICD-10-CM | POA: Diagnosis not present

## 2022-09-25 DIAGNOSIS — E7849 Other hyperlipidemia: Secondary | ICD-10-CM | POA: Diagnosis not present

## 2022-09-25 DIAGNOSIS — Z7984 Long term (current) use of oral hypoglycemic drugs: Secondary | ICD-10-CM | POA: Diagnosis not present

## 2022-09-25 DIAGNOSIS — Z8673 Personal history of transient ischemic attack (TIA), and cerebral infarction without residual deficits: Secondary | ICD-10-CM | POA: Diagnosis not present

## 2022-09-25 DIAGNOSIS — Z87891 Personal history of nicotine dependence: Secondary | ICD-10-CM | POA: Diagnosis not present

## 2022-10-04 DIAGNOSIS — J4540 Moderate persistent asthma, uncomplicated: Secondary | ICD-10-CM | POA: Diagnosis not present

## 2022-10-04 DIAGNOSIS — E1143 Type 2 diabetes mellitus with diabetic autonomic (poly)neuropathy: Secondary | ICD-10-CM | POA: Diagnosis not present

## 2022-10-04 DIAGNOSIS — E7849 Other hyperlipidemia: Secondary | ICD-10-CM | POA: Diagnosis not present

## 2022-10-04 DIAGNOSIS — Z7984 Long term (current) use of oral hypoglycemic drugs: Secondary | ICD-10-CM | POA: Diagnosis not present

## 2022-10-04 DIAGNOSIS — Z8673 Personal history of transient ischemic attack (TIA), and cerebral infarction without residual deficits: Secondary | ICD-10-CM | POA: Diagnosis not present

## 2022-10-04 DIAGNOSIS — E11319 Type 2 diabetes mellitus with unspecified diabetic retinopathy without macular edema: Secondary | ICD-10-CM | POA: Diagnosis not present

## 2022-10-04 DIAGNOSIS — I10 Essential (primary) hypertension: Secondary | ICD-10-CM | POA: Diagnosis not present

## 2022-10-04 DIAGNOSIS — I2510 Atherosclerotic heart disease of native coronary artery without angina pectoris: Secondary | ICD-10-CM | POA: Diagnosis not present

## 2022-10-04 DIAGNOSIS — E1121 Type 2 diabetes mellitus with diabetic nephropathy: Secondary | ICD-10-CM | POA: Diagnosis not present

## 2022-10-04 DIAGNOSIS — Z87891 Personal history of nicotine dependence: Secondary | ICD-10-CM | POA: Diagnosis not present

## 2022-10-09 DIAGNOSIS — I10 Essential (primary) hypertension: Secondary | ICD-10-CM | POA: Diagnosis not present

## 2022-10-09 DIAGNOSIS — I2510 Atherosclerotic heart disease of native coronary artery without angina pectoris: Secondary | ICD-10-CM | POA: Diagnosis not present

## 2022-10-09 DIAGNOSIS — E11319 Type 2 diabetes mellitus with unspecified diabetic retinopathy without macular edema: Secondary | ICD-10-CM | POA: Diagnosis not present

## 2022-10-09 DIAGNOSIS — E7849 Other hyperlipidemia: Secondary | ICD-10-CM | POA: Diagnosis not present

## 2022-10-09 DIAGNOSIS — Z87891 Personal history of nicotine dependence: Secondary | ICD-10-CM | POA: Diagnosis not present

## 2022-10-09 DIAGNOSIS — Z7984 Long term (current) use of oral hypoglycemic drugs: Secondary | ICD-10-CM | POA: Diagnosis not present

## 2022-10-09 DIAGNOSIS — Z8673 Personal history of transient ischemic attack (TIA), and cerebral infarction without residual deficits: Secondary | ICD-10-CM | POA: Diagnosis not present

## 2022-10-09 DIAGNOSIS — J4540 Moderate persistent asthma, uncomplicated: Secondary | ICD-10-CM | POA: Diagnosis not present

## 2022-10-09 DIAGNOSIS — E1121 Type 2 diabetes mellitus with diabetic nephropathy: Secondary | ICD-10-CM | POA: Diagnosis not present

## 2022-10-09 DIAGNOSIS — E1143 Type 2 diabetes mellitus with diabetic autonomic (poly)neuropathy: Secondary | ICD-10-CM | POA: Diagnosis not present

## 2022-11-15 DIAGNOSIS — E1143 Type 2 diabetes mellitus with diabetic autonomic (poly)neuropathy: Secondary | ICD-10-CM | POA: Diagnosis not present

## 2022-11-15 DIAGNOSIS — Z8673 Personal history of transient ischemic attack (TIA), and cerebral infarction without residual deficits: Secondary | ICD-10-CM | POA: Diagnosis not present

## 2022-11-15 DIAGNOSIS — Z87891 Personal history of nicotine dependence: Secondary | ICD-10-CM | POA: Diagnosis not present

## 2022-11-15 DIAGNOSIS — Z7984 Long term (current) use of oral hypoglycemic drugs: Secondary | ICD-10-CM | POA: Diagnosis not present

## 2022-11-15 DIAGNOSIS — E7849 Other hyperlipidemia: Secondary | ICD-10-CM | POA: Diagnosis not present

## 2022-11-15 DIAGNOSIS — J4540 Moderate persistent asthma, uncomplicated: Secondary | ICD-10-CM | POA: Diagnosis not present

## 2022-11-15 DIAGNOSIS — E1121 Type 2 diabetes mellitus with diabetic nephropathy: Secondary | ICD-10-CM | POA: Diagnosis not present

## 2022-11-15 DIAGNOSIS — I2510 Atherosclerotic heart disease of native coronary artery without angina pectoris: Secondary | ICD-10-CM | POA: Diagnosis not present

## 2022-11-15 DIAGNOSIS — E11319 Type 2 diabetes mellitus with unspecified diabetic retinopathy without macular edema: Secondary | ICD-10-CM | POA: Diagnosis not present

## 2022-11-15 DIAGNOSIS — I10 Essential (primary) hypertension: Secondary | ICD-10-CM | POA: Diagnosis not present

## 2023-01-14 DIAGNOSIS — I200 Unstable angina: Secondary | ICD-10-CM | POA: Diagnosis not present

## 2023-01-14 DIAGNOSIS — I517 Cardiomegaly: Secondary | ICD-10-CM | POA: Diagnosis not present

## 2023-01-14 DIAGNOSIS — Z7984 Long term (current) use of oral hypoglycemic drugs: Secondary | ICD-10-CM | POA: Diagnosis not present

## 2023-01-14 DIAGNOSIS — I10 Essential (primary) hypertension: Secondary | ICD-10-CM | POA: Diagnosis not present

## 2023-01-14 DIAGNOSIS — E1159 Type 2 diabetes mellitus with other circulatory complications: Secondary | ICD-10-CM | POA: Diagnosis not present

## 2023-01-14 DIAGNOSIS — R0602 Shortness of breath: Secondary | ICD-10-CM | POA: Diagnosis not present

## 2023-01-14 DIAGNOSIS — E119 Type 2 diabetes mellitus without complications: Secondary | ICD-10-CM | POA: Diagnosis not present

## 2023-01-14 DIAGNOSIS — I77810 Thoracic aortic ectasia: Secondary | ICD-10-CM | POA: Diagnosis not present

## 2023-01-14 DIAGNOSIS — I2510 Atherosclerotic heart disease of native coronary artery without angina pectoris: Secondary | ICD-10-CM | POA: Diagnosis not present

## 2023-01-14 DIAGNOSIS — Z881 Allergy status to other antibiotic agents status: Secondary | ICD-10-CM | POA: Diagnosis not present

## 2023-01-14 DIAGNOSIS — R55 Syncope and collapse: Secondary | ICD-10-CM | POA: Diagnosis not present

## 2023-01-14 DIAGNOSIS — I440 Atrioventricular block, first degree: Secondary | ICD-10-CM | POA: Diagnosis not present

## 2023-01-14 DIAGNOSIS — Z8673 Personal history of transient ischemic attack (TIA), and cerebral infarction without residual deficits: Secondary | ICD-10-CM | POA: Diagnosis not present

## 2023-01-14 DIAGNOSIS — Z7902 Long term (current) use of antithrombotics/antiplatelets: Secondary | ICD-10-CM | POA: Diagnosis not present

## 2023-01-14 DIAGNOSIS — R0789 Other chest pain: Secondary | ICD-10-CM | POA: Diagnosis not present

## 2023-01-14 DIAGNOSIS — J449 Chronic obstructive pulmonary disease, unspecified: Secondary | ICD-10-CM | POA: Diagnosis not present

## 2023-01-14 DIAGNOSIS — Z743 Need for continuous supervision: Secondary | ICD-10-CM | POA: Diagnosis not present

## 2023-01-14 DIAGNOSIS — M549 Dorsalgia, unspecified: Secondary | ICD-10-CM | POA: Diagnosis not present

## 2023-01-14 DIAGNOSIS — K8020 Calculus of gallbladder without cholecystitis without obstruction: Secondary | ICD-10-CM | POA: Diagnosis not present

## 2023-01-14 DIAGNOSIS — Z96653 Presence of artificial knee joint, bilateral: Secondary | ICD-10-CM | POA: Diagnosis not present

## 2023-01-14 DIAGNOSIS — I249 Acute ischemic heart disease, unspecified: Secondary | ICD-10-CM | POA: Diagnosis not present

## 2023-01-14 DIAGNOSIS — R918 Other nonspecific abnormal finding of lung field: Secondary | ICD-10-CM | POA: Diagnosis not present

## 2023-01-14 DIAGNOSIS — I639 Cerebral infarction, unspecified: Secondary | ICD-10-CM | POA: Diagnosis not present

## 2023-01-14 DIAGNOSIS — R0600 Dyspnea, unspecified: Secondary | ICD-10-CM | POA: Diagnosis not present

## 2023-01-14 DIAGNOSIS — Z7982 Long term (current) use of aspirin: Secondary | ICD-10-CM | POA: Diagnosis not present

## 2023-01-14 DIAGNOSIS — R0609 Other forms of dyspnea: Secondary | ICD-10-CM | POA: Diagnosis not present

## 2023-01-15 DIAGNOSIS — R55 Syncope and collapse: Secondary | ICD-10-CM | POA: Diagnosis not present

## 2023-01-15 DIAGNOSIS — I249 Acute ischemic heart disease, unspecified: Secondary | ICD-10-CM | POA: Diagnosis not present

## 2023-01-23 DIAGNOSIS — Z8673 Personal history of transient ischemic attack (TIA), and cerebral infarction without residual deficits: Secondary | ICD-10-CM | POA: Diagnosis not present

## 2023-01-23 DIAGNOSIS — B351 Tinea unguium: Secondary | ICD-10-CM | POA: Diagnosis not present

## 2023-01-23 DIAGNOSIS — J4540 Moderate persistent asthma, uncomplicated: Secondary | ICD-10-CM | POA: Diagnosis not present

## 2023-01-23 DIAGNOSIS — E7849 Other hyperlipidemia: Secondary | ICD-10-CM | POA: Diagnosis not present

## 2023-01-23 DIAGNOSIS — D369 Benign neoplasm, unspecified site: Secondary | ICD-10-CM | POA: Diagnosis not present

## 2023-01-23 DIAGNOSIS — E1143 Type 2 diabetes mellitus with diabetic autonomic (poly)neuropathy: Secondary | ICD-10-CM | POA: Diagnosis not present

## 2023-01-23 DIAGNOSIS — I10 Essential (primary) hypertension: Secondary | ICD-10-CM | POA: Diagnosis not present

## 2023-02-18 DIAGNOSIS — I200 Unstable angina: Secondary | ICD-10-CM | POA: Diagnosis not present

## 2023-02-18 DIAGNOSIS — I5033 Acute on chronic diastolic (congestive) heart failure: Secondary | ICD-10-CM | POA: Diagnosis not present

## 2023-02-27 DIAGNOSIS — Z8673 Personal history of transient ischemic attack (TIA), and cerebral infarction without residual deficits: Secondary | ICD-10-CM | POA: Diagnosis not present

## 2023-02-27 DIAGNOSIS — R7989 Other specified abnormal findings of blood chemistry: Secondary | ICD-10-CM | POA: Diagnosis not present

## 2023-02-27 DIAGNOSIS — I5030 Unspecified diastolic (congestive) heart failure: Secondary | ICD-10-CM | POA: Diagnosis not present

## 2023-02-27 DIAGNOSIS — I4430 Unspecified atrioventricular block: Secondary | ICD-10-CM | POA: Diagnosis not present

## 2023-02-27 DIAGNOSIS — E119 Type 2 diabetes mellitus without complications: Secondary | ICD-10-CM | POA: Diagnosis not present

## 2023-02-27 DIAGNOSIS — R531 Weakness: Secondary | ICD-10-CM | POA: Diagnosis not present

## 2023-02-27 DIAGNOSIS — I110 Hypertensive heart disease with heart failure: Secondary | ICD-10-CM | POA: Diagnosis not present

## 2023-02-27 DIAGNOSIS — Z743 Need for continuous supervision: Secondary | ICD-10-CM | POA: Diagnosis not present

## 2023-02-27 DIAGNOSIS — Z7984 Long term (current) use of oral hypoglycemic drugs: Secondary | ICD-10-CM | POA: Diagnosis not present

## 2023-02-27 DIAGNOSIS — Z1152 Encounter for screening for COVID-19: Secondary | ICD-10-CM | POA: Diagnosis not present

## 2023-02-27 DIAGNOSIS — R6889 Other general symptoms and signs: Secondary | ICD-10-CM | POA: Diagnosis not present

## 2023-02-27 DIAGNOSIS — Z7902 Long term (current) use of antithrombotics/antiplatelets: Secondary | ICD-10-CM | POA: Diagnosis not present

## 2023-02-27 DIAGNOSIS — N390 Urinary tract infection, site not specified: Secondary | ICD-10-CM | POA: Diagnosis not present

## 2023-02-27 DIAGNOSIS — A419 Sepsis, unspecified organism: Secondary | ICD-10-CM | POA: Diagnosis not present

## 2023-02-27 DIAGNOSIS — I10 Essential (primary) hypertension: Secondary | ICD-10-CM | POA: Diagnosis not present

## 2023-02-27 DIAGNOSIS — J449 Chronic obstructive pulmonary disease, unspecified: Secondary | ICD-10-CM | POA: Diagnosis not present

## 2023-02-27 DIAGNOSIS — Z79899 Other long term (current) drug therapy: Secondary | ICD-10-CM | POA: Diagnosis not present

## 2023-02-27 DIAGNOSIS — Z7901 Long term (current) use of anticoagulants: Secondary | ICD-10-CM | POA: Diagnosis not present

## 2023-02-28 DIAGNOSIS — G459 Transient cerebral ischemic attack, unspecified: Secondary | ICD-10-CM | POA: Diagnosis not present

## 2023-02-28 DIAGNOSIS — E119 Type 2 diabetes mellitus without complications: Secondary | ICD-10-CM | POA: Diagnosis not present

## 2023-02-28 DIAGNOSIS — N3001 Acute cystitis with hematuria: Secondary | ICD-10-CM | POA: Diagnosis not present

## 2023-02-28 DIAGNOSIS — E785 Hyperlipidemia, unspecified: Secondary | ICD-10-CM | POA: Diagnosis not present

## 2023-02-28 DIAGNOSIS — M5136 Other intervertebral disc degeneration, lumbar region: Secondary | ICD-10-CM | POA: Diagnosis not present

## 2023-02-28 DIAGNOSIS — I10 Essential (primary) hypertension: Secondary | ICD-10-CM | POA: Diagnosis not present

## 2023-02-28 DIAGNOSIS — J449 Chronic obstructive pulmonary disease, unspecified: Secondary | ICD-10-CM | POA: Diagnosis not present

## 2023-02-28 DIAGNOSIS — R54 Age-related physical debility: Secondary | ICD-10-CM | POA: Diagnosis not present

## 2023-03-21 DIAGNOSIS — I10 Essential (primary) hypertension: Secondary | ICD-10-CM | POA: Diagnosis not present

## 2023-03-21 DIAGNOSIS — R001 Bradycardia, unspecified: Secondary | ICD-10-CM | POA: Diagnosis not present

## 2023-03-28 DIAGNOSIS — I454 Nonspecific intraventricular block: Secondary | ICD-10-CM | POA: Diagnosis not present

## 2023-03-28 DIAGNOSIS — I495 Sick sinus syndrome: Secondary | ICD-10-CM | POA: Diagnosis not present

## 2023-03-28 DIAGNOSIS — I440 Atrioventricular block, first degree: Secondary | ICD-10-CM | POA: Diagnosis not present

## 2023-03-28 DIAGNOSIS — I441 Atrioventricular block, second degree: Secondary | ICD-10-CM | POA: Diagnosis not present

## 2023-04-16 DIAGNOSIS — N1832 Chronic kidney disease, stage 3b: Secondary | ICD-10-CM | POA: Diagnosis not present

## 2023-04-16 DIAGNOSIS — D369 Benign neoplasm, unspecified site: Secondary | ICD-10-CM | POA: Diagnosis not present

## 2023-04-16 DIAGNOSIS — Z8673 Personal history of transient ischemic attack (TIA), and cerebral infarction without residual deficits: Secondary | ICD-10-CM | POA: Diagnosis not present

## 2023-04-16 DIAGNOSIS — B351 Tinea unguium: Secondary | ICD-10-CM | POA: Diagnosis not present

## 2023-04-16 DIAGNOSIS — I10 Essential (primary) hypertension: Secondary | ICD-10-CM | POA: Diagnosis not present

## 2023-04-16 DIAGNOSIS — C44621 Squamous cell carcinoma of skin of unspecified upper limb, including shoulder: Secondary | ICD-10-CM | POA: Diagnosis not present

## 2023-04-16 DIAGNOSIS — J4540 Moderate persistent asthma, uncomplicated: Secondary | ICD-10-CM | POA: Diagnosis not present

## 2023-04-16 DIAGNOSIS — E1143 Type 2 diabetes mellitus with diabetic autonomic (poly)neuropathy: Secondary | ICD-10-CM | POA: Diagnosis not present

## 2023-04-16 DIAGNOSIS — Z0000 Encounter for general adult medical examination without abnormal findings: Secondary | ICD-10-CM | POA: Diagnosis not present

## 2023-04-18 DIAGNOSIS — M1612 Unilateral primary osteoarthritis, left hip: Secondary | ICD-10-CM | POA: Diagnosis not present

## 2023-04-18 DIAGNOSIS — M419 Scoliosis, unspecified: Secondary | ICD-10-CM | POA: Diagnosis not present

## 2023-04-18 DIAGNOSIS — M47814 Spondylosis without myelopathy or radiculopathy, thoracic region: Secondary | ICD-10-CM | POA: Diagnosis not present

## 2023-04-18 DIAGNOSIS — M4310 Spondylolisthesis, site unspecified: Secondary | ICD-10-CM | POA: Diagnosis not present

## 2023-04-18 DIAGNOSIS — Z981 Arthrodesis status: Secondary | ICD-10-CM | POA: Diagnosis not present

## 2023-04-18 DIAGNOSIS — M25552 Pain in left hip: Secondary | ICD-10-CM | POA: Diagnosis not present

## 2023-04-18 DIAGNOSIS — M5136 Other intervertebral disc degeneration, lumbar region with discogenic back pain only: Secondary | ICD-10-CM | POA: Diagnosis not present

## 2023-04-18 DIAGNOSIS — M48061 Spinal stenosis, lumbar region without neurogenic claudication: Secondary | ICD-10-CM | POA: Diagnosis not present

## 2023-04-18 DIAGNOSIS — M545 Low back pain, unspecified: Secondary | ICD-10-CM | POA: Diagnosis not present

## 2023-04-24 DIAGNOSIS — Z8673 Personal history of transient ischemic attack (TIA), and cerebral infarction without residual deficits: Secondary | ICD-10-CM | POA: Diagnosis not present

## 2023-04-24 DIAGNOSIS — I10 Essential (primary) hypertension: Secondary | ICD-10-CM | POA: Diagnosis not present

## 2023-04-24 DIAGNOSIS — K5900 Constipation, unspecified: Secondary | ICD-10-CM | POA: Diagnosis not present

## 2023-04-24 DIAGNOSIS — Z79899 Other long term (current) drug therapy: Secondary | ICD-10-CM | POA: Diagnosis not present

## 2023-04-24 DIAGNOSIS — Z743 Need for continuous supervision: Secondary | ICD-10-CM | POA: Diagnosis not present

## 2023-04-24 DIAGNOSIS — Z7902 Long term (current) use of antithrombotics/antiplatelets: Secondary | ICD-10-CM | POA: Diagnosis not present

## 2023-04-24 DIAGNOSIS — I6523 Occlusion and stenosis of bilateral carotid arteries: Secondary | ICD-10-CM | POA: Diagnosis not present

## 2023-04-24 DIAGNOSIS — E119 Type 2 diabetes mellitus without complications: Secondary | ICD-10-CM | POA: Diagnosis not present

## 2023-04-24 DIAGNOSIS — R404 Transient alteration of awareness: Secondary | ICD-10-CM | POA: Diagnosis not present

## 2023-04-24 DIAGNOSIS — R55 Syncope and collapse: Secondary | ICD-10-CM | POA: Diagnosis not present

## 2023-04-24 DIAGNOSIS — I499 Cardiac arrhythmia, unspecified: Secondary | ICD-10-CM | POA: Diagnosis not present

## 2023-04-24 DIAGNOSIS — Z881 Allergy status to other antibiotic agents status: Secondary | ICD-10-CM | POA: Diagnosis not present

## 2023-04-24 DIAGNOSIS — J449 Chronic obstructive pulmonary disease, unspecified: Secondary | ICD-10-CM | POA: Diagnosis not present

## 2023-04-24 DIAGNOSIS — R9082 White matter disease, unspecified: Secondary | ICD-10-CM | POA: Diagnosis not present

## 2023-04-24 DIAGNOSIS — Z7984 Long term (current) use of oral hypoglycemic drugs: Secondary | ICD-10-CM | POA: Diagnosis not present

## 2023-05-20 DIAGNOSIS — I5033 Acute on chronic diastolic (congestive) heart failure: Secondary | ICD-10-CM | POA: Diagnosis not present

## 2023-05-20 DIAGNOSIS — I200 Unstable angina: Secondary | ICD-10-CM | POA: Diagnosis not present

## 2023-06-19 DIAGNOSIS — Z780 Asymptomatic menopausal state: Secondary | ICD-10-CM | POA: Diagnosis not present

## 2023-06-19 DIAGNOSIS — M810 Age-related osteoporosis without current pathological fracture: Secondary | ICD-10-CM | POA: Diagnosis not present

## 2023-07-04 DIAGNOSIS — E119 Type 2 diabetes mellitus without complications: Secondary | ICD-10-CM | POA: Diagnosis not present

## 2023-07-04 DIAGNOSIS — I2510 Atherosclerotic heart disease of native coronary artery without angina pectoris: Secondary | ICD-10-CM | POA: Diagnosis not present

## 2023-07-04 DIAGNOSIS — R079 Chest pain, unspecified: Secondary | ICD-10-CM | POA: Diagnosis not present

## 2023-07-04 DIAGNOSIS — I5033 Acute on chronic diastolic (congestive) heart failure: Secondary | ICD-10-CM | POA: Diagnosis not present

## 2023-07-04 DIAGNOSIS — R0609 Other forms of dyspnea: Secondary | ICD-10-CM | POA: Diagnosis not present

## 2023-07-04 DIAGNOSIS — J449 Chronic obstructive pulmonary disease, unspecified: Secondary | ICD-10-CM | POA: Diagnosis not present

## 2023-08-08 DIAGNOSIS — C44722 Squamous cell carcinoma of skin of right lower limb, including hip: Secondary | ICD-10-CM | POA: Diagnosis not present

## 2023-08-08 DIAGNOSIS — D485 Neoplasm of uncertain behavior of skin: Secondary | ICD-10-CM | POA: Diagnosis not present

## 2023-08-08 DIAGNOSIS — C44729 Squamous cell carcinoma of skin of left lower limb, including hip: Secondary | ICD-10-CM | POA: Diagnosis not present

## 2023-09-02 DIAGNOSIS — C44729 Squamous cell carcinoma of skin of left lower limb, including hip: Secondary | ICD-10-CM | POA: Diagnosis not present

## 2023-09-10 DIAGNOSIS — Z48817 Encounter for surgical aftercare following surgery on the skin and subcutaneous tissue: Secondary | ICD-10-CM | POA: Diagnosis not present

## 2023-09-16 DIAGNOSIS — C44722 Squamous cell carcinoma of skin of right lower limb, including hip: Secondary | ICD-10-CM | POA: Diagnosis not present

## 2023-09-22 DIAGNOSIS — Z4801 Encounter for change or removal of surgical wound dressing: Secondary | ICD-10-CM | POA: Diagnosis not present

## 2023-09-30 DIAGNOSIS — Z4801 Encounter for change or removal of surgical wound dressing: Secondary | ICD-10-CM | POA: Diagnosis not present

## 2023-10-07 DIAGNOSIS — Z48817 Encounter for surgical aftercare following surgery on the skin and subcutaneous tissue: Secondary | ICD-10-CM | POA: Diagnosis not present

## 2023-10-07 DIAGNOSIS — S81802A Unspecified open wound, left lower leg, initial encounter: Secondary | ICD-10-CM | POA: Diagnosis not present

## 2023-10-29 DIAGNOSIS — Z48817 Encounter for surgical aftercare following surgery on the skin and subcutaneous tissue: Secondary | ICD-10-CM | POA: Diagnosis not present

## 2023-10-29 DIAGNOSIS — S81802A Unspecified open wound, left lower leg, initial encounter: Secondary | ICD-10-CM | POA: Diagnosis not present

## 2023-11-05 DIAGNOSIS — E1122 Type 2 diabetes mellitus with diabetic chronic kidney disease: Secondary | ICD-10-CM | POA: Diagnosis not present

## 2023-11-05 DIAGNOSIS — J4540 Moderate persistent asthma, uncomplicated: Secondary | ICD-10-CM | POA: Diagnosis not present

## 2023-11-05 DIAGNOSIS — E7849 Other hyperlipidemia: Secondary | ICD-10-CM | POA: Diagnosis not present

## 2023-11-05 DIAGNOSIS — D369 Benign neoplasm, unspecified site: Secondary | ICD-10-CM | POA: Diagnosis not present

## 2023-11-05 DIAGNOSIS — Z8673 Personal history of transient ischemic attack (TIA), and cerebral infarction without residual deficits: Secondary | ICD-10-CM | POA: Diagnosis not present

## 2023-11-05 DIAGNOSIS — L578 Other skin changes due to chronic exposure to nonionizing radiation: Secondary | ICD-10-CM | POA: Diagnosis not present

## 2023-11-05 DIAGNOSIS — I10 Essential (primary) hypertension: Secondary | ICD-10-CM | POA: Diagnosis not present

## 2023-11-05 DIAGNOSIS — Z85828 Personal history of other malignant neoplasm of skin: Secondary | ICD-10-CM | POA: Diagnosis not present

## 2023-11-05 DIAGNOSIS — D485 Neoplasm of uncertain behavior of skin: Secondary | ICD-10-CM | POA: Diagnosis not present

## 2023-11-05 DIAGNOSIS — E1143 Type 2 diabetes mellitus with diabetic autonomic (poly)neuropathy: Secondary | ICD-10-CM | POA: Diagnosis not present

## 2023-11-05 DIAGNOSIS — Z08 Encounter for follow-up examination after completed treatment for malignant neoplasm: Secondary | ICD-10-CM | POA: Diagnosis not present

## 2023-11-05 DIAGNOSIS — N1832 Chronic kidney disease, stage 3b: Secondary | ICD-10-CM | POA: Diagnosis not present

## 2023-11-05 DIAGNOSIS — L570 Actinic keratosis: Secondary | ICD-10-CM | POA: Diagnosis not present

## 2023-11-17 DIAGNOSIS — C4359 Malignant melanoma of other part of trunk: Secondary | ICD-10-CM | POA: Diagnosis not present

## 2023-11-20 ENCOUNTER — Other Ambulatory Visit: Payer: Self-pay | Admitting: General Surgery

## 2023-11-21 ENCOUNTER — Other Ambulatory Visit (HOSPITAL_COMMUNITY): Payer: Self-pay | Admitting: General Surgery

## 2023-11-21 DIAGNOSIS — C4359 Malignant melanoma of other part of trunk: Secondary | ICD-10-CM

## 2023-11-27 ENCOUNTER — Ambulatory Visit (HOSPITAL_COMMUNITY)
Admission: RE | Admit: 2023-11-27 | Discharge: 2023-11-27 | Disposition: A | Discharge status: Home / Self Care | Source: Ambulatory Visit | Attending: General Surgery | Admitting: General Surgery

## 2023-11-27 DIAGNOSIS — C4359 Malignant melanoma of other part of trunk: Secondary | ICD-10-CM

## 2023-11-27 DIAGNOSIS — C773 Secondary and unspecified malignant neoplasm of axilla and upper limb lymph nodes: Secondary | ICD-10-CM | POA: Diagnosis not present

## 2023-11-27 MED ORDER — FLUDEOXYGLUCOSE F - 18 (FDG) INJECTION
10.4800 | Freq: Once | INTRAVENOUS | Status: AC | PRN
  Administered 2023-11-27: 10.48 via INTRAVENOUS

## 2023-12-04 ENCOUNTER — Other Ambulatory Visit (HOSPITAL_COMMUNITY): Payer: Self-pay | Admitting: General Surgery

## 2023-12-04 DIAGNOSIS — C4359 Malignant melanoma of other part of trunk: Secondary | ICD-10-CM

## 2023-12-05 NOTE — Progress Notes (Signed)
 Luverne Aran, MD  Michaelene Setter PROCEDURE / BIOPSY REVIEW Date: 12/04/23  Requested Biopsy site: Left inguinal lymph node mass Reason for request: Metastatic LN Imaging review: Best seen on PET  Decision: Approved Imaging modality to perform: Ultrasound Schedule with: No sedation / Local anesthetic Schedule for: Any VIR  Additional comments:   Please contact me with questions, concerns, or if issue pertaining to this request arise.  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

## 2023-12-09 DIAGNOSIS — Z48817 Encounter for surgical aftercare following surgery on the skin and subcutaneous tissue: Secondary | ICD-10-CM | POA: Diagnosis not present

## 2023-12-09 DIAGNOSIS — C44629 Squamous cell carcinoma of skin of left upper limb, including shoulder: Secondary | ICD-10-CM | POA: Diagnosis not present

## 2023-12-19 DIAGNOSIS — M5459 Other low back pain: Secondary | ICD-10-CM | POA: Diagnosis not present

## 2023-12-19 DIAGNOSIS — R590 Localized enlarged lymph nodes: Secondary | ICD-10-CM | POA: Diagnosis not present

## 2023-12-19 DIAGNOSIS — C4359 Malignant melanoma of other part of trunk: Secondary | ICD-10-CM | POA: Diagnosis not present

## 2023-12-23 ENCOUNTER — Other Ambulatory Visit: Payer: Self-pay | Admitting: Radiology

## 2023-12-23 DIAGNOSIS — R7309 Other abnormal glucose: Secondary | ICD-10-CM

## 2023-12-24 ENCOUNTER — Ambulatory Visit (HOSPITAL_COMMUNITY)
Admission: RE | Admit: 2023-12-24 | Discharge: 2023-12-24 | Disposition: A | Discharge status: Home / Self Care | Source: Ambulatory Visit | Attending: General Surgery | Admitting: General Surgery

## 2023-12-24 DIAGNOSIS — R599 Enlarged lymph nodes, unspecified: Secondary | ICD-10-CM | POA: Diagnosis not present

## 2023-12-24 DIAGNOSIS — C801 Malignant (primary) neoplasm, unspecified: Secondary | ICD-10-CM | POA: Diagnosis not present

## 2023-12-24 DIAGNOSIS — C774 Secondary and unspecified malignant neoplasm of inguinal and lower limb lymph nodes: Secondary | ICD-10-CM | POA: Insufficient documentation

## 2023-12-24 DIAGNOSIS — C4359 Malignant melanoma of other part of trunk: Secondary | ICD-10-CM | POA: Insufficient documentation

## 2023-12-24 MED ORDER — LIDOCAINE HCL (PF) 1 % IJ SOLN
15.0000 mL | Freq: Once | INTRAMUSCULAR | Status: AC
  Administered 2023-12-24: 15 mL via INTRADERMAL

## 2023-12-24 NOTE — Procedures (Addendum)
 Interventional Radiology Procedure:   Indications: Melanoma with enlarged left inguinal lymph node  Procedure: US  guided left inguinal lymph node biopsy  Findings: Enlarged left inguinal lymph node.  4 core biopsies obtained and specimens placed in saline  Complications: None     EBL: Minimal  Plan: Discharge to home.   Ashtyn Meland R. Philip, MD  Pager: [phone]

## 2024-01-01 DIAGNOSIS — I10 Essential (primary) hypertension: Secondary | ICD-10-CM | POA: Diagnosis not present

## 2024-01-01 DIAGNOSIS — E785 Hyperlipidemia, unspecified: Secondary | ICD-10-CM | POA: Diagnosis not present

## 2024-01-01 DIAGNOSIS — G459 Transient cerebral ischemic attack, unspecified: Secondary | ICD-10-CM | POA: Diagnosis not present

## 2024-01-05 LAB — SURGICAL PATHOLOGY

## 2024-01-07 ENCOUNTER — Telehealth: Payer: Self-pay | Admitting: Hematology

## 2024-01-07 ENCOUNTER — Telehealth: Payer: Self-pay

## 2024-01-07 DIAGNOSIS — C44629 Squamous cell carcinoma of skin of left upper limb, including shoulder: Secondary | ICD-10-CM | POA: Diagnosis not present

## 2024-01-07 NOTE — Telephone Encounter (Signed)
 Called and left VM for the patient to call back to scheduled an appointment.

## 2024-01-19 NOTE — Progress Notes (Signed)
 [facility] CONSULT NOTE  Patient Care Team: Orpha Yancey LABOR, MD as PCP - General (Unknown Physician Specialty) Charls Pearla LABOR, MD (Inactive) as Attending Physician (Cardiology) Gaither Anes, MD as Attending Physician (Neurosurgery)  ASSESSMENT & PLAN:  [name] is a 83 y.o.female with history of melanoma, cSCC, HTN, HLD, DJD, CAD, HFpEF, arthritis being seen [facility] for abnormal LAD and biopsy.  Case discussed at tumor board. PET showed left inguinal and axillary avid nodes. Left inguinal LN biopsy reported can be SCC or UC. She has left arm lesion was found to be SCC, with positive margins. The left flank/chest lesion was found to be a nodular melanoma with positive margins (at least 4.41mm depth, 8 mitoses, + ulceration) which is T4b. Given no focal finding on in the GU systemic on the PET, the inguinal node could be of cutaneous origin. However, cannot conclude the axillary avid node as it could either be melanoma or SCC. Discussed sending for TO on the inguinal node and biopsy of axillary node.  Additional testing and biopsy confirmation will give better staging of each disease.  We also discussed goals of care today.  She has limited PS at baseline.  Daughters are with her today and providing activities of daily care.  We discussed that immunotherapy may result in response, and potential long-term progression free survival.  However, potential side effects from immunotherapy can occur.  Given her limited performance status, and is unclear if she may or may not have more side effects.  There are potential side effects that can be life-threatening.  Patient will also need constant repetitive office visit, lab visit, infusion room visit, potential need for hospitalization, emergency room visit if developing severe side effects or complications from the cancer itself.  Alternatively, patient may elect palliative approach with hospice to focus on comfort measure when  disease progresses in patients with limited PS.  After discussion, she and her daughters expressed would like to trial treatment to see how she responds.  We will proceed with additional biopsy and testing before starting systemic treatment in this case. Assessment & Plan Melanoma of skin (HCC) Biopsy of left axillary lymph node ordered. CBC, CMP, LDH New staging scan before systemic treatment. Lymphadenopathy Biopsy of left axillary lymph node ordered. 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 Left inguinal lymph node biopsy likely is related to cutaneous squamous cell carcinoma.  However, IHC cannot delineate tumor origin.  Will obtain TO.  Orders Placed This Encounter  Procedures   US  CORE BIOPSY (LYMPH NODES)    Standing Status:   Future    Expected Date:   02/03/2024    Expiration Date:   01/19/2025    Lab orders requested (DO NOT place separate lab orders, these will be automatically ordered during procedure specimen collection)::   Surgical Pathology    Reason for Exam (SYMPTOM  OR DIAGNOSIS REQUIRED):   left axillary lymph node with melanona and SCC needs diagnosis on which cancer it is    Preferred location?:   [facility]   CBC with Differential (Cancer Center Only)    Standing Status:   Future    Number of Occurrences:   1    Expiration Date:   01/19/2025   CMP (Cancer Center only)    Standing Status:   Future    Number of Occurrences:   1    Expiration Date:  01/19/2025   Lactate dehydrogenase    Standing Status:   Future    Number of Occurrences:   1    Expiration Date:   01/19/2025   Ferritin    Standing Status:   Future    Number of Occurrences:   1    Expiration Date:   01/19/2025   Vitamin B12    Standing Status:   Future    Number of Occurrences:   1    Expiration Date:   01/19/2025   Ferritin    Standing Status:   Future    Expiration  Date:   01/19/2025   CBC with Differential (Cancer Center Only)    Standing Status:   Future    Expected Date:   02/12/2024    Expiration Date:   02/11/2025   CMP (Cancer Center only)    Standing Status:   Future    Expected Date:   02/12/2024    Expiration Date:   02/11/2025   T4, free    Standing Status:   Future    Expected Date:   02/12/2024    Expiration Date:   02/11/2025   TSH    Standing Status:   Future    Expected Date:   02/12/2024    Expiration Date:   02/11/2025    The total time spent in the appointment was 71 minutes encounter with patients including review of chart and various tests results, discussions about plan of care and coordination of care plan  All questions were answered. The patient knows to call the clinic with any problems, questions or concerns. No barriers to learning was detected.  Pauletta JAYSON Chihuahua, MD 8/14/20251:24 PM  CHIEF COMPLAINTS/PURPOSE OF CONSULTATION:  SCC and melanoma  HISTORY OF PRESENTING ILLNESS:  [name] 83 y.o. female is here because of SCC and melanoma.  Patient is with 2 daughters today.  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  More recently she has cutaneous SCC involved lower extremities.  Last surgery was left lower extremity and reported for her PET scan.  It was performed by dermatologist.  She also has melanoma reported felt by PCP initially.  We do not have records of this.  Notes from surgery reported left flank/chest lesion was found to be nodular melanoma with positive margin.,  At least 4.6 mm depth, 8 mitoses and positive ulceration.  Patient was referred for PET/CT and followed avid left inguinal lymphadenopathy, and avid left axillary lymphadenopathy.  No other clear sites of disease.  She underwent biopsy of left inguinal and reported squamous cell carcinoma.  Origin cannot be further delineated.  Her case was presented at tumor board.  She is here for evaluation after tumor board  discussion.   MEDICAL HISTORY:  Past Medical History:  Diagnosis Date   Aortic stenosis    Carpal tunnel syndrome, right    COPD (chronic obstructive pulmonary disease) (HCC)    DJD (degenerative joint disease)    Eczematous dermatitis    Essential hypertension    Gallstones    Hyperlipidemia    Osteoarthritis    Rotator cuff syndrome    Spinal stenosis of lumbar region    Squamous cell carcinoma    Stroke (HCC)    Type 2 diabetes mellitus (HCC)     SURGICAL HISTORY: Past Surgical History:  Procedure Laterality Date   BACK SURGERY     CATARACT EXTRACTION  2011   bilateral   JOINT REPLACEMENT  09-27-11   '  04/ right with resection for infection, now antibiotic spacer planned   JOINT REPLACEMENT     not replaced- only surgery 2007/2011/2012.   LUMBAR LAMINECTOMY/DECOMPRESSION MICRODISCECTOMY  04/19/2011   Procedure: LUMBAR LAMINECTOMY/DECOMPRESSION MICRODISCECTOMY;  Surgeon: Tanda DELENA Heading;  Location: [facility];  Service: Orthopedics;  Laterality: N/A;  Central decompression Lumbar two to lumbar three and Three to Lumbar Four    (xray)   TOTAL KNEE REVISION  12/04/2011   Procedure: TOTAL KNEE REVISION;  Surgeon: Dempsey LULLA Moan, MD;  Location: [facility];  Service: Orthopedics;  Laterality: Right;  Reimplantation of a Right Total Knee Arthroplasty   TUBAL LIGATION      SOCIAL HISTORY: Social History   Socioeconomic History   Marital status: Married    Spouse name: Keven   Number of children: 3   Years of education: 12   Highest education level: Not on file  Occupational History   Occupation: Retired    Comment: [employer]  Tobacco Use   Smoking status: Never   Smokeless tobacco: Never  Substance and Sexual Activity   Alcohol use: No   Drug use: No   Sexual activity: Yes  Other Topics Concern   Not on file  Social History Narrative   Lives with husband. Retired, worked in a [employer]. Does not regularly exercise.    Caffeine Use: 2 12oz sodas daily   Social  Drivers of Corporate investment banker Strain: Low Risk  (01/15/2023)   Received from [facility]   Overall Financial Resource Strain (CARDIA)    Difficulty of Paying Living Expenses: Not hard at all  Food Insecurity: No Food Insecurity (01/20/2024)   Hunger Vital Sign    Worried About Running Out of Food in the Last Year: Never true    Ran Out of Food in the Last Year: Never true  Transportation Needs: No Transportation Needs (01/20/2024)   PRAPARE - Administrator, Civil Service (Medical): No    Lack of Transportation (Non-Medical): No  Physical Activity: Inactive (05/24/2022)   Received from [facility]   Exercise Vital Sign    On average, how many days per week do you engage in moderate to strenuous exercise (like a brisk walk)?: 0 days    On average, how many minutes do you engage in exercise at this level?: 10 min  Stress: No Stress Concern Present (05/24/2022)   Received from [facility] - Occupational Stress Questionnaire    Feeling of Stress : Not at all  Social Connections: Moderately Integrated (05/24/2022)   Received from [facility]   Social Connection and Isolation Panel    Frequency of Communication with Friends and Family: Not on file    How often do you get together with friends or relatives?: More than three times a week    How often do you attend church or religious services?: More than 4 times per year    Do you belong to any clubs or organizations such as church groups, unions, fraternal or athletic groups, or school groups?: Yes    How often do you attend meetings of the clubs or organizations you belong to?: More than 4 times per year    Are you married, widowed, divorced, separated, never married, or living with a partner?: Separated  Intimate Partner Violence: Not At Risk (01/20/2024)   Humiliation, Afraid, Rape, and Kick questionnaire    Fear of Current or Ex-Partner: No  Emotionally  Abused: No    Physically Abused: No    Sexually Abused: No    FAMILY HISTORY: Family History  Problem Relation Age of Onset   Heart attack Mother    Cancer Mother        cholangiocarcinoma   Heart disease Father    Heart attack Brother    Heart attack Brother    Heart attack Brother    Coronary artery disease Other    Diabetes Other     ALLERGIES:  is allergic to tizanidine and ciprofloxacin.  MEDICATIONS:  Current Outpatient Medications  Medication Sig Dispense Refill   albuterol (VENTOLIN HFA) 108 (90 Base) MCG/ACT inhaler Inhale 1 puff into the lungs every 4 (four) hours as needed for shortness of breath.     aspirin  EC 81 MG tablet Take 81 mg by mouth daily. (Patient not taking: Reported on 10/03/2020)     benazepril -hydrochlorthiazide (LOTENSIN  HCT) 20-25 MG tablet Hold this medication until you follow up with PCP because your blood pressure was too low.     clopidogrel  (PLAVIX ) 75 MG tablet Take 75 mg by mouth daily.     Ferrous Sulfate (IRON ) 325 (65 FE) MG TABS Take 65 mg by mouth. Occasionally (Patient not taking: Reported on 10/03/2020)     fish oil-omega-3 fatty acids 1000 MG capsule Take 2 g by mouth daily.     gabapentin (NEURONTIN) 100 MG capsule Take 100 mg by mouth 2 (two) times daily.     gabapentin (NEURONTIN) 300 MG capsule Take 300 mg by mouth 3 (three) times daily. (Patient not taking: Reported on 10/03/2020)     hydrochlorothiazide  (HYDRODIURIL ) 25 MG tablet Take by mouth. (Patient not taking: Reported on 10/03/2020)     labetalol  (NORMODYNE ) 300 MG tablet Take 300 mg by mouth 2 (two) times daily.     lisinopril  (ZESTRIL ) 20 MG tablet Take by mouth. (Patient not taking: Reported on 10/03/2020)     metFORMIN (GLUCOPHAGE) 500 MG tablet Take 1 tablet by mouth 2 (two) times daily.     Multiple Vitamin (MULTIVITAMIN) tablet Take 1 tablet by mouth daily. (Patient not taking: Reported on 10/03/2020)     Multiple Vitamins-Minerals (VITAMIN D3 COMPLETE PO) Take 1 tablet by  mouth daily.     NIFEdipine (ADALAT CC) 30 MG 24 hr tablet Take by mouth. (Patient not taking: Reported on 10/03/2020)     pravastatin (PRAVACHOL) 40 MG tablet Take 40 mg by mouth daily.     sertraline (ZOLOFT) 50 MG tablet Take 50 mg by mouth daily.     tiZANidine (ZANAFLEX) 2 MG tablet Take by mouth. (Patient not taking: Reported on 10/03/2020)     vitamin B-12 (CYANOCOBALAMIN ) 100 MCG tablet Take 100 mcg by mouth daily.     vitamin C  (ASCORBIC ACID ) 500 MG tablet Take 500 mg by mouth daily.     VITAMIN E  PO Take 1 tablet by mouth daily.     No current facility-administered medications for this visit.    REVIEW OF SYSTEMS:   All relevant systems were reviewed with the patient and are negative.  PHYSICAL EXAMINATION: ECOG PERFORMANCE STATUS: 3 - Symptomatic, >50% confined to bed  Vitals:   01/20/24 1300  BP: 116/75  Pulse: 94  Resp: 20  Temp: 98.2 F (36.8 C)  SpO2: 96%   There were no vitals filed for this visit.  GENERAL: alert, no distress and comfortable on wheelchair SKIN: skin color is normal, no jaundice, a few scratch rashes over upper extremities  EYES: sclera clear LYMPH:  no palpable lymphadenopathy in the cervical or right axillary lymph node 3 cm left axillary palpable lymph node LUNGS: Effort normal, no respiratory distress.  Few crackles HEART: regular rate & rhythm and bilateral lower extremity edema ABDOMEN: soft, non-tender and nondistended NEURO: no focal motor/sensory deficits  LABORATORY DATA:  I have reviewed the data as listed Lab Results  Component Value Date   WBC 6.4 01/20/2024   HGB 11.4 (L) 01/20/2024   HCT 35.2 (L) 01/20/2024   MCV 83.4 01/20/2024   PLT 197 01/20/2024   Recent Labs    01/20/24 1425  NA 140  K 3.4*  CL 98  CO2 33*  GLUCOSE 90  BUN 11  CREATININE 0.64  CALCIUM 9.5  GFRNONAA >60  PROT 7.3  ALBUMIN 4.3  AST 15  ALT 9  ALKPHOS 86  BILITOT 0.4    RADIOGRAPHIC STUDIES: I have personally reviewed the  radiological images as listed and agreed with the findings in the report. US  CORE BIOPSY (LYMPH NODES) Result Date: 12/24/2023 INDICATION: 83 year old with melanoma. Recent PET-CT demonstrated enlarged hypermetabolic lymph nodes in the left axilla and left groin. EXAM: ULTRASOUND GUIDED CORE BIOPSY OF LEFT INGUINAL LYMPH NODE MEDICATIONS: 1% lidocaine  for local anesthetic ANESTHESIA/SEDATION: None PROCEDURE: The procedure was explained to the patient. The risks and benefits of the procedure were discussed and the patient's questions were addressed. Informed consent was obtained from the patient. Time-out was performed. Enlarged left inguinal lymph node was identified with ultrasound. Left groin was prepped with chlorhexidine  and sterile field was created. Skin was anesthetized using 1% lidocaine . Small incision was made. Using ultrasound guidance, 17 gauge coaxial needle was directed into the large lymph node. Total of 4 core biopsies were obtained with an 18 gauge core device. Specimens placed in saline. 17 gauge needle was removed without complication. Bandage placed over the puncture site. COMPLICATIONS: None immediate. FINDINGS: Large lobulated hypoechoic lymph node or mass in the left groin. The lymph node measured 5.2 x 3.2 x 3.3 cm. Biopsy needle confirmed within the lymph node. Four adequate specimens obtained. No immediate bleeding or hematoma formation. IMPRESSION: Ultrasound-guided core biopsy of the enlarged left inguinal lymph node. Electronically Signed   By: Juliene Balder M.D.   On: 12/24/2023 15:31

## 2024-01-20 ENCOUNTER — Inpatient Hospital Stay

## 2024-01-20 ENCOUNTER — Encounter: Payer: Self-pay | Admitting: *Deleted

## 2024-01-20 VITALS — BP 116/75 | HR 94 | Temp 98.2°F | Resp 20

## 2024-01-20 DIAGNOSIS — R591 Generalized enlarged lymph nodes: Secondary | ICD-10-CM

## 2024-01-20 DIAGNOSIS — D649 Anemia, unspecified: Secondary | ICD-10-CM

## 2024-01-20 DIAGNOSIS — Z85820 Personal history of malignant melanoma of skin: Secondary | ICD-10-CM | POA: Insufficient documentation

## 2024-01-20 DIAGNOSIS — C4492 Squamous cell carcinoma of skin, unspecified: Secondary | ICD-10-CM | POA: Diagnosis not present

## 2024-01-20 DIAGNOSIS — Z7984 Long term (current) use of oral hypoglycemic drugs: Secondary | ICD-10-CM | POA: Diagnosis not present

## 2024-01-20 DIAGNOSIS — C439 Malignant melanoma of skin, unspecified: Secondary | ICD-10-CM

## 2024-01-20 DIAGNOSIS — E119 Type 2 diabetes mellitus without complications: Secondary | ICD-10-CM | POA: Diagnosis not present

## 2024-01-20 DIAGNOSIS — I10 Essential (primary) hypertension: Secondary | ICD-10-CM | POA: Insufficient documentation

## 2024-01-20 DIAGNOSIS — C778 Secondary and unspecified malignant neoplasm of lymph nodes of multiple regions: Secondary | ICD-10-CM | POA: Insufficient documentation

## 2024-01-20 LAB — CBC WITH DIFFERENTIAL (CANCER CENTER ONLY)
Abs Immature Granulocytes: 0.04 K/uL (ref 0.00–0.07)
Basophils Absolute: 0 K/uL (ref 0.0–0.1)
Basophils Relative: 1 %
Eosinophils Absolute: 0.1 K/uL (ref 0.0–0.5)
Eosinophils Relative: 1 %
HCT: 35.2 % — ABNORMAL LOW (ref 36.0–46.0)
Hemoglobin: 11.4 g/dL — ABNORMAL LOW (ref 12.0–15.0)
Immature Granulocytes: 1 %
Lymphocytes Relative: 29 %
Lymphs Abs: 1.8 K/uL (ref 0.7–4.0)
MCH: 27 pg (ref 26.0–34.0)
MCHC: 32.4 g/dL (ref 30.0–36.0)
MCV: 83.4 fL (ref 80.0–100.0)
Monocytes Absolute: 0.9 K/uL (ref 0.1–1.0)
Monocytes Relative: 15 %
Neutro Abs: 3.5 K/uL (ref 1.7–7.7)
Neutrophils Relative %: 53 %
Platelet Count: 197 K/uL (ref 150–400)
RBC: 4.22 MIL/uL (ref 3.87–5.11)
RDW: 16.5 % — ABNORMAL HIGH (ref 11.5–15.5)
WBC Count: 6.4 K/uL (ref 4.0–10.5)
nRBC: 0 % (ref 0.0–0.2)

## 2024-01-20 LAB — CMP (CANCER CENTER ONLY)
ALT: 9 U/L (ref 0–44)
AST: 15 U/L (ref 15–41)
Albumin: 4.3 g/dL (ref 3.5–5.0)
Alkaline Phosphatase: 86 U/L (ref 38–126)
Anion gap: 9 (ref 5–15)
BUN: 11 mg/dL (ref 8–23)
CO2: 33 mmol/L — ABNORMAL HIGH (ref 22–32)
Calcium: 9.5 mg/dL (ref 8.9–10.3)
Chloride: 98 mmol/L (ref 98–111)
Creatinine: 0.64 mg/dL (ref 0.44–1.00)
GFR, Estimated: >60 mL/min (ref >60)
Glucose, Bld: 90 mg/dL (ref 70–99)
Potassium: 3.4 mmol/L — ABNORMAL LOW (ref 3.5–5.1)
Sodium: 140 mmol/L (ref 135–145)
Total Bilirubin: 0.4 mg/dL (ref 0.0–1.2)
Total Protein: 7.3 g/dL (ref 6.5–8.1)

## 2024-01-20 LAB — FERRITIN: Ferritin: 38 ng/mL (ref 11–307)

## 2024-01-20 LAB — LACTATE DEHYDROGENASE: LDH: 224 U/L — ABNORMAL HIGH (ref 98–192)

## 2024-01-20 LAB — VITAMIN B12: Vitamin B-12: 885 pg/mL (ref 180–914)

## 2024-01-20 NOTE — Assessment & Plan Note (Addendum)
 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 Left inguinal lymph node biopsy likely is related to cutaneous squamous cell carcinoma.  However, IHC cannot delineate tumor origin.  Will obtain TO.

## 2024-01-21 ENCOUNTER — Encounter (HOSPITAL_COMMUNITY): Payer: Self-pay

## 2024-01-21 NOTE — Progress Notes (Signed)
 [name] POUR, MD  [name] Approved us  guided core biopsy of LEFT AXILLARY LN.  Patient has both melanoma and metastatic carcinoma (squam).  Left inguinal node recently bx, now need left axilla to determine which alignancy type it is.  No sedation.  HKM       Previous Messages    ----- Message ----- From: [name] Sent: 01/20/2024   2:07 PM EDT To: [name]; Ir Procedure Requests Subject: Us  Core Biopsy ( lymph nodes)                  Procedure : US  Core Biopsy ( lymph nodes)  Reason: left axillary lymph node with melanona and SCC needs diagnosis on which cancer it is Dx: Melanoma of skin (HCC) [C43.9 (ICD-10-CM)]; Lymphadenopathy [R59.1 (ICD-10-CM)]    History : US  core biopsy, NM PET initial whole body  Provider : Tina Pauletta BROCKS, MD  Contact : [phone]

## 2024-01-22 ENCOUNTER — Ambulatory Visit: Payer: Self-pay

## 2024-01-22 DIAGNOSIS — C439 Malignant melanoma of skin, unspecified: Secondary | ICD-10-CM | POA: Insufficient documentation

## 2024-01-22 MED ORDER — PROCHLORPERAZINE MALEATE 10 MG PO TABS: 10.0000 mg | ORAL_TABLET | Freq: Four times a day (QID) | ORAL | 1 refills | Status: DC | PRN

## 2024-01-22 MED ORDER — ONDANSETRON HCL 8 MG PO TABS
8.0000 mg | ORAL_TABLET | Freq: Three times a day (TID) | ORAL | 1 refills | Status: DC | PRN
Start: 2024-01-22 — End: 2024-04-16

## 2024-01-22 NOTE — Addendum Note (Signed)
 Addended by: TINA HUDSON on: 01/22/2024 03:43 PM   Modules accepted: Orders

## 2024-01-22 NOTE — Addendum Note (Signed)
 Addended by: TINA HUDSON on: 01/22/2024 01:47 PM   Modules accepted: Orders

## 2024-01-22 NOTE — Assessment & Plan Note (Signed)
 Biopsy of left axillary lymph node ordered. CBC, CMP, LDH New staging scan before systemic treatment.

## 2024-01-22 NOTE — Telephone Encounter (Signed)
-----   Message from Pauletta JAYSON Chihuahua sent at 01/22/2024  1:02 PM EDT ----- [name] please let daughter know her iron  is on the lower side. Slightly anemic. May take an OTC iron  daily. Like ferrous sulfate or gentle iron  if she cannot tolerate ferrous sulfate d/t  constipation in the past. Thanks.  ----- Message ----- From: Rebecka, Lab In [place] Sent: 01/20/2024   2:35 PM EDT To: Pauletta JAYSON Chihuahua, MD

## 2024-01-22 NOTE — Telephone Encounter (Signed)
 Per Dr. Marjorie, called pt daughter with message below. Pt daughter Holley verbalized understanding and is agreement to mother having PAC for better access.

## 2024-01-22 NOTE — Addendum Note (Signed)
 Addended by: TINA HUDSON on: 01/22/2024 04:29 PM   Modules accepted: Orders

## 2024-01-22 NOTE — Progress Notes (Signed)
 START ON PATHWAY REGIMEN - Melanoma and Other Skin Cancers     A cycle is every 21 days:     Pembrolizumab    **Always confirm dose/schedule in your pharmacy ordering system**  Patient Characteristics: Melanoma, Cutaneous/Unknown Primary, Preoperative or Nonsurgical Candidate, M0 (Clinical Staging), Any cT, cN+, Unresectable, Systemic Therapy Indicated, BRAF V600 Wild Type / BRAF V600 Results Pending or Unknown Disease Classification: Melanoma Disease Subtype: Cutaneous BRAF V600 Mutation Status: Awaiting BRAF V600 Results Therapeutic Status: Preoperative or Nonsurgical Candidate, M0 (Clinical Staging) AJCC T Category: cT4b AJCC N Category: cN1 AJCC M Category: cM0 AJCC 8 Stage Grouping: III Type of Therapy: Systemic Therapy Indicated Intent of Therapy: Non-Curative / Palliative Intent, Discussed with Patient

## 2024-01-23 ENCOUNTER — Other Ambulatory Visit: Payer: Self-pay

## 2024-01-28 ENCOUNTER — Encounter (HOSPITAL_COMMUNITY): Payer: Self-pay

## 2024-01-30 DIAGNOSIS — C4359 Malignant melanoma of other part of trunk: Secondary | ICD-10-CM | POA: Diagnosis not present

## 2024-02-05 ENCOUNTER — Inpatient Hospital Stay

## 2024-02-05 ENCOUNTER — Ambulatory Visit (HOSPITAL_COMMUNITY)
Admission: RE | Admit: 2024-02-05 | Discharge: 2024-02-05 | Disposition: A | Discharge status: Home / Self Care | Source: Ambulatory Visit

## 2024-02-05 ENCOUNTER — Other Ambulatory Visit: Payer: Self-pay | Admitting: Radiology

## 2024-02-05 ENCOUNTER — Inpatient Hospital Stay (HOSPITAL_BASED_OUTPATIENT_CLINIC_OR_DEPARTMENT_OTHER)

## 2024-02-05 ENCOUNTER — Ambulatory Visit: Payer: Self-pay

## 2024-02-05 VITALS — BP 165/86 | HR 79 | Temp 98.5°F | Resp 24

## 2024-02-05 VITALS — BP 143/82 | HR 79 | Temp 98.5°F | Resp 18

## 2024-02-05 DIAGNOSIS — R591 Generalized enlarged lymph nodes: Secondary | ICD-10-CM

## 2024-02-05 DIAGNOSIS — C439 Malignant melanoma of skin, unspecified: Secondary | ICD-10-CM

## 2024-02-05 DIAGNOSIS — D649 Anemia, unspecified: Secondary | ICD-10-CM | POA: Diagnosis not present

## 2024-02-05 DIAGNOSIS — I517 Cardiomegaly: Secondary | ICD-10-CM | POA: Diagnosis not present

## 2024-02-05 DIAGNOSIS — J189 Pneumonia, unspecified organism: Secondary | ICD-10-CM

## 2024-02-05 DIAGNOSIS — Z7984 Long term (current) use of oral hypoglycemic drugs: Secondary | ICD-10-CM | POA: Diagnosis not present

## 2024-02-05 DIAGNOSIS — C778 Secondary and unspecified malignant neoplasm of lymph nodes of multiple regions: Secondary | ICD-10-CM | POA: Diagnosis not present

## 2024-02-05 DIAGNOSIS — R0989 Other specified symptoms and signs involving the circulatory and respiratory systems: Secondary | ICD-10-CM | POA: Diagnosis not present

## 2024-02-05 DIAGNOSIS — R059 Cough, unspecified: Secondary | ICD-10-CM | POA: Diagnosis not present

## 2024-02-05 DIAGNOSIS — R051 Acute cough: Secondary | ICD-10-CM | POA: Diagnosis not present

## 2024-02-05 DIAGNOSIS — R918 Other nonspecific abnormal finding of lung field: Secondary | ICD-10-CM | POA: Diagnosis not present

## 2024-02-05 DIAGNOSIS — I10 Essential (primary) hypertension: Secondary | ICD-10-CM | POA: Diagnosis not present

## 2024-02-05 DIAGNOSIS — Z85820 Personal history of malignant melanoma of skin: Secondary | ICD-10-CM | POA: Diagnosis not present

## 2024-02-05 DIAGNOSIS — C4492 Squamous cell carcinoma of skin, unspecified: Secondary | ICD-10-CM | POA: Diagnosis not present

## 2024-02-05 DIAGNOSIS — E119 Type 2 diabetes mellitus without complications: Secondary | ICD-10-CM | POA: Diagnosis not present

## 2024-02-05 MED ORDER — LIDOCAINE-PRILOCAINE 2.5-2.5 % EX CREA: 1.0000 | TOPICAL_CREAM | CUTANEOUS | 0 refills | Status: AC | PRN

## 2024-02-05 MED ORDER — DOXYCYCLINE HYCLATE 100 MG PO TABS: 100.0000 mg | ORAL_TABLET | Freq: Two times a day (BID) | ORAL | 0 refills | Status: DC

## 2024-02-05 NOTE — Telephone Encounter (Signed)
 Pt's daughter, Holley, advised of CXR results and recommendations with verbal understanding.

## 2024-02-05 NOTE — Progress Notes (Signed)
 [facility] OFFICE PROGRESS NOTE  Patient Care Team: Orpha Yancey LABOR, MD as PCP - General (Unknown Physician Specialty) Charls Pearla LABOR, MD (Inactive) as Attending Physician (Cardiology) Gaither Anes, MD as Attending Physician (Neurosurgery)  [name] is a 83 y.o.female with history of melanoma, cSCC, HTN, HLD, DJD, CAD, HFpEF, arthritis being seen at [facility] for abnormal LAD and biopsy.   She is here for unexpected visit.  Report acute cough, some shortness of breath. Assessment & Plan Melanoma of skin (HCC) Biopsy of left axillary lymph node ordered. New staging scan before systemic treatment. Prescribed Emla  cream to pharmacy today. Port placement tomorrow Lymphadenopathy Pending axillary biopsy tomorrow Acute cough Chest x-ray ordered Addendum: CXR IMPRESSION: Patchy right perihilar and left basilar airspace opacities, likely atelectasis given the low lung volumes. Alternatively, a developing bronchopneumonia could have this appearance in the correct clinical context.  Recommend doxycycline  twice daily for 7 days given her frail status and acute onset of symptoms.  Orders Placed This Encounter  Procedures   NM PET Image Restage (PS) Whole Body    Standing Status:   Future    Expected Date:   02/12/2024    Expiration Date:   02/04/2025    If indicated for the ordered procedure, I authorize the administration of a radiopharmaceutical per Radiology protocol:   Yes    Preferred imaging location?:   [facility]   DG Chest 2 View    Standing Status:   Future    Number of Occurrences:   1    Expiration Date:   02/04/2025    Reason for Exam (SYMPTOM  OR DIAGNOSIS REQUIRED):   cough r/o pneumonia    Preferred imaging location?:   [facility]     Pauletta JAYSON Chihuahua, MD  INTERVAL HISTORY: [name] is with her daughter [name] today.  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  Report acute cough started this morning.  She is coughing up some sputum.   No fever or chills.  No sinus pain.  She has some short of breath but not worsening.  No other systemic symptoms, no diarrhea.  She went to the beach last week.  Oncology History  Melanoma of skin (HCC)  01/22/2024 Initial Diagnosis   Melanoma of skin (HCC)   02/13/2024 -  Chemotherapy   Patient is on Treatment Plan : MELANOMA Pembrolizumab (200) q21d        PHYSICAL EXAMINATION: ECOG PERFORMANCE STATUS: 3 - Symptomatic, >50% confined to bed  Vitals:   02/05/24 1315  BP: (!) 143/82  Pulse: 79  Resp: 18  Temp: 98.5 F (36.9 C)  SpO2: 96%   Filed Weights    GENERAL: alert, no distress and comfortable on wheelchair SKIN: skin color normal  HEENT: No sinus pain on palpation LYMPH: Enlarged left axillary axillary lymphadenopathy about 4 cm LUNGS: Few inspiratory crackle bilaterally with normal breathing effort HEART: regular rate & rhythm    Relevant data reviewed during this visit included labs.  X-ray ordered.

## 2024-02-05 NOTE — Telephone Encounter (Signed)
-----   Message from Pauletta JAYSON Chihuahua sent at 02/05/2024  2:53 PM EDT ----- Would you let daughter Holley know, chest x-ray cannot completely rule out pneumonia because some haziness.  I recommend doxycycline  twice a day for 7 days.  I sent it to her pharmacy.  Hold iron  while taking doxycycline .  Avoid sun exposure and use sunscreen if outside for long time. Take some probiotic yogurt daily after finishing antibiotics. ----- Message ----- From: Interface, Rad Results In Sent: 02/05/2024   2:24 PM EDT To: Pauletta JAYSON Chihuahua, MD

## 2024-02-05 NOTE — H&P (Incomplete)
 Chief Complaint: Melanoma, lymphadenopathy, cough/possible pneumonia; referred for image guided Port-A-Cath placement and left axillary lymph node biopsy  Referring Provider(s): Chang,R  Supervising Physician: Philip Cornet  Patient Status: [name] - Out-pt  History of Present Illness: [name] is an 83 y.o. female past medical history of aortic stenosis, COPD, DJD, eczema, hypertension, gallstones, hyperlipidemia, osteoarthritis, prior stroke, diabetes, left arm squamous cell carcinoma, melanoma and recent left inguinal lymph node biopsy consistent with either squamous cell carcinoma or urothelial carcinoma.  She presents today for biopsy of left axillary lymph node which was also positive on PET scan as well as Port-A-Cath placement to assist with treatment.  *** Patient is Full Code  Past Medical History:  Diagnosis Date   Aortic stenosis    Carpal tunnel syndrome, right    COPD (chronic obstructive pulmonary disease) (HCC)    DJD (degenerative joint disease)    Eczematous dermatitis    Essential hypertension    Gallstones    Hyperlipidemia    Osteoarthritis    Rotator cuff syndrome    Spinal stenosis of lumbar region    Squamous cell carcinoma    Stroke [facility])    Type 2 diabetes mellitus (HCC)     Past Surgical History:  Procedure Laterality Date   BACK SURGERY     CATARACT EXTRACTION  2011   bilateral   JOINT REPLACEMENT  09-27-11   '04/ right with resection for infection, now antibiotic spacer planned   JOINT REPLACEMENT     not replaced- only surgery 2007/2011/2012.   LUMBAR LAMINECTOMY/DECOMPRESSION MICRODISCECTOMY  04/19/2011   Procedure: LUMBAR LAMINECTOMY/DECOMPRESSION MICRODISCECTOMY;  Surgeon: Tanda DELENA Heading;  Location: [facility];  Service: Orthopedics;  Laterality: N/A;  Central decompression Lumbar two to lumbar three and Three to Lumbar Four    (xray)   TOTAL KNEE REVISION  12/04/2011   Procedure: TOTAL KNEE REVISION;  Surgeon: Dempsey LULLA Moan, MD;   Location: [facility];  Service: Orthopedics;  Laterality: Right;  Reimplantation of a Right Total Knee Arthroplasty   TUBAL LIGATION      Allergies: Tizanidine and Ciprofloxacin  Medications: Prior to Admission medications   Medication Sig Start Date End Date Taking? Authorizing Provider  albuterol (VENTOLIN HFA) 108 (90 Base) MCG/ACT inhaler Inhale 1 puff into the lungs every 4 (four) hours as needed for shortness of breath. 08/31/20   [provider]  aspirin  EC 81 MG tablet Take 81 mg by mouth daily. Patient not taking: Reported on 10/03/2020    [provider]  benazepril -hydrochlorthiazide (LOTENSIN  HCT) 20-25 MG tablet Hold this medication until you follow up with PCP because your blood pressure was too low. 05/21/19   Vanessa [place], MD  clopidogrel  (PLAVIX ) 75 MG tablet Take 75 mg by mouth daily. 10/19/12   [provider]  doxycycline  (VIBRA -TABS) 100 MG tablet Take 1 tablet (100 mg total) by mouth 2 (two) times daily. 02/05/24   Vanessa Pauletta BROCKS, MD  Ferrous Sulfate (IRON ) 325 (65 FE) MG TABS Take 65 mg by mouth. Occasionally Patient not taking: Reported on 10/03/2020    [provider]  fish oil-omega-3 fatty acids 1000 MG capsule Take 2 g by mouth daily.    [provider]  gabapentin (NEURONTIN) 100 MG capsule Take 100 mg by mouth 2 (two) times daily. 07/05/20   [provider]  gabapentin (NEURONTIN) 300 MG capsule Take 300 mg by mouth 3 (three) times daily. Patient not taking: Reported on 10/03/2020 05/10/19   [provider]  hydrochlorothiazide  (HYDRODIURIL ) 25 MG tablet Take by mouth. Patient not taking: Reported on 10/03/2020 09/15/19   [provider]  labetalol  (NORMODYNE ) 300 MG tablet Take 300 mg by mouth 2 (two) times daily.    [provider]  lidocaine -prilocaine  (EMLA ) cream Apply 1 Application topically as needed. 02/05/24   Vanessa Pauletta BROCKS, MD  lisinopril  (ZESTRIL ) 20 MG tablet Take by mouth. Patient  not taking: Reported on 10/03/2020 09/14/19   [provider]  metFORMIN (GLUCOPHAGE) 500 MG tablet Take 1 tablet by mouth 2 (two) times daily. 11/28/16   [provider]  Multiple Vitamin (MULTIVITAMIN) tablet Take 1 tablet by mouth daily. Patient not taking: Reported on 10/03/2020    [provider]  Multiple Vitamins-Minerals (VITAMIN D3 COMPLETE PO) Take 1 tablet by mouth daily.    [provider]  NIFEdipine (ADALAT CC) 30 MG 24 hr tablet Take by mouth. Patient not taking: Reported on 10/03/2020 09/15/19   [provider]  ondansetron  (ZOFRAN ) 8 MG tablet Take 1 tablet (8 mg total) by mouth every 8 (eight) hours as needed for nausea or vomiting. 01/22/24   Vanessa Pauletta BROCKS, MD  pravastatin (PRAVACHOL) 40 MG tablet Take 40 mg by mouth daily. 08/02/19   [provider]  prochlorperazine  (COMPAZINE ) 10 MG tablet Take 1 tablet (10 mg total) by mouth every 6 (six) hours as needed for nausea or vomiting. 01/22/24   Vanessa Pauletta BROCKS, MD  sertraline (ZOLOFT) 50 MG tablet Take 50 mg by mouth daily. 02/27/21   [provider]  tiZANidine (ZANAFLEX) 2 MG tablet Take by mouth. Patient not taking: Reported on 10/03/2020    [provider]  vitamin B-12 (CYANOCOBALAMIN ) 100 MCG tablet Take 100 mcg by mouth daily.    [provider]  vitamin C  (ASCORBIC ACID ) 500 MG tablet Take 500 mg by mouth daily.    [provider]  VITAMIN E  PO Take 1 tablet by mouth daily.    [provider]     Family History  Problem Relation Age of Onset   Heart attack Mother    Cancer Mother        cholangiocarcinoma   Heart disease Father    Heart attack Brother    Heart attack Brother    Heart attack Brother    Coronary artery disease Other    Diabetes Other     Social History   Socioeconomic History   Marital status: Married    Spouse name: [name]   Number of children: 3   Years of education: 12   Highest education level: Not  on file  Occupational History   Occupation: Retired    Comment: [employer]  Tobacco Use   Smoking status: Never   Smokeless tobacco: Never  Substance and Sexual Activity   Alcohol use: No   Drug use: No   Sexual activity: Yes  Other Topics Concern   Not on file  Social History Narrative   Lives with husband. Retired, worked in a [name]. Does not regularly exercise.    Caffeine Use: 2 12oz sodas daily   Social Drivers of Corporate investment banker Strain: Low Risk  (01/15/2023)   Received from [facility]   Overall Financial Resource Strain (CARDIA)    Difficulty of Paying Living Expenses: Not hard at all  Food Insecurity: No Food Insecurity (01/20/2024)   Hunger Vital Sign    Worried About Running Out of Food in the Last Year: Never true  Ran Out of Food in the Last Year: Never true  Transportation Needs: No Transportation Needs (01/20/2024)   PRAPARE - Administrator, Civil Service (Medical): No    Lack of Transportation (Non-Medical): No  Physical Activity: Inactive (05/24/2022)   [facility]   Exercise Vital Sign    On average, how many days per week do you engage in moderate to strenuous exercise (like a brisk walk)?: 0 days    On average, how many minutes do you engage in exercise at this level?: 10 min  Stress: No Stress Concern Present (05/24/2022)   Received from [facility] of Occupational Health - Occupational Stress Questionnaire    Feeling of Stress : Not at all  Social Connections: Moderately Integrated (05/24/2022)   Received from [facility]   Social Connection and Isolation Panel    Frequency of Communication with Friends and Family: Not on file    How often do you get together with friends or relatives?: More than three times a week    How often do you attend church or religious services?: More than 4 times per year    Do you belong to any clubs or organizations such as church groups,  unions, fraternal or athletic groups, or school groups?: Yes    How often do you attend meetings of the clubs or organizations you belong to?: More than 4 times per year    Are you married, widowed, divorced, separated, never married, or living with a partner?: Separated       Review of Systems  Vital Signs:   Advance Care Plan: no documents on file  Physical Exam  Imaging: DG Chest 2 View Result Date: 02/05/2024 CLINICAL DATA:  cough r/o pneumonia EXAM: CHEST - 2 VIEW COMPARISON:  March 14, 2021 FINDINGS: Markedly low lung volumes. Patchy right perihilar and left basilar airspace opacities. No pneumothorax or pleural effusion. Mild cardiomegaly. Tortuous aorta. no acute fracture or destructive lesions. Multilevel thoracic osteophytosis. Partially visualized lumbar fusion hardware. IMPRESSION: Patchy right perihilar and left basilar airspace opacities, likely atelectasis given the low lung volumes. Alternatively, a developing bronchopneumonia could have this appearance in the correct clinical context. Electronically Signed   By: Rogelia Myers M.D.   On: 02/05/2024 14:22    Labs:  CBC: Recent Labs    01/20/24 1425  WBC 6.4  HGB 11.4*  HCT 35.2*  PLT 197    COAGS: No results for input(s): INR, APTT in the last 8760 hours.  BMP: Recent Labs    01/20/24 1425  NA 140  K 3.4*  CL 98  CO2 33*  GLUCOSE 90  BUN 11  CALCIUM 9.5  CREATININE 0.64  GFRNONAA >60    LIVER FUNCTION TESTS: Recent Labs    01/20/24 1425  BILITOT 0.4  AST 15  ALT 9  ALKPHOS 86  PROT 7.3  ALBUMIN 4.3    TUMOR MARKERS: No results for input(s): AFPTM, CEA, CA199, CHROMGRNA in the last 8760 hours.  Assessment and Plan: 82 y.o. female past medical history of aortic stenosis, COPD, DJD, eczema, hypertension, gallstones, hyperlipidemia, osteoarthritis, prior stroke, diabetes, left arm squamous cell carcinoma, melanoma and recent left inguinal lymph node biopsy consistent with  either squamous cell carcinoma or urothelial carcinoma.  She presents today for biopsy of left axillary lymph node which was also positive on PET scan as well as Port-A-Cath placement to assist with treatment.Risks and benefits of image guided port-a-catheter placement /  lymph node biopsy was discussed with the patient including, but not limited to bleeding, infection, pneumothorax, or fibrin sheath development, injury to adjacent structures and need for additional procedures.  All of the patient's questions were answered, patient is agreeable to proceed. Consent signed and in chart.    Thank you for allowing our service to participate in FELIX PRATT 's care.  Electronically Signed: D. Franky Rakers, PA-C   02/05/2024, 5:35 PM      I spent a total of  25 minutes   in face to face in clinical consultation, greater than 50% of which was counseling/coordinating care for  image guided Port-A-Cath placement and left axillary lymph node biopsy

## 2024-02-05 NOTE — Assessment & Plan Note (Addendum)
 Biopsy of left axillary lymph node ordered. New staging scan before systemic treatment. Prescribed Emla  cream to pharmacy today. Port placement tomorrow

## 2024-02-05 NOTE — Progress Notes (Unsigned)
 Pharmacist Chemotherapy Monitoring - Initial Assessment    Anticipated start date: 02/13/24   The following has been reviewed per standard work regarding the patient's treatment regimen: The patient's diagnosis, treatment plan and drug doses, and organ/hematologic function Lab orders and baseline tests specific to treatment regimen  The treatment plan start date, drug sequencing, and pre-medications Prior authorization status  Patient's documented medication list, including drug-drug interaction screen and prescriptions for anti-emetics and supportive care specific to the treatment regimen The drug concentrations, fluid compatibility, administration routes, and timing of the medications to be used The patient's access for treatment and lifetime cumulative dose history, if applicable  The patient's medication allergies and previous infusion related reactions, if applicable   Changes made to treatment plan:  treatment plan date  Follow up needed:  N/A   Bridgett Leotis Helling, RPH, BCPS, BCOP 02/05/2024  11:45 AM

## 2024-02-06 ENCOUNTER — Ambulatory Visit (HOSPITAL_COMMUNITY)
Admission: RE | Admit: 2024-02-06 | Discharge: 2024-02-06 | Disposition: A | Discharge status: Home / Self Care | Source: Ambulatory Visit

## 2024-02-06 ENCOUNTER — Other Ambulatory Visit: Payer: Self-pay

## 2024-02-06 ENCOUNTER — Encounter (HOSPITAL_COMMUNITY): Payer: Self-pay

## 2024-02-06 DIAGNOSIS — R590 Localized enlarged lymph nodes: Secondary | ICD-10-CM | POA: Diagnosis present

## 2024-02-06 DIAGNOSIS — Z85820 Personal history of malignant melanoma of skin: Secondary | ICD-10-CM | POA: Diagnosis not present

## 2024-02-06 DIAGNOSIS — C439 Malignant melanoma of skin, unspecified: Secondary | ICD-10-CM

## 2024-02-06 DIAGNOSIS — Z8249 Family history of ischemic heart disease and other diseases of the circulatory system: Secondary | ICD-10-CM | POA: Insufficient documentation

## 2024-02-06 DIAGNOSIS — E785 Hyperlipidemia, unspecified: Secondary | ICD-10-CM | POA: Insufficient documentation

## 2024-02-06 DIAGNOSIS — R7309 Other abnormal glucose: Secondary | ICD-10-CM

## 2024-02-06 DIAGNOSIS — E119 Type 2 diabetes mellitus without complications: Secondary | ICD-10-CM | POA: Diagnosis not present

## 2024-02-06 DIAGNOSIS — Z7984 Long term (current) use of oral hypoglycemic drugs: Secondary | ICD-10-CM | POA: Diagnosis not present

## 2024-02-06 DIAGNOSIS — J449 Chronic obstructive pulmonary disease, unspecified: Secondary | ICD-10-CM | POA: Diagnosis not present

## 2024-02-06 DIAGNOSIS — I350 Nonrheumatic aortic (valve) stenosis: Secondary | ICD-10-CM | POA: Insufficient documentation

## 2024-02-06 DIAGNOSIS — C773 Secondary and unspecified malignant neoplasm of axilla and upper limb lymph nodes: Secondary | ICD-10-CM | POA: Diagnosis not present

## 2024-02-06 DIAGNOSIS — Z8673 Personal history of transient ischemic attack (TIA), and cerebral infarction without residual deficits: Secondary | ICD-10-CM | POA: Diagnosis not present

## 2024-02-06 DIAGNOSIS — M1990 Unspecified osteoarthritis, unspecified site: Secondary | ICD-10-CM | POA: Diagnosis not present

## 2024-02-06 DIAGNOSIS — R599 Enlarged lymph nodes, unspecified: Secondary | ICD-10-CM | POA: Diagnosis not present

## 2024-02-06 DIAGNOSIS — C4492 Squamous cell carcinoma of skin, unspecified: Secondary | ICD-10-CM

## 2024-02-06 DIAGNOSIS — R591 Generalized enlarged lymph nodes: Secondary | ICD-10-CM

## 2024-02-06 DIAGNOSIS — Z833 Family history of diabetes mellitus: Secondary | ICD-10-CM | POA: Diagnosis not present

## 2024-02-06 DIAGNOSIS — I10 Essential (primary) hypertension: Secondary | ICD-10-CM | POA: Insufficient documentation

## 2024-02-06 DIAGNOSIS — D649 Anemia, unspecified: Secondary | ICD-10-CM

## 2024-02-06 HISTORY — PX: IR US LIVER BIOPSY: IMG936

## 2024-02-06 LAB — CBC WITH DIFFERENTIAL/PLATELET
Abs Immature Granulocytes: 0.06 K/uL (ref 0.00–0.07)
Basophils Absolute: 0 K/uL (ref 0.0–0.1)
Basophils Relative: 0 %
Eosinophils Absolute: 0.1 K/uL (ref 0.0–0.5)
Eosinophils Relative: 1 %
HCT: 38.7 % (ref 36.0–46.0)
Hemoglobin: 11.6 g/dL — ABNORMAL LOW (ref 12.0–15.0)
Immature Granulocytes: 1 %
Lymphocytes Relative: 29 %
Lymphs Abs: 1.5 K/uL (ref 0.7–4.0)
MCH: 26.7 pg (ref 26.0–34.0)
MCHC: 30 g/dL (ref 30.0–36.0)
MCV: 89.2 fL (ref 80.0–100.0)
Monocytes Absolute: 0.7 K/uL (ref 0.1–1.0)
Monocytes Relative: 14 %
Neutro Abs: 2.7 K/uL (ref 1.7–7.7)
Neutrophils Relative %: 55 %
Platelets: 226 K/uL (ref 150–400)
RBC: 4.34 MIL/uL (ref 3.87–5.11)
RDW: 16.8 % — ABNORMAL HIGH (ref 11.5–15.5)
WBC: 5 K/uL (ref 4.0–10.5)
nRBC: 0 % (ref 0.0–0.2)

## 2024-02-06 LAB — GLUCOSE, CAPILLARY: Glucose-Capillary: 103 mg/dL — ABNORMAL HIGH (ref 70–99)

## 2024-02-06 LAB — BASIC METABOLIC PANEL WITH GFR
Anion gap: 13 (ref 5–15)
BUN: 9 mg/dL (ref 8–23)
CO2: 29 mmol/L (ref 22–32)
Calcium: 9.8 mg/dL (ref 8.9–10.3)
Chloride: 100 mmol/L (ref 98–111)
Creatinine, Ser: 0.8 mg/dL (ref 0.44–1.00)
GFR, Estimated: >60 mL/min (ref >60)
Glucose, Bld: 114 mg/dL — ABNORMAL HIGH (ref 70–99)
Potassium: 4.3 mmol/L (ref 3.5–5.1)
Sodium: 142 mmol/L (ref 135–145)

## 2024-02-06 LAB — PROTIME-INR
INR: 1 (ref 0.8–1.2)
Prothrombin Time: 14.1 s (ref 11.4–15.2)

## 2024-02-06 MED ORDER — SODIUM CHLORIDE 0.9 % IV SOLN: INTRAVENOUS | Status: DC

## 2024-02-06 MED ORDER — LIDOCAINE-EPINEPHRINE 1 %-1:100000 IJ SOLN
INTRAMUSCULAR | Status: AC
Start: 2024-02-06 — End: 2024-02-06
  Filled 2024-02-06: qty 1

## 2024-02-06 MED ORDER — LIDOCAINE-EPINEPHRINE 1 %-1:100000 IJ SOLN
20.0000 mL | Freq: Once | INTRAMUSCULAR | Status: AC
  Administered 2024-02-06: 10 mL via INTRADERMAL

## 2024-02-06 NOTE — Procedures (Addendum)
 Interventional Radiology Procedure:   Indications: History of melanoma and metastatic carcinoma.  Left axillary lymph node enlargement  Procedure: US  guided core biopsy of left axillary lymph node  Findings: Enlarged left axillary lymph node.  Multiple core biopsies obtained.   Complications: None     EBL: Minimal  Plan: Discharge to home  Keyon Winnick R. Philip, MD  Pager: [phone]

## 2024-02-06 NOTE — Discharge Instructions (Signed)
Please call Interventional Radiology clinic [phone] with any questions or concerns.  You may remove your dressing and shower tomorrow.  After the procedure, it is common to have: Soreness Bruising Mild pain  Follow these instructions at home:  Medication: Do not use Aspirin or ibuprofen products, such as Advil or Motrin, as it may increase bleeding  You may resume your usual medications as ordered by your doctor If your doctor prescribed antibiotics, take them as directed. Do not stop taking them just because you feel better. You need to take the full course of antibiotics  Eating and drinking: Drink plenty of liquids to keep your urine pale yellow You can resume your regular diet as directed by your doctor   Care of the procedure site Wash your hands with soap and water before you change your bandage (dressing). If you cannot use soap and water, use hand sanitizer. Check your puncture site every day for signs of infection. Check for: Redness, swelling, or pain Fluid or blood Pus or a bad smell Warmth Activity Do not take baths, swim, or use a hot tub until your health care provider approves  Keep all follow-up visits as told by your doctor  Contact a doctor if you have: A fever Redness, swelling, or pain at the puncture site, and it lasts longer than a few days Fluid, blood, or pus coming from the puncture site Warmth coming from the puncture site  Get help right away if: You have a lot of bleeding from the puncture site

## 2024-02-07 DIAGNOSIS — E785 Hyperlipidemia, unspecified: Secondary | ICD-10-CM | POA: Diagnosis not present

## 2024-02-07 DIAGNOSIS — Z7984 Long term (current) use of oral hypoglycemic drugs: Secondary | ICD-10-CM | POA: Diagnosis not present

## 2024-02-07 DIAGNOSIS — C439 Malignant melanoma of skin, unspecified: Secondary | ICD-10-CM | POA: Diagnosis not present

## 2024-02-07 DIAGNOSIS — C7989 Secondary malignant neoplasm of other specified sites: Secondary | ICD-10-CM | POA: Diagnosis not present

## 2024-02-07 DIAGNOSIS — I110 Hypertensive heart disease with heart failure: Secondary | ICD-10-CM | POA: Diagnosis not present

## 2024-02-07 DIAGNOSIS — I5032 Chronic diastolic (congestive) heart failure: Secondary | ICD-10-CM | POA: Diagnosis not present

## 2024-02-07 DIAGNOSIS — R54 Age-related physical debility: Secondary | ICD-10-CM | POA: Diagnosis not present

## 2024-02-07 DIAGNOSIS — I10 Essential (primary) hypertension: Secondary | ICD-10-CM | POA: Diagnosis not present

## 2024-02-07 DIAGNOSIS — Z8249 Family history of ischemic heart disease and other diseases of the circulatory system: Secondary | ICD-10-CM | POA: Diagnosis not present

## 2024-02-07 DIAGNOSIS — R079 Chest pain, unspecified: Secondary | ICD-10-CM | POA: Diagnosis not present

## 2024-02-07 DIAGNOSIS — I2510 Atherosclerotic heart disease of native coronary artery without angina pectoris: Secondary | ICD-10-CM | POA: Diagnosis not present

## 2024-02-07 DIAGNOSIS — Z7902 Long term (current) use of antithrombotics/antiplatelets: Secondary | ICD-10-CM | POA: Diagnosis not present

## 2024-02-07 DIAGNOSIS — Z79899 Other long term (current) drug therapy: Secondary | ICD-10-CM | POA: Diagnosis not present

## 2024-02-07 DIAGNOSIS — R0602 Shortness of breath: Secondary | ICD-10-CM | POA: Diagnosis not present

## 2024-02-07 DIAGNOSIS — Z8673 Personal history of transient ischemic attack (TIA), and cerebral infarction without residual deficits: Secondary | ICD-10-CM | POA: Diagnosis not present

## 2024-02-07 DIAGNOSIS — I639 Cerebral infarction, unspecified: Secondary | ICD-10-CM | POA: Diagnosis not present

## 2024-02-07 DIAGNOSIS — J441 Chronic obstructive pulmonary disease with (acute) exacerbation: Secondary | ICD-10-CM | POA: Diagnosis not present

## 2024-02-07 DIAGNOSIS — R059 Cough, unspecified: Secondary | ICD-10-CM | POA: Diagnosis not present

## 2024-02-07 DIAGNOSIS — I440 Atrioventricular block, first degree: Secondary | ICD-10-CM | POA: Diagnosis not present

## 2024-02-07 DIAGNOSIS — E119 Type 2 diabetes mellitus without complications: Secondary | ICD-10-CM | POA: Diagnosis not present

## 2024-02-07 DIAGNOSIS — G4700 Insomnia, unspecified: Secondary | ICD-10-CM | POA: Diagnosis not present

## 2024-02-07 DIAGNOSIS — R0989 Other specified symptoms and signs involving the circulatory and respiratory systems: Secondary | ICD-10-CM | POA: Diagnosis not present

## 2024-02-07 NOTE — ED Provider Notes (Addendum)
 Emergency Department Provider Note    ED Clinical Impression   Final diagnoses:  Shortness of breath (Primary)  COPD exacerbation        ED Assessment/Plan    History   Chief Complaint  Patient presents with  . Shortness of Breath  . Cough  . Congestion  . Insomnia   HPI  1840 hrs. Patient is a 83 year old female she is a patient of Dr. Tillman and previous stroke takes her Plavix  but no other anticoagulation Short stature with pickwickian apology now with increasing shortness of breath of several days duration by exam she is awake and alert cooperative she is expiratory wheeze bilaterally normal heart tones abdomen benign neuroexam negative no peripheral edema triage old records will be reviewed she would likely require hospital admission for respiratory insufficiency visit for BiPAP  Past Medical History[1]  Past Surgical History[2]  Family History[3]  Social History[4]  Review of Systems  Respiratory:  Positive for shortness of breath and wheezing. Negative for chest tightness.   Cardiovascular:  Negative for chest pain.  All other systems reviewed and are negative.   Physical Exam   BP 182/80   Pulse 87   Temp 37.2 C (98.9 F) (Temporal)   Resp 16   Ht 165.1 cm (5' 5)   LMP  (LMP Unknown)   SpO2 94%   BMI 33.28 kg/m   Physical Exam  Vital signs have been reviewed. Patient is well-appearing w/o respiratory distress shock or major trauma. HEENT is atraumatic.  Neck shows unimpaired range of motion. Chest No increased work of breathing seen with increased work of breathing otherwise no acute findings  cardiac Good perfusion throughout. Abdomen is not distended. Extremities are without significant trauma.  No peripheral edema Skin no visualized rash. Neuro exam is grossly non-focal. Psych exam shows normal mood and behavior.  She has some audible wheeze  ED Course    Triage old records reviewed complex medical regimen occluding hypertension  diabetes she takes Demadex Plavix  history of COPD she states no cigarette smoking ever previous stroke hypertension diabetes  Most recent hospitalization November 2024 for syncope.  Admitted September 2024 for sepsis.  TIA December 2023.  Most recent CTA of the chest also August 2024 was negative for PE showing very slight dilatation of thoracic aorta and coronary calcification previous D-dimers August 2024 have been in the 900 range.  High-sensitivity troponins have been negative  Medical Decision Making Short of breath, wheezing, heart failure, accelerated hypertension  Amount and/or Complexity of Data Reviewed Independent Historian: caregiver External Data Reviewed: labs, radiology, ECG and notes. Labs: ordered. Decision-making details documented in ED Course. Radiology: ordered and independent interpretation performed. Decision-making details documented in ED Course. ECG/medicine tests: ordered and independent interpretation performed. Decision-making details documented in ED Course. Discussion of management or test interpretation with external provider(s): See dictated H&P  Risk Prescription drug management. Decision regarding hospitalization. Diagnosis or treatment significantly limited by social determinants of health. Risk Details: See dictated H&P    I have personal reviewed results of diagnostic testing including laboratory testing, diagnostic imaging and consultation.  I have also discussed the results with patient and available and family at bedside    Most recent echocardiogram August 2024 showing ejection fraction of greater than 70% most recent cardiac stress testing also August 2024 showing basically nondiagnostic findings with pharmacologic stress and  1930 hrs.  ABG obtained by me using Doppler ultrasound vascular access protocol in the right radial area poor pulses throughout with some diminished  perfusion   EKG sinus rhythm at 78 no acute ischemic changes normal  intervals     Dallara, Norleen Agent, MD 02/07/24 1846    Dallara, John James, MD 02/07/24 1848    Dallara, John James, MD 02/07/24 MARCE    [place] Norleen Agent, MD 02/07/24 1955       [1] Past Medical History: Diagnosis Date  . Cancer       . COPD (chronic obstructive pulmonary disease)       . Diabetes       . HTN (hypertension)   . Stroke       [2] Past Surgical History: Procedure Laterality Date  . ACDF    . KNEE SURGERY     knee replacements in both knees  . LEFT KNEE ARTHROSCOPY    . LEFT KNEE REPLACEMENT     . RIGHT KNEE ARTHROSCOPY    . SPINE SURGERY    [3] Family History Problem Relation Age of Onset  . Cancer Mother   . Heart attack Mother   . Hypertension Mother   . Heart attack Father   . Hypertension Father   . Heart failure Brother   . Heart disease Brother   [4] Social History Socioeconomic History  . Marital status: Legally Separated  Tobacco Use  . Smoking status: Never  . Smokeless tobacco: Never  Vaping Use  . Vaping status: Never Used  Substance and Sexual Activity  . Alcohol use: No  . Drug use: No  Other Topics Concern  . Exercise No  . Living Situation Yes   Social Drivers of Health   Financial Resource Strain: Low Risk  (01/15/2023)   Overall Financial Resource Strain (CARDIA)   . Difficulty of Paying Living Expenses: Not hard at all  Food Insecurity: No Food Insecurity (01/20/2024)   Received from [facility]   Hunger Vital Sign   . Within the past 12 months, you worried that your food would run out before you got the money to buy more.: Never true   . Within the past 12 months, the food you bought just didn't last and you didn't have money to get more.: Never true  Transportation Needs: No Transportation Needs (01/20/2024)   Received from [facility] - Transportation   . In the past 12 months, has lack of transportation kept you from medical appointments or from getting medications?: No   . In the past 12  months, has lack of transportation kept you from meetings, work, or from getting things needed for daily living?: No  Physical Activity: Inactive (05/24/2022)   Exercise Vital Sign   . Days of Exercise per Week: 0 days   . Minutes of Exercise per Session: 10 min  Stress: No Stress Concern Present (05/24/2022)   [employer] of Occupational Health - Occupational Stress Questionnaire   . Feeling of Stress : Not at all  Social Connections: Moderately Integrated (05/24/2022)   Social Connection and Isolation Panel   . Frequency of Social Gatherings with Friends and Family: More than three times a week   . Attends Religious Services: More than 4 times per year   . Active Member of Clubs or Organizations: Yes   . Attends Banker Meetings: More than 4 times per year   . Marital Status: Separated  Housing: Low Risk  (01/15/2023)   Housing   . Within the past 12 months, have you ever stayed: outside, in a car, in a tent, in an overnight  shelter, or temporarily in someone else's home (i.e. couch-surfing)?: No   . Are you worried about losing your housing?: No   Dallara, John James, MD 02/07/24 2135

## 2024-02-07 NOTE — H&P (Signed)
 History and Physical [facility]   02/07/24    Patient name: [name]. Nordmann DOB 07/14/1940 MRN#: [id number] PCP: Orpha Yancey Skeens, MD Time: 9:07 PM Primary Care Provider:  Orpha Yancey Skeens, MD Inpatient primary attending provider: Jerilynn Gordy Given, MD  _________________________________________________________________________  Admission HPI   Patient admitted on: 02/07/2024  6:51 PM  Patient admitted by: Jerilynn Gordy Given, MD   CHIEF COMPLAINT:  Dyspnea, cough and wheezing times 3 days.  Day of admission HPI:  [name]  is a 83 y.o. female with a PMH significant for COPD, HTN, HPL. CAD, and multiple comorbidities who presented with three day history of worsening cough, dyspnea, and wheezing. She notes some generalized weakness as well.  No fever or chills reported.  Denies chest pain.  No abdominal pain, nausea, vomiting or diarrhea.  Denies GU or neurologic symptoms.  Somewhat limited historian as she is hard of hearing and daughter is able to assist at bedside.  Not oxygen  dependent.   Physical exam: General appearance: No acute distress.  Hard of hearing.  On room air. Chest: Symmetric/nonlabored Heart: Per provider.  Normal rate. Abdomen: Per provider Neuro: Alert, awake and oriented.  Grossly nonfocal exam.  Patient admitted on Home O2? - no Patient on home anticoagulant? -  no Patient admitted with Chronic home foley catheter? - no Foley catheter placed or replaced by another service prior to admission? - no Central Line Status: NONE  Mental Status on Admission: The patient is Alert and oriented to PERSON The patient is Alert And oriented to TIME The patient is Alert and oriented to LOCATION  Problem List, Assessment & Plan    ASSESSMENT & PLAN (In order of descending acuity)  Assessment: COPD with acute exacerbation Hyperlipidemia Type 2 diabetes Hypertension Melanoma Lumbar  spondylosis Remote TIA DJD Multiple comorbidities  Plan: Admit to medicine on telemetry Antibiotic/neb/steroids Supplemental oxygen  Blood pressure control Glycemic control Supportive care Continue present management Condition fair to guarded Discussed with daughter at bedside   ADDITIONAL NON-ACUTE FINDINGS, OBSERVATIONS, FAMILY DISCUSSIONS, ETC. (When present):    Incidental Findings for ourpatient Follow-Up: No significant incidental findings present   DVT Prophylaxis Ordered: SQ Enoxaparin  __________________________________________________________________________  Temp:  [37.2 C (98.9 F)] 37.2 C (98.9 F) Pulse:  [87] 87 SpO2 Pulse:  [79] 79 Resp:  [16] 16 BP: (160-192)/(80-94) 182/80 FiO2 (%):  [21 %] 21 % SpO2:  [94 %-95 %] 94 % Body mass index is 33.28 kg/m. Intake/Output last 3 shifts: No intake/output data recorded.  Consults Requested  IP CONSULT TO HOSPITALIST     In hospital Nutrition: NPO Sips with meds; Medically necessary    An advanced care planning discussion was had with patient and/or patient's decisions maker (documented separately).  CODE STATUS :                    Full Code   Given this patient's known comorbid illnesses and condition Present on Admission, plan of care includes acute interventions of  as noted in the Problem List, Assessment & Plan noted above.  I fully expect, with this information in hand, that this patient will require at least two medically necessary midnights of hospital care prior to a safe discharge.   The patient's hospital stay is complicated by the above clinically significant conditions, as documented in Assessment and Plan, which are present on admission and requiring additional evaluation and treatment or having a significant effect of this patient's care.  __________________________________________________________  Allergies  Allergen Reactions  . Tizanidine   . Ciprofloxacin Rash     Past Medical  History[1]per chart  Past Surgical History[2] per chart  Family History[3] per chart      Current Medications[4]  REFER TO EPIC FOR FULL LIST OF CURRENT MEDICATIONS ORDERED ON ADMISSION. THESE ORDERS APPEAR ONLY WHEN RELEASED, WHICH MAY HAPPEN AFTER ADMISSION ONCE PATIENT IS TRANSFERRED FROM THE ED.  Allergies  Allergies[5]  Imaging  XR Chest Portable Result Date: 02/07/2024 Exam:  Portable Chest  History:  Chest pain, shortness of breath.  Technique:  Single frontal view.  Comparison:  February 27, 2023  Findings:   Cardiomediastinal silhouette is unchanged from prior when accounting for differences in positioning, rotation.  Lung volumes are low. No focal alveolar consolidation. No significant pleural effusion. Cervical spine fusion hardware. Degenerative changes of the shoulders bilaterally.    Low lung volumes. No focal alveolar consolidation.  Signed (Electronic Signature): 02/07/2024 7:42 PM Signed By: Ellouise Platt, MD   Lab Results   Recent Labs    02/07/24 2000  WBC 5.6  HGB 11.3*  HCT 35.3  PLT 208   Recent Labs    02/07/24 2000  NA 142  K 3.7  CL 102  CO2 31.3  BUN 7*  CREATININE 0.65  GLU 111  CALCIUM 9.0  ALBUMIN 3.9  PROT 7.4  BILITOT 0.4  AST 17  ALT 17  ALKPHOS 99   Recent Labs    02/07/24 2000  TROPONINI 17  INR 1.07   No results for input(s): WBCUA, NITRITE, LEUKOCYTESUR, BACTERIA, RBCUA, BLOODU, GLUCOSEU, PROTEINUA, KETONESU, KETUR in the last 72 hours. No results for input(s): OPIAU, BENZU, TRICYCLIC, PCPU, AMPHU, COCAU, CANNAU, BARBU, ETOH, ACETAMIN, SALICYLATE in the last 72 hours. No results for input(s): PREGTESTUR, PREGPOC in the last 72 hours. No results for input(s): OCCULTBLD, RAPSCRN, CDIFRPCR, CDIFFNAP1, A1C, CHOL, LDL, HDL, TRIG in the last 72 hours. Recent Labs    02/07/24 1946  PHART 7.45  PCO2ART 42.8  PO2ART 65*  HCO3ART 29.5*  O2SATART 93.5*  BEART  4.9*     Home Medications   Prior to Admission medications  Medication Dose, Route, Frequency  acetaminophen  (TYLENOL  8 HOUR) 650 MG CR tablet 650 mg, Every 8 hours PRN  albuterol HFA 90 mcg/actuation inhaler 1 puff  ascorbic acid , vitamin C , (ASCORBIC ACID  WITH ROSE HIPS) 500 MG tablet 500 mg, Daily (standard)  cholecalciferol, vitamin D3, (VITAMIN D3) 1,000 unit capsule 1,000 Units, Daily (standard)  clopidogrel  (PLAVIX ) 75 mg tablet 75 mg, Daily (standard)  cyanocobalamin  (VITAMIN B-12) 500 MCG tablet 500 mcg, Daily (standard)  losartan (COZAAR) 25 MG tablet 25 mg, Oral, Daily (standard)  metFORMIN (GLUCOPHAGE) 500 MG tablet 500 mg, 2 times a day with meals  omega 3-dha-epa-fish oil (FISH OIL) 1,200 (144-216) mg cap 1,200 mg, Daily  omega-3 acid ethyl esters (LOVAZA ) 1 gram capsule 2 g  oxybutynin (DITROPAN-XL) 5 MG 24 hr tablet 5 mg, Daily (standard)  pravastatin (PRAVACHOL) 40 MG tablet 40 mg, Daily (standard)  sertraline (ZOLOFT) 50 MG tablet 50 mg, Oral, Daily (standard)  torsemide (DEMADEX) 20 MG tablet 20 mg, Daily (standard)  triamcinolone  (KENALOG ) 0.1 % ointment 1 Application, Daily PRN   Jerilynn JINNY Given, MD Hospitalist, [facility] 02/07/24, 9:07 PM   I was outside the hospital.  I spent 40 minutes on the real-time audio and video with the patient for a a consultation. Total time was 40 minutes and over 50% of the service was counseling  and/or coordination of care within the patient unit. Physical Exam performed with the assistance of bedside nursing staff.       [1] Past Medical History: Diagnosis Date  . Cancer       . COPD (chronic obstructive pulmonary disease)       . Diabetes       . HTN (hypertension)   . Stroke       [2] Past Surgical History: Procedure Laterality Date  . ACDF    . KNEE SURGERY     knee replacements in both knees  . LEFT KNEE ARTHROSCOPY    . LEFT KNEE REPLACEMENT     . RIGHT KNEE ARTHROSCOPY    . SPINE SURGERY    [3] Family  History Problem Relation Age of Onset  . Cancer Mother   . Heart attack Mother   . Hypertension Mother   . Heart attack Father   . Hypertension Father   . Heart failure Brother   . Heart disease Brother   [4] No current facility-administered medications for this encounter.  Current Outpatient Medications:  .  acetaminophen  (TYLENOL  8 HOUR) 650 MG CR tablet, Take 1 tablet (650 mg total) by mouth every eight (8) hours as needed., Disp: , Rfl:  .  albuterol HFA 90 mcg/actuation inhaler, Inhale 1 puff., Disp: , Rfl:  .  ascorbic acid , vitamin C , (ASCORBIC ACID  WITH ROSE HIPS) 500 MG tablet, Take 1 tablet (500 mg total) by mouth daily., Disp: , Rfl:  .  cholecalciferol, vitamin D3, (VITAMIN D3) 1,000 unit capsule, Take 1 capsule (25 mcg total) by mouth daily., Disp: , Rfl:  .  clopidogrel  (PLAVIX ) 75 mg tablet, Take 1 tablet (75 mg total) by mouth daily., Disp: , Rfl:  .  cyanocobalamin  (VITAMIN B-12) 500 MCG tablet, Take 1 tablet (500 mcg total) by mouth daily., Disp: , Rfl:  .  losartan (COZAAR) 25 MG tablet, TAKE 1 TABLET (25 MG TOTAL) BY MOUTH DAILY., Disp: 90 tablet, Rfl: 2 .  metFORMIN (GLUCOPHAGE) 500 MG tablet, Take 1 tablet (500 mg total) by mouth in the morning and 1 tablet (500 mg total) in the evening. Take with meals., Disp: , Rfl:  .  omega 3-dha-epa-fish oil (FISH OIL) 1,200 (144-216) mg cap, Take 1,200 mg by mouth in the morning., Disp: , Rfl:  .  omega-3 acid ethyl esters (LOVAZA ) 1 gram capsule, Take 2 capsules (2 g total) by mouth., Disp: , Rfl:  .  oxybutynin (DITROPAN-XL) 5 MG 24 hr tablet, Take 1 tablet (5 mg total) by mouth daily., Disp: , Rfl:  .  pravastatin (PRAVACHOL) 40 MG tablet, Take 1 tablet (40 mg total) by mouth daily., Disp: , Rfl:  .  sertraline (ZOLOFT) 50 MG tablet, Take 1 tablet (50 mg total) by mouth daily., Disp: , Rfl:  .  torsemide (DEMADEX) 20 MG tablet, Take 1 tablet (20 mg total) by mouth daily., Disp: , Rfl:  .  triamcinolone  (KENALOG ) 0.1 %  ointment, Apply 1 Application topically daily as needed (for itchy legs)., Disp: , Rfl:  [5] Allergies Allergen Reactions  . Tizanidine   . Ciprofloxacin Rash

## 2024-02-08 NOTE — ACP (Advance Care Planning) (Signed)
 ADVANCE CARE PLANNING NOTE  Discussion Date:  February 08, 2024  Patient has decisional capacity:  Yes  Patient has selected a Health Care Decision-Maker if loses capacity: Yes  Health Care Decision Maker as of 02/08/2024; Holley Coe (daughter), [phone]   Discussion Participants: Pt and myself  Communication of Medical Status/Prognosis:  Yes  Communication of Treatment Goals/Options:  Discussed treatment plan, CODE STATUS  Treatment Decisions:  Patient agrees with the treatment plan, would like to be full code     I spent 4 minutes providing voluntary advance care planning services for this patient.

## 2024-02-08 NOTE — Progress Notes (Signed)
 ------------------------------------------------------------------------------- Attestation signed by Feliz Margart Fallow, DO at 02/10/24 0845 I was the supervising physician during time of service.  Case was discussed with APP and I agree with management as outlined below. -------------------------------------------------------------------------------   PROGRESS NOTE [facility] 02/08/24    Patient name: [name]. Rojek DOB 04-15-1941 MRN#: [id number] PCP: Orpha Yancey Skeens, MD Time: 1:11 PM Primary Care Provider:  Orpha Yancey Skeens, MD Inpatient primary attending provider: Brutus Franco Shank, FNP  _____________________________________  Admission HPI    Patient admitted on: 02/07/2024  6:51 PM  Patient admitted by: Jerilynn Gordy Given, MD    CHIEF COMPLAINT:  Dyspnea, cough and wheezing times 3 days.   Day of admission HPI:  [name]  is a 83 y.o. female with a PMH significant for COPD, HTN, HPL. CAD, and multiple comorbidities who presented with three day history of worsening cough, dyspnea, and wheezing. She notes some generalized weakness as well.  No fever or chills reported.  Denies chest pain.  No abdominal pain, nausea, vomiting or diarrhea.  Denies GU or neurologic symptoms.  Somewhat limited historian as she is hard of hearing and daughter is able to assist at bedside.  Not oxygen  dependent.   Admitted for treatment with IV steroids, oxygen  if needed, empiric antibx.     Patient admitted on Home O2? - no Patient on home anticoagulant? -  no Patient admitted with Chronic home foley catheter? - no Foley catheter placed or replaced by another service prior to admission? - no Central Line Status: NONE   Mental Status on Admission: The patient is Alert and oriented to PERSON The patient is Alert And oriented to TIME The patient is Alert and oriented to LOCATION  Today; patient is laying in bed awake and alert.  States her left-sided  weakness is from an old stroke.  Wheelchair at the bedside from home.  Patient lives at home by herself but has caretakers that come in and out of the home and very involved daughters.   Problem List, Assessment & Plan     ASSESSMENT & PLAN (In order of descending acuity)   COPD with acute exacerbation, possible pneumonia, shortness of breath No dependence on home oxygen  Empiric antibiotics to continue Receiving IV steroids due to wheezing Continue nebulizer treatments Saturating low 90s on room air Respiratory pathogen panel negative Symptomatic with cough 2 view chest x-ray from 8/28 at another facility shows patchy right perihilar and left basilar airspace opacities, unable to rule out developing bronchopneumonia I-S, Acapella  Type 2 diabetes A1c 6.8 Sliding scale insulin  with diabetic diet, 4 times daily Accu-Cheks Takes metformin 500 mg twice daily at home, currently holding  Metastatic carcinoma Recent biopsy of left inguinal lymph node and PET scan showing reactive axillary and inguinal lymph nodes Biopsy 8/29 at [facility], result pending Seeing [facility] History of melanoma  CAD, hyperlipidemia, previous CVA Continue Plavix , statin Residual left-sided weakness  Anxiety Continue Zoloft Patient calm  Heart failure preserved EF, chronic Last echo August 2024 showed EF greater than 70% Does not appear to be in acute exacerbation or have symptoms of volume overload Continue torsemide daily, losartan On the pathway  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  Will make attempts to contact patient's  family to update today.  Continue IV steroids and IV antibiotics empirically in this patient with  multiple comorbidities and at high risk for being immunocompromised secondary to active cancer process and planned treatment.  Discussed the patient's plan of care with Dr. Feliz attending.   ADDITIONAL NON-ACUTE FINDINGS, OBSERVATIONS, FAMILY DISCUSSIONS, ETC. (When present):   General; ill-appearing 83 year old female Cardiovascular; irregular rhythm, rate 80s Pulmonary; lungs coarse but clear bilaterally, mild wheezing Abdomen; soft, nontender, bowel sounds present all 4 quadrants Extremities; moves all extremities bilaterally, no edema Neuro; alert and oriented x 3, left side residual weakness previous CVA, no acute focal weakness   Incidental Findings for ourpatient Follow-Up: No significant incidental findings present    DVT Prophylaxis Ordered: SQ Enoxaparin  _____________________________________  Timeline of Significant Events: No notes on file   Temp:  [36.5 C (97.7 F)-37.2 C (98.9 F)] 36.8 C (98.2 F) Pulse:  [66-117] 89 SpO2 Pulse:  [75-114] 106 Resp:  [11-31] 17 BP: (113-192)/(69-102) 132/70 FiO2 (%):  [21 %] 21 % SpO2:  [84 %-98 %] 96 % Body mass index is 34.15 kg/m. Intake/Output last 3 shifts: I/O last 3 completed shifts: In: 80 [P.O.:80] Out: -   Consults Requested  IP CONSULT TO HOSPITALIST     In hospital Nutrition: Advance diet as tolerated Nutrition Therapy Regular/House    An advanced care planning discussion was had with patient and/or patient's decisions maker (documented separately).  CODE STATUS :                    Full Code   Discharge estimated within 1-2 days. Anticipated disposition:  To Home with Home Health ______________________________________________________________  Current Medications[1] ________________________________________________________________  Allergies  Allergen Reactions  . Tizanidine   . Ciprofloxacin Rash     Past Medical History[2]  Past Surgical History[3]   Family History[4]        Imaging   XR Chest Portable Result Date: 02/07/2024 Exam:  Portable Chest  History:  Chest pain, shortness of breath.  Technique:  Single frontal view.  Comparison:  February 27, 2023  Findings:   Cardiomediastinal silhouette is unchanged from prior when accounting for differences in positioning, rotation.  Lung volumes are low. No focal alveolar consolidation. No significant pleural effusion. Cervical spine fusion hardware. Degenerative changes of the shoulders bilaterally.    Low lung volumes. No focal alveolar consolidation.  Signed (Electronic Signature): 02/07/2024 7:42 PM Signed By: Ellouise Platt, MD   Lab Results   Recent Labs    02/07/24 2000  WBC 5.6  HGB 11.3*  HCT 35.3  PLT 208   Recent Labs    02/07/24 2000  NA 142  K 3.7  CL 102  CO2 31.3  BUN 7*  CREATININE 0.65  GLU 111  CALCIUM 9.0  ALBUMIN 3.9  PROT 7.4  BILITOT 0.4  AST 17  ALT 17  ALKPHOS 99   Recent Labs    02/07/24 2000  TROPONINI 17  INR 1.07   Recent Labs    02/07/24 2251  NITRITE Negative  LEUKOCYTESUR Negative  BLOODU Negative  GLUCOSEU Negative  PROTEINUA Trace*  KETONESU 20 mg/dL*   No results for input(s): OPIAU, BENZU, TRICYCLIC, PCPU, AMPHU, COCAU, CANNAU, BARBU, ETOH, ACETAMIN, SALICYLATE in the last 72 hours. No results for input(s): PREGTESTUR, PREGPOC in the last 72 hours. Recent Labs    02/07/24 2000  A1C 6.8*   Recent Labs    02/07/24 1946  PHART 7.45  PCO2ART 42.8  PO2ART 65*  HCO3ART  29.5*  O2SATART 93.5*  BEART 4.9*     Brutus FORBES Shank, FNP Hospitalist, [facility] 02/08/24, 1:11 PM      [1]  Current Facility-Administered Medications:  .  acetaminophen  (TYLENOL ) tablet 650 mg, 650 mg, Oral, Q8H PRN, Allyn Jerilynn Heinz, MD .  albuterol (PROVENTIL HFA;VENTOLIN HFA) 90 mcg/actuation inhaler 1 puff, 1 puff, Inhalation, Q4H PRN, Allyn Jerilynn Heinz, MD .  ascorbic acid  (vitamin C ) (VITAMIN C ) tablet 500 mg, 500 mg, Oral, Daily,  Allyn Jerilynn Heinz, MD, 500 mg at 02/08/24 9066 .  cefTRIAXone  (ROCEPHIN ) IVPB 1 g in 50 mL dextrose  (premix), 1 g, Intravenous, Q24H, Pace, Hagan Eggleston, FNP, Stopped at 02/07/24 2226 .  clopidogrel  (PLAVIX ) tablet 75 mg, 75 mg, Oral, Daily, Allyn Jerilynn Heinz, MD, 75 mg at 02/08/24 9066 .  cyanocobalamin  tablet 500 mcg, 500 mcg, Oral, Daily, Allyn Jerilynn Heinz, MD, 500 mcg at 02/08/24 0932 .  enoxaparin  (LOVENOX ) syringe 40 mg, 40 mg, Subcutaneous, Q24H SCH, Pace, Hagan Eggleston, FNP, 40 mg at 02/08/24 0932 .  ipratropium-albuterol (DUO-NEB) 0.5-2.5 mg/3 mL nebulizer solution 3 mL, 3 mL, Nebulization, Q6H (RT), Shank Brutus Bryant, FNP, 3 mL at 02/08/24 0931 .  losartan (COZAAR) tablet 25 mg, 25 mg, Oral, Daily, Allyn Jerilynn Heinz, MD, 25 mg at 02/08/24 0932 .  methylPREDNISolone  sodium succinate (SOLU-Medrol ) injection 40 mg, 40 mg, Intravenous, Q8H, Pace, Hagan Eggleston, FNP, 40 mg at 02/08/24 1134 .  nitroglycerin (NITROSTAT) SL tablet 0.4 mg, 0.4 mg, Sublingual, Q5 Min PRN, Allyn Jerilynn Heinz, MD .  pravastatin (PRAVACHOL) tablet 40 mg, 40 mg, Oral, Daily, Allyn Jerilynn Heinz, MD, 40 mg at 02/08/24 0933 .  sertraline (ZOLOFT) tablet 50 mg, 50 mg, Oral, Daily, Allyn Jerilynn Heinz, MD, 50 mg at 02/08/24 0932 .  torsemide (DEMADEX) tablet 20 mg, 20 mg, Oral, Daily, Allyn Jerilynn Heinz, MD, 20 mg at 02/08/24 9066 .  triamcinolone  (KENALOG ) 0.1 % ointment 1 Application, 1 Application, Topical, Daily PRN, Allyn Jerilynn Heinz, MD [2] Past Medical History: Diagnosis Date  . Cancer       . COPD (chronic obstructive pulmonary disease)       . Diabetes       . HTN (hypertension)   . Stroke       [3] Past Surgical History: Procedure Laterality Date  . ACDF    . KNEE SURGERY     knee replacements in both knees  . LEFT KNEE ARTHROSCOPY    . LEFT KNEE REPLACEMENT     . RIGHT KNEE ARTHROSCOPY    . SPINE SURGERY    [4] Family  History Problem Relation Age of Onset  . Cancer Mother   . Heart attack Mother   . Hypertension Mother   . Heart attack Father   . Hypertension Father   . Heart failure Brother   . Heart disease Brother

## 2024-02-09 DIAGNOSIS — C799 Secondary malignant neoplasm of unspecified site: Secondary | ICD-10-CM | POA: Diagnosis not present

## 2024-02-09 DIAGNOSIS — E119 Type 2 diabetes mellitus without complications: Secondary | ICD-10-CM | POA: Diagnosis not present

## 2024-02-09 DIAGNOSIS — I5032 Chronic diastolic (congestive) heart failure: Secondary | ICD-10-CM | POA: Diagnosis not present

## 2024-02-09 DIAGNOSIS — E785 Hyperlipidemia, unspecified: Secondary | ICD-10-CM | POA: Diagnosis not present

## 2024-02-09 DIAGNOSIS — I2510 Atherosclerotic heart disease of native coronary artery without angina pectoris: Secondary | ICD-10-CM | POA: Diagnosis not present

## 2024-02-09 DIAGNOSIS — Z7984 Long term (current) use of oral hypoglycemic drugs: Secondary | ICD-10-CM | POA: Diagnosis not present

## 2024-02-09 DIAGNOSIS — Z993 Dependence on wheelchair: Secondary | ICD-10-CM | POA: Diagnosis not present

## 2024-02-09 DIAGNOSIS — Z79899 Other long term (current) drug therapy: Secondary | ICD-10-CM | POA: Diagnosis not present

## 2024-02-09 DIAGNOSIS — J441 Chronic obstructive pulmonary disease with (acute) exacerbation: Secondary | ICD-10-CM | POA: Diagnosis not present

## 2024-02-09 DIAGNOSIS — R54 Age-related physical debility: Secondary | ICD-10-CM | POA: Diagnosis not present

## 2024-02-09 NOTE — Discharge Summary (Signed)
 ------------------------------------------------------------------------------- Attestation signed by Feliz Margart Fallow, DO at 02/10/24 [phone] I was the supervising physician during time of service.  Case was discussed with APP and I agree with management as outlined below. -------------------------------------------------------------------------------   DISCHARGE SUMMARY [facility] [address]   Discharge date:   February 09, 2024 Length of stay:    LOS: 2 days    Discharge Service:   [facility] Hospitalists Discharge Attending Physician: Brutus Franco Shank, FNP Discharge to:    To Home with Home Health Condition at Discharge:  stable Code status:                         Full Code    ______________________________________   Admission HPI    Patient admitted on: 02/07/2024  6:51 PM  Patient admitted by: Jerilynn Gordy Given, MD    CHIEF COMPLAINT:  Dyspnea, cough and wheezing times 3 days.   Day of admission HPI:  [name]  is a 83 y.o. female with a PMH significant for COPD, HTN, HPL. CAD, and multiple comorbidities who presented with three day history of worsening cough, dyspnea, and wheezing. She notes some generalized weakness as well.  No fever or chills reported.  Denies chest pain.  No abdominal pain, nausea, vomiting or diarrhea.  Denies GU or neurologic symptoms.  Somewhat limited historian as she is hard of hearing and daughter is able to assist at bedside.  Not oxygen  dependent.   Admitted for treatment with IV steroids, oxygen  if needed, empiric antibx.     Patient admitted on Home O2? - no Patient on home anticoagulant? -  no Patient admitted with Chronic home foley catheter? - no Foley catheter placed or replaced by another service prior to admission? - no Central Line Status: NONE   Mental Status on Admission: The patient is Alert and oriented to PERSON The patient is Alert And oriented to TIME The patient is Alert and oriented to LOCATION    Today; patient is laying in bed awake and alert.  Daughter at bedside helping her eat.  Feels she is ready to go home today.  Has appointments later this week for Port-A-Cath and initiation of chemo.   Problem List, Assessment & Plan     ASSESSMENT & PLAN (In order of descending acuity)   COPD with acute exacerbation, possible pneumonia, shortness of breath No dependence on home oxygen  Empiric antibiotics to continue Received IV steroids due to wheezing; send home with prednisone  taper Continue nebulizer treatments at home twice a day for a few days Saturating 97% on room air Respiratory pathogen panel negative Symptomatic with cough but much improved 2 view chest x-ray from 8/28 at another facility shows patchy right perihilar and left basilar airspace opacities, unable to rule out developing bronchopneumonia I-S, Acapella   Type 2 diabetes A1c 6.8 Sliding scale insulin  with diabetic diet, 4 times daily Accu-Cheks Takes metformin 500 mg twice daily at home, currently holding   Metastatic carcinoma Recent biopsy of left inguinal lymph node and PET scan showing reactive axillary and inguinal lymph nodes Biopsy 8/29 at [facility], result pending Seeing [facility] History of melanoma   CAD, hyperlipidemia, previous CVA Continue Plavix , statin Residual left-sided weakness   Anxiety Continue Zoloft Patient calm   Heart failure preserved EF, chronic Last echo August 2024 showed EF greater than 70% Does not appear to be in acute exacerbation or have symptoms of volume overload Continue torsemide daily, losartan On the pathway  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  Will prescribe Augmentin x  5 days as well as a prednisone  taper and nebulizer solution, case management will get patient a nebulizer machine and she can use it twice a day for the next few days.  Discussed discharge plan with the patient and her daughter at the bedside.  Discharge plan discussed with Dr. Feliz attending.  Discharge home with home health.   ADDITIONAL NON-ACUTE FINDINGS, OBSERVATIONS, FAMILY DISCUSSIONS, ETC. (When present):   General; ill-appearing 83 year old female Cardiovascular; irregular rhythm, rate 80s Pulmonary; lungs coarse but clear bilaterally, mild wheezing in the bases, saturating well on RA and no increased breathing effort Abdomen; soft, nontender, bowel sounds present all 4 quadrants Extremities; moves all extremities bilaterally, no edema Neuro; alert and oriented x 3, left side residual weakness previous CVA, no acute focal weakness   Incidental Findings for ourpatient Follow-Up: No significant incidental findings present    DVT Prophylaxis Ordered: SQ Enoxaparin  ______________________________________  Timeline of Significant Events: No notes on file   Mental Status On day of Discharge:  The patient is Alert and oriented to PERSON The patient is Alert And oriented to TIME The patient is Alert and oriented to LOCATION  CODE STATUS :                    Full Code   An advanced care planning discussion was  had with patient and/or patient's decisions maker (documented separately).  Patient discharged on Home O2? - no Patient discharged on home anticoagulant? -  no  Foley Catheter status: None Central Line Status: NONE  Time Spent on Discharge I spent greater than 30 minutes counseling and coordinating care for the discharge of this patient. The pt and her daughter and I discussed the importance of outpatient follow-up as well as concerning signs and symptoms that would require immediate evaluation by a medical professional. The aforementioned conversation participants  understand  and did show insight. I did use teachback to ensure understanding. The above participant/s is aware that not following the discussed plan, recommendations, and follow up can lead to severe negative effects on the patient's health, up to and including death.  Discharge Medications     Your Medication List     STOP taking these medications    doxycycline  100 MG tablet Commonly known as: VIBRA -TABS   triamcinolone  0.1 % ointment Commonly known as: KENALOG        START taking these medications    amoxicillin-clavulanate 875-125 mg per tablet Commonly known as: AUGMENTIN Take 1 tablet by mouth two (2) times a day for 5 days.   ipratropium-albuterol 0.5-2.5 mg/3 mL nebulizer Commonly known as: DUO-NEB Inhale 3 mL by nebulization every six (6) hours as needed.   predniSONE  20 MG tablet Commonly known as: DELTASONE  Take 2 tablets (40 mg total) by mouth daily for 3 days, THEN 1 tablet (20 mg total) daily for 3 days, THEN 0.5 tablets (10 mg total) daily for 4 days. Start taking on: February 10, 2024       CONTINUE taking these medications    acetaminophen  650 MG CR tablet Commonly known as: TYLENOL  8 HOUR Take 1 tablet (650 mg total) by mouth every eight (8) hours as  needed.   albuterol 90 mcg/actuation inhaler Commonly known as: PROVENTIL HFA;VENTOLIN HFA Inhale 1 puff.   ascorbic acid  500 MG tablet Generic drug: ascorbic acid  (vitamin C ) Take 1 tablet (500 mg total) by mouth in the morning.   clopidogrel  75 mg tablet Commonly known as: PLAVIX  Take 1 tablet (75 mg total) by mouth in the morning.   Fish Oil 1,200 (144-216) mg Cap Generic drug: omega 3-dha-epa-fish oil Take 1,200 mg by mouth in the morning.   losartan 25 MG tablet Commonly known as: COZAAR TAKE 1 TABLET (25 MG TOTAL) BY MOUTH DAILY.   metFORMIN 500 MG tablet Commonly known as: GLUCOPHAGE Take 1 tablet (500 mg total) by mouth in the morning and 1 tablet (500 mg total) in the evening.  Take with meals.   nitroglycerin 0.4 MG SL tablet Commonly known as: NITROSTAT Place 1 tablet (0.4 mg total) under the tongue every five (5) minutes as needed for chest pain. Maximum of 3 doses in 15 minutes.   omega-3 acid ethyl esters 1 gram capsule Commonly known as: LOVAZA  Take 2 capsules (2 g total) by mouth.   oxybutynin 5 MG 24 hr tablet Commonly known as: DITROPAN-XL Take 1 tablet (5 mg total) by mouth daily.   pravastatin 40 MG tablet Commonly known as: PRAVACHOL Take 1 tablet (40 mg total) by mouth in the morning.   sertraline 50 MG tablet Commonly known as: ZOLOFT Take 1 tablet (50 mg total) by mouth daily.   torsemide 20 MG tablet Commonly known as: DEMADEX Take 1 tablet (20 mg total) by mouth in the morning.   vitamin B-12 500 MCG tablet Generic drug: cyanocobalamin  Take 1 tablet (500 mcg total) by mouth in the morning.   VITAMIN D3 25 mcg (1,000 unit) capsule Generic drug: cholecalciferol (vitamin D3-25 mcg (1,000 unit)) Take 1 capsule (25 mcg total) by mouth in the morning.       _____________________________________  Nutrition:                                  Diet Instructions     Discharge diet (specify)     Discharge Nutrition Therapy: Consistent Carb   Consistent Carb Level: Consistent Carb 60/60/60 (4/4/4)                     ___________________________________________  Discharge Instructions   Nutrition:                                  Diet Instructions     Discharge diet (specify)     Discharge Nutrition Therapy: Consistent Carb   Consistent Carb Level: Consistent Carb 60/60/60 (4/4/4)       Activity:                                   Activity Instructions     Activity as tolerated         Appointments:                          Follow Up:                              Follow Up instructions and Outpatient Referrals  Call MD for:  persistent nausea or vomiting     Call MD for:  severe uncontrolled pain     Call MD  for: Temperature > 38.5 Celsius ( > 101.3 Fahrenheit)     Discharge instructions         Allergies  Allergen Reactions  . Tizanidine   . Ciprofloxacin Rash     Past Medical History[1]  Past Surgical History[2]   Family History[3]   Current Medications[4]  Imaging  ECG 12 Lead Result Date: 02/08/2024 Sinus rhythm with 1st degree AV block Inferior infarct , age undetermined Abnormal ECG When compared with ECG of 07-Feb-2024 19:30, Inferior infarct is now present  XR Chest Portable Result Date: 02/07/2024 Exam:  Portable Chest  History:  Chest pain, shortness of breath.  Technique:  Single frontal view.  Comparison:  February 27, 2023  Findings:   Cardiomediastinal silhouette is unchanged from prior when accounting for differences in positioning, rotation.  Lung volumes are low. No focal alveolar consolidation. No significant pleural effusion. Cervical spine fusion hardware. Degenerative changes of the shoulders bilaterally.    Low lung volumes. No focal alveolar consolidation.  Signed (Electronic Signature): 02/07/2024 7:42 PM Signed By: Ellouise Platt, MD   Lab Results   Recent Labs    02/09/24 0348  WBC 4.9  HGB 10.5*  HCT 32.4*  PLT 190   Recent Labs    02/07/24 2000 02/09/24 0348  NA 142 139  K 3.7 3.8  CL 102 100  CO2 31.3 28.8  BUN 7* 14  CREATININE 0.65 0.81  GLU 111 163  CALCIUM 9.0 8.9  ALBUMIN 3.9  --   PROT 7.4  --   BILITOT 0.4  --   AST 17  --   ALT 17  --   ALKPHOS 99  --   MG  --  1.9   Recent Labs    02/07/24 2000  TROPONINI 17  INR 1.07   Recent Labs    02/07/24 2251  NITRITE Negative  LEUKOCYTESUR Negative  BLOODU Negative  GLUCOSEU Negative  PROTEINUA Trace*  KETONESU 20 mg/dL*   No results for input(s): OPIAU, BENZU, TRICYCLIC, PCPU, AMPHU, COCAU, CANNAU, BARBU, ETOH, ACETAMIN, SALICYLATE in the last 72 hours. No results for input(s): PREGTESTUR, PREGPOC in the last 72 hours. Recent Labs     02/07/24 2000  A1C 6.8*   Recent Labs    02/07/24 1946  PHART 7.45  PCO2ART 42.8  PO2ART 65*  HCO3ART 29.5*  O2SATART 93.5*  BEART 4.9*     Home Medications   Prior to Admission medications  Medication Dose, Route, Frequency  ascorbic acid , vitamin C , (ASCORBIC ACID  WITH ROSE HIPS) 500 MG tablet 500 mg, Daily (standard)  cholecalciferol, vitamin D3, (VITAMIN D3) 1,000 unit capsule 1,000 Units, Daily (standard)  clopidogrel  (PLAVIX ) 75 mg tablet 75 mg, Daily (standard)  cyanocobalamin  (VITAMIN B-12) 500 MCG tablet 500 mcg, Daily (standard)  doxycycline  (VIBRA -TABS) 100 MG tablet 100 mg, 2 times a day (standard)  losartan (COZAAR) 25 MG tablet 25 mg, Oral, Daily (standard)  metFORMIN (GLUCOPHAGE) 500 MG tablet 500 mg, 2 times a day with meals  omega 3-dha-epa-fish oil (FISH OIL) 1,200 (144-216) mg cap 1,200 mg, Daily  pravastatin (PRAVACHOL) 40 MG tablet 40 mg, Daily (standard)  sertraline (ZOLOFT) 50 MG tablet 50 mg, Daily (standard)  torsemide (DEMADEX) 20 MG tablet 20 mg, Daily (standard)  acetaminophen  (TYLENOL  8 HOUR) 650 MG CR tablet 650 mg, Every 8 hours PRN  albuterol HFA 90  mcg/actuation inhaler 1 puff  amoxicillin-clavulanate (AUGMENTIN) 875-125 mg per tablet 1 tablet, Oral, 2 times a day (standard)  ipratropium-albuterol (DUO-NEB) 0.5-2.5 mg/3 mL nebulizer 3 mL, Nebulization, Every 6 hours PRN  nitroglycerin (NITROSTAT) 0.4 MG SL tablet 0.4 mg, Sublingual, Every 5 min PRN, Maximum of 3 doses in 15 minutes.  omega-3 acid ethyl esters (LOVAZA ) 1 gram capsule 2 g, Oral  oxybutynin (DITROPAN-XL) 5 MG 24 hr tablet 5 mg, Daily (standard)  predniSONE  (DELTASONE ) 20 MG tablet Take 2 tablets (40 mg total) by mouth daily for 3 days, THEN 1 tablet (20 mg total) daily for 3 days, THEN 0.5 tablets (10 mg total) daily for 4 days.  triamcinolone  (KENALOG ) 0.1 % ointment 1 Application, Daily PRN Patient not taking: Reported on 02/07/2024   Brutus FORBES Shank, FNP Hospitalist,  [facility] 02/09/24, 10:17 AM       [1] Past Medical History: Diagnosis Date  . Cancer       . COPD (chronic obstructive pulmonary disease)       . Diabetes       . HTN (hypertension)   . Stroke       [2] Past Surgical History: Procedure Laterality Date  . ACDF    . KNEE SURGERY     knee replacements in both knees  . LEFT KNEE ARTHROSCOPY    . LEFT KNEE REPLACEMENT     . RIGHT KNEE ARTHROSCOPY    . SPINE SURGERY    [3] Family History Problem Relation Age of Onset  . Cancer Mother   . Heart attack Mother   . Hypertension Mother   . Heart attack Father   . Hypertension Father   . Heart failure Brother   . Heart disease Brother   [4]  Current Facility-Administered Medications:  .  acetaminophen  (TYLENOL ) tablet 650 mg, 650 mg, Oral, Q8H PRN, Allyn Jerilynn Heinz, MD .  albuterol (PROVENTIL HFA;VENTOLIN HFA) 90 mcg/actuation inhaler 1 puff, 1 puff, Inhalation, Q4H PRN, Allyn Jerilynn Heinz, MD .  ascorbic acid  (vitamin C ) (VITAMIN C ) tablet 500 mg, 500 mg, Oral, Daily, Allyn Jerilynn Heinz, MD, 500 mg at 02/09/24 9165 .  cefTRIAXone  (ROCEPHIN ) IVPB 1 g in 50 mL dextrose  (premix), 1 g, Intravenous, Q24H, Pace, Hagan Eggleston, FNP, Stopped at 02/08/24 2316 .  clopidogrel  (PLAVIX ) tablet 75 mg, 75 mg, Oral, Daily, Allyn Jerilynn Heinz, MD, 75 mg at 02/09/24 9164 .  cyanocobalamin  tablet 500 mcg, 500 mcg, Oral, Daily, Allyn Jerilynn Heinz, MD, 500 mcg at 02/09/24 0834 .  dextrose  (GLUTOSE) 40 % gel 15 g of dextrose , 15 g of dextrose , Oral, Q10 Min PRN, Pace, Brutus Bryant, FNP .  dextrose  50 % in water (D50W) 50 % solution 12.5 g, 12.5 g, Intravenous, Q15 Min PRN, Pace, Brutus Bryant, FNP .  doxycycline  (VIBRAMYCIN ) 100 mg in sodium chloride  0.9 % (NS) 100 mL IVPB-connector bag, 100 mg, Intravenous, Q12H, Pace, Hagan Eggleston, FNP, Stopped at 02/09/24 [phone] .  enoxaparin  (LOVENOX ) syringe 40 mg, 40 mg, Subcutaneous, Q24H SCH, Pace, Hagan Eggleston, FNP, 40  mg at 02/09/24 0835 .  glucagon injection 1 mg, 1 mg, Intramuscular, Once PRN, Pace, Hagan Eggleston, FNP .  guaiFENesin (ROBITUSSIN) oral syrup, 200 mg, Oral, Q4H PRN, Allyn Jerilynn Heinz, MD, 200 mg at 02/09/24 0236 .  insulin  lispro (HumaLOG) injection CORRECTIONAL 0-20 Units, 0-20 Units, Subcutaneous, ACHS, Pace, Hagan Eggleston, FNP, 3 Units at 02/09/24 [phone] .  ipratropium-albuterol (DUO-NEB) 0.5-2.5 mg/3 mL nebulizer solution 3 mL, 3 mL, Nebulization, Q6H (RT), Shank, Hagan  Eggleston, FNP, 3 mL at 02/09/24 0926 .  losartan (COZAAR) tablet 25 mg, 25 mg, Oral, Daily, Allyn Jerilynn Heinz, MD, 25 mg at 02/09/24 0834 .  methylPREDNISolone  sodium succinate (SOLU-Medrol ) injection 40 mg, 40 mg, Intravenous, Q8H, Pace, Hagan Eggleston, FNP, 40 mg at 02/09/24 0549 .  nitroglycerin (NITROSTAT) SL tablet 0.4 mg, 0.4 mg, Sublingual, Q5 Min PRN, Allyn Jerilynn Heinz, MD .  pravastatin (PRAVACHOL) tablet 40 mg, 40 mg, Oral, Daily, Allyn Jerilynn Heinz, MD, 40 mg at 02/09/24 0834 .  sertraline (ZOLOFT) tablet 50 mg, 50 mg, Oral, Daily, Allyn Jerilynn Heinz, MD, 50 mg at 02/09/24 0834 .  torsemide (DEMADEX) tablet 20 mg, 20 mg, Oral, Daily, Allyn Jerilynn Heinz, MD, 20 mg at 02/09/24 0834 .  triamcinolone  (KENALOG ) 0.1 % ointment 1 Application, 1 Application, Topical, Daily PRN, Allyn Jerilynn Heinz, MD

## 2024-02-09 NOTE — Progress Notes (Signed)
 Patient requires nebulizer for breathing treatments in the home. Patient has history of COPD and requires nebulized medicine to open airways.

## 2024-02-09 NOTE — Nursing Note (Signed)
   02/09/24 1550  Final Assessment  Patient's Post Acute Contact Information see demo  Has a PCP appointment been made? No  Has a specialist appointment been made? No  Post Acute Facility needed at discharge? No  Home Care/ Home Medical Equipment needed at discharge? Yes  Home Care/ Home Medical Equipment [facility]) (specify);Home Health (specify)  Home Health Provider (Name/Phone #) Referred to [facility]  HME Agency (Name/Phone #) [facility]  Outpatient/Community Referrals needed for discharge? Yes  Outpatient/[facility]  Agency detail (Name/Phone #) [facility]  Discharge Disposition Home w/ Self Care  Transportation Anticipated family or friend will provide  Quality data for continuing care services shared with patient and/or representative? Yes  Patient and/or family were provided with choice of facilities / services that are available and appropriate to meet post hospital care needs? N/A  IMM Delivery  Follow Up Important Message (IM) letter given to patient and/or family? N/A  Final Assessment Complete  Final Assessment Complete Yes

## 2024-02-09 NOTE — Progress Notes (Signed)
 Discharge instructions along with home medication list and follow up gone over with patient and daughter. Both verbalized understanding. Rx electronically submitted. Iv remove. Patient discharged on RA. No distress noted. Patient transported off floor via personal wheelchair by staff.

## 2024-02-10 ENCOUNTER — Inpatient Hospital Stay

## 2024-02-10 ENCOUNTER — Telehealth: Payer: Self-pay

## 2024-02-10 DIAGNOSIS — Z5112 Encounter for antineoplastic immunotherapy: Secondary | ICD-10-CM | POA: Insufficient documentation

## 2024-02-10 DIAGNOSIS — C4492 Squamous cell carcinoma of skin, unspecified: Secondary | ICD-10-CM | POA: Insufficient documentation

## 2024-02-10 DIAGNOSIS — C778 Secondary and unspecified malignant neoplasm of lymph nodes of multiple regions: Secondary | ICD-10-CM | POA: Insufficient documentation

## 2024-02-10 DIAGNOSIS — Z1509 Genetic susceptibility to other malignant neoplasm: Secondary | ICD-10-CM | POA: Insufficient documentation

## 2024-02-10 DIAGNOSIS — Z79620 Long term (current) use of immunosuppressive biologic: Secondary | ICD-10-CM | POA: Insufficient documentation

## 2024-02-10 NOTE — Transitions of Care (Post Inpatient/ED Visit) (Signed)
 02/10/2024  Name: [name] MRN: [id number] DOB: [birth year]  Today's TOC FU Call Status: Today's TOC FU Call Status:: Successful TOC FU Call Completed TOC FU Call Complete Date: 02/10/24 Patient's Name and Date of Birth confirmed.  Transition Care Management Follow-up Telephone Call Date of Discharge: 02/09/24 Discharge Facility: Other [facility]) Name of Other (Non[facility]) Discharge Facility: [facility] Type of Discharge: Inpatient Admission Primary Inpatient Discharge Diagnosis:: COPD exacerbation How have you been since you were released from the hospital?: Worse (More coughing, lots of mucous) Any questions or concerns?: No  Items Reviewed: Did you receive and understand the discharge instructions provided?: Yes Medications obtained,verified, and reconciled?: Yes (Medications Reviewed) Any new allergies since your discharge?: No Dietary orders reviewed?: Yes Type of Diet Ordered:: carb modified diet Do you have support at home?: Yes People in Home [RPT]: child(ren), adult Name of Support/Comfort Primary Source: Pam- daughter  Medications Reviewed Today: Medications Reviewed Today     Reviewed by Rumalda Alan PENNER, RN (Registered Nurse) on 02/10/24 at 1630  Med List Status: <None>   Medication Order Taking? Sig Documenting Provider Last Dose Status Informant  albuterol (VENTOLIN HFA) 108 (90 Base) MCG/ACT inhaler [id number] Yes Inhale 1 puff into the lungs every 4 (four) hours as needed for shortness of breath. [provider]  Active Self  amoxicillin-clavulanate (AUGMENTIN) 875-125 MG tablet [id number] Yes Take 1 tablet by mouth 2 (two) times daily. For 5 days. [provider]  Active   aspirin  EC 81 MG tablet [id number]  Take 81 mg by mouth daily.  Patient not taking: Reported on 02/10/2024   [provider]  Active Self  benazepril -hydrochlorthiazide (LOTENSIN  HCT) 20-25 MG tablet [id number]  Hold this medication until you follow up with  PCP because your blood pressure was too low.  Patient not taking: Reported on 02/10/2024   Awanda City, MD  Active Self  Cholecalciferol (VITAMIN D3) 25 MCG (1000 UT) CAPS [id number] Yes Take 1 capsule by mouth daily at 6 (six) AM. [provider]  Active   clopidogrel  (PLAVIX ) 75 MG tablet [id number] Yes Take 75 mg by mouth daily. [provider]  Active            Med Note JAUN KRABBE M   Thu May 20, 2019  9:54 AM)    doxycycline  (VIBRA -TABS) 100 MG tablet [id number]  Take 1 tablet (100 mg total) by mouth 2 (two) times daily.  Patient not taking: Reported on 02/10/2024   Tina Pauletta BROCKS, MD  Active   Ferrous Sulfate (IRON ) 325 (65 FE) MG TABS [id number]  Take 65 mg by mouth. Occasionally  Patient not taking: Reported on 02/10/2024   [provider]  Active Self           Med Note ABE DELON VEAR [place] Nov 21, 2011 11:02 AM) Monday and thursday  fish oil-omega-3 fatty acids 1000 MG capsule [id number] Yes Take 2 g by mouth daily. [provider]  Active Self  gabapentin (NEURONTIN) 100 MG capsule [id number]  Take 100 mg by mouth 2 (two) times daily.  Patient not taking: Reported on 02/10/2024   [provider]  Active Self  gabapentin (NEURONTIN) 300 MG capsule [id number]  Take 300 mg by mouth 3 (three) times daily.  Patient not taking: Reported on 02/10/2024   [provider]  Active Self  guaiFENesin (MUCINEX) 600 MG 12 hr tablet [id number] Yes Take 600 mg by mouth 2 (two) times daily. [provider]  Active   hydrochlorothiazide  (HYDRODIURIL ) 25 MG tablet [id number]  Take by mouth.  Patient not taking: Reported on 02/10/2024   [provider]  Active Self  ipratropium-albuterol (DUONEB) 0.5-2.5 (3) MG/3ML SOLN [id number] Yes Take 3 mLs by nebulization every 6 (six) hours as needed. [provider]  Active   labetalol  (NORMODYNE ) 300 MG tablet [id number]  Take 300 mg by mouth 2 (two) times daily.  Patient not taking:  Reported on 02/10/2024   [provider]  Active Self  lidocaine -prilocaine  (EMLA ) cream [id number] Yes Apply 1 Application topically as needed. Tina Pauletta BROCKS, MD  Active            Med Note PINKY, BESSIE R   Fri Feb 06, 2024  9:54 AM) Has not started yet  lisinopril  (ZESTRIL ) 20 MG tablet [id number]  Take by mouth.  Patient not taking: Reported on 02/10/2024   [provider]  Active Self  LOSARTAN POTASSIUM PO [id number] Yes Take 25 mg by mouth daily. [provider]  Active   metFORMIN (GLUCOPHAGE) 500 MG tablet [id number] Yes Take 1 tablet by mouth 2 (two) times daily. [provider]  Active Self  Multiple Vitamin (MULTIVITAMIN) tablet [id number]  Take 1 tablet by mouth daily.  Patient not taking: Reported on 02/10/2024   [provider]  Active Self  Multiple Vitamins-Minerals (VITAMIN D3 COMPLETE PO) [id number]  Take 1 tablet by mouth daily.  Patient not taking: Reported on 02/10/2024   [provider]  Active   NIFEdipine (ADALAT CC) 30 MG 24 hr tablet [id number]  Take by mouth.  Patient not taking: Reported on 02/10/2024   [provider]  Active Self  nitroGLYCERIN (NITROSTAT) 0.4 MG SL tablet [id number] Yes Place 0.4 mg under the tongue every 5 (five) minutes as needed for chest pain. [provider]  Active   ondansetron  (ZOFRAN ) 8 MG tablet [id number] Yes Take 1 tablet (8 mg total) by mouth every 8 (eight) hours as needed for nausea or vomiting. Tina Pauletta BROCKS, MD  Active   pravastatin (PRAVACHOL) 40 MG tablet [id number] Yes Take 40 mg by mouth daily. [provider]  Active Self  predniSONE  (DELTASONE ) 20 MG tablet [id number] Yes Take 20 mg by mouth daily with breakfast. Take 2 tablets for 3 days, Take 1 tablet for 3 days Take a half tablet daily [provider]  Active   prochlorperazine  (COMPAZINE ) 10 MG tablet [id number]  Take 1 tablet (10 mg total) by mouth every 6 (six) hours as needed for nausea or  vomiting.  Patient not taking: Reported on 02/10/2024   Tina Pauletta BROCKS, MD  Active   sertraline (ZOLOFT) 50 MG tablet [id number] Yes Take 50 mg by mouth daily. [provider]  Active   tiZANidine (ZANAFLEX) 2 MG tablet [id number]  Take by mouth.  Patient not taking: Reported on 02/10/2024   [provider]  Active Self  TORSEMIDE PO [id number] Yes Take 20 mg by mouth daily. [provider]  Active   vitamin B-12 (CYANOCOBALAMIN ) 100 MCG tablet [id number] Yes Take 100 mcg by mouth daily. [provider]  Active Self  vitamin C  (ASCORBIC ACID ) 500 MG tablet [id number] Yes Take 500 mg by mouth daily. [provider]  Active   VITAMIN E  PO [id number]  Take 1 tablet by mouth daily.  Patient not taking: Reported on 02/10/2024   [provider]  Active             Home Care and  Equipment/Supplies: Were Home Health Services Ordered?: Yes Name of Home Health Agency:: [facility] Has Agency set up a time to come to your home?: No Any new equipment or medical supplies ordered?: Yes Name of Medical supply agency?: Neb machine from [employer] Were you able to get the equipment/medical supplies?: Yes Do you have any questions related to the use of the equipment/supplies?: No  Functional Questionnaire: Do you need assistance with bathing/showering or dressing?: No Do you need assistance with meal preparation?: Yes (caregivers and daughters) Do you need assistance with eating?: No Do you have difficulty maintaining continence: Yes (wears depends, BSC) Do you need assistance with getting out of bed/getting out of a chair/moving?: Yes (uses a STEADY) Do you have difficulty managing or taking your medications?: Yes (Daughter)  Follow up appointments reviewed: PCP Follow-up appointment confirmed?: No (Daughter to call and scheduled asap) MD Provider Line Number:[phone] Given: No Specialist Hospital Follow-up appointment confirmed?: Yes Date  of Specialist follow-up appointment?: 02/13/24 Follow-Up Specialty Provider:: Cancer center. Daughter needs to change appointment Do you need transportation to your follow-up appointment?: No Do you understand care options if your condition(s) worsen?: Yes-patient verbalized understanding  SDOH Interventions Today    Flowsheet Row Most Recent Value  SDOH Interventions   Food Insecurity Interventions Intervention Not Indicated  Housing Interventions Intervention Not Indicated  Transportation Interventions Intervention Not Indicated  Utilities Interventions Intervention Not Indicated   Phone call with the daughter (on the [place])  Daughter reports that patient has been more short of breath today.  Did not come home with nebulizer machine. Daughter reports caregiver went and purchased one for patient. Daughter reports she will figure out how to use it. I reviewed the steps of how to use nebulizer machine.  Reviewed and updated medication list per discharge instructions. Reviewed antibiotics and importance of taking with foods.   Reviewed importance of timely follow up with PCP. Reviewed that patient has 2 private caregivers from 8-5. Otherwise patient has family assisting for 24 hour care.  Reviewed with daughter that patient is not able to walk. Daughter and caregivers use a STEADY to move patient.  Assessed ongoing plan of care with oncology. Reviewed and provided contact information for home health ( [facility])  Reviewed and offered 30 day TOC program and daughter declined. Provided my contact information for daughter to call me if needed.  Reviewed when to call 911.   Alan Ee, RN, BSN, CEN [employer]- Transition of Care Team.  [facility] [phone]

## 2024-02-10 NOTE — Progress Notes (Signed)
 [facility] Clinical Social Work  Initial Assessment   [name] is a 83 y.o. year old female contacted caregiver by phone. Clinical Social Work was referred by distress screen protocol for distress screen needs and assessment of psychosocial needs.   SDOH (Social Determinants of Health) assessments performed: No SDOH Interventions    Flowsheet Row Office Visit from 01/20/2024 in [facility] [place]  SDOH Interventions   Food Insecurity Interventions Intervention Not Indicated  Housing Interventions Intervention Not Indicated  Transportation Interventions Intervention Not Indicated  Utilities Interventions Intervention Not Indicated    SDOH Screenings   Food Insecurity: No Food Insecurity (01/20/2024)  Housing: Low Risk  (01/20/2024)  Transportation Needs: No Transportation Needs (01/20/2024)  Utilities: Not At Risk (01/20/2024)  Depression (PHQ2-9): Low Risk  (01/20/2024)  Financial Resource Strain: Low Risk  (01/15/2023)   Received from [facility]  Physical Activity: Inactive (05/24/2022)   Received from [facility]  Social Connections: Moderately Integrated (05/24/2022)   Received from [facility]  Stress: No Stress Concern Present (05/24/2022)   Received from [facility]  Tobacco Use: Low Risk  (02/06/2024)  Health Literacy: Medium Risk (05/24/2022)   Received from [facility]    PHQ 2/9:    01/20/2024    1:02 PM 03/25/2018   10:48 AM 11/13/2011   11:40 AM  Depression screen PHQ 2/9  Decreased Interest 0 0 0  Down, Depressed, Hopeless 0 0 0  PHQ - 2 Score 0 0 0      Data saved with a previous flowsheet row definition     Distress Screen completed: No    02/05/2024    1:14 PM  ONCBCN DISTRESS SCREENING  Screening Type Initial Screening  How much distress have you been experiencing in the past week? (0-10) 7  Emotional concerns type Worry or anxiety  Physical Concerns Type  Pain;Fatigue;Memory or  concentration;Loss or change of physical abilities      Family/Social Information:  Housing Arrangement: [name] lives in [place] and alternates between her daughter's home due to the need for 24 hour care.  Family members/support persons in your life? Family Transportation concerns: no, [name]'s daughter Holley will be transporting her to [employer]. No transportation barriers noted.   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 [name] understands in general what the treatment plan, however, her daughters are well aware.  Concerns about diagnosis and/or treatment: No concerns noted Patient reported stressors: No stressors noted    SUMMARY: Current SDOH Barriers:  No SDOH barriers identified.   Clinical Social Work Clinical Goal(s):  No clinical social work goals at this time. No SDOH barriers identified. No levels of distress noted.  Interventions: Informed patient of the support team roles and support services at [facility] Provided CSW contact information and encouraged patient to call with any questions or concerns    Follow Up Plan: Patient will contact CSW with any support or resource needs Patient verbalizes understanding of plan: Yes    Vanessa Sprague, LCSW Clinical Social Worker [facility]

## 2024-02-11 ENCOUNTER — Other Ambulatory Visit: Payer: Self-pay

## 2024-02-11 DIAGNOSIS — E785 Hyperlipidemia, unspecified: Secondary | ICD-10-CM | POA: Diagnosis not present

## 2024-02-11 DIAGNOSIS — C801 Malignant (primary) neoplasm, unspecified: Secondary | ICD-10-CM | POA: Diagnosis not present

## 2024-02-11 DIAGNOSIS — M51369 Other intervertebral disc degeneration, lumbar region without mention of lumbar back pain or lower extremity pain: Secondary | ICD-10-CM | POA: Diagnosis not present

## 2024-02-11 DIAGNOSIS — Z87440 Personal history of urinary (tract) infections: Secondary | ICD-10-CM | POA: Diagnosis not present

## 2024-02-11 DIAGNOSIS — I110 Hypertensive heart disease with heart failure: Secondary | ICD-10-CM | POA: Diagnosis not present

## 2024-02-11 DIAGNOSIS — Z7902 Long term (current) use of antithrombotics/antiplatelets: Secondary | ICD-10-CM | POA: Diagnosis not present

## 2024-02-11 DIAGNOSIS — I5032 Chronic diastolic (congestive) heart failure: Secondary | ICD-10-CM | POA: Diagnosis not present

## 2024-02-11 DIAGNOSIS — Z85820 Personal history of malignant melanoma of skin: Secondary | ICD-10-CM | POA: Diagnosis not present

## 2024-02-11 DIAGNOSIS — Z981 Arthrodesis status: Secondary | ICD-10-CM | POA: Diagnosis not present

## 2024-02-11 DIAGNOSIS — Z96653 Presence of artificial knee joint, bilateral: Secondary | ICD-10-CM | POA: Diagnosis not present

## 2024-02-11 DIAGNOSIS — I2510 Atherosclerotic heart disease of native coronary artery without angina pectoris: Secondary | ICD-10-CM | POA: Diagnosis not present

## 2024-02-11 DIAGNOSIS — J441 Chronic obstructive pulmonary disease with (acute) exacerbation: Secondary | ICD-10-CM | POA: Diagnosis not present

## 2024-02-11 DIAGNOSIS — I69354 Hemiplegia and hemiparesis following cerebral infarction affecting left non-dominant side: Secondary | ICD-10-CM | POA: Diagnosis not present

## 2024-02-11 DIAGNOSIS — E119 Type 2 diabetes mellitus without complications: Secondary | ICD-10-CM | POA: Diagnosis not present

## 2024-02-11 DIAGNOSIS — M47816 Spondylosis without myelopathy or radiculopathy, lumbar region: Secondary | ICD-10-CM | POA: Diagnosis not present

## 2024-02-11 DIAGNOSIS — Z7984 Long term (current) use of oral hypoglycemic drugs: Secondary | ICD-10-CM | POA: Diagnosis not present

## 2024-02-11 DIAGNOSIS — H9190 Unspecified hearing loss, unspecified ear: Secondary | ICD-10-CM | POA: Diagnosis not present

## 2024-02-11 LAB — SURGICAL PATHOLOGY

## 2024-02-12 ENCOUNTER — Ambulatory Visit: Payer: Self-pay

## 2024-02-12 ENCOUNTER — Telehealth: Payer: Self-pay

## 2024-02-12 NOTE — Telephone Encounter (Signed)
 Pam called in to confirm [name]'s re-scheduled appointments.

## 2024-02-13 ENCOUNTER — Other Ambulatory Visit: Payer: Self-pay

## 2024-02-13 ENCOUNTER — Inpatient Hospital Stay

## 2024-02-13 ENCOUNTER — Other Ambulatory Visit

## 2024-02-17 DIAGNOSIS — Z87440 Personal history of urinary (tract) infections: Secondary | ICD-10-CM | POA: Diagnosis not present

## 2024-02-17 DIAGNOSIS — M47816 Spondylosis without myelopathy or radiculopathy, lumbar region: Secondary | ICD-10-CM | POA: Diagnosis not present

## 2024-02-17 DIAGNOSIS — E785 Hyperlipidemia, unspecified: Secondary | ICD-10-CM | POA: Diagnosis not present

## 2024-02-17 DIAGNOSIS — Z7902 Long term (current) use of antithrombotics/antiplatelets: Secondary | ICD-10-CM | POA: Diagnosis not present

## 2024-02-17 DIAGNOSIS — Z96653 Presence of artificial knee joint, bilateral: Secondary | ICD-10-CM | POA: Diagnosis not present

## 2024-02-17 DIAGNOSIS — E119 Type 2 diabetes mellitus without complications: Secondary | ICD-10-CM | POA: Diagnosis not present

## 2024-02-17 DIAGNOSIS — I69354 Hemiplegia and hemiparesis following cerebral infarction affecting left non-dominant side: Secondary | ICD-10-CM | POA: Diagnosis not present

## 2024-02-17 DIAGNOSIS — H9190 Unspecified hearing loss, unspecified ear: Secondary | ICD-10-CM | POA: Diagnosis not present

## 2024-02-17 DIAGNOSIS — C801 Malignant (primary) neoplasm, unspecified: Secondary | ICD-10-CM | POA: Diagnosis not present

## 2024-02-17 DIAGNOSIS — J441 Chronic obstructive pulmonary disease with (acute) exacerbation: Secondary | ICD-10-CM | POA: Diagnosis not present

## 2024-02-17 DIAGNOSIS — I5032 Chronic diastolic (congestive) heart failure: Secondary | ICD-10-CM | POA: Diagnosis not present

## 2024-02-17 DIAGNOSIS — I110 Hypertensive heart disease with heart failure: Secondary | ICD-10-CM | POA: Diagnosis not present

## 2024-02-17 DIAGNOSIS — I2510 Atherosclerotic heart disease of native coronary artery without angina pectoris: Secondary | ICD-10-CM | POA: Diagnosis not present

## 2024-02-17 DIAGNOSIS — M51369 Other intervertebral disc degeneration, lumbar region without mention of lumbar back pain or lower extremity pain: Secondary | ICD-10-CM | POA: Diagnosis not present

## 2024-02-17 DIAGNOSIS — Z85820 Personal history of malignant melanoma of skin: Secondary | ICD-10-CM | POA: Diagnosis not present

## 2024-02-17 DIAGNOSIS — Z7984 Long term (current) use of oral hypoglycemic drugs: Secondary | ICD-10-CM | POA: Diagnosis not present

## 2024-02-17 DIAGNOSIS — Z981 Arthrodesis status: Secondary | ICD-10-CM | POA: Diagnosis not present

## 2024-02-18 DIAGNOSIS — C4359 Malignant melanoma of other part of trunk: Secondary | ICD-10-CM | POA: Diagnosis not present

## 2024-02-19 ENCOUNTER — Encounter (HOSPITAL_COMMUNITY)

## 2024-02-19 DIAGNOSIS — Z79899 Other long term (current) drug therapy: Secondary | ICD-10-CM | POA: Diagnosis not present

## 2024-02-19 DIAGNOSIS — I5032 Chronic diastolic (congestive) heart failure: Secondary | ICD-10-CM | POA: Diagnosis not present

## 2024-02-19 DIAGNOSIS — J441 Chronic obstructive pulmonary disease with (acute) exacerbation: Secondary | ICD-10-CM | POA: Diagnosis not present

## 2024-02-19 DIAGNOSIS — J9811 Atelectasis: Secondary | ICD-10-CM | POA: Diagnosis not present

## 2024-02-19 DIAGNOSIS — I110 Hypertensive heart disease with heart failure: Secondary | ICD-10-CM | POA: Diagnosis not present

## 2024-02-19 DIAGNOSIS — R0989 Other specified symptoms and signs involving the circulatory and respiratory systems: Secondary | ICD-10-CM | POA: Diagnosis not present

## 2024-02-19 DIAGNOSIS — I2510 Atherosclerotic heart disease of native coronary artery without angina pectoris: Secondary | ICD-10-CM | POA: Diagnosis not present

## 2024-02-19 DIAGNOSIS — J90 Pleural effusion, not elsewhere classified: Secondary | ICD-10-CM | POA: Diagnosis not present

## 2024-02-19 DIAGNOSIS — R0602 Shortness of breath: Secondary | ICD-10-CM | POA: Diagnosis not present

## 2024-02-19 DIAGNOSIS — Z7984 Long term (current) use of oral hypoglycemic drugs: Secondary | ICD-10-CM | POA: Diagnosis not present

## 2024-02-19 DIAGNOSIS — E119 Type 2 diabetes mellitus without complications: Secondary | ICD-10-CM | POA: Diagnosis not present

## 2024-02-19 DIAGNOSIS — Z881 Allergy status to other antibiotic agents status: Secondary | ICD-10-CM | POA: Diagnosis not present

## 2024-02-19 NOTE — ED Provider Notes (Signed)
 ------------------------------------------------------------------------------- Attestation signed by Cherie Ardeen Hanger, MD at 02/22/24 1752 I was the attending physician on duty, and was available in Emergency Department for any consultations. I did not personally care for this patient. The patient was managed by the mid-level provider. I am co-signing the chart as the attending physician.   Signed by Ardeen SHAUNNA Cherie, MD February 22, 2024 at 5:52 PM  -------------------------------------------------------------------------------                                                                                     Emergency Department Provider Note    ED Clinical Impression   Final diagnoses:  COPD exacerbation    (CMS-HCC) (Primary)    ED Assessment/Plan    Condition: Stable Disposition: Discharge  This chart has been completed using Dragon Medical Dictation software, and while attempts have been made to ensure accuracy, certain words and phrases may not be transcribed as intended.   History   Chief Complaint  Patient presents with  . Shortness of Breath  . Weakness   HPI  [name] is a 83 y.o. female with history of COPD, HTN, CAD, history of CVA, metastatic carcinoma, HFpEF, DM2 presents to the emergency department for evaluation of shortness of breath.  Patient is accompanied by daughter who acts as primary historian.  Patient noted to have continued shortness of breath since previous visit to the emergency department about x 2 weeks ago.  She does have a cough, has history of COPD.  Patient has had decreased energy over the last week.  Patient according to her daughters have been using the restroom normally, denies abdominal pain or vomiting.  No fevers at home, no sick contacts.  Patient complaining of some chest tightness as well.    Allergies: is allergic to tizanidine and ciprofloxacin. Medications: has a current medication list which includes the  following long-term medication(s): albuterol, clopidogrel , ipratropium-albuterol, losartan, oxybutynin, pravastatin, and torsemide. PMHx:  has a past medical history of Cancer    (CMS-HCC), COPD (chronic obstructive pulmonary disease)    (CMS-HCC), Diabetes    (CMS-HCC), HTN (hypertension), and Stroke    (CMS-HCC). PSHx:  has a past surgical history that includes LEFT KNEE REPLACEMENT ; LEFT KNEE ARTHROSCOPY; RIGHT KNEE ARTHROSCOPY; ACDF; Knee surgery; and Spine surgery. SocHx:  reports that she has never smoked. She has never used smokeless tobacco. She reports that she does not drink alcohol and does not use drugs. Allergies, Medications, Medical, Surgical, and Social History were reviewed as documented above.   Social Drivers of Health with Concerns   Physical Activity: Inactive (05/24/2022)   Exercise Vital Sign   . Days of Exercise per Week: 0 days   . Minutes of Exercise per Session: 10 min  Substance Use: Not on file (05/25/2023)  Health Literacy: Medium Risk (05/24/2022)   Health Literacy   . : Sometimes     Review Of Systems  Review of Systems  Physical Exam   BP 116/57   Pulse 66   Temp 36.4 C (97.6 F) (Temporal)   Resp 12   Ht 157.5 cm (5' 2)   Wt 94.3 kg (208 lb)   LMP  (  LMP Unknown)   SpO2 96%   BMI 38.04 kg/m   Physical Exam Vitals reviewed.  Constitutional:      General: She is not in acute distress.    Appearance: Normal appearance. She is obese. She is not ill-appearing or toxic-appearing.  HENT:     Head: Normocephalic.     Mouth/Throat:     Mouth: Mucous membranes are moist.  Eyes:     Extraocular Movements: Extraocular movements intact.     Conjunctiva/sclera: Conjunctivae normal.  Cardiovascular:     Rate and Rhythm: Normal rate and regular rhythm.     Heart sounds: Normal heart sounds.  Pulmonary:     Effort: No respiratory distress.     Breath sounds: Wheezing present.     Comments: Coarse by breath sounds, left upper lobe and right  lower lobe expiratory wheeze, mild to moderate increased work of breathing without significant speech dyspnea, no retractions, no tachypnea Abdominal:     General: Abdomen is flat. There is no distension.     Palpations: Abdomen is soft.     Tenderness: There is no abdominal tenderness. There is no guarding.  Musculoskeletal:     Cervical back: Normal range of motion.     Right lower leg: No edema.     Left lower leg: No edema.  Neurological:     General: No focal deficit present.     Mental Status: She is alert and oriented to person, place, and time.  Psychiatric:        Mood and Affect: Mood normal.        Behavior: Behavior normal.     ED Course  Medical Decision Making Ddx: Shortness of breath, asthma/COPD exacerbation, CHF, arrhythmia, ischemia, ACS/PE; viral syndrome, lower respiratory tract infection  Afebrile, nontoxic-appearing 84 year-old female presents to the emergency department for evaluation of shortness of breath.  Initial vitals are reassuring, unremarkable. Physical exam notable for mild to moderate increased work of breathing, no retractions, no tachypnea, scattered areas of coarse breath sounds and expiratory wheeze noted. Patient is overall non-toxic, alert and oriented, GCS 15.  Will give DuoNeb and Solu-Medrol .  Labs are overall reassuring, interval improvement of hypoxemia from previous although still slightly below normal with history of COPD.  Mild alkalosis 7.47 without significant hypercapnia.  Hyperglycemia 226, patient is finishing steroid taper from recent admission.  Negative cardiac enzymes.  Chest x-ray without acute finding.  Low suspicion for ACS/PE.  Suspect is related to COPD exacerbation, after dose of Solu-Medrol  and x 1 breathing treatment, patient appears significantly improved.  Patient maintaining oxygen  saturations.  Will prescribe Augmentin, they do have plenty of breathing treatments at home according to family who is at bedside.  Patient given  return precautions.  Instructed close follow-up with PCP on Monday.  Discussed lab results and imaging findings with patient who is agreeable to discharge at this time.  Discussed at home symptomatic treatment as well as appropriate follow-up.  Discussed symptoms warranting return to the emergency department.  Patient advised return for new or worsening symptoms.  I have personally reviewed and interpreted the images ordered.    Amount and/or Complexity of Data Reviewed External Data Reviewed: labs, radiology and notes. Labs: ordered. Decision-making details documented in ED Course. Radiology: ordered and independent interpretation performed. Decision-making details documented in ED Course.     Procedures   Encounter Date: 02/19/24  ECG 12 Lead  Result Value   EKG Systolic BP    EKG Diastolic BP  EKG Ventricular Rate 71   EKG Atrial Rate 71   EKG P-R Interval 232   EKG QRS Duration 96   EKG Q-T Interval 374   EKG QTC Calculation 406   EKG Calculated P Axis 79   EKG Calculated R Axis 2   EKG Calculated T Axis 111   QTC Fredericia 395    ED Course as of 02/19/24 1620  Thu Feb 19, 2024  1338 Chest x-ray with low lung volumes, bibasilar atelectasis.  CBC shows no leukocytosis, H&H stable.  Hypoxemia 75, mild alkalosis 7.47, no significant hypercapnia.  1401 Hypokalemia possibly secondary to breathing treatments, hyperglycemia 226, no AKI, no transaminitis.  1408 Rapid viral testing negative.  1455 Urine specimen without acute findings.  1515 Patient appears significantly proved on subsequent evaluation.  Will await further cardiac evaluation.  1545 Cardiac BNP and troponin unremarkable.     ED Results Results for orders placed or performed during the hospital encounter of 02/19/24  Influenza/ RSV/COVID PCR   Specimen: Nasopharyngeal Swab  Result Value Ref Range   SARS-CoV-2 PCR Negative Negative   Influenza A Negative Negative   Influenza B Negative Negative   RSV  Negative Negative  Comprehensive Metabolic Panel  Result Value Ref Range   Sodium 140 135 - 145 mmol/L   Potassium 3.3 (L) 3.5 - 5.0 mmol/L   Chloride 101 98 - 107 mmol/L   CO2 30.9 21.0 - 32.0 mmol/L   Anion Gap 8 3 - 11 mmol/L   BUN 15 8 - 20 mg/dL   Creatinine 9.19 9.39 - 1.10 mg/dL   BUN/Creatinine Ratio 19    eGFR CKD-EPI (2021) Female 74 >=60 mL/min/1.24m2   Glucose 226 (H) 70 - 179 mg/dL   Calcium 8.5 8.5 - 89.8 mg/dL   Albumin 3.5 3.5 - 5.0 g/dL   Total Protein 6.6 6.0 - 8.0 g/dL   Total Bilirubin 0.4 0.3 - 1.2 mg/dL   AST 13 (L) 15 - 40 U/L   ALT 18 12 - 78 U/L   Alkaline Phosphatase 75 46 - 116 U/L  Magnesium  Result Value Ref Range   Magnesium 2.1 1.6 - 2.6 mg/dL  hsTroponin I (serial 9-7-3Y w/ delta)  Result Value Ref Range   hsTroponin I 17 <=34 ng/L  Pro-BNP  Result Value Ref Range   PRO-BNP 223.0 0.0 - 450.0 pg/mL  ECG 12 Lead  Result Value Ref Range   EKG Systolic BP  mmHg   EKG Diastolic BP  mmHg   EKG Ventricular Rate 71 BPM   EKG Atrial Rate 71 BPM   EKG P-R Interval 232 ms   EKG QRS Duration 96 ms   EKG Q-T Interval 374 ms   EKG QTC Calculation 406 ms   EKG Calculated P Axis 79 degrees   EKG Calculated R Axis 2 degrees   EKG Calculated T Axis 111 degrees   QTC Fredericia 395 ms  Arterial Blood Gas  Result Value Ref Range   pH, Arterial 7.47 (H) 7.35 - 7.45   pCO2, Arterial 41.6 35.0 - 48.0 mm[Hg]   pO2, Arterial 75 (L) 83 - 108 mm[Hg]   HCO3 (Bicarbonate), Arterial 30.2 (H) 18.0 - 23.0 mmol/L   Base Excess, Arterial 5.9 (H) -2.0 - 3.0 mmol/L   O2 Sat, Arterial 96.3 95.0 - 98.0 %   Total Carbon Dioxide, Arterial 31 (H) 22 - 29 mmol/L   Carboxyhemoglobin 1.3 0.0 - 4.9 %   ABG Allen Test POS    BG Draw  Site Radial, right   CBC w/ Differential  Result Value Ref Range   WBC 6.5 4.0 - 10.5 10*9/L   RBC 4.25 3.80 - 5.10 10*12/L   HGB 11.7 11.5 - 15.0 g/dL   HCT 63.5 65.9 - 55.9 %   MCV 85.6 80.0 - 98.0 fL   MCH 27.5 27.0 - 34.0 pg   MCHC  32.1 32.0 - 36.0 g/dL   RDW 82.6 (H) 88.4 - 85.4 %   MPV 11.3 (H) 7.4 - 10.4 fL   Platelet 170 140 - 415 10*9/L   Neutrophils % 53.2 %   Lymphocytes % 26.6 %   Monocytes % 17.7 %   Eosinophils % 0.9 %   Basophils % 0.2 %   Absolute Neutrophils 3.5 1.8 - 7.8 10*9/L   Absolute Lymphocytes 1.7 0.7 - 4.5 10*9/L   Absolute Monocytes 1.2 (H) 0.1 - 1.0 10*9/L   Absolute Eosinophils 0.1 0.0 - 0.4 10*9/L   Absolute Basophils 0.0 0.0 - 0.2 10*9/L  Urinalysis with Microscopy with Culture Reflex  Result Value Ref Range   Color, UA Light Yellow    Clarity, UA Clear Clear   Specific Gravity, UA 1.020 1.010 - 1.025   pH, UA 6.5 5.0 - 8.0   Leukocyte Esterase, UA Negative Negative   Nitrite, UA Negative Negative   Protein, UA Negative Negative   Glucose, UA Negative Negative, Trace   Ketones, UA Negative Negative   Urobilinogen, UA <2.0 mg/dL <7.9 mg/dL   Bilirubin, UA Negative Negative   Blood, UA Negative Negative   RBC, UA 1 0 - 3 /HPF   WBC, UA 1 0 - 3 /HPF   Squam Epithel, UA 0 0 - 10 /HPF   Bacteria, UA None Seen None Seen /HPF   WBC Clumps None Seen None Seen /HPF   Hyphal Yeast None Seen None Seen /HPF   Yeast, UA None Seen None Seen /HPF   XR Chest Portable Result Date: 02/19/2024 Exam:  Portable Chest  History:  Short of breath  Technique:  Single frontal view.  Comparison:  02/07/2024  Findings:   Patient is rotated slightly rightward. Cardiac and mediastinal contours are unchanged. Lung volumes are low. Vascular crowding at the lung bases. No pneumothorax. Trace left effusion.    Low lung volumes with bibasilar atelectasis.  Signed (Electronic Signature): 02/19/2024 1:06 PM Signed By: Dorothyann Jointer, MD   Medications Administered:  Medications  ipratropium-albuterol (DUO-NEB) 0.5-2.5 mg/3 mL nebulizer solution 3 mL (3 mL Nebulization Given 02/19/24 1304)  methylPREDNISolone  sodium succinate (SOLU-Medrol ) injection 40 mg (40 mg Intravenous Given 02/19/24 1308)    Discharge  Medications (Medications Prescribed during this  ED visit and Patient's Home Medications) :    Your Medication List     START taking these medications    amoxicillin-clavulanate 875-125 mg per tablet Commonly known as: AUGMENTIN Take 1 tablet by mouth two (2) times a day for 10 days.       ASK your doctor about these medications    acetaminophen  650 MG CR tablet Commonly known as: TYLENOL  8 HOUR Take 1 tablet (650 mg total) by mouth every eight (8) hours as needed.   albuterol 90 mcg/actuation inhaler Commonly known as: PROVENTIL HFA;VENTOLIN HFA Inhale 1 puff.   amoxicillin-clavulanate 875-125 mg per tablet Commonly known as: AUGMENTIN Take 1 tablet by mouth two (2) times a day for 5 days. Ask about: Should I take this medication?   ascorbic acid  500 MG tablet Generic drug: ascorbic acid  (vitamin  C) Take 1 tablet (500 mg total) by mouth in the morning.   clopidogrel  75 mg tablet Commonly known as: PLAVIX  Take 1 tablet (75 mg total) by mouth in the morning.   Fish Oil 1,200 (144-216) mg Cap Generic drug: omega 3-dha-epa-fish oil Take 1,200 mg by mouth in the morning.   guaiFENesin 600 mg 12 hr tablet Commonly known as: MUCINEX Take 2 tablets (1,200 mg total) by mouth two (2) times a day.   ipratropium-albuterol 0.5-2.5 mg/3 mL nebulizer Commonly known as: DUO-NEB Inhale 3 mL by nebulization every six (6) hours as needed.   losartan 25 MG tablet Commonly known as: COZAAR TAKE 1 TABLET (25 MG TOTAL) BY MOUTH DAILY.   metFORMIN 500 MG tablet Commonly known as: GLUCOPHAGE Take 1 tablet (500 mg total) by mouth in the morning and 1 tablet (500 mg total) in the evening. Take with meals.   nitroglycerin 0.4 MG SL tablet Commonly known as: NITROSTAT Place 1 tablet (0.4 mg total) under the tongue every five (5) minutes as needed for chest pain. Maximum of 3 doses in 15 minutes.   omega-3 acid ethyl esters 1 gram capsule Commonly known as: LOVAZA  Take 2 capsules (2  g total) by mouth.   oxybutynin 5 MG 24 hr tablet Commonly known as: DITROPAN-XL Take 1 tablet (5 mg total) by mouth daily.   pravastatin 40 MG tablet Commonly known as: PRAVACHOL Take 1 tablet (40 mg total) by mouth in the morning.   predniSONE  20 MG tablet Commonly known as: DELTASONE  Take 2 tablets (40 mg total) by mouth daily for 3 days, THEN 1 tablet (20 mg total) daily for 3 days, THEN 0.5 tablets (10 mg total) daily for 4 days. Start taking on: February 10, 2024   sertraline 50 MG tablet Commonly known as: ZOLOFT Take 1 tablet (50 mg total) by mouth daily.   torsemide 20 MG tablet Commonly known as: DEMADEX Take 1 tablet (20 mg total) by mouth in the morning.   vitamin B-12 500 MCG tablet Generic drug: cyanocobalamin  Take 1 tablet (500 mcg total) by mouth in the morning.   VITAMIN D3 25 mcg (1,000 unit) capsule Generic drug: cholecalciferol (vitamin D3-25 mcg (1,000 unit)) Take 1 capsule (25 mcg total) by mouth in the morning.          Kopel, Andrew Lee, [place] 02/19/24 1620

## 2024-02-23 NOTE — Progress Notes (Signed)
 Pharmacist Chemotherapy Monitoring - Initial Assessment    Anticipated start date: 03/02/24   The following has been reviewed per standard work regarding the patient's treatment regimen: The patient's diagnosis, treatment plan and drug doses, and organ/hematologic function Lab orders and baseline tests specific to treatment regimen  The treatment plan start date, drug sequencing, and pre-medications Prior authorization status  Patient's documented medication list, including drug-drug interaction screen and prescriptions for anti-emetics and supportive care specific to the treatment regimen The drug concentrations, fluid compatibility, administration routes, and timing of the medications to be used The patient's access for treatment and lifetime cumulative dose history, if applicable  The patient's medication allergies and previous infusion related reactions, if applicable   Changes made to treatment plan:  N/A  Follow up needed:  Port placement   Candy Glatter, PharmD, MBA

## 2024-02-24 ENCOUNTER — Other Ambulatory Visit: Payer: Self-pay

## 2024-02-25 DIAGNOSIS — E785 Hyperlipidemia, unspecified: Secondary | ICD-10-CM | POA: Diagnosis not present

## 2024-02-25 DIAGNOSIS — M51369 Other intervertebral disc degeneration, lumbar region without mention of lumbar back pain or lower extremity pain: Secondary | ICD-10-CM | POA: Diagnosis not present

## 2024-02-25 DIAGNOSIS — I110 Hypertensive heart disease with heart failure: Secondary | ICD-10-CM | POA: Diagnosis not present

## 2024-02-25 DIAGNOSIS — M47816 Spondylosis without myelopathy or radiculopathy, lumbar region: Secondary | ICD-10-CM | POA: Diagnosis not present

## 2024-02-25 DIAGNOSIS — C801 Malignant (primary) neoplasm, unspecified: Secondary | ICD-10-CM | POA: Diagnosis not present

## 2024-02-25 DIAGNOSIS — I2510 Atherosclerotic heart disease of native coronary artery without angina pectoris: Secondary | ICD-10-CM | POA: Diagnosis not present

## 2024-02-25 DIAGNOSIS — I5032 Chronic diastolic (congestive) heart failure: Secondary | ICD-10-CM | POA: Diagnosis not present

## 2024-02-25 DIAGNOSIS — I69354 Hemiplegia and hemiparesis following cerebral infarction affecting left non-dominant side: Secondary | ICD-10-CM | POA: Diagnosis not present

## 2024-02-25 DIAGNOSIS — Z7984 Long term (current) use of oral hypoglycemic drugs: Secondary | ICD-10-CM | POA: Diagnosis not present

## 2024-02-25 DIAGNOSIS — Z87440 Personal history of urinary (tract) infections: Secondary | ICD-10-CM | POA: Diagnosis not present

## 2024-02-25 DIAGNOSIS — Z85820 Personal history of malignant melanoma of skin: Secondary | ICD-10-CM | POA: Diagnosis not present

## 2024-02-25 DIAGNOSIS — H9190 Unspecified hearing loss, unspecified ear: Secondary | ICD-10-CM | POA: Diagnosis not present

## 2024-02-25 DIAGNOSIS — Z96653 Presence of artificial knee joint, bilateral: Secondary | ICD-10-CM | POA: Diagnosis not present

## 2024-02-25 DIAGNOSIS — J441 Chronic obstructive pulmonary disease with (acute) exacerbation: Secondary | ICD-10-CM | POA: Diagnosis not present

## 2024-02-25 DIAGNOSIS — Z981 Arthrodesis status: Secondary | ICD-10-CM | POA: Diagnosis not present

## 2024-02-25 DIAGNOSIS — E119 Type 2 diabetes mellitus without complications: Secondary | ICD-10-CM | POA: Diagnosis not present

## 2024-02-25 DIAGNOSIS — Z7902 Long term (current) use of antithrombotics/antiplatelets: Secondary | ICD-10-CM | POA: Diagnosis not present

## 2024-02-26 DIAGNOSIS — C801 Malignant (primary) neoplasm, unspecified: Secondary | ICD-10-CM | POA: Diagnosis not present

## 2024-02-26 DIAGNOSIS — I5032 Chronic diastolic (congestive) heart failure: Secondary | ICD-10-CM | POA: Diagnosis not present

## 2024-02-26 DIAGNOSIS — I110 Hypertensive heart disease with heart failure: Secondary | ICD-10-CM | POA: Diagnosis not present

## 2024-02-26 DIAGNOSIS — I69354 Hemiplegia and hemiparesis following cerebral infarction affecting left non-dominant side: Secondary | ICD-10-CM | POA: Diagnosis not present

## 2024-02-26 DIAGNOSIS — Z85820 Personal history of malignant melanoma of skin: Secondary | ICD-10-CM | POA: Diagnosis not present

## 2024-02-26 DIAGNOSIS — I2510 Atherosclerotic heart disease of native coronary artery without angina pectoris: Secondary | ICD-10-CM | POA: Diagnosis not present

## 2024-02-26 DIAGNOSIS — J441 Chronic obstructive pulmonary disease with (acute) exacerbation: Secondary | ICD-10-CM | POA: Diagnosis not present

## 2024-02-26 DIAGNOSIS — M51369 Other intervertebral disc degeneration, lumbar region without mention of lumbar back pain or lower extremity pain: Secondary | ICD-10-CM | POA: Diagnosis not present

## 2024-02-26 DIAGNOSIS — Z7902 Long term (current) use of antithrombotics/antiplatelets: Secondary | ICD-10-CM | POA: Diagnosis not present

## 2024-02-26 DIAGNOSIS — Z96653 Presence of artificial knee joint, bilateral: Secondary | ICD-10-CM | POA: Diagnosis not present

## 2024-02-26 DIAGNOSIS — Z981 Arthrodesis status: Secondary | ICD-10-CM | POA: Diagnosis not present

## 2024-02-26 DIAGNOSIS — H9190 Unspecified hearing loss, unspecified ear: Secondary | ICD-10-CM | POA: Diagnosis not present

## 2024-02-26 DIAGNOSIS — Z7984 Long term (current) use of oral hypoglycemic drugs: Secondary | ICD-10-CM | POA: Diagnosis not present

## 2024-02-26 DIAGNOSIS — M47816 Spondylosis without myelopathy or radiculopathy, lumbar region: Secondary | ICD-10-CM | POA: Diagnosis not present

## 2024-02-26 DIAGNOSIS — E785 Hyperlipidemia, unspecified: Secondary | ICD-10-CM | POA: Diagnosis not present

## 2024-02-26 DIAGNOSIS — Z87440 Personal history of urinary (tract) infections: Secondary | ICD-10-CM | POA: Diagnosis not present

## 2024-02-26 DIAGNOSIS — E119 Type 2 diabetes mellitus without complications: Secondary | ICD-10-CM | POA: Diagnosis not present

## 2024-02-27 ENCOUNTER — Ambulatory Visit (HOSPITAL_COMMUNITY)
Admission: RE | Admit: 2024-02-27 | Discharge: 2024-02-27 | Disposition: A | Discharge status: Home / Self Care | Source: Ambulatory Visit

## 2024-02-27 DIAGNOSIS — C439 Malignant melanoma of skin, unspecified: Secondary | ICD-10-CM | POA: Diagnosis not present

## 2024-02-27 DIAGNOSIS — R591 Generalized enlarged lymph nodes: Secondary | ICD-10-CM | POA: Diagnosis not present

## 2024-02-27 LAB — GLUCOSE, CAPILLARY: Glucose-Capillary: 123 mg/dL — ABNORMAL HIGH (ref 70–99)

## 2024-02-27 MED ORDER — FLUDEOXYGLUCOSE F - 18 (FDG) INJECTION
10.2500 | Freq: Once | INTRAVENOUS | Status: AC
  Administered 2024-02-27: 10.21 via INTRAVENOUS

## 2024-03-01 NOTE — Assessment & Plan Note (Signed)
 Biopsy of left axillary lymph node confirmed melanoma. Will request BRAF testing MRI of the brain Start immunotherapy with pembrolizumab  Restaging scan after 4 cycles

## 2024-03-01 NOTE — Assessment & Plan Note (Signed)
 TMB high.  Positive for ATM, PI3CA Start immunotherapy with pembrolizumab  Restaging scan after 4 cycles

## 2024-03-01 NOTE — Progress Notes (Unsigned)
 [facility] OFFICE PROGRESS NOTE  Patient Care Team: Orpha Yancey LABOR, MD as PCP - General (Unknown Physician Specialty) Charls Pearla LABOR, MD (Inactive) as Attending Physician (Cardiology) Gaither Anes, MD as Attending Physician (Neurosurgery)  Assessment & Plan   No orders of the defined types were placed in this encounter.    Pauletta JAYSON Chihuahua, MD  INTERVAL HISTORY: Patient returns for follow-up.  Oncology History  Melanoma of skin (HCC)  01/22/2024 Initial Diagnosis   Melanoma of skin (HCC)   02/13/2024 -  Chemotherapy   Patient is on Treatment Plan : MELANOMA Pembrolizumab  (200) q21d        PHYSICAL EXAMINATION: ECOG PERFORMANCE STATUS: {CHL ONC ECOG PS:[id number]}  There were no vitals filed for this visit. There were no vitals filed for this visit.  Relevant data reviewed during this visit included ***

## 2024-03-02 ENCOUNTER — Inpatient Hospital Stay (HOSPITAL_BASED_OUTPATIENT_CLINIC_OR_DEPARTMENT_OTHER)

## 2024-03-02 ENCOUNTER — Inpatient Hospital Stay

## 2024-03-02 VITALS — BP 124/86 | HR 99 | Temp 97.9°F | Resp 16 | Ht 65.5 in

## 2024-03-02 DIAGNOSIS — C4492 Squamous cell carcinoma of skin, unspecified: Secondary | ICD-10-CM

## 2024-03-02 DIAGNOSIS — C773 Secondary and unspecified malignant neoplasm of axilla and upper limb lymph nodes: Secondary | ICD-10-CM

## 2024-03-02 DIAGNOSIS — C439 Malignant melanoma of skin, unspecified: Secondary | ICD-10-CM

## 2024-03-02 DIAGNOSIS — Z79620 Long term (current) use of immunosuppressive biologic: Secondary | ICD-10-CM | POA: Diagnosis not present

## 2024-03-02 DIAGNOSIS — Z5112 Encounter for antineoplastic immunotherapy: Secondary | ICD-10-CM | POA: Diagnosis not present

## 2024-03-02 DIAGNOSIS — Z1509 Genetic susceptibility to other malignant neoplasm: Secondary | ICD-10-CM

## 2024-03-02 DIAGNOSIS — Z1501 Genetic susceptibility to malignant neoplasm of breast: Secondary | ICD-10-CM | POA: Insufficient documentation

## 2024-03-02 DIAGNOSIS — C778 Secondary and unspecified malignant neoplasm of lymph nodes of multiple regions: Secondary | ICD-10-CM | POA: Diagnosis not present

## 2024-03-02 LAB — CMP (CANCER CENTER ONLY)
ALT: 12 U/L (ref 0–44)
AST: 17 U/L (ref 15–41)
Albumin: 4.3 g/dL (ref 3.5–5.0)
Alkaline Phosphatase: 74 U/L (ref 38–126)
Anion gap: 10 (ref 5–15)
BUN: 14 mg/dL (ref 8–23)
CO2: 33 mmol/L — ABNORMAL HIGH (ref 22–32)
Calcium: 9.2 mg/dL (ref 8.9–10.3)
Chloride: 98 mmol/L (ref 98–111)
Creatinine: 0.77 mg/dL (ref 0.44–1.00)
GFR, Estimated: >60 mL/min (ref >60)
Glucose, Bld: 130 mg/dL — ABNORMAL HIGH (ref 70–99)
Potassium: 3.1 mmol/L — ABNORMAL LOW (ref 3.5–5.1)
Sodium: 141 mmol/L (ref 135–145)
Total Bilirubin: 0.4 mg/dL (ref 0.0–1.2)
Total Protein: 7.1 g/dL (ref 6.5–8.1)

## 2024-03-02 LAB — CBC WITH DIFFERENTIAL (CANCER CENTER ONLY)
Abs Immature Granulocytes: 0.04 K/uL (ref 0.00–0.07)
Basophils Absolute: 0 K/uL (ref 0.0–0.1)
Basophils Relative: 0 %
Eosinophils Absolute: 0 K/uL (ref 0.0–0.5)
Eosinophils Relative: 1 %
HCT: 38.3 % (ref 36.0–46.0)
Hemoglobin: 12.5 g/dL (ref 12.0–15.0)
Immature Granulocytes: 1 %
Lymphocytes Relative: 27 %
Lymphs Abs: 1.7 K/uL (ref 0.7–4.0)
MCH: 27.2 pg (ref 26.0–34.0)
MCHC: 32.6 g/dL (ref 30.0–36.0)
MCV: 83.4 fL (ref 80.0–100.0)
Monocytes Absolute: 1.2 K/uL — ABNORMAL HIGH (ref 0.1–1.0)
Monocytes Relative: 19 %
Neutro Abs: 3.3 K/uL (ref 1.7–7.7)
Neutrophils Relative %: 52 %
Platelet Count: 170 K/uL (ref 150–400)
RBC: 4.59 MIL/uL (ref 3.87–5.11)
RDW: 17.7 % — ABNORMAL HIGH (ref 11.5–15.5)
WBC Count: 6.4 K/uL (ref 4.0–10.5)
nRBC: 0 % (ref 0.0–0.2)

## 2024-03-02 LAB — TSH: TSH: 2.18 u[IU]/mL (ref 0.350–4.500)

## 2024-03-02 LAB — T4, FREE: Free T4: 0.98 ng/dL (ref 0.61–1.12)

## 2024-03-02 MED ORDER — SODIUM CHLORIDE 0.9 % IV SOLN
200.0000 mg | Freq: Once | INTRAVENOUS | Status: AC
  Administered 2024-03-02: 200 mg via INTRAVENOUS
  Filled 2024-03-02: qty 200

## 2024-03-02 MED ORDER — SODIUM CHLORIDE 0.9 % IV SOLN: INTRAVENOUS | Status: DC

## 2024-03-02 NOTE — Assessment & Plan Note (Addendum)
 unclear if the mutation is only somatic.   Will discuss in next visit refer to genetic counselor for testing.

## 2024-03-02 NOTE — Patient Instructions (Signed)
 [facility] - A DEPT OF [facility]  Discharge Instructions: Thank you for choosing [facility] to provide your oncology and hematology care.   If you have a lab appointment with the Cancer Center, please go directly to the Cancer Center and check in at the registration area.   Wear comfortable clothing and clothing appropriate for easy access to any Portacath or PICC line.   We strive to give you quality time with your provider. You may need to reschedule your appointment if you arrive late (15 or more minutes).  Arriving late affects you and other patients whose appointments are after yours.  Also, if you miss three or more appointments without notifying the office, you may be dismissed from the clinic at the provider's discretion.      For prescription refill requests, have your pharmacy contact our office and allow 72 hours for refills to be completed.    Today you received the following chemotherapy and/or immunotherapy agents: pembrolizumab  (KEYTRUDA )     To help prevent nausea and vomiting after your treatment, we encourage you to take your nausea medication as directed.  BELOW ARE SYMPTOMS THAT SHOULD BE REPORTED IMMEDIATELY: *FEVER GREATER THAN 100.4 F (38 C) OR HIGHER *CHILLS OR SWEATING *NAUSEA AND VOMITING THAT IS NOT CONTROLLED WITH YOUR NAUSEA MEDICATION *UNUSUAL SHORTNESS OF BREATH *UNUSUAL BRUISING OR BLEEDING *URINARY PROBLEMS (pain or burning when urinating, or frequent urination) *BOWEL PROBLEMS (unusual diarrhea, constipation, pain near the anus) TENDERNESS IN MOUTH AND THROAT WITH OR WITHOUT PRESENCE OF ULCERS (sore throat, sores in mouth, or a toothache) UNUSUAL RASH, SWELLING OR PAIN  UNUSUAL VAGINAL DISCHARGE OR ITCHING   Items with * indicate a potential emergency and should be followed up as soon as possible or go to the Emergency Department if any problems should occur.  Please show the CHEMOTHERAPY ALERT CARD or  IMMUNOTHERAPY ALERT CARD at check-in to the Emergency Department and triage nurse.  Should you have questions after your visit or need to cancel or reschedule your appointment, please contact [facility] - A DEPT OF [facility]  Dept: [phone]  and follow the prompts.  Office hours are 8:00 a.m. to 4:30 p.m. Monday - Friday. Please note that voicemails left after 4:00 p.m. may not be returned until the following business day.  We are closed weekends and major holidays. You have access to a nurse at all times for urgent questions. Please call the main number to the clinic Dept: [phone] and follow the prompts.   For any non-urgent questions, you may also contact your provider using MyChart. We now offer e-Visits for anyone 52 and older to request care online for non-urgent symptoms. For details visit [email or web address].   Also download the MyChart app! Go to the app store, search MyChart, open the app, select [facility], and log in with your MyChart username and password.  Pembrolizumab  Injection What is this medication? PEMBROLIZUMAB  (PEM broe LIZ ue mab) treats some types of cancer. It works by helping your immune system slow or stop the spread of cancer cells. It is a monoclonal antibody. This medicine may be used for other purposes; ask your health care provider or pharmacist if you have questions. COMMON BRAND NAME(S): Keytruda  What should I tell my care team before I take this medication? They need to know if you have any of these conditions: Allogeneic stem cell transplant (uses someone else's stem cells)  Autoimmune diseases, such as Crohn disease, ulcerative colitis, lupus History of chest radiation Nervous system problems, such as Guillain-Barre syndrome, myasthenia gravis Organ transplant An unusual or allergic reaction to pembrolizumab , other medications, foods, dyes, or preservatives Pregnant or trying to get  pregnant Breast-feeding How should I use this medication? This medication is injected into a vein. It is given by your care team in a hospital or clinic setting. A special MedGuide will be given to you before each treatment. Be sure to read this information carefully each time. Talk to your care team about the use of this medication in children. While it may be prescribed for children as young as 6 months for selected conditions, precautions do apply. Overdosage: If you think you have taken too much of this medicine contact a poison control center or emergency room at once. NOTE: This medicine is only for you. Do not share this medicine with others. What if I miss a dose? Keep appointments for follow-up doses. It is important not to miss your dose. Call your care team if you are unable to keep an appointment. What may interact with this medication? Interactions have not been studied. This list may not describe all possible interactions. Give your health care provider a list of all the medicines, herbs, non-prescription drugs, or dietary supplements you use. Also tell them if you smoke, drink alcohol, or use illegal drugs. Some items may interact with your medicine. What should I watch for while using this medication? Your condition will be monitored carefully while you are receiving this medication. You may need blood work while taking this medication. This medication may cause serious skin reactions. They can happen weeks to months after starting the medication. Contact your care team right away if you notice fevers or flu-like symptoms with a rash. The rash may be red or purple and then turn into blisters or peeling of the skin. You may also notice a red rash with swelling of the face, lips, or lymph nodes in your neck or under your arms. Tell your care team right away if you have any change in your eyesight. Talk to your care team if you may be pregnant. Serious birth defects can occur if you  take this medication during pregnancy and for 4 months after the last dose. You will need a negative pregnancy test before starting this medication. Contraception is recommended while taking this medication and for 4 months after the last dose. Your care team can help you find the option that works for you. Do not breastfeed while taking this medication and for 4 months after the last dose. What side effects may I notice from receiving this medication? Side effects that you should report to your care team as soon as possible: Allergic reactions--skin rash, itching, hives, swelling of the face, lips, tongue, or throat Dry cough, shortness of breath or trouble breathing Eye pain, redness, irritation, or discharge with blurry or decreased vision Heart muscle inflammation--unusual weakness or fatigue, shortness of breath, chest pain, fast or irregular heartbeat, dizziness, swelling of the ankles, feet, or hands Hormone gland problems--headache, sensitivity to light, unusual weakness or fatigue, dizziness, fast or irregular heartbeat, increased sensitivity to cold or heat, excessive sweating, constipation, hair loss, increased thirst or amount of urine, tremors or shaking, irritability Infusion reactions--chest pain, shortness of breath or trouble breathing, feeling faint or lightheaded Kidney injury (glomerulonephritis)--decrease in the amount of urine, red or dark brown urine, foamy or bubbly urine, swelling of the ankles, hands, or feet  Liver injury--right upper belly pain, loss of appetite, nausea, light-colored stool, dark yellow or brown urine, yellowing skin or eyes, unusual weakness or fatigue Pain, tingling, or numbness in the hands or feet, muscle weakness, change in vision, confusion or trouble speaking, loss of balance or coordination, trouble walking, seizures Rash, fever, and swollen lymph nodes Redness, blistering, peeling, or loosening of the skin, including inside the mouth Sudden or  severe stomach pain, bloody diarrhea, fever, nausea, vomiting Side effects that usually do not require medical attention (report to your care team if they continue or are bothersome): Bone, joint, or muscle pain Diarrhea Fatigue Loss of appetite Nausea Skin rash This list may not describe all possible side effects. Call your doctor for medical advice about side effects. You may report side effects to FDA at 1-800-FDA-1088. Where should I keep my medication? This medication is given in a hospital or clinic. It will not be stored at home. NOTE: This sheet is a summary. It may not cover all possible information. If you have questions about this medicine, talk to your doctor, pharmacist, or health care provider.  2024 Elsevier/Gold Standard (2021-10-09 00:00:00)

## 2024-03-03 ENCOUNTER — Other Ambulatory Visit: Payer: Self-pay

## 2024-03-04 ENCOUNTER — Encounter (HOSPITAL_COMMUNITY): Payer: Self-pay

## 2024-03-04 ENCOUNTER — Other Ambulatory Visit: Payer: Self-pay

## 2024-03-04 ENCOUNTER — Telehealth: Payer: Self-pay

## 2024-03-04 NOTE — Telephone Encounter (Signed)
 LM for patient that this nurse was calling to see how they were doing after their treatment. Please call back to Dr. Fredrik nurse at [phone] if they have any questions or concerns regarding the treatment.

## 2024-03-04 NOTE — Telephone Encounter (Signed)
-----   Message from Nurse Milo KIDD sent at 03/02/2024  4:59 PM EDT ----- Regarding: First Time Keytruda  Dr. Kai patient. Tolerated Keytruda  well, no distress.

## 2024-03-07 DIAGNOSIS — R0902 Hypoxemia: Secondary | ICD-10-CM | POA: Diagnosis not present

## 2024-03-07 DIAGNOSIS — E876 Hypokalemia: Secondary | ICD-10-CM | POA: Diagnosis not present

## 2024-03-07 DIAGNOSIS — Z792 Long term (current) use of antibiotics: Secondary | ICD-10-CM | POA: Diagnosis not present

## 2024-03-07 DIAGNOSIS — E119 Type 2 diabetes mellitus without complications: Secondary | ICD-10-CM | POA: Diagnosis not present

## 2024-03-07 DIAGNOSIS — R0689 Other abnormalities of breathing: Secondary | ICD-10-CM | POA: Diagnosis not present

## 2024-03-07 DIAGNOSIS — J449 Chronic obstructive pulmonary disease, unspecified: Secondary | ICD-10-CM | POA: Diagnosis not present

## 2024-03-07 DIAGNOSIS — J9601 Acute respiratory failure with hypoxia: Secondary | ICD-10-CM | POA: Diagnosis not present

## 2024-03-07 DIAGNOSIS — R54 Age-related physical debility: Secondary | ICD-10-CM | POA: Diagnosis not present

## 2024-03-07 DIAGNOSIS — R7989 Other specified abnormal findings of blood chemistry: Secondary | ICD-10-CM | POA: Diagnosis not present

## 2024-03-07 DIAGNOSIS — R531 Weakness: Secondary | ICD-10-CM | POA: Diagnosis not present

## 2024-03-07 DIAGNOSIS — J440 Chronic obstructive pulmonary disease with acute lower respiratory infection: Secondary | ICD-10-CM | POA: Diagnosis not present

## 2024-03-07 DIAGNOSIS — I110 Hypertensive heart disease with heart failure: Secondary | ICD-10-CM | POA: Diagnosis not present

## 2024-03-07 DIAGNOSIS — I69354 Hemiplegia and hemiparesis following cerebral infarction affecting left non-dominant side: Secondary | ICD-10-CM | POA: Diagnosis not present

## 2024-03-07 DIAGNOSIS — R0989 Other specified symptoms and signs involving the circulatory and respiratory systems: Secondary | ICD-10-CM | POA: Diagnosis not present

## 2024-03-07 DIAGNOSIS — R599 Enlarged lymph nodes, unspecified: Secondary | ICD-10-CM | POA: Diagnosis not present

## 2024-03-07 DIAGNOSIS — Z8673 Personal history of transient ischemic attack (TIA), and cerebral infarction without residual deficits: Secondary | ICD-10-CM | POA: Diagnosis not present

## 2024-03-07 DIAGNOSIS — Z66 Do not resuscitate: Secondary | ICD-10-CM | POA: Diagnosis not present

## 2024-03-07 DIAGNOSIS — I77810 Thoracic aortic ectasia: Secondary | ICD-10-CM | POA: Diagnosis not present

## 2024-03-07 DIAGNOSIS — I272 Pulmonary hypertension, unspecified: Secondary | ICD-10-CM | POA: Diagnosis not present

## 2024-03-07 DIAGNOSIS — Z452 Encounter for adjustment and management of vascular access device: Secondary | ICD-10-CM | POA: Diagnosis not present

## 2024-03-07 DIAGNOSIS — R911 Solitary pulmonary nodule: Secondary | ICD-10-CM | POA: Diagnosis not present

## 2024-03-07 DIAGNOSIS — K7682 Hepatic encephalopathy: Secondary | ICD-10-CM | POA: Diagnosis not present

## 2024-03-07 DIAGNOSIS — E039 Hypothyroidism, unspecified: Secondary | ICD-10-CM | POA: Diagnosis not present

## 2024-03-07 DIAGNOSIS — E785 Hyperlipidemia, unspecified: Secondary | ICD-10-CM | POA: Diagnosis not present

## 2024-03-07 DIAGNOSIS — D649 Anemia, unspecified: Secondary | ICD-10-CM | POA: Diagnosis not present

## 2024-03-07 DIAGNOSIS — Z8249 Family history of ischemic heart disease and other diseases of the circulatory system: Secondary | ICD-10-CM | POA: Diagnosis not present

## 2024-03-07 DIAGNOSIS — R0602 Shortness of breath: Secondary | ICD-10-CM | POA: Diagnosis not present

## 2024-03-07 DIAGNOSIS — Z79899 Other long term (current) drug therapy: Secondary | ICD-10-CM | POA: Diagnosis not present

## 2024-03-07 DIAGNOSIS — Z96653 Presence of artificial knee joint, bilateral: Secondary | ICD-10-CM | POA: Diagnosis not present

## 2024-03-07 DIAGNOSIS — I351 Nonrheumatic aortic (valve) insufficiency: Secondary | ICD-10-CM | POA: Diagnosis not present

## 2024-03-07 DIAGNOSIS — E2740 Unspecified adrenocortical insufficiency: Secondary | ICD-10-CM | POA: Diagnosis not present

## 2024-03-07 DIAGNOSIS — R918 Other nonspecific abnormal finding of lung field: Secondary | ICD-10-CM | POA: Diagnosis not present

## 2024-03-07 DIAGNOSIS — I2510 Atherosclerotic heart disease of native coronary artery without angina pectoris: Secondary | ICD-10-CM | POA: Diagnosis not present

## 2024-03-07 DIAGNOSIS — R791 Abnormal coagulation profile: Secondary | ICD-10-CM | POA: Diagnosis not present

## 2024-03-07 DIAGNOSIS — I5032 Chronic diastolic (congestive) heart failure: Secondary | ICD-10-CM | POA: Diagnosis not present

## 2024-03-07 DIAGNOSIS — C7989 Secondary malignant neoplasm of other specified sites: Secondary | ICD-10-CM | POA: Diagnosis not present

## 2024-03-07 DIAGNOSIS — I499 Cardiac arrhythmia, unspecified: Secondary | ICD-10-CM | POA: Diagnosis not present

## 2024-03-07 DIAGNOSIS — Z7984 Long term (current) use of oral hypoglycemic drugs: Secondary | ICD-10-CM | POA: Diagnosis not present

## 2024-03-07 DIAGNOSIS — K8020 Calculus of gallbladder without cholecystitis without obstruction: Secondary | ICD-10-CM | POA: Diagnosis not present

## 2024-03-07 DIAGNOSIS — J441 Chronic obstructive pulmonary disease with (acute) exacerbation: Secondary | ICD-10-CM | POA: Diagnosis not present

## 2024-03-07 DIAGNOSIS — R591 Generalized enlarged lymph nodes: Secondary | ICD-10-CM | POA: Diagnosis not present

## 2024-03-07 DIAGNOSIS — C801 Malignant (primary) neoplasm, unspecified: Secondary | ICD-10-CM | POA: Diagnosis not present

## 2024-03-07 DIAGNOSIS — Z9981 Dependence on supplemental oxygen: Secondary | ICD-10-CM | POA: Diagnosis not present

## 2024-03-07 DIAGNOSIS — M7989 Other specified soft tissue disorders: Secondary | ICD-10-CM | POA: Diagnosis not present

## 2024-03-07 DIAGNOSIS — R0603 Acute respiratory distress: Secondary | ICD-10-CM | POA: Diagnosis not present

## 2024-03-07 DIAGNOSIS — R0600 Dyspnea, unspecified: Secondary | ICD-10-CM | POA: Diagnosis not present

## 2024-03-07 DIAGNOSIS — J90 Pleural effusion, not elsewhere classified: Secondary | ICD-10-CM | POA: Diagnosis not present

## 2024-03-07 DIAGNOSIS — C775 Secondary and unspecified malignant neoplasm of intrapelvic lymph nodes: Secondary | ICD-10-CM | POA: Diagnosis not present

## 2024-03-07 DIAGNOSIS — Z7902 Long term (current) use of antithrombotics/antiplatelets: Secondary | ICD-10-CM | POA: Diagnosis not present

## 2024-03-08 ENCOUNTER — Telehealth: Payer: Self-pay

## 2024-03-08 ENCOUNTER — Other Ambulatory Visit: Payer: Self-pay | Admitting: Radiology

## 2024-03-08 DIAGNOSIS — R0902 Hypoxemia: Secondary | ICD-10-CM | POA: Diagnosis not present

## 2024-03-08 NOTE — Telephone Encounter (Signed)
 Called to follow up due to report of ED presentation for short of breath. No answer.  Team. Please call daughter Holley to follow up later today and see how patient is doing. Thank you

## 2024-03-08 NOTE — Telephone Encounter (Signed)
 Spoke with daughter Holley states patient remains in the hospital for a possible PE. They are doing a cath now to check. States patient's 02 dropped to 51% at one time.  She will be unable to make her appointment to have her port placed tomorrow. Dr Tina made aware.Arland Legions BSN RN

## 2024-03-09 ENCOUNTER — Other Ambulatory Visit (HOSPITAL_COMMUNITY)

## 2024-03-09 ENCOUNTER — Ambulatory Visit (HOSPITAL_COMMUNITY)

## 2024-03-09 DIAGNOSIS — R7989 Other specified abnormal findings of blood chemistry: Secondary | ICD-10-CM | POA: Diagnosis not present

## 2024-03-09 DIAGNOSIS — I2510 Atherosclerotic heart disease of native coronary artery without angina pectoris: Secondary | ICD-10-CM | POA: Diagnosis not present

## 2024-03-09 DIAGNOSIS — J449 Chronic obstructive pulmonary disease, unspecified: Secondary | ICD-10-CM | POA: Diagnosis not present

## 2024-03-09 DIAGNOSIS — I5032 Chronic diastolic (congestive) heart failure: Secondary | ICD-10-CM | POA: Diagnosis not present

## 2024-03-09 DIAGNOSIS — C7989 Secondary malignant neoplasm of other specified sites: Secondary | ICD-10-CM | POA: Diagnosis not present

## 2024-03-09 DIAGNOSIS — E785 Hyperlipidemia, unspecified: Secondary | ICD-10-CM | POA: Diagnosis not present

## 2024-03-09 DIAGNOSIS — D649 Anemia, unspecified: Secondary | ICD-10-CM | POA: Diagnosis not present

## 2024-03-09 DIAGNOSIS — J9601 Acute respiratory failure with hypoxia: Secondary | ICD-10-CM | POA: Diagnosis not present

## 2024-03-09 DIAGNOSIS — E119 Type 2 diabetes mellitus without complications: Secondary | ICD-10-CM | POA: Diagnosis not present

## 2024-03-09 DIAGNOSIS — I69354 Hemiplegia and hemiparesis following cerebral infarction affecting left non-dominant side: Secondary | ICD-10-CM | POA: Diagnosis not present

## 2024-03-09 NOTE — Nursing Note (Addendum)
   03/09/24 1127  Rapid Rounds  Attendance Nursing;Physician;Advanced Practice Provider (APP);Case Manager;Dietitian/Nutrition Services;Occupational Therapy;Pharmacy;Physical Therapy;Respiratory Therapy  Expected Discharge Disposition HH Services  Today we still await: Clinical stability   Will have doppler of lower extremities today as well as iron  transfusion. Will also have overnight pulse oximetry study done. Will hopefully discharge tomorrow and resume HH with [facility].

## 2024-03-09 NOTE — Nursing Note (Signed)
 Pt  assessed  for  breathing  treatment  pt  resting  comfortably  no distress  noted  no prn  treatment  needed  at this time

## 2024-03-09 NOTE — Nursing Note (Signed)
 Referral for palliative sent to [facility].

## 2024-03-09 NOTE — Care Plan (Signed)
 Shift Summary iron  dextran infusion completed in the afternoon.    Vitals, oxygenation, and skin integrity were closely monitored and interventions were provided as needed.   Absence of Infection Signs and Symptoms: Temperature increased slightly but remained within a narrow range, and sepsis risk scores stayed low throughout the shift; doxycycline  was administered and rest/sleep was promoted at regular intervals.   Skin Health and Integrity: Skin integrity remained poor with bruising and abrasion noted, and frequent weight shifts and positioning supports were utilized to reduce pressure; adhesive use was limited.   Effective Oxygenation and Ventilation: Oxygen  saturation fluctuated, with a notable drop to 89% requiring re-initiation of nasal cannula at 2L/min, and diminished breath sounds with expiratory wheezes were documented; incentive spirometry and airway clearance techniques were encouraged and tolerated fairly well.   Optimal Gas Exchange: SpO2 values varied, with a brief period of hypoxemia managed by oxygen  therapy, and breath sounds remained diminished; O2 therapy was adjusted as needed.   Blood Pressure in Desired Range: Blood pressure remained relatively stable and within a similar range throughout the shift.

## 2024-03-10 DIAGNOSIS — I2510 Atherosclerotic heart disease of native coronary artery without angina pectoris: Secondary | ICD-10-CM | POA: Diagnosis not present

## 2024-03-10 DIAGNOSIS — E785 Hyperlipidemia, unspecified: Secondary | ICD-10-CM | POA: Diagnosis not present

## 2024-03-10 DIAGNOSIS — J449 Chronic obstructive pulmonary disease, unspecified: Secondary | ICD-10-CM | POA: Diagnosis not present

## 2024-03-10 DIAGNOSIS — D649 Anemia, unspecified: Secondary | ICD-10-CM | POA: Diagnosis not present

## 2024-03-10 DIAGNOSIS — E119 Type 2 diabetes mellitus without complications: Secondary | ICD-10-CM | POA: Diagnosis not present

## 2024-03-10 DIAGNOSIS — Z8673 Personal history of transient ischemic attack (TIA), and cerebral infarction without residual deficits: Secondary | ICD-10-CM | POA: Diagnosis not present

## 2024-03-10 DIAGNOSIS — C775 Secondary and unspecified malignant neoplasm of intrapelvic lymph nodes: Secondary | ICD-10-CM | POA: Diagnosis not present

## 2024-03-10 DIAGNOSIS — R531 Weakness: Secondary | ICD-10-CM | POA: Diagnosis not present

## 2024-03-10 DIAGNOSIS — R791 Abnormal coagulation profile: Secondary | ICD-10-CM | POA: Diagnosis not present

## 2024-03-10 DIAGNOSIS — C7989 Secondary malignant neoplasm of other specified sites: Secondary | ICD-10-CM | POA: Diagnosis not present

## 2024-03-10 DIAGNOSIS — R0689 Other abnormalities of breathing: Secondary | ICD-10-CM | POA: Diagnosis not present

## 2024-03-10 NOTE — Nursing Note (Signed)
   03/10/24 1120  Rapid Rounds  Attendance Nursing;Physician;Advanced Practice Provider (APP);Case Manager;Dietitian/Nutrition Services;Occupational Therapy;Pharmacy;Physical Therapy;Respiratory Therapy;Social Work/Services;Speech Language Pathology  Expected Discharge Disposition [facility] Services  Today we still await: Clinical stability   Will discharge home today. Will resume HH services and will need oxygen  at 2L nocturnal. Home concentrator ordered through [employer] and will be delivered to the patients home address. Patient will need to follow up with oncology.

## 2024-03-10 NOTE — Nursing Note (Signed)
 Discharged to home. IV removed, Discharged order placed for patient , discharge instructions were given, Transport via w/c to private. Patient left stable.

## 2024-03-10 NOTE — Discharge Summary (Signed)
 ------------------------------------------------------------------------------- Attestation signed by Rosena Tyronne Larger, MD at 03/10/24 1554 I have seen, examined, and chart reviewed for  [name] and supervised the nurse practitioner/PA in the care of the patient.  Relevant conversation regarding plan of care has been done.  I have discussed with the APP and agree with the assessment and plan.  Hzw:Jtjxz and alert. No distress Heent: wnl Heart: RRR Pulmonary: CTAB Abdomen:Soft, NT, ND Extremities:No edema   Tyronne Rosena, MD -------------------------------------------------------------------------------   [facility] [address]   Discharge date:   March 10, 2024 Length of stay:    LOS: 3 days    Discharge Service:   [facility] Hospitalists Discharge Attending Physician: No att. providers found Discharge to:    To Home with Home Health Condition at Discharge:  stable Code status:                         DNR and [facility]   Hospital Course: 9/28 Admitted for hypoxia, respiratory insufficiency.  Elevated D Dimer, placed on heparin  drip as we await imaging.  2L of oxygen .  IV antibx, IVF.    03/08/24 VQ scan negative for PE.  Remains on 2L, continue IV antibx.   03/09/24 Bilateral doppler of lower extremities.  IV iron  infusion. Hg 9.3.  ______________________________________  Admission HPI    Patient admitted on: 03/07/2024  5:43 PM  Patient admitted by: Vanessa Shelvy Lonni Verdie, MD    CHIEF COMPLAINT: Shortness of breath   Day of admission HPI:  [name]  is a 83 y.o. female with a PMH significant for melanoma, lumbar spondylosis COPD, DDD, HTN, HLD, pulmonary hypertension, T2DM, history of TIA, history of CVA who presented to the ED with acutely worsening shortness of breath over the last couple of hours prior to presentation to the ED.  While at home, the patient developed significant respiratory distress as patient noted noted to be  saturating at 88% at which time she was placed on supplemental oxygen  (2 L/min) by EMS.  The patient is slightly confused but denies any acute headache, blurred vision, nausea, vomiting, chest pain, palpitations, abdominal pain, diarrhea, persistent cough, supraduction, orthopnea or swelling.  Workup in the ED is significant for a D-dimer of 1205, discussed case with ED physician who given concern for possible PE with complicated history, started her on heparin  GTT, treatment dose for suspected PE.  Unable to obtain CTA at this time but but will hopefully be able to procure this tomorrow (9/29).  If unable, would plan for VQ scan.   Patient admitted on Home O2? - no Patient on home anticoagulant? -  no Patient admitted with Chronic home foley catheter? - no Foley catheter placed or replaced by another service prior to admission? - no Central Line Status: NONE and Central Line neck   Mental Status on Admission: The patient is not Alert and oriented to PERSON The patient is not Alert And oriented to TIME The patient is not Alert and oriented to LOCATION   Today; Pt is laying in bed awake and alert.  Her daughter is at the bedside feeding her breakfast.  States she feels good, slept well, and is hungry this morning.     Problem List, Assessment & Plan     ASSESSMENT & PLAN (In order of descending acuity)   Acute hypoxemic respiratory insufficiency, atypical PNA, possible PE (ruled out) VQ scan negative for PE (D dimer 1205); heparin  drip stopped COPD, was given  solu-medrol  in the ER, no further wheezing CT chest w/o contrast shows ground glass opacification bilaterally/atypical infection; RPP negative Covering empirically with Rocephin /Doxycycline , discharge on Augmentin X 5 days PO2 52 on admission, oxygen  88% on RA 94% on RA today; qualified for overnight oxygen  with overnight study 9/30-10/1 Due to recent initiation of Keytruda  will start PO prednisone  X 5 days to address possible  pneumonitis, rx for 3 days to start tomorrow   Hypokalemia, resolved K as low as 2.9 on 9/29, 4.0 on 10/1 Replete orally and recheck   COPD Possible exacerbation Continue nebs, oxygen  support, empiric antibx Has neb machine at home   HLD Continue statin   T2DM SSI, serial blood glucose monitoring and diabetic diet A1C 6.6 Glucose 102-270 in the past 24 hours   Metastatic Carcinoma Follows with [facility] Immunotherapy/Keytruda  Tuesday 9/23, was scheduled to get PAC placed 9/30 History of melanoma Bulky left axillary adenopathy and enlarged left external iliac chain lymph nodes noted on 9/28 CT   HFpEF, chronic Echo 9/29 shows EF > 55% Continue torsemide daily and losartan   CAD, previous CVA, left side residual weakness Continue Plavix , statin Uses wheelchair at baseline when she goes out of the house Home health to resume   Anemia Chronic, iron  deficiency In the setting of active metastatic cancer and immunotherapy Iron  21, give iron  infusion 9/30 to support hg Hg trend 12.7 on admission, 9.5 10/1; no active bleeding   Elevated D Dimer VQ scan negative for PE Bilateral lower extremity dopplers negative   Anticoagulation; Lovenox  Oxygen ; weaned from 2L, qualified for nighttime oxygen  IVF; stopped IV antibx; Rocephin  and Doxy, home with Augmentin X 5 days   Pt is medically stable, ready for discharge home.  Discharge plans discussed with the pt and also with her family at bedside.  Discussed picking up rx's at [facility].  Resume home health.  Discharge home with nighttime oxygen .     ADDITIONAL NON-ACUTE FINDINGS, OBSERVATIONS, FAMILY DISCUSSIONS, ETC. (When present):   General;  Ill appearing 83 year old female Cardiovascular; regular rhythm, rate 70s Pulmonary;  Lungs diminished, no wheezing, no crackles; 95% on RA, normal breathing effort Abdomen; soft, nontender, bowel sounds present all 4 quadrants Extremities; moves all extremities spontaneously, +1  edema, redness bilateral lower extremities with no open areas Neuro; alert, oriented, no focal deficit   Incidental Findings for outpatient Follow-Up: No significant incidental findings present    DVT Prophylaxis Ordered: heparin  drip, switched to Lovenox  ______________________________________  Mental Status On day of Discharge:  The patient is Alert and oriented to PERSON The patient is Alert And oriented to TIME The patient is Alert and oriented to LOCATION  CODE STATUS :                    DNR and DNI   An advanced care planning discussion was  had with patient and/or patient's decisions maker (documented separately).  Patient discharged on Home O2? - yes, nighttime only Patient discharged on home anticoagulant? -  no  Foley Catheter status: None Central Line Status: NONE  Time Spent on Discharge I spent greater than 30 minutes counseling and coordinating care for the discharge of this patient. The pt and her daughter and I discussed the importance of outpatient follow-up as well as concerning signs and symptoms that would require immediate evaluation by a medical professional. The aforementioned conversation participants understand  and did show insight. I did use teachback to ensure understanding. The above participant/s is aware  that not following the discussed plan, recommendations, and follow up can lead to severe negative effects on the patient's health, up to and including death.  Discharge Medications     Your Medication List     START taking these medications    amoxicillin-clavulanate 875-125 mg per tablet Commonly known as: AUGMENTIN Take 1 tablet by mouth two (2) times a day for 5 days.   predniSONE  20 MG tablet Commonly known as: DELTASONE  Take 2 tablets (40 mg total) by mouth in the morning for 3 days. Start taking on: March 11, 2024       CONTINUE taking these medications    acetaminophen  650 MG CR tablet Commonly known as: TYLENOL  8 HOUR Take  1 tablet (650 mg total) by mouth every eight (8) hours as needed.   albuterol 90 mcg/actuation inhaler Commonly known as: PROVENTIL HFA;VENTOLIN HFA Inhale 1 puff.   ascorbic acid  500 MG tablet Generic drug: ascorbic acid  (vitamin C ) Take 1 tablet (500 mg total) by mouth in the morning.   clopidogrel  75 mg tablet Commonly known as: PLAVIX  Take 1 tablet (75 mg total) by mouth in the morning.   Fish Oil 1,200 (144-216) mg Cap Generic drug: omega 3-dha-epa-fish oil Take 1,200 mg by mouth in the morning.   guaiFENesin 600 mg 12 hr tablet Commonly known as: MUCINEX Take 2 tablets (1,200 mg total) by mouth two (2) times a day.   ipratropium-albuterol 0.5-2.5 mg/3 mL nebulizer Commonly known as: DUO-NEB Inhale 3 mL by nebulization every six (6) hours as needed.   losartan 25 MG tablet Commonly known as: COZAAR TAKE 1 TABLET (25 MG TOTAL) BY MOUTH DAILY.   metFORMIN 500 MG tablet Commonly known as: GLUCOPHAGE Take 1 tablet (500 mg total) by mouth in the morning and 1 tablet (500 mg total) in the evening. Take with meals.   nitroglycerin 0.4 MG SL tablet Commonly known as: NITROSTAT Place 1 tablet (0.4 mg total) under the tongue every five (5) minutes as needed for chest pain. Maximum of 3 doses in 15 minutes.   omega-3 acid ethyl esters 1 gram capsule Commonly known as: LOVAZA  Take 2 capsules (2 g total) by mouth.   oxybutynin 5 MG 24 hr tablet Commonly known as: DITROPAN-XL Take 1 tablet (5 mg total) by mouth in the morning.   pravastatin 40 MG tablet Commonly known as: PRAVACHOL Take 1 tablet (40 mg total) by mouth in the morning.   sertraline 50 MG tablet Commonly known as: ZOLOFT Take 1 tablet (50 mg total) by mouth daily.   torsemide 20 MG tablet Commonly known as: DEMADEX Take 1 tablet (20 mg total) by mouth in the morning.   vitamin B-12 500 MCG tablet Generic drug: cyanocobalamin  Take 1 tablet (500 mcg total) by mouth in the morning.   VITAMIN D3 25 mcg  (1,000 unit) capsule Generic drug: cholecalciferol (vitamin D3-25 mcg (1,000 unit)) Take 1 capsule (25 mcg total) by mouth in the morning.       _____________________________________  Nutrition:                                  Diet Instructions     Discharge diet (specify)     Discharge Nutrition Therapy: Consistent Carb   Consistent Carb Level: Consistent Carb 60/60/60 (4/4/4)                     ___________________________________________  Discharge  Instructions   Nutrition:                                  Diet Instructions     Discharge diet (specify)     Discharge Nutrition Therapy: Consistent Carb   Consistent Carb Level: Consistent Carb 60/60/60 (4/4/4)       Activity:                                   Activity Instructions     Activity as tolerated         Appointments:                          Follow Up:                              Follow Up instructions and Outpatient Referrals    Ambulatory Referral to Palliative Care     Reason for referral: Palliative serious illness   Is this a palliative referral for:  symptom management goals of care coping support     Requested follow up plan: You would evaluate and manage.   Call MD for:  persistent nausea or vomiting     Call MD for:  severe uncontrolled pain     Call MD for: Temperature > 38.5 Celsius ( > 101.3 Fahrenheit)     Discharge instructions         Allergies  Allergen Reactions  . Tizanidine   . Ciprofloxacin Rash     Past Medical History[1]  Past Surgical History[2]   Family History[3]   Current Medications[4]  Imaging  PVL Venous Duplex Lower Extremity Bilateral Result Date: 03/09/2024 Exam:  Bilateral Lower Extremity DVT Ultrasound Exam  History:  Swelling  Technique:  Real-time grayscale compression ultrasound of both lower extremities was performed from the common femoral vein through the popliteal vein to include the proximal end of the greater saphenous vein. The calf  veins were also interrogated to the extent that they were visible. This examination was supplemented with color flow and Doppler interrogation.  Comparison:  None  Findings: The deep venous systems of both lower extremities are widely patent and compressible. There is no evidence of deep venous thrombosis. There is normal phasicity of flow with respiration of each the lower extremity. There is appropriate ipsilateral flow augmentation with calf compression. There is no popliteal fossa mass or cyst on either side.  While scanning the left groin, a complex solid-appearing mass is discovered with dimensions approaching 4.5 x 2.8 cm. Locations suggest potential markedly abnormal/enlarged lymph node. Clinical management warranted.    1.    Negative examination of the deep venous systems of  both lower extremities. There is no evidence of deep vein thrombosis. 2.    Large mass discovered left inguinal region warrants clinical management  Signed (Electronic Signature): 03/09/2024 4:10 PM Signed By: Dow JAYSON Agee, MD  Echocardiogram W Colorflow Spectral Doppler With Contrast Result Date: 03/08/2024 Patient Info Name:     Jillane Po Age:     68 years DOB:     01-Apr-1941 Gender:     Female MRN:     [id number] Accession #:     [id number] [facility] Account #:     [id number] Ht:  158 cm Wt:     92 kg BSA:     2.05 m2 BP:     115 /     76 mmHg HR:     86 bpm Exam Date:     03/08/2024 1:43 PM Admit Date:     03/07/2024 Exam Type:     ECHOCARDIOGRAM W COLORFLOW SPECTRAL DOPPLER W CONTRAST Technical Quality:     Poor Reason for Poor Study:     poor echocardiographic windows Staff Sonographer:     Mliss Gate Supervising Physician:     Earla Maude Currier MD Referring Physician:     Darin Shelvy Lonni [name] Ordering Physician:     Brutus Bryant Pace Study Info Indications      - hypoxia,  dyspnea,  pleural effusion ; Definity/Optison Procedure(s)   Complete two-dimensional, color flow and Doppler transthoracic  echocardiogram is performed with contrast to opacify the left ventricle and to improve the delineation of the left ventricle endocardial borders. Summary   1. Technically difficult study.   2. The left ventricular systolic function is normal, LVEF is visually estimated at > 55%.   3. There is a 5 x 5 cm hyperechoic echodensity on the aortic valve that was present on TTE 01/15/23 and likely represents calcified nodule. Left Ventricle   The left ventricular systolic function is normal, LVEF is visually estimated at > 55%. Left ventricular diastolic function cannot be accurately assessed. Right Ventricle   Right ventricle is not well visualized. Left Atrium   The left atrium is not well visualized. Right Atrium   The right atrium is not well visualized. Aortic Valve   The aortic valve is poorly visualized with probably normal excursion. There is a 5 x 5 cm hyperechoic echodensity on the aortic valve that was present on TTE 01/15/23 and likely represents calcified nodule. There is mild aortic regurgitation. Mitral Valve   The mitral valve leaflets are poorly visualized. There is no significant mitral valve regurgitation. Tricuspid Valve   The tricuspid valve leaflets are poorly visualized. There is no significant tricuspid regurgitation. Pulmonic Valve   The pulmonic valve is poorly visualized. There is no significant pulmonic regurgitation. Inferior Vena Cava   The IVC is not well visualized precluding the ability to accurate assess right atrial pressure. Ventricles ---------------------------------------------------------------------- Name                                 Value        Normal ---------------------------------------------------------------------- LV Dimensions 2D/MM ---------------------------------------------------------------------- LVID Diastole (2D)                  3.0 cm       3.8-5.2  LVPW Diastolic Thickness (2D)                                0.6 cm       0.6-0.9 LVID Systole (2D)                    1.6 cm       2.2-3.5 LVOT Diameter                       1.5 cm                Relative Wall Thickness (2D)  0.40        <=0.42 LV Function ---------------------------------------------------------------------- LV EF (4C MOD)                        77 %                LV Diastolic Volume Index (BP MOD)                         7.5 ml/m2     29.0-61.0 LV EF (BP MOD)                        81 %         54-74 Atria ---------------------------------------------------------------------- Name                                 Value        Normal ---------------------------------------------------------------------- LA Dimensions ---------------------------------------------------------------------- LA Volume Index (4C A-L)        35.80 ml/m2               RA Dimensions ---------------------------------------------------------------------- RA Area (4C)                      12.7 cm2        <=18.0 RA Area (4C) Index              6.2 cm2/m2               RA ESV Index (4C MOD)             13 ml/m2         15-27 Left Ventricular Outflow Tract ---------------------------------------------------------------------- Name                                 Value        Normal ---------------------------------------------------------------------- LVOT 2D ---------------------------------------------------------------------- LVOT Diameter                       1.5 cm               LVOT Area                          1.8 cm2 Aortic Valve ---------------------------------------------------------------------- Name                                 Value        Normal ---------------------------------------------------------------------- AV Doppler ---------------------------------------------------------------------- AV Peak Velocity                   1.8 m/s               AV Peak Gradient                   13 mmHg               AV Mean Gradient                    7 mmHg               AV VTI  31 cm Tricuspid Valve ---------------------------------------------------------------------- Name                                 Value        Normal ---------------------------------------------------------------------- TV Regurgitation Doppler ---------------------------------------------------------------------- TR Peak Velocity                   2.3 m/s Report Signatures Finalized by Earla Maude Currier  MD on 03/08/2024 04:00 PM  XR Chest 2 views Result Date: 03/08/2024 Exam:  Chest Two Views  History:  Hypoxemia  Technique:  2 views  Comparison:  Chest radiograph from yesterday  Findings:   Nondiagnostic lateral view.  Unchanged right-sided central venous catheter.   Mild bibasilar opacities.  No pneumothorax or demonstrable pleural effusion.  The cardiomediastinal silhouette is unchanged.  No acute osseous abnormality identified.     - Nondiagnostic lateral view.  - Mild bibasilar opacities, atelectasis versus infection.    Signed (Electronic Signature): 03/08/2024 2:57 PM Signed By: Reyes Going, MD  NM Lung Scan Perfusion Imaging Only Result Date: 03/08/2024 Exam:  Radionuclide Ventilation Perfusion Scan (perfusion only)  History:  Hypoxemia  Technique:   Radiopharmaceutical: 4.6 mCi of Tc-73m MAA was administered intravenously followed by planar anterior, posterior, lateral and oblique views for the perfusion portion of the examination.  Ventilation imaging not performed.  Comparison:  Chest x-ray 03/07/2024, chest CT 03/07/2024.  Findings:  VENTILATION:  Not performed  PERFUSION:  Relatively uniform perfusion of both lungs. No segmental defects identified.  CHEST X-RAY CORRELATION:  Chest x-ray shows low lung volumes with cardiomegaly. Allowing for technique, no dense, segmental consolidation.    1. Limited, perfusion only exam. 2. No segmental perfusion defects, very low probability for pulmonary embolism.  Signed (Electronic Signature): 03/08/2024 10:57 AM Signed By: Norleen Granville, MD  XR  Chest Portable Result Date: 03/07/2024 Exam:  Portable Chest  History:  Central line placement.  Comparison:  03/07/2024  Findings:   Right internal jugular central venous catheter tip extending to superior cavoatrial junction. Low lung volumes. Suspected asymmetric pulmonary edema. No definitive pleural effusion. No pneumothorax.  Cardiomediastinal contours are unchanged.  Suspected prominent osteophyte of the left humeral head. Correlation for symptoms in this area is recommended.  External monitoring wires overlie the patient.    1.    Right internal jugular central venous catheter tip extending to the superior cavoatrial junction. No pneumothorax. 2.    Suspected asymmetric pulmonary edema. 3.    Suspected prominent osteophyte of the left humeral head. Correlation for symptoms in this area is recommended.  Signed (Electronic Signature): 03/07/2024 7:42 PM Signed By: Lynwood Vaughan Calkins, MD  CT Abdomen Pelvis Wo Contrast Result Date: 03/07/2024 Exam:  CT of the Abdomen and Pelvis without Contrast  History:  Abdominal pain and sepsis.  Technique: Routine CT of the abdomen and pelvis without IV contrast. AEC (automated exposure control) and/or manual techniques such as size-specific kV and mAs are employed where appropriate to reduce radiation exposure for all CT exams.  Comparison:   April 26, 2020  Abdomen and Pelvis CT Findings:  LOWER THORAX:  Dependent atelectasis. Patchy groundglass opacities. Coronary artery calcifications. Bulky level 1 axillary adenopathy.  HEPATOBILIARY:  Unchanged morphology and contour of the liver. The gallbladder is nondilated and without associated inflammatory change. Multiple gallstones are noted.  PANCREAS:  Fatty infiltrated the region of the pancreatic head as previous. No acute findings.  SPLEEN:  Normal.  ADRENALS:  Normal.  KIDNEYS/URETERS:  No hydronephrosis or perinephric fluid collection. Benign appearing 4.3 cm right renal cyst as previous. No further renal  imaging evaluation is needed for simple or benign-appearing cysts. No radio opaque nephrolithiasis. Bladder is decompressed.  REPRODUCTIVE ORGANS:  No acute findings.  BOWEL/MESENTERY: The stomach is decompressed.  Small bowel is nondilated. A small duodenal diverticulum containing debris arises off of the second portion of the duodenum. No perienteric inflammatory change. The large bowel is normal in caliber. No large bowel wall thickening. The appendix is not definitively seen, no clinical signs of appendicitis.  PERITONEUM AND RETROPERITONEUM: No free air. No significant free fluid.  VASCULAR:  The aorta is not dilated.  LYMPH NODES:  There are prominent lymph nodes along the external iliac chains, this is a change from prior. There is a bulky left inguinal lymph node measuring 5 x 3.1 cm (image 148 of series 3). Additionally a enlarged left external iliac chain lymph node measures 3.3 x 1.8 cm (image 120 of series 3).  BONES/SOFT TISSUES:  Probable osteopenia. Multilevel degenerative disc disease. Postsurgical changes span L2-S1. This appearance is not significantly changed from previous    1.    No acute findings of the abdomen and pelvis to explain patient's clinical symptoms. 2.    Bulky left axillary adenopathy and enlarged left external iliac chain lymph nodes. Recommend correlation with clinical history and physical exam. Differential considerations include lymphoma. 3.    Cholelithiasis. 4.    Patchy groundglass opacities in the lung bases may represent subsegmental atelectasis.  Signed (Electronic Signature): 03/07/2024 7:40 PM Signed By: Ellouise Platt, MD  CT chest without contrast Result Date: 03/07/2024 Exam:  CT of the Chest without Contrast  History:  Shortness of breath. Hypoxia.  Technique: Routine chest CT without  IV contrast. AEC (automated exposure control) and/or manual techniques such as size-specific kV and mAs are employed where appropriate to reduce radiation exposure for all CT exams.   Comparison:   January 14, 2023  Chest CT Findings:  THORACIC INLET: No acute findings. Visualized thyroid  is unremarkable.  MEDIASTINUM:   No pneumomediastinum. No mediastinal adenopathy. There is new bulky left level 1 axillary adenopathy with a 4.2 x 3.4 cm lymph node (image 56 of series 3). No internal mammary adenopathy is seen.  CARDIOVASCULAR: Significant coronary artery vascular calcifications. Right-sided central venous catheter tip terminates in the SVC.SABRA The heart is normal in size. No significant pericardial effusion. Again seen is dilatation of the ascending aorta which currently measures 4.4 cm in diameter, previously 4.1 cm. The central pulmonary arteries are nondilated. The esophagus is decompressed.  TRACHEA AND CENTRAL AIRWAYS: Patent.  LUNGS/PLEURA:  Significant respiratory motion. Dependent opacities likely subsegmental atelectasis and/or scarring. Patchy groundglass opacification bilaterally.  No dense consolidative opacity with air bronchograms to suggest pneumonia. Occasional noncalcified nodular opacities subcentimeter in size appears similar to comparison study compatible with benignity.  UPPER ABDOMEN:  Cholelithiasis. Elevated right hemidiaphragm. No acute findings in the incidentally imaged upper abdomen.  BONES/SOFT TISSUES:  Bilateral breast implants. Please see above for description of axillary adenopathy. Degenerative changes of the shoulders. Cervical fusion hardware. No acute fracture or malalignment.     1. Patchy groundglass opacification bilaterally may be related to atelectasis or atypical infection. 2. New bulky left level 1 axillary adenopathy. Differential considerations include lymphoproliferative disorder and metastatic disease. Recommend nonemergent diagnostic mammogram and targeted ultrasound. 3. 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 4.Cholelithiasis.  Signed (  Electronic Signature): 03/07/2024 7:34 PM Signed By: Ellouise Platt,  MD  CT Head Wo Contrast Result Date: 03/07/2024 Exam: CT Brain without contrast.  History: Altered mental status.  Technique: Axial images of the head were obtained from the vertex through the skull base without the administration of contrast.  Sagittal and coronal reformatted images were obtained.   AEC (automated exposure control) and/or manual techniques such as size-specific kV and mAs are employed where appropriate to reduce radiation exposure for all CT exams.  Comparison: April 24, 2023  Findings: There is no acute intraparenchymal or extra-axial hemorrhage.  No CT findings of acute territorial infarction. No mass effect or midline shift. 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 Imaging findings similar to the comparison study. The ventricles are unchanged in size. No acute calvarial abnormality. The mastoid air cells are clear. The paranasal sinuses are well pneumatized. Mild ethmoid sinus mucosal thickening.    No acute intracranial abnormality by CT.  Signed (Electronic Signature): 03/07/2024 7:23 PM Signed By: Ellouise Platt, MD  XR Chest Portable Result Date: 03/07/2024 Exam:  Portable Chest  History:  Dyspnea  Comparison:  02/19/2024  Findings:    Low lung volumes. Possible small left pleural effusion. No focal consolidation. No pneumothorax. Cardiomediastinal contours are unchanged.  No acute osseous finding.  External monitoring wires overlie the patient.    Possible small left pleural effusion. Low lung volumes.  Signed (Electronic Signature): 03/07/2024 6:28 PM Signed By: Lynwood Vaughan Calkins, MD   Lab Results   Recent Labs    03/10/24 0448  WBC 4.5  HGB 9.5*  HCT 29.5*  PLT 192   Recent Labs    03/07/24 1755 03/08/24 0324 03/10/24 0448  NA 137   < > 140  K 3.3*   < > 4.0  CL 94*   < > 102  CO2 35.6*   < > 32.8*  BUN 11   < > 17  CREATININE 0.85   < > 0.62  GLU 141   < > 120  CALCIUM 9.2   < > 8.3*  ALBUMIN 3.8  --   --    PROT 7.8  --   --   BILITOT 0.5  --   --   AST 20  --   --   ALT 16  --   --   ALKPHOS 97  --   --   MG 1.8   < > 1.7  LIPASE 42  --   --   LACTATE 1.9  --   --    < > = values in this interval not displayed.   Recent Labs    03/07/24 1755  INR 1.17  DDIMER 1,205*   Recent Labs    03/07/24 1755  WBCUA 1  NITRITE Negative  LEUKOCYTESUR Negative  BACTERIA None Seen  RBCUA 4*  BLOODU Trace*  GLUCOSEU Negative  PROTEINUA Negative  KETONESU Negative   No results for input(s): OPIAU, BENZU, TRICYCLIC, PCPU, AMPHU, COCAU, CANNAU, BARBU, ETOH, ACETAMIN, SALICYLATE in the last 72 hours. No results for input(s): PREGTESTUR, PREGPOC in the last 72 hours. Recent Labs    03/08/24 0324  A1C 6.6*   Recent Labs    03/07/24 1830  PHART 7.52*  PCO2ART 41.5  PO2ART 52*  HCO3ART 33.7*  O2SATART 88.8*  BEART 9.9*   Pending Labs     Order Current Status   Blood Culture #1 Preliminary result   Blood Culture #2 Preliminary result  Home Medications   Prior to Admission medications  Medication Dose, Route, Frequency  acetaminophen  (TYLENOL  8 HOUR) 650 MG CR tablet 650 mg, Every 8 hours PRN  albuterol HFA 90 mcg/actuation inhaler 1 puff  ascorbic acid , vitamin C , (ASCORBIC ACID  WITH ROSE HIPS) 500 MG tablet 500 mg, Daily (standard)  cholecalciferol, vitamin D3, (VITAMIN D3) 1,000 unit capsule 1,000 Units, Daily (standard)  clopidogrel  (PLAVIX ) 75 mg tablet 75 mg, Daily (standard)  cyanocobalamin  (VITAMIN B-12) 500 MCG tablet 500 mcg, Daily (standard)  guaiFENesin (MUCINEX) 600 mg 12 hr tablet 1,200 mg, Oral, 2 times a day (standard)  ipratropium-albuterol (DUO-NEB) 0.5-2.5 mg/3 mL nebulizer 3 mL, Nebulization, Every 6 hours PRN  losartan (COZAAR) 25 MG tablet 25 mg, Oral, Daily (standard)  metFORMIN (GLUCOPHAGE) 500 MG tablet 500 mg, 2 times a day with meals  nitroglycerin (NITROSTAT) 0.4 MG SL tablet 0.4 mg, Sublingual, Every 5 min PRN,  Maximum of 3 doses in 15 minutes.  omega 3-dha-epa-fish oil (FISH OIL) 1,200 (144-216) mg cap 1,200 mg, Daily  omega-3 acid ethyl esters (LOVAZA ) 1 gram capsule 2 g  oxybutynin (DITROPAN-XL) 5 MG 24 hr tablet 5 mg, Daily (standard)  pravastatin (PRAVACHOL) 40 MG tablet 40 mg, Daily (standard)  sertraline (ZOLOFT) 50 MG tablet 50 mg, Daily (standard)  torsemide (DEMADEX) 20 MG tablet 20 mg, Daily (standard)  amoxicillin-clavulanate (AUGMENTIN) 875-125 mg per tablet 1 tablet, Oral, 2 times a day (standard)  predniSONE  (DELTASONE ) 20 MG tablet 40 mg, Oral, Daily   Hagan E Pace, FNP Hospitalist, [facility] 03/10/24, 3:18 PM       [1] Past Medical History: Diagnosis Date  . Cancer    (CMS-HCC)   . COPD (chronic obstructive pulmonary disease)    (CMS-HCC)   . Diabetes    (CMS-HCC)   . HTN (hypertension)   . Stroke    (CMS-HCC)   [2] Past Surgical History: Procedure Laterality Date  . ACDF    . KNEE SURGERY     knee replacements in both knees  . LEFT KNEE ARTHROSCOPY    . LEFT KNEE REPLACEMENT     . RIGHT KNEE ARTHROSCOPY    . SPINE SURGERY    [3] Family History Problem Relation Age of Onset  . Cancer Mother   . Heart attack Mother   . Hypertension Mother   . Heart attack Father   . Hypertension Father   . Heart failure Brother   . Heart disease Brother   [4] No current facility-administered medications for this encounter.  Current Outpatient Medications:  .  acetaminophen  (TYLENOL  8 HOUR) 650 MG CR tablet, Take 1 tablet (650 mg total) by mouth every eight (8) hours as needed., Disp: , Rfl:  .  albuterol HFA 90 mcg/actuation inhaler, Inhale 1 puff., Disp: , Rfl:  .  ascorbic acid , vitamin C , (ASCORBIC ACID  WITH ROSE HIPS) 500 MG tablet, Take 1 tablet (500 mg total) by mouth in the morning., Disp: , Rfl:  .  cholecalciferol, vitamin D3, (VITAMIN D3) 1,000 unit capsule, Take 1 capsule (25 mcg total) by mouth in the morning., Disp: , Rfl:  .  clopidogrel  (PLAVIX ) 75 mg tablet,  Take 1 tablet (75 mg total) by mouth in the morning., Disp: , Rfl:  .  cyanocobalamin  (VITAMIN B-12) 500 MCG tablet, Take 1 tablet (500 mcg total) by mouth in the morning., Disp: , Rfl:  .  guaiFENesin (MUCINEX) 600 mg 12 hr tablet, Take 2 tablets (1,200 mg total) by mouth two (2) times a day., Disp:  14 tablet, Rfl: 0 .  ipratropium-albuterol (DUO-NEB) 0.5-2.5 mg/3 mL nebulizer, Inhale 3 mL by nebulization every six (6) hours as needed., Disp: 360 mL, Rfl: 1 .  losartan (COZAAR) 25 MG tablet, TAKE 1 TABLET (25 MG TOTAL) BY MOUTH DAILY., Disp: 90 tablet, Rfl: 2 .  metFORMIN (GLUCOPHAGE) 500 MG tablet, Take 1 tablet (500 mg total) by mouth in the morning and 1 tablet (500 mg total) in the evening. Take with meals., Disp: , Rfl:  .  nitroglycerin (NITROSTAT) 0.4 MG SL tablet, Place 1 tablet (0.4 mg total) under the tongue every five (5) minutes as needed for chest pain. Maximum of 3 doses in 15 minutes., Disp: 25 tablet, Rfl: 0 .  omega 3-dha-epa-fish oil (FISH OIL) 1,200 (144-216) mg cap, Take 1,200 mg by mouth in the morning., Disp: , Rfl:  .  omega-3 acid ethyl esters (LOVAZA ) 1 gram capsule, Take 2 capsules (2 g total) by mouth., Disp: , Rfl:  .  oxybutynin (DITROPAN-XL) 5 MG 24 hr tablet, Take 1 tablet (5 mg total) by mouth in the morning., Disp: , Rfl:  .  pravastatin (PRAVACHOL) 40 MG tablet, Take 1 tablet (40 mg total) by mouth in the morning., Disp: , Rfl:  .  sertraline (ZOLOFT) 50 MG tablet, Take 1 tablet (50 mg total) by mouth daily., Disp: , Rfl:  .  torsemide (DEMADEX) 20 MG tablet, Take 1 tablet (20 mg total) by mouth in the morning., Disp: , Rfl:  .  amoxicillin-clavulanate (AUGMENTIN) 875-125 mg per tablet, Take 1 tablet by mouth two (2) times a day for 5 days., Disp: 10 tablet, Rfl: 0 .  [START ON 03/11/2024] predniSONE  (DELTASONE ) 20 MG tablet, Take 2 tablets (40 mg total) by mouth in the morning for 3 days., Disp: 6 tablet, Rfl: 0

## 2024-03-10 NOTE — Nursing Note (Signed)
 CVC removed , Pressure for 5 minutes , no bleeding

## 2024-03-11 ENCOUNTER — Telehealth: Payer: Self-pay

## 2024-03-11 DIAGNOSIS — R0902 Hypoxemia: Secondary | ICD-10-CM | POA: Diagnosis not present

## 2024-03-11 DIAGNOSIS — R0603 Acute respiratory distress: Secondary | ICD-10-CM | POA: Diagnosis not present

## 2024-03-11 DIAGNOSIS — J441 Chronic obstructive pulmonary disease with (acute) exacerbation: Secondary | ICD-10-CM | POA: Diagnosis not present

## 2024-03-11 DIAGNOSIS — R0689 Other abnormalities of breathing: Secondary | ICD-10-CM | POA: Diagnosis not present

## 2024-03-11 NOTE — Transitions of Care (Post Inpatient/ED Visit) (Signed)
   03/11/2024  Name: KYMIA SIMI MRN: [id number] DOB: 07-25-1940  Today's TOC FU Call Status: Today's TOC FU Call Status:: Unsuccessful Call (1st Attempt) Unsuccessful Call (1st Attempt) Date: 03/11/24  Attempted to reach the patient regarding the most recent Inpatient/ED visit.  Follow Up Plan: Additional outreach attempts will be made to reach the patient to complete the Transitions of Care (Post Inpatient/ED visit) call.   Lovel Suazo J. Arhum Peeples RN, MSN [facility], [facility] RN Care Manager Direct Dial: [phone]  Fax: [phone] Website: [email or web address]

## 2024-03-12 ENCOUNTER — Telehealth: Payer: Self-pay

## 2024-03-12 NOTE — Transitions of Care (Post Inpatient/ED Visit) (Signed)
   03/12/2024  Name: VELECIA OVITT MRN: [id number] DOB: 24-Jan-1941  Today's TOC FU Call Status: Today's TOC FU Call Status:: Unsuccessful Call (2nd Attempt) Unsuccessful Call (2nd Attempt) Date: 03/12/24  Attempted to reach the patient regarding the most recent Inpatient/ED visit.  Follow Up Plan: Additional outreach attempts will be made to reach the patient to complete the Transitions of Care (Post Inpatient/ED visit) call.   Zora Glendenning J. Gaston Dase RN, MSN [facility], [facility] RN Care Manager Direct Dial: [phone]  Fax: [phone] Website: [email or web address]

## 2024-03-15 ENCOUNTER — Telehealth: Payer: Self-pay

## 2024-03-15 NOTE — Transitions of Care (Post Inpatient/ED Visit) (Signed)
 03/15/2024  Name: [name] MRN: [id number] DOB: [birth year]  Today's TOC FU Call Status: Today's TOC FU Call Status:: Successful TOC FU Call Completed TOC FU Call Complete Date: 03/15/24 Patient's Name and Date of Birth confirmed.  Transition Care Management Follow-up Telephone Call.  Daughter at work and unable to complete the assessment.  Declined further follow up.   Date of Discharge: 03/10/24 Discharge Facility: Other [facility]) Name of Other ([facility]) Discharge Facility: [facility] Type of Discharge: Inpatient Admission Primary Inpatient Discharge Diagnosis:: COPD Exacerbation/Pneumonia How have you been since you were released from the hospital?: Better Any questions or concerns?: No  Items Reviewed: Did you receive and understand the discharge instructions provided?: Yes Medications obtained,verified, and reconciled?: Yes (Medications Reviewed) Any new allergies since your discharge?: No Dietary orders reviewed?: Yes Type of Diet Ordered:: Heart healthy Do you have support at home?: Yes People in Home [RPT]: child(ren), adult Name of Support/Comfort Primary Source: [facility]  Medications Reviewed Today: Medications Reviewed Today     Reviewed by Javonne Dorko, RN (Case Manager) on 03/15/24 at [phone]  Med List Status: <None>   Medication Order Taking? Sig Documenting Provider Last Dose Status Informant  albuterol (VENTOLIN HFA) 108 (90 Base) MCG/ACT inhaler [id number] Yes Inhale 1 puff into the lungs every 4 (four) hours as needed for shortness of breath. [provider]  Active Self  amoxicillin-clavulanate (AUGMENTIN) 875-125 MG tablet [id number] Yes Take 1 tablet by mouth 2 (two) times daily. For 5 days. [provider]  Active   aspirin  EC 81 MG tablet [id number]  Take 81 mg by mouth daily.  Patient not taking: Reported on 02/10/2024   [provider]  Active Self  benazepril -hydrochlorthiazide (LOTENSIN  HCT) 20-25 MG tablet  [id number]  Hold this medication until you follow up with PCP because your blood pressure was too low.  Patient not taking: Reported on 02/10/2024   Awanda City, MD  Active Self  Cholecalciferol (VITAMIN D3) 25 MCG (1000 UT) CAPS [id number]  Take 1 capsule by mouth daily at 6 (six) AM. [provider]  Active   clopidogrel  (PLAVIX ) 75 MG tablet [id number] Yes Take 75 mg by mouth daily. [provider]  Active            Med Note JAUN COREAN HERO   Thu May 20, 2019  9:54 AM)    fish oil-omega-3 fatty acids 1000 MG capsule [id number] Yes Take 2 g by mouth daily. [provider]  Active Self  guaiFENesin (MUCINEX) 600 MG 12 hr tablet [id number]  Take 600 mg by mouth 2 (two) times daily. [provider]  Active   hydrochlorothiazide  (HYDRODIURIL ) 25 MG tablet [id number]  Take by mouth.  Patient not taking: Reported on 02/10/2024   [provider]  Active Self  ipratropium-albuterol (DUONEB) 0.5-2.5 (3) MG/3ML SOLN [id number] Yes Take 3 mLs by nebulization every 6 (six) hours as needed. [provider]  Active   labetalol  (NORMODYNE ) 300 MG tablet [id number]  Take 300 mg by mouth 2 (two) times daily.  Patient not taking: Reported on 02/10/2024   [provider]  Active Self  lidocaine -prilocaine  (EMLA ) cream [id number] Yes Apply 1 Application topically as needed. Tina Pauletta BROCKS, MD  Active            Med Note PINKY, BESSIE R   Fri Feb 06, 2024  9:54 AM) Has not started yet  lisinopril  (ZESTRIL ) 20 MG tablet [id number]  Take by mouth.  Patient not taking:  Reported on 02/10/2024   [provider]  Active Self  LOSARTAN POTASSIUM PO [id number] Yes Take 25 mg by mouth daily. [provider]  Active   metFORMIN (GLUCOPHAGE) 500 MG tablet [id number] Yes Take 1 tablet by mouth 2 (two) times daily. [provider]  Active Self  Multiple Vitamin (MULTIVITAMIN) tablet [id number] Yes Take 1 tablet by mouth daily. [provider]   Active Self  Multiple Vitamins-Minerals (VITAMIN D3 COMPLETE PO) [id number]  Take 1 tablet by mouth daily.  Patient not taking: Reported on 02/10/2024   [provider]  Active   NIFEdipine (ADALAT CC) 30 MG 24 hr tablet [id number]  Take by mouth.  Patient not taking: Reported on 02/10/2024   [provider]  Active Self  nitroGLYCERIN (NITROSTAT) 0.4 MG SL tablet [id number] Yes Place 0.4 mg under the tongue every 5 (five) minutes as needed for chest pain. [provider]  Active   ondansetron  (ZOFRAN ) 8 MG tablet [id number] Yes Take 1 tablet (8 mg total) by mouth every 8 (eight) hours as needed for nausea or vomiting. Tina Pauletta BROCKS, MD  Active   pravastatin (PRAVACHOL) 40 MG tablet [id number] Yes Take 40 mg by mouth daily. [provider]  Active Self  prochlorperazine  (COMPAZINE ) 10 MG tablet [id number] Yes Take 1 tablet (10 mg total) by mouth every 6 (six) hours as needed for nausea or vomiting. Tina Pauletta BROCKS, MD  Active   sertraline (ZOLOFT) 50 MG tablet [id number] Yes Take 50 mg by mouth daily. [provider]  Active   tiZANidine (ZANAFLEX) 2 MG tablet [id number]  Take by mouth.  Patient not taking: Reported on 02/10/2024   [provider]  Active Self  TORSEMIDE PO [id number] Yes Take 20 mg by mouth daily. [provider]  Active   vitamin B-12 (CYANOCOBALAMIN ) 100 MCG tablet [id number] Yes Take 100 mcg by mouth daily. [provider]  Active Self  vitamin C  (ASCORBIC ACID ) 500 MG tablet [id number] Yes Take 500 mg by mouth daily. [provider]  Active   VITAMIN E  PO [id number]  Take 1 tablet by mouth daily.  Patient not taking: Reported on 02/10/2024   [provider]  Active             Home Care and Equipment/Supplies: Were Home Health Services Ordered?: Yes Name of Home Health Agency:: [facility] Has Agency set up a time to come to your home?: Yes First Home Health Visit Date:  (Last  week)  Functional Questionnaire: Do you need assistance with bathing/showering or dressing?: Yes (Family provides assistance with all aspects of care) Do you need assistance with meal preparation?: Yes Do you need assistance with eating?: Yes Do you have difficulty maintaining continence: Yes Do you need assistance with getting out of bed/getting out of a chair/moving?: Yes Do you have difficulty managing or taking your medications?: Yes  Follow up appointments reviewed: PCP Follow-up appointment confirmed?: Yes Date of PCP follow-up appointment?:  (Daughter states follow up is this week) [facility] Follow-up appointment confirmed?: NA Do you need transportation to your follow-up appointment?: No Do you understand care options if your condition(s) worsen?: Yes-patient verbalized understanding    Gracielynn Birkel DOROTHA Seeds RN, MSN [facility]  [facility], [facility] RN Care Manager Direct Dial: [phone]  Fax: [phone] Website: [email or web address]

## 2024-03-15 NOTE — Patient Instructions (Signed)
 Visit Information  Thank you for taking time to visit with me today.   Reviewed importance of medication compliance. Reviewed when to call for physician for increased shortness of breath, increased sputum, fatigue that is not normal, cough, fever or sick contacts.    The patient verbalized understanding of instructions, educational materials, and care plan provided today and DECLINED offer to receive copy of patient instructions, educational materials, and care plan.   The patient has been provided with contact information for the care management team and has been advised to call with any health related questions or concerns.   Please call the care guide team at [phone] if you need to cancel or reschedule your appointment.   Please call the Suicide and Crisis Lifeline: 988 if you are experiencing a Mental Health or Behavioral Health Crisis or need someone to talk to.  Taralyn Ferraiolo J. Denorris Reust RN, MSN [facility], [facility] Health RN Care Manager Direct Dial: [phone]  Fax: [phone] Website: [email or web address]  01

## 2024-03-17 DIAGNOSIS — I10 Essential (primary) hypertension: Secondary | ICD-10-CM | POA: Diagnosis not present

## 2024-03-17 DIAGNOSIS — E1122 Type 2 diabetes mellitus with diabetic chronic kidney disease: Secondary | ICD-10-CM | POA: Diagnosis not present

## 2024-03-17 DIAGNOSIS — E7849 Other hyperlipidemia: Secondary | ICD-10-CM | POA: Diagnosis not present

## 2024-03-17 DIAGNOSIS — N182 Chronic kidney disease, stage 2 (mild): Secondary | ICD-10-CM | POA: Diagnosis not present

## 2024-03-17 DIAGNOSIS — D692 Other nonthrombocytopenic purpura: Secondary | ICD-10-CM | POA: Diagnosis not present

## 2024-03-17 DIAGNOSIS — Z8673 Personal history of transient ischemic attack (TIA), and cerebral infarction without residual deficits: Secondary | ICD-10-CM | POA: Diagnosis not present

## 2024-03-17 DIAGNOSIS — J4540 Moderate persistent asthma, uncomplicated: Secondary | ICD-10-CM | POA: Diagnosis not present

## 2024-03-17 DIAGNOSIS — D0372 Melanoma in situ of left lower limb, including hip: Secondary | ICD-10-CM | POA: Diagnosis not present

## 2024-03-17 DIAGNOSIS — J9610 Chronic respiratory failure, unspecified whether with hypoxia or hypercapnia: Secondary | ICD-10-CM | POA: Diagnosis not present

## 2024-03-18 ENCOUNTER — Telehealth: Payer: Self-pay

## 2024-03-18 ENCOUNTER — Inpatient Hospital Stay: Admission: RE | Admit: 2024-03-18 | Discharge: 2024-03-18 | Disposition: A | Payer: Self-pay | Source: Ambulatory Visit

## 2024-03-18 ENCOUNTER — Inpatient Hospital Stay
Admission: RE | Admit: 2024-03-18 | Discharge: 2024-03-18 | Disposition: A | Discharge status: Home / Self Care | Payer: Self-pay | Source: Ambulatory Visit

## 2024-03-18 DIAGNOSIS — C439 Malignant melanoma of skin, unspecified: Secondary | ICD-10-CM

## 2024-03-18 NOTE — Telephone Encounter (Signed)
 [name] I ordered a CT outside body scan and chest to be uploaded.  Would you please call radiology to have her imaging from 9/28 from [facility] be uploaded into our system thank you.

## 2024-03-18 NOTE — Telephone Encounter (Signed)
 Called but no answer. Trying to see how she is doing. Advised to call if any questions.

## 2024-03-19 ENCOUNTER — Telehealth: Payer: Self-pay

## 2024-03-19 NOTE — Telephone Encounter (Signed)
 Spoke with Romero she will have the CT scans put into PACS and pushed over. Will call our radiology to be aware to receive them. Arland Legions BSN RN

## 2024-03-19 NOTE — Telephone Encounter (Signed)
 Spoke with the daughter patient is becoming more weak, having trouble eating has to fed. Wants to sleep all the time. She has had one immunotherapy treatment was told by PCP that, that was the reason for her condition. Has been placed on full time 02 by PCP. Has been in the hospital 2 times in the last couple of weeks. Discussed with the daughter that at her age and condition it takes about a week per day to recover from bed rest. Advised her to get her out of bed daily she is unable to walk due to as stroke, so advised to get her up in her W/C to just get her out of bed. Daughter VU will discuss situation with Dr Tina for his input. Arland Legions BSN RN

## 2024-03-22 ENCOUNTER — Telehealth: Payer: Self-pay

## 2024-03-22 NOTE — Telephone Encounter (Signed)
 Called and spoke to daughter Holley.  Report patient has been to the hospital twice since first treatment.  Report that she has profound weakness, trial of steroid for possible pneumonitis did not help.  Repeat steroid did not help.  She has profound weakness and decreased performance status.  She now needs more assistance.  Hospice was recommended when she was discharged.  Hospice nurse came to evaluate her last week and recommended she is appropriate for hospice.  Discussed given her current state, further treatment is not recommended at this time.  Hospice is reasonable.  If she were to improve, she may return for reassessment.  She will talk to the patient and hospice team tomorrow.  In the meantime, we will cancel infusion and port placement next week.  I am happy to see her again though without infusion for treatment if she would like to discuss on Thursday.  Please cancel IR port and infusion this week.

## 2024-03-25 ENCOUNTER — Other Ambulatory Visit (HOSPITAL_COMMUNITY)

## 2024-03-25 ENCOUNTER — Ambulatory Visit (HOSPITAL_COMMUNITY)

## 2024-03-26 ENCOUNTER — Telehealth: Payer: Self-pay

## 2024-03-26 ENCOUNTER — Inpatient Hospital Stay

## 2024-03-26 NOTE — Telephone Encounter (Signed)
 Called and left message to [facility] the primary contact because Denajah did not show up.  She was going to meet with hospice earlier this week.  Left VM to let us  know if any updates or follow-up may be needed.

## 2024-03-26 NOTE — Progress Notes (Deleted)
 Infusion cancelled  see Dr Tina

## 2024-03-26 NOTE — Telephone Encounter (Signed)
 Left VM on daughters phone it was not identified so just asked if her mother was okay and a phone number to call back. Arland Legions BSN RN

## 2024-04-11 DIAGNOSIS — J441 Chronic obstructive pulmonary disease with (acute) exacerbation: Secondary | ICD-10-CM | POA: Diagnosis not present

## 2024-04-11 DIAGNOSIS — R0603 Acute respiratory distress: Secondary | ICD-10-CM | POA: Diagnosis not present

## 2024-04-11 DIAGNOSIS — R0902 Hypoxemia: Secondary | ICD-10-CM | POA: Diagnosis not present

## 2024-04-11 DIAGNOSIS — R0689 Other abnormalities of breathing: Secondary | ICD-10-CM | POA: Diagnosis not present

## 2024-04-16 ENCOUNTER — Telehealth: Payer: Self-pay | Admitting: *Deleted

## 2024-04-16 ENCOUNTER — Inpatient Hospital Stay

## 2024-04-16 ENCOUNTER — Other Ambulatory Visit: Payer: Self-pay

## 2024-04-16 NOTE — Telephone Encounter (Signed)
 Daughter states she had a stroke about 3 weeks ago. She is bedridden and Hospice is coming twice a week. Discontinuing all treatment

## 2024-04-16 NOTE — Progress Notes (Deleted)
  [facility] OFFICE PROGRESS NOTE  Patient Care Team: Orpha Yancey LABOR, MD as PCP - General (Unknown Physician Specialty) Charls Pearla LABOR, MD (Inactive) as Attending Physician (Cardiology) Gaither Anes, MD as Attending Physician (Neurosurgery)  [name] is a 83 y.o.female with history of melanoma, cSCC, HTN, HLD, DJD, CAD, HFpEF, arthritis [facility]-up for 2 malignancies.    Left axilla biopsy confirmed metastatic melanoma. 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   Received 1 dose of pembro and admitted for hypoxia, respiratory insufficiency.  Assessment & Plan   No orders of the defined types were placed in this encounter.    Vanessa JAYSON Chihuahua, MD  INTERVAL HISTORY: Patient returns for follow-up.  Oncology History  Melanoma of skin (HCC)  01/22/2024 Initial Diagnosis   Melanoma of skin (HCC)   03/02/2024 -  Chemotherapy   Patient is on Treatment Plan : MELANOMA Pembrolizumab  (200) q21d        PHYSICAL EXAMINATION: ECOG PERFORMANCE STATUS: {CHL ONC ECOG [phone]}  There were no vitals filed for this visit. There were no vitals filed for this visit.  GENERAL: alert, no distress and comfortable SKIN: skin color normal and no jaundice or bruising or petechiae on exposed skin EYES: normal, sclera clear OROPHARYNX: no exudate  NECK: No palpable mass LYMPH:  no palpable cervical, axillary lymphadenopathy  LUNGS: clear to auscultation and no wheeze or rales with normal breathing effort HEART: regular rate & rhythm  ABDOMEN: abdomen soft, non-tender and nondistended. Musculoskeletal: no edema NEURO: no focal motor/sensory deficits  Relevant data reviewed during this visit included labs.  New labs ordered.

## 2024-06-21 ENCOUNTER — Telehealth: Payer: Self-pay | Admitting: Genetic Counselor

## 2024-06-21 NOTE — Telephone Encounter (Signed)
 Molecular Testing Review Molecular test report from [facility] reviewed by dentist.   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   Please discuss with patient and refer to genetic counseling if interested.   Burnard Ogren, MS, [facility] Licensed, [email or web address] phone: [phone]

## 2024-07-09 ENCOUNTER — Telehealth: Payer: Self-pay

## 2024-07-09 NOTE — Telephone Encounter (Signed)
 I called and spoke to daughter, Holley.  She gave me the update on [place].  Clinically she is recovering well from hospitalization.  She is still not able to walk, and her PS is fairly poor.  Mentation appears back to baseline since her stroke several years ago.  Overall is better compared to few months ago.  She is in hospice currently.  Discussed this is reasonable given her current functional status and keeping her comfortable.  We talk about her NGS showing mutations that may impact her family.  Given her findings, first-degree family member like her can be tested.  There may be implications if tested positive.  For example, melanoma risk may be increased and should be seeing a dermatologist for full-body skin exam for example.  If she would like to have consultation with genetic counselor, she may call us  in the future.  She understands.

## 2024-07-09 NOTE — Telephone Encounter (Signed)
 Duplicate encounter
